# Patient Record
Sex: Female | Born: 1955
Health system: Southern US, Community
[De-identification: ages and names within clinical notes are randomized; demographics above are authoritative.]

## PROBLEM LIST (undated history)

## (undated) DIAGNOSIS — F329 Major depressive disorder, single episode, unspecified: Secondary | ICD-10-CM

## (undated) DIAGNOSIS — L821 Other seborrheic keratosis: Secondary | ICD-10-CM

## (undated) DIAGNOSIS — E119 Type 2 diabetes mellitus without complications: Secondary | ICD-10-CM

## (undated) DIAGNOSIS — J45909 Unspecified asthma, uncomplicated: Secondary | ICD-10-CM

## (undated) DIAGNOSIS — L820 Inflamed seborrheic keratosis: Secondary | ICD-10-CM

## (undated) DIAGNOSIS — R072 Precordial pain: Secondary | ICD-10-CM

## (undated) DIAGNOSIS — K759 Inflammatory liver disease, unspecified: Secondary | ICD-10-CM

## (undated) DIAGNOSIS — R0600 Dyspnea, unspecified: Secondary | ICD-10-CM

## (undated) DIAGNOSIS — G4730 Sleep apnea, unspecified: Secondary | ICD-10-CM

## (undated) DIAGNOSIS — I509 Heart failure, unspecified: Secondary | ICD-10-CM

## (undated) DIAGNOSIS — G4733 Obstructive sleep apnea (adult) (pediatric): Secondary | ICD-10-CM

## (undated) DIAGNOSIS — Z8719 Personal history of other diseases of the digestive system: Secondary | ICD-10-CM

## (undated) DIAGNOSIS — Z419 Encounter for procedure for purposes other than remedying health state, unspecified: Secondary | ICD-10-CM

## (undated) DIAGNOSIS — I428 Other cardiomyopathies: Secondary | ICD-10-CM

## (undated) DIAGNOSIS — C50219 Malignant neoplasm of upper-inner quadrant of unspecified female breast: Secondary | ICD-10-CM

## (undated) DIAGNOSIS — F32A Depression, unspecified: Secondary | ICD-10-CM

## (undated) DIAGNOSIS — F411 Generalized anxiety disorder: Secondary | ICD-10-CM

## (undated) HISTORY — DX: Generalized anxiety disorder: F41.1

## (undated) HISTORY — DX: Precordial pain: R07.2

## (undated) HISTORY — DX: Other cardiomyopathies: I42.8

## (undated) HISTORY — PX: COSMETIC SURGERY: SHX468

## (undated) HISTORY — DX: Encounter for procedure for purposes other than remedying health state, unspecified: Z41.9

## (undated) HISTORY — PX: TONSILLECTOMY: SUR1361

## (undated) HISTORY — DX: Obstructive sleep apnea (adult) (pediatric): G47.33

## (undated) HISTORY — DX: Malignant neoplasm of upper-inner quadrant of unspecified female breast: C50.219

## (undated) HISTORY — DX: Inflamed seborrheic keratosis: L82.0

## (undated) HISTORY — DX: Other seborrheic keratosis: L82.1

---
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
## 2001-03-12 DIAGNOSIS — C50219 Malignant neoplasm of upper-inner quadrant of unspecified female breast: Secondary | ICD-10-CM

## 2001-03-12 HISTORY — DX: Malignant neoplasm of upper-inner quadrant of unspecified female breast: C50.219

## 2001-03-12 HISTORY — PX: BREAST LUMPECTOMY: SHX2

## 2005-03-01 DIAGNOSIS — Z853 Personal history of malignant neoplasm of breast: Secondary | ICD-10-CM

## 2005-03-01 DIAGNOSIS — G2581 Restless legs syndrome: Secondary | ICD-10-CM

## 2005-03-01 DIAGNOSIS — F411 Generalized anxiety disorder: Secondary | ICD-10-CM | POA: Insufficient documentation

## 2005-03-01 DIAGNOSIS — Z800 Family history of malignant neoplasm of digestive organs: Secondary | ICD-10-CM

## 2005-03-01 DIAGNOSIS — K580 Irritable bowel syndrome with diarrhea: Secondary | ICD-10-CM | POA: Insufficient documentation

## 2005-03-01 DIAGNOSIS — A6000 Herpesviral infection of urogenital system, unspecified: Secondary | ICD-10-CM

## 2005-03-01 DIAGNOSIS — Z8249 Family history of ischemic heart disease and other diseases of the circulatory system: Secondary | ICD-10-CM

## 2005-03-01 DIAGNOSIS — J4520 Mild intermittent asthma, uncomplicated: Secondary | ICD-10-CM

## 2005-03-01 DIAGNOSIS — K589 Irritable bowel syndrome without diarrhea: Secondary | ICD-10-CM

## 2005-03-01 DIAGNOSIS — L719 Rosacea, unspecified: Secondary | ICD-10-CM | POA: Insufficient documentation

## 2005-03-01 HISTORY — DX: Personal history of malignant neoplasm of breast: Z85.3

## 2005-03-01 HISTORY — DX: Family history of malignant neoplasm of digestive organs: Z80.0

## 2005-03-01 HISTORY — DX: Mild intermittent asthma, uncomplicated: J45.20

## 2005-03-01 HISTORY — DX: Family history of ischemic heart disease and other diseases of the circulatory system: Z82.49

## 2005-03-01 HISTORY — DX: Herpesviral infection of urogenital system, unspecified: A60.00

## 2005-03-01 HISTORY — DX: Irritable bowel syndrome, unspecified: K58.9

## 2005-03-01 HISTORY — DX: Restless legs syndrome: G25.81

## 2005-03-01 HISTORY — DX: Generalized anxiety disorder: F41.1

## 2005-03-01 HISTORY — DX: Rosacea, unspecified: L71.9

## 2010-09-27 DIAGNOSIS — Z419 Encounter for procedure for purposes other than remedying health state, unspecified: Secondary | ICD-10-CM

## 2010-09-27 DIAGNOSIS — Z0000 Encounter for general adult medical examination without abnormal findings: Secondary | ICD-10-CM | POA: Insufficient documentation

## 2010-09-27 HISTORY — DX: Encounter for procedure for purposes other than remedying health state, unspecified: Z41.9

## 2012-08-08 DIAGNOSIS — L820 Inflamed seborrheic keratosis: Secondary | ICD-10-CM

## 2012-08-08 DIAGNOSIS — L570 Actinic keratosis: Secondary | ICD-10-CM | POA: Insufficient documentation

## 2012-08-08 HISTORY — DX: Inflamed seborrheic keratosis: L82.0

## 2012-09-22 DIAGNOSIS — Z0000 Encounter for general adult medical examination without abnormal findings: Secondary | ICD-10-CM | POA: Insufficient documentation

## 2012-09-22 HISTORY — DX: Encounter for general adult medical examination without abnormal findings: Z00.00

## 2012-11-19 DIAGNOSIS — F419 Anxiety disorder, unspecified: Secondary | ICD-10-CM | POA: Insufficient documentation

## 2012-11-19 DIAGNOSIS — F411 Generalized anxiety disorder: Secondary | ICD-10-CM

## 2012-11-19 HISTORY — DX: Generalized anxiety disorder: F41.1

## 2013-05-15 DIAGNOSIS — F988 Other specified behavioral and emotional disorders with onset usually occurring in childhood and adolescence: Secondary | ICD-10-CM | POA: Insufficient documentation

## 2013-05-15 DIAGNOSIS — F419 Anxiety disorder, unspecified: Secondary | ICD-10-CM | POA: Insufficient documentation

## 2013-05-15 HISTORY — DX: Other specified behavioral and emotional disorders with onset usually occurring in childhood and adolescence: F98.8

## 2015-01-27 DIAGNOSIS — N959 Unspecified menopausal and perimenopausal disorder: Secondary | ICD-10-CM

## 2015-01-27 DIAGNOSIS — Z780 Asymptomatic menopausal state: Secondary | ICD-10-CM | POA: Insufficient documentation

## 2015-01-27 DIAGNOSIS — M8580 Other specified disorders of bone density and structure, unspecified site: Secondary | ICD-10-CM | POA: Insufficient documentation

## 2015-01-27 DIAGNOSIS — G8929 Other chronic pain: Secondary | ICD-10-CM

## 2015-01-27 DIAGNOSIS — M545 Low back pain, unspecified: Secondary | ICD-10-CM

## 2015-01-27 DIAGNOSIS — N898 Other specified noninflammatory disorders of vagina: Secondary | ICD-10-CM

## 2015-01-27 DIAGNOSIS — E785 Hyperlipidemia, unspecified: Secondary | ICD-10-CM | POA: Diagnosis present

## 2015-01-27 HISTORY — DX: Other specified disorders of bone density and structure, unspecified site: M85.80

## 2015-01-27 HISTORY — DX: Other specified noninflammatory disorders of vagina: N89.8

## 2015-01-27 HISTORY — DX: Unspecified menopausal and perimenopausal disorder: N95.9

## 2015-01-27 HISTORY — DX: Other chronic pain: G89.29

## 2015-01-27 HISTORY — DX: Low back pain, unspecified: M54.50

## 2015-04-25 DIAGNOSIS — L4050 Arthropathic psoriasis, unspecified: Secondary | ICD-10-CM

## 2015-04-25 HISTORY — DX: Arthropathic psoriasis, unspecified: L40.50

## 2015-05-24 DIAGNOSIS — I358 Other nonrheumatic aortic valve disorders: Secondary | ICD-10-CM | POA: Insufficient documentation

## 2015-05-24 HISTORY — DX: Other nonrheumatic aortic valve disorders: I35.8

## 2015-11-01 DIAGNOSIS — I428 Other cardiomyopathies: Secondary | ICD-10-CM | POA: Insufficient documentation

## 2015-11-01 DIAGNOSIS — R079 Chest pain, unspecified: Secondary | ICD-10-CM | POA: Insufficient documentation

## 2015-11-01 DIAGNOSIS — R072 Precordial pain: Secondary | ICD-10-CM

## 2015-11-01 HISTORY — DX: Precordial pain: R07.2

## 2015-11-01 HISTORY — DX: Other cardiomyopathies: I42.8

## 2016-03-12 DIAGNOSIS — J189 Pneumonia, unspecified organism: Secondary | ICD-10-CM

## 2016-03-12 HISTORY — DX: Pneumonia, unspecified organism: J18.9

## 2016-07-13 DIAGNOSIS — I10 Essential (primary) hypertension: Secondary | ICD-10-CM | POA: Insufficient documentation

## 2016-07-13 DIAGNOSIS — I350 Nonrheumatic aortic (valve) stenosis: Secondary | ICD-10-CM

## 2016-07-13 DIAGNOSIS — E1159 Type 2 diabetes mellitus with other circulatory complications: Secondary | ICD-10-CM | POA: Insufficient documentation

## 2016-07-13 HISTORY — DX: Essential (primary) hypertension: I10

## 2016-07-13 HISTORY — DX: Nonrheumatic aortic (valve) stenosis: I35.0

## 2016-09-10 DIAGNOSIS — L821 Other seborrheic keratosis: Secondary | ICD-10-CM

## 2016-09-10 HISTORY — DX: Other seborrheic keratosis: L82.1

## 2017-11-01 ENCOUNTER — Ambulatory Visit (INDEPENDENT_AMBULATORY_CARE_PROVIDER_SITE_OTHER): Payer: BLUE CROSS/BLUE SHIELD | Admitting: Cardiology

## 2017-11-01 ENCOUNTER — Encounter: Payer: Self-pay | Admitting: Cardiology

## 2017-11-01 VITALS — BP 122/68 | HR 72 | Ht 62.0 in | Wt 139.4 lb

## 2017-11-01 DIAGNOSIS — I428 Other cardiomyopathies: Secondary | ICD-10-CM | POA: Diagnosis not present

## 2017-11-01 DIAGNOSIS — R0609 Other forms of dyspnea: Secondary | ICD-10-CM

## 2017-11-01 DIAGNOSIS — R0600 Dyspnea, unspecified: Secondary | ICD-10-CM

## 2017-11-01 DIAGNOSIS — C50219 Malignant neoplasm of upper-inner quadrant of unspecified female breast: Secondary | ICD-10-CM | POA: Insufficient documentation

## 2017-11-01 DIAGNOSIS — F411 Generalized anxiety disorder: Secondary | ICD-10-CM

## 2017-11-01 HISTORY — DX: Dyspnea, unspecified: R06.00

## 2017-11-01 HISTORY — DX: Other forms of dyspnea: R06.09

## 2017-11-01 HISTORY — DX: Malignant neoplasm of upper-inner quadrant of unspecified female breast: C50.219

## 2017-11-01 NOTE — Progress Notes (Signed)
Cardiology Consultation:    Date:  11/01/2017   ID:  Lindsay Guerrero, DOB [birth year], MRN [id number]  PCP:  Patient, No Pcp Per  Cardiologist:  Jenne Campus, MD   Referring MD: No ref. provider found   No chief complaint on file. I would like to be established as a patient  History of Present Illness:    Lindsay Guerrero is a 62 y.o. female who is being seen today for the evaluation of history of cardiomyopathy at the request of No ref. provider found.  In 2017 she started experiencing dyspnea on exertion.  She eventually ended up going to the hospital.  She was find to have significant cardiomyopathy with according to her ejection fraction close to 30%.  After that she had a cardiac catheterization cardiac catheterization showed normal coronaries with ejection fraction 40%.  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  And she told me that her ejection fraction the time was 47%.  She seems to be doing well denies have any chest pain tightness squeezing pressure burning chest.  Still gets some exertional fatigue and tiredness.  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  She works there.  Past Medical History:  Diagnosis Date  . Anxiety state 11/19/2012   ICD10  . Elective surgery for purposes other than treating health conditions 09/27/2010   ICD10  . Inflamed seborrheic keratosis 08/08/2012  . Malignant neoplasm of upper-inner quadrant of female breast ([place]) 11/01/2017   Primary  diagnosis  . NICM (nonischemic cardiomyopathy) ([place]) 11/01/2015  . Precordial pain 11/01/2015  . Seborrheic keratoses 09/10/2016    Past Surgical History:  Procedure Laterality Date  . BREAST LUMPECTOMY    . CESAREAN SECTION    . COSMETIC  SURGERY    . TONSILLECTOMY      Current Medications: Current Meds  Medication Sig  . amphetamine-dextroamphetamine (ADDERALL) 10 MG tablet Take 10 mg by mouth daily with breakfast.  . buPROPion (WELLBUTRIN XL) 300 MG 24 hr tablet Take 1 tablet by mouth daily.  . carvedilol (COREG) 12.5 MG tablet Take 1 tablet by mouth 2 (two) times daily.  . furosemide (LASIX) 20 MG tablet Take 1 tablet by mouth daily.  [name] losartan (COZAAR) 25 MG tablet Take 1 tablet by mouth 2 (two) times daily.  [name] OTEZLA 30 MG TABS Take 1 tablet by mouth 2 (two) times daily.  . potassium chloride SA (K-DUR,KLOR-CON) 20 MEQ tablet Take 1 tablet by mouth daily.  . pramipexole (MIRAPEX) 0.25 MG tablet Take 1 tablet by mouth daily.  . VENTOLIN HFA 108 (90 Base) MCG/ACT inhaler Inhale 2 puffs into the lungs daily.     Allergies:   Lisinopril; Iodine; and Metoprolol   Social History   Socioeconomic History  . Marital status: Married    Spouse name: Not on file  . Number of children: Not on file  . Years of education: Not on file  . Highest education level: Not on file  Occupational History  . Not on file  Social Needs  . Financial resource strain: Not on file  . Food insecurity:    Worry: Not on file    Inability: Not on file  . Transportation needs:  Medical: Not on file    Non-medical: Not on file  Tobacco Use  . Smoking status: Never Smoker  . Smokeless tobacco: Never Used  Substance and Sexual Activity  . Alcohol use: Not on file  . Drug use: Not on file  . Sexual activity: Not on file  Lifestyle  . Physical activity:    Days per week: Not on file    Minutes per session: Not on file  . Stress: Not on file  Relationships  . Social connections:    Talks on phone: Not on file    Gets together: Not on file    Attends religious service: Not on file    Active member of club or organization: Not on file    Attends meetings of clubs or organizations: Not on file    Relationship status: Not on file    Other Topics Concern  . Not on file  Social History Narrative  . Not on file     Family History: The patient's family history includes Heart Problems in her father and mother; Heart attack in her sister; Hypertension in her mother. ROS:   Please see the history of present illness.    All 14 point review of systems negative except as described per history of present illness.  EKGs/Labs/Other Studies Reviewed:    The following studies were reviewed today: Cardiac catheterization from 2017 to review her coronary arteries were normal  EKG:  EKG is  ordered today.  The ekg ordered today demonstrates normal sinus rhythm normal.  Interval Q waves in V1 with small R waves in V2.  Nonspecific ST segment changes  Recent Labs: No results found for requested labs within last 8760 hours.  Recent Lipid Panel No results found for: CHOL, TRIG, HDL, CHOLHDL, VLDL, LDLCALC, LDLDIRECT  Physical Exam:    VS:  BP 122/68 (BP Location: Right Arm, Patient Position: Sitting, Cuff Size: Normal)   Pulse 72   Ht 5\' 2"  (1.575 m)   Wt 139 lb 6.4 oz (63.2 kg)   SpO2 98%   BMI 25.50 kg/m     Wt Readings from Last 3 Encounters:  11/01/17 139 lb 6.4 oz (63.2 kg)     GEN:  Well nourished, well developed in no acute distress HEENT: Normal NECK: No JVD; No carotid bruits LYMPHATICS: No lymphadenopathy CARDIAC: RRR, no murmurs, no rubs, no gallops RESPIRATORY:  Clear to auscultation without rales, wheezing or rhonchi  ABDOMEN: Soft, non-tender, non-distended MUSCULOSKELETAL:  No edema; No deformity  SKIN: Warm and dry NEUROLOGIC:  Alert and oriented x 3 PSYCHIATRIC:  Normal affect   ASSESSMENT:    1. NICM (nonischemic cardiomyopathy) ([place])   2. Anxiety state   3. Dyspnea on exertion    PLAN:    In order of problems listed above:  1. History of what appears to be nonischemic cardiomyopathy as proven by cardiac catheterization done in 2017.  She does have multiple family members having coronary  artery disease however again her cardiac catheterization was normal her ejection fraction was reported to be 30% however latest echocardiogram showed improvement left ventricular ejection fraction to 47%.  She still on beta-blocker as well as ARB which I will continue for now I will ask her to have an echocardiogram done.  We will do also routine blood test.  She said that for 1-1/2-year she did not have any blood test done.  We will do batteries of test to make sure everything is fine. 2. Anxiety: That appears to  be under control. 3. Dyspnea on exertion will do echocardiogram.   Medication Adjustments/Labs and Tests Ordered: Current medicines are reviewed at length with the patient today.  Concerns regarding medicines are outlined above.  No orders of the defined types were placed in this encounter.  No orders of the defined types were placed in this encounter.   Signed, Park Liter, MD, [facility]. 11/01/2017 10:19 AM    [place]

## 2017-11-01 NOTE — Patient Instructions (Signed)
Medication Instructions:  Your physician recommends that you continue on your current medications as directed. Please refer to the Current Medication list given to you today.   Labwork: Your physician recommends that you return for lab work in: CMP, CBC, TSH and Lipids  Testing/Procedures: Your physician has requested that you have an echocardiogram. Echocardiography is a painless test that uses sound waves to create images of your heart. It provides your doctor with information about the size and shape of your heart and how well your heart's chambers and valves are working. This procedure takes approximately one hour. There are no restrictions for this procedure.    Follow-Up: Your physician recommends that you schedule a follow-up appointment in: 6 weeks   Any Other Special Instructions Will Be Listed Below (If Applicable).     If you need a refill on your cardiac medications before your next appointment, please call your pharmacy.

## 2017-11-02 LAB — LIPID PANEL
CHOLESTEROL TOTAL: 242 mg/dL — AB (ref 100–199)
Chol/HDL Ratio: 5.1 ratio — ABNORMAL HIGH (ref 0.0–4.4)
HDL: 47 mg/dL (ref >39)
LDL CALC: 160 mg/dL — AB (ref 0–99)
TRIGLYCERIDES: 175 mg/dL — AB (ref 0–149)
VLDL Cholesterol Cal: 35 mg/dL (ref 5–40)

## 2017-11-02 LAB — CBC
Hematocrit: 38 % (ref 34.0–46.6)
Hemoglobin: 12.2 g/dL (ref 11.1–15.9)
MCH: 27.5 pg (ref 26.6–33.0)
MCHC: 32.1 g/dL (ref 31.5–35.7)
MCV: 86 fL (ref 79–97)
PLATELETS: 376 10*3/uL (ref 150–450)
RBC: 4.43 x10E6/uL (ref 3.77–5.28)
RDW: 15 % (ref 12.3–15.4)
WBC: 4.6 10*3/uL (ref 3.4–10.8)

## 2017-11-02 LAB — COMPREHENSIVE METABOLIC PANEL
A/G RATIO: 1.4 (ref 1.2–2.2)
ALK PHOS: 109 IU/L (ref 39–117)
ALT: 16 IU/L (ref 0–32)
AST: 15 IU/L (ref 0–40)
Albumin: 4.6 g/dL (ref 3.6–4.8)
BUN/Creatinine Ratio: 23 (ref 12–28)
BUN: 20 mg/dL (ref 8–27)
Bilirubin Total: <0.2 mg/dL (ref 0.0–1.2)
CALCIUM: 9.9 mg/dL (ref 8.7–10.3)
CHLORIDE: 94 mmol/L — AB (ref 96–106)
CO2: 26 mmol/L (ref 20–29)
Creatinine, Ser: 0.86 mg/dL (ref 0.57–1.00)
GFR calc Af Amer: 83 mL/min/{1.73_m2} (ref >59)
GFR, EST NON AFRICAN AMERICAN: 72 mL/min/{1.73_m2} (ref >59)
Globulin, Total: 3.2 g/dL (ref 1.5–4.5)
Glucose: 126 mg/dL — ABNORMAL HIGH (ref 65–99)
Potassium: 4.8 mmol/L (ref 3.5–5.2)
SODIUM: 137 mmol/L (ref 134–144)
Total Protein: 7.8 g/dL (ref 6.0–8.5)

## 2017-11-02 LAB — TSH: TSH: 1.47 u[IU]/mL (ref 0.450–4.500)

## 2017-11-04 ENCOUNTER — Encounter (INDEPENDENT_AMBULATORY_CARE_PROVIDER_SITE_OTHER): Payer: Self-pay

## 2017-11-04 ENCOUNTER — Other Ambulatory Visit: Payer: Self-pay

## 2017-11-04 ENCOUNTER — Ambulatory Visit (INDEPENDENT_AMBULATORY_CARE_PROVIDER_SITE_OTHER): Payer: BLUE CROSS/BLUE SHIELD

## 2017-11-04 DIAGNOSIS — R0609 Other forms of dyspnea: Secondary | ICD-10-CM | POA: Diagnosis not present

## 2017-11-04 DIAGNOSIS — E7800 Pure hypercholesterolemia, unspecified: Secondary | ICD-10-CM

## 2017-11-04 DIAGNOSIS — I428 Other cardiomyopathies: Secondary | ICD-10-CM

## 2017-11-04 DIAGNOSIS — E782 Mixed hyperlipidemia: Secondary | ICD-10-CM

## 2017-11-04 NOTE — Progress Notes (Signed)
Complete echocardiogram has been performed.  Jimmy Joyelle Siedlecki RDCS 

## 2017-12-13 ENCOUNTER — Encounter: Payer: Self-pay | Admitting: Cardiology

## 2017-12-13 ENCOUNTER — Ambulatory Visit (INDEPENDENT_AMBULATORY_CARE_PROVIDER_SITE_OTHER): Payer: BLUE CROSS/BLUE SHIELD | Admitting: Cardiology

## 2017-12-13 ENCOUNTER — Ambulatory Visit: Payer: BLUE CROSS/BLUE SHIELD | Admitting: Cardiology

## 2017-12-13 ENCOUNTER — Encounter (INDEPENDENT_AMBULATORY_CARE_PROVIDER_SITE_OTHER): Payer: Self-pay

## 2017-12-13 VITALS — BP 118/68 | HR 80 | Ht 62.0 in | Wt 140.8 lb

## 2017-12-13 DIAGNOSIS — I350 Nonrheumatic aortic (valve) stenosis: Secondary | ICD-10-CM | POA: Insufficient documentation

## 2017-12-13 DIAGNOSIS — I428 Other cardiomyopathies: Secondary | ICD-10-CM

## 2017-12-13 DIAGNOSIS — I340 Nonrheumatic mitral (valve) insufficiency: Secondary | ICD-10-CM | POA: Insufficient documentation

## 2017-12-13 HISTORY — DX: Nonrheumatic mitral (valve) insufficiency: I34.0

## 2017-12-13 HISTORY — DX: Nonrheumatic aortic (valve) stenosis: I35.0

## 2017-12-13 NOTE — Patient Instructions (Signed)
Medication Instructions:  Your physician recommends that you continue on your current medications as directed. Please refer to the Current Medication list given to you today.  If you need a refill on your cardiac medications before your next appointment, please call your pharmacy.   Lab work: None.  If you have labs (blood work) drawn today and your tests are completely normal, you will receive your results only by: . MyChart Message (if you have MyChart) OR . A paper copy in the mail If you have any lab test that is abnormal or we need to change your treatment, we will call you to review the results.  Testing/Procedures: None.   Follow-Up: At [facility], you and your health needs are our priority.  As part of our continuing mission to provide you with exceptional heart care, we have created designated Provider Care Teams.  These Care Teams include your primary Cardiologist (physician) and Advanced Practice Providers (APPs -  Physician Assistants and Nurse Practitioners) who all work together to provide you with the care you need, when you need it. You will need a follow up appointment in 5 months.  Please call our office 2 months in advance to schedule this appointment.  You may see No primary care provider on file. or another member of our [facility] Provider Team in [place]: Brian Munley, MD . Rajan Revankar, MD  Any Other Special Instructions Will Be Listed Below (If Applicable).    

## 2017-12-13 NOTE — Progress Notes (Signed)
Cardiology Office Note:    Date:  12/13/2017   ID:  Lindsay Guerrero, DOB 1955/09/01, MRN [id number]  PCP:  Patient, No Pcp Per  Cardiologist:  Jenne Campus, MD    Referring MD: No ref. provider found   Chief Complaint  Patient presents with  . Follow up on Echo  Doing well  History of Present Illness:    Lindsay Guerrero is a 62 y.o. female with history of cardiomyopathy diminished ejection fraction of 40% diagnosed in 2017 at that time she had cardiac catheterization which apparently showed no coronary artery disease.  She comes today to office to discuss results of her test.  She is doing well denies have any chest pain tightness squeezing pressure burning chest.  Past Medical History:  Diagnosis Date  . Anxiety state 11/19/2012   ICD10  . Elective surgery for purposes other than treating health conditions 09/27/2010   ICD10  . Inflamed seborrheic keratosis 08/08/2012  . Malignant neoplasm of upper-inner quadrant of female breast ([place]) 11/01/2017   Primary  diagnosis  . NICM (nonischemic cardiomyopathy) ([place]) 11/01/2015  . Precordial pain 11/01/2015  . Seborrheic keratoses 09/10/2016    Past Surgical History:  Procedure Laterality Date  . BREAST LUMPECTOMY    . CESAREAN SECTION    . COSMETIC SURGERY    . TONSILLECTOMY      Current Medications: Current Meds  Medication Sig  . amphetamine-dextroamphetamine (ADDERALL) 10 MG tablet Take 10 mg by mouth daily with breakfast.  . buPROPion (WELLBUTRIN XL) 300 MG 24 hr tablet Take 1 tablet by mouth daily.  . carvedilol (COREG) 12.5 MG tablet Take 1 tablet by mouth 2 (two) times daily.  . furosemide (LASIX) 20 MG tablet Take 1 tablet by mouth daily.  [name] losartan (COZAAR) 25 MG tablet Take 1 tablet by mouth 2 (two) times daily.  . ondansetron (ZOFRAN-ODT) 8 MG disintegrating tablet Take 1 tablet by mouth as needed.  [name] OTEZLA 30 MG TABS Take 1 tablet by mouth 2 (two) times daily.  . potassium chloride SA (K-DUR,KLOR-CON) 20 MEQ tablet  Take 1 tablet by mouth daily.  . pramipexole (MIRAPEX) 0.25 MG tablet Take 1 tablet by mouth daily.  . VENTOLIN HFA 108 (90 Base) MCG/ACT inhaler Inhale 2 puffs into the lungs daily.     Allergies:   Lisinopril; Iodine; and Metoprolol   Social History   Socioeconomic History  . Marital status: Married    Spouse name: Not on file  . Number of children: Not on file  . Years of education: Not on file  . Highest education level: Not on file  Occupational History  . Not on file  Social Needs  . Financial resource strain: Not on file  . Food insecurity:    Worry: Not on file    Inability: Not on file  . Transportation needs:    Medical: Not on file    Non-medical: Not on file  Tobacco Use  . Smoking status: Never Smoker  . Smokeless tobacco: Never Used  Substance and Sexual Activity  . Alcohol use: Not on file  . Drug use: Not on file  . Sexual activity: Not on file  Lifestyle  . Physical activity:    Days per week: Not on file    Minutes per session: Not on file  . Stress: Not on file  Relationships  . Social connections:    Talks on phone: Not on file    Gets together: Not on file    Attends religious  service: Not on file    Active member of club or organization: Not on file    Attends meetings of clubs or organizations: Not on file    Relationship status: Not on file  Other Topics Concern  . Not on file  Social History Narrative  . Not on file     Family History: The patient's family history includes Heart Problems in her father and mother; Heart attack in her sister; Hypertension in her mother. ROS:   Please see the history of present illness.    All 14 point review of systems negative except as described per history of present illness  EKGs/Labs/Other Studies Reviewed:   Echocardiogram from 11/04/2017 - Left ventricle: The cavity size was normal. Wall thickness was   normal. Systolic function was normal. The estimated ejection   fraction was in the range of  55% to 60%. Wall motion was normal;   there were no regional wall motion abnormalities. Doppler   parameters are consistent with abnormal left ventricular   relaxation (grade 1 diastolic dysfunction). - Aortic valve: Trileaflet; mildly thickened, mildly calcified   leaflets. Cusp separation was mildly reduced. Valve mobility was   restricted. There was moderate stenosis. There was mild   regurgitation. Valve area (VTI): 0.85 cm^2. Valve area (Vmax):   0.88 cm^2. Valve area (Vmean): 0.8 cm^2. - Mitral valve: There was mild to moderate regurgitation. - Left atrium: The atrium was mildly dilated.  Recent Labs: 11/01/2017: ALT 16; BUN 20; Creatinine, Ser 0.86; Hemoglobin 12.2; Platelets 376; Potassium 4.8; Sodium 137; TSH 1.470  Recent Lipid Panel    Component Value Date/Time   CHOL 242 (H) 11/01/2017 1030   TRIG 175 (H) 11/01/2017 1030   HDL 47 11/01/2017 1030   CHOLHDL 5.1 (H) 11/01/2017 1030   LDLCALC 160 (H) 11/01/2017 1030    Physical Exam:    VS:  BP 118/68   Pulse 80   Ht 5\' 2"  (1.575 m)   Wt 140 lb 12.8 oz (63.9 kg)   SpO2 98%   BMI 25.75 kg/m     Wt Readings from Last 3 Encounters:  12/13/17 140 lb 12.8 oz (63.9 kg)  11/01/17 139 lb 6.4 oz (63.2 kg)     GEN:  Well nourished, well developed in no acute distress HEENT: Normal NECK: No JVD; No carotid bruits LYMPHATICS: No lymphadenopathy CARDIAC: RRR, systolic ejection murmur grade 2/6 best heard at the right upper portion of the sternum., no rubs, no gallops RESPIRATORY:  Clear to auscultation without rales, wheezing or rhonchi  ABDOMEN: Soft, non-tender, non-distended MUSCULOSKELETAL:  No edema; No deformity  SKIN: Warm and dry LOWER EXTREMITIES: no swelling NEUROLOGIC:  Alert and oriented x 3 PSYCHIATRIC:  Normal affect   ASSESSMENT:    1. NICM (nonischemic cardiomyopathy) ([place])   2. Nonrheumatic aortic valve stenosis   3. Nonrheumatic mitral valve regurgitation    PLAN:    In order of problems listed  above:  1. Nonischemic cardiomyopathy ejection fraction normal by echocardiogram done recently.  We will continue present management. 2. Nonrheumatic aortic valve stenosis which appears to be moderate.  Asymptomatic we discussed this issue I told her we do not do anything about the valve until became critical I warned her about signs and symptoms of critical aortic stenosis.  She will require yearly echocardiogram. 3. Mitral regurgitation.  Mild to moderate.  We will continue monitoring. 4. Dyslipidemia her last LDL was 160 I told him hypertension to be taken statin however she refused she  does not want to take any statin, she does not want to take any medication for cholesterol.   Medication Adjustments/Labs and Tests Ordered: Current medicines are reviewed at length with the patient today.  Concerns regarding medicines are outlined above.  No orders of the defined types were placed in this encounter.  Medication changes: No orders of the defined types were placed in this encounter.   Signed, Park Liter, MD, [facility] 12/13/2017 12:20 PM    [facility]

## 2018-06-28 ENCOUNTER — Observation Stay (HOSPITAL_COMMUNITY)
Admission: AD | Admit: 2018-06-28 | Discharge: 2018-06-30 | Disposition: A | Discharge status: Home / Self Care | Payer: Commercial Managed Care - PPO | Source: Other Acute Inpatient Hospital | Attending: Internal Medicine | Admitting: Internal Medicine

## 2018-06-28 DIAGNOSIS — Z7901 Long term (current) use of anticoagulants: Secondary | ICD-10-CM | POA: Insufficient documentation

## 2018-06-28 DIAGNOSIS — I350 Nonrheumatic aortic (valve) stenosis: Secondary | ICD-10-CM | POA: Diagnosis not present

## 2018-06-28 DIAGNOSIS — Z923 Personal history of irradiation: Secondary | ICD-10-CM | POA: Insufficient documentation

## 2018-06-28 DIAGNOSIS — Z888 Allergy status to other drugs, medicaments and biological substances status: Secondary | ICD-10-CM | POA: Diagnosis not present

## 2018-06-28 DIAGNOSIS — E119 Type 2 diabetes mellitus without complications: Secondary | ICD-10-CM | POA: Insufficient documentation

## 2018-06-28 DIAGNOSIS — Z7984 Long term (current) use of oral hypoglycemic drugs: Secondary | ICD-10-CM | POA: Diagnosis not present

## 2018-06-28 DIAGNOSIS — I428 Other cardiomyopathies: Secondary | ICD-10-CM | POA: Insufficient documentation

## 2018-06-28 DIAGNOSIS — I4891 Unspecified atrial fibrillation: Secondary | ICD-10-CM | POA: Diagnosis present

## 2018-06-28 DIAGNOSIS — Z8249 Family history of ischemic heart disease and other diseases of the circulatory system: Secondary | ICD-10-CM | POA: Insufficient documentation

## 2018-06-28 DIAGNOSIS — F988 Other specified behavioral and emotional disorders with onset usually occurring in childhood and adolescence: Secondary | ICD-10-CM | POA: Diagnosis not present

## 2018-06-28 DIAGNOSIS — I10 Essential (primary) hypertension: Secondary | ICD-10-CM | POA: Diagnosis present

## 2018-06-28 DIAGNOSIS — R0602 Shortness of breath: Secondary | ICD-10-CM | POA: Diagnosis not present

## 2018-06-28 DIAGNOSIS — R509 Fever, unspecified: Secondary | ICD-10-CM | POA: Diagnosis not present

## 2018-06-28 DIAGNOSIS — I480 Paroxysmal atrial fibrillation: Secondary | ICD-10-CM | POA: Diagnosis not present

## 2018-06-28 DIAGNOSIS — Z79899 Other long term (current) drug therapy: Secondary | ICD-10-CM | POA: Insufficient documentation

## 2018-06-28 DIAGNOSIS — E785 Hyperlipidemia, unspecified: Secondary | ICD-10-CM | POA: Diagnosis not present

## 2018-06-28 DIAGNOSIS — R002 Palpitations: Secondary | ICD-10-CM

## 2018-06-28 DIAGNOSIS — R05 Cough: Secondary | ICD-10-CM | POA: Diagnosis not present

## 2018-06-28 DIAGNOSIS — Z853 Personal history of malignant neoplasm of breast: Secondary | ICD-10-CM | POA: Diagnosis not present

## 2018-06-28 DIAGNOSIS — E876 Hypokalemia: Secondary | ICD-10-CM | POA: Diagnosis present

## 2018-06-28 DIAGNOSIS — Z20828 Contact with and (suspected) exposure to other viral communicable diseases: Secondary | ICD-10-CM | POA: Diagnosis not present

## 2018-06-28 DIAGNOSIS — Z9221 Personal history of antineoplastic chemotherapy: Secondary | ICD-10-CM | POA: Insufficient documentation

## 2018-06-28 DIAGNOSIS — E1159 Type 2 diabetes mellitus with other circulatory complications: Secondary | ICD-10-CM | POA: Diagnosis present

## 2018-06-28 HISTORY — DX: Unspecified atrial fibrillation: I48.91

## 2018-06-28 MED ORDER — CARVEDILOL 12.5 MG PO TABS: 12.5000 mg | ORAL_TABLET | Freq: Two times a day (BID) | ORAL | Status: DC

## 2018-06-28 MED ORDER — CARVEDILOL 25 MG PO TABS
25.0000 mg | ORAL_TABLET | Freq: Two times a day (BID) | ORAL | Status: DC
  Administered 2018-06-28 – 2018-06-30 (×4): 25 mg via ORAL
  Filled 2018-06-28 (×4): qty 1

## 2018-06-28 MED ORDER — BENZONATATE 100 MG PO CAPS
100.0000 mg | ORAL_CAPSULE | Freq: Three times a day (TID) | ORAL | Status: DC | PRN
  Administered 2018-06-28 – 2018-06-30 (×3): 100 mg via ORAL
  Filled 2018-06-28 (×3): qty 1

## 2018-06-28 MED ORDER — ENOXAPARIN SODIUM 60 MG/0.6ML ~~LOC~~ SOLN
1.0000 mg/kg | Freq: Two times a day (BID) | SUBCUTANEOUS | Status: DC
  Administered 2018-06-28: 60 mg via SUBCUTANEOUS
  Filled 2018-06-28: qty 0.6

## 2018-06-28 MED ORDER — SODIUM CHLORIDE 0.9% FLUSH
3.0000 mL | Freq: Two times a day (BID) | INTRAVENOUS | Status: DC
  Administered 2018-06-28 – 2018-06-30 (×3): 3 mL via INTRAVENOUS

## 2018-06-28 NOTE — H&P (Addendum)
History and Physical   Dakari Stabler KGM:[id number] DOB: 08-16-1955 DOA: 06/28/2018  PCP: Patient, No Pcp Per  History obtained from patient, review of outside records, and patient provided history  Chief Complaint: Fast heart rate  HPI: This is a 63 year old woman with medical problems including prior early stage left-sided breast cancer status post left anthracycline chemotherapy and radiation, non-insulin dependent DM, nonischemic cardiomyopathy with historically EF of 40% that has since recovered, moderate aortic stenosis, HTN, mild to moderate mitral regurgitation, and psychostimulant for ADD who presented to her local [facility]) with a complaint of palpitations and fast heart rate.  Roughly 6 days prior to admission she developed a cough as well as fever to 100.7 F.  She works as a Agricultural consultant, she was tested for COVID-19 was negative.  She was placed on corticosteroids as well as fluoroquinolone therapy.  She reports the evening prior to admission she developed palpitations and her heart rate monitoring device showed heart rate in the 160s.  Associated symptoms include right neck tightness, dyspnea.  She reports using Afrin nose spray constantly, no overt postnasal drip.  She reports nausea.  She denies overt chest pain, hematemesis, diarrhea, emesis, dysuria, syncope.  Echocardiogram and August 2018 remarkable for LVEF 55 to 64%, grade 1 diastolic dysfunction.  Moderate aortic stenosis.  Patient reports undergoing a left heart cath in 2017 which revealed normal coronaries, no significant coronary disease.  Course at outside medical center on 06/28/2018: Vital signs reviewed.  Remarkable for at 10:44 AM -heart rate 135, SBP 105, DBP 53.  Vitals at 1503-heart rate 86, BP 92/55. Lab work remarkable for CBC with platelet of 401,000, otherwise normal.  INR 1.  D-dimer within normal limits. NT-BNP 1550 (nl 0-900 pg/mL).  Hemoglobin A1c 6.9%.  Procalcitonin  undetectable.  LDH within normal limits.  CRP within normal limits.  Urinalysis bland.  Troponin within normal limits.  Chest x-ray did not reveal acute findings. Occasions given included: 10 mg IV diltiazem at 10:58 AM. 25 mg of IV diltiazem at 11 AM. -Started on a diltiazem drip. -Lorazepam 1 mg Patient was was evaluated by the medicine team and due to her prior respiratory symptoms deemed to be a person under investigation and warranted transfer to [facility] for further evaluation and management.   Review of Systems: A complete ROS was obtained; pertinent positives negatives are denoted in the HPI. Otherwise, all systems are negative.   Past Medical History:  Diagnosis Date  . Anxiety state 11/19/2012   ICD10  . Elective surgery for purposes other than treating health conditions 09/27/2010   ICD10  . Inflamed seborrheic keratosis 08/08/2012  . Malignant neoplasm of upper-inner quadrant of female breast ([place]) 11/01/2017   Primary  diagnosis  . NICM (nonischemic cardiomyopathy) ([place]) 11/01/2015  . Precordial pain 11/01/2015  . Seborrheic keratoses 09/10/2016   Social History   Socioeconomic History  . Marital status: Married    Spouse name: Not on file  . Number of children: Not on file  . Years of education: Not on file  . Highest education level: Not on file  Occupational History  . Not on file  Social Needs  . Financial resource strain: Not on file  . Food insecurity:    Worry: Not on file    Inability: Not on file  . Transportation needs:    Medical: Not on file    Non-medical: Not on file  Tobacco Use  . Smoking status: Never Smoker  . Smokeless  tobacco: Never Used  Substance and Sexual Activity  . Alcohol use: Not on file  . Drug use: Not on file  . Sexual activity: Not on file  Lifestyle  . Physical activity:    Days per week: Not on file    Minutes per session: Not on file  . Stress: Not on file  Relationships  . Social connections:    Talks on phone: Not on  file    Gets together: Not on file    Attends religious service: Not on file    Active member of club or organization: Not on file    Attends meetings of clubs or organizations: Not on file    Relationship status: Not on file  . Intimate partner violence:    Fear of current or ex partner: Not on file    Emotionally abused: Not on file    Physically abused: Not on file    Forced sexual activity: Not on file  Other Topics Concern  . Not on file  Social History Narrative  . Not on file   She lives with her husband, husband travels frequently internationally.  Works as a Agricultural consultant.  Never smoker.  Denies regular alcohol intake.  Family History  Problem Relation Age of Onset  . Heart Problems Mother   . Hypertension Mother   . Heart Problems Father   . Heart attack Sister    Father died of a myocardial infarction in his 75s.  Mother died in her 12s of coronary artery disease.  However, they were smokers.  Physical Exam: Vitals:   06/28/18 1900  Pulse: 88  Resp: 18  SpO2: 98%    General: Appears calm and comfortable, well-appearing white woman. ENT: Grossly normal hearing, MMM.  No epistaxis. Cardiovascular: S1-S2 unremarkable with a regular rate and rhythm.  Soft 2 out of 6 holosystolic murmur right upper sternal border.  Heart sounds distant.  No lower extremity edema. Respiratory: Breathing room air, not conversationally dyspneic, no overt crackles, faint scattered wheeze present Chest: Left breast prior lumpectomy scar present Abdomen: Soft, non-tender. Skin: No rash or induration seen on limited exam. Musculoskeletal: Grossly normal tone BUE/BLE. Appropriate ROM.  Sits up without assistance. Psychiatric: Grossly normal mood and affect.  Neurologic: Moves all extremities in coordinated fashion.  I have personally reviewed the following labs, culture data, and imaging studies.  Assessment/Plan:  #Atrial fibrillation with RVR, new onset (paroxysmal) Course: First time  event per patient report, onset of symptoms on 06/27/2018 evening (palpitations, HR 160-170s).  Has not taken psychostimulant (Adderall in > 1 week). Associated with right neck tightness. At outside hospital EKG with AF RVR rate of 171.  Given diltiazem 10 mg IV, followed by 25 mg IV, followed by diltiazem gtt; her SBP was 90-100. Confirmed that she had what sounds like generalized erythema with metoprolol. A/P: Currently appears to be in NSR and is off diltiazem gtt. Potential contributing causes may include valvular disease (aortic stenosis), mildly elevated free T3 is of uncertain significance (repeating), normal D-Dimer makes pulmonary embolus less likely.  Will monitor with telemetry, increase her carvedilol from 12.5 mg BID to 25 mg BID in hopes of prevention of AF RVR recurrence.  Will reserve re-initiation of diltiazem if rate is not controlled. Has CHA2DS2VASc score of at least 3 (HTN, female sex, DM).  Will initiate therapeutic AC with BID LMWH given cannot rule out need for cardioversion pending clinical course. Decisions regarding longer term anticoagulation can be made further during hospital stay.  Consider cardiology consultation pending results of diagnostics and clinical course. UDS to exclude additional causes of AF.  #Other problems: -History of non-ischemic cardiomyopathy: hold home furosemide (20 mg daily) given "euvolemic" at this time, continue BB as above -Non-insulin dependent DM: A1c of 6.9%, on SGLT2 inhibitor, hold for now, if hyperglycemia noted, institute rapid acting corrective factor insulin to optimize -ADD: hold psychostimulant (Adderrall) -Aortic stenosis: repeat TTE, does not report classic progressive symptoms (light headedness, CP, or DOE) -Cough and fever within the past week: no more fever (TMAX of 100.9F on ~06/23/2018), COVID-19 testing at that time negative; will obtain repeat COVID-19 testing as well as RVP to further evaluate, anti-tussive therapy for cough;  although faint scattered wheeze - not dyspneic and on RA, thus holding beta-agonists for now in event they can / or have contributed to AF RVR -Dyslipidemia: noted in prior cardiology note, LDL of 160, refused statin at that time  DVT prophylaxis: Subq Lovenox - therapeutic Code Status:  Full code Disposition Plan: Anticipate D/C home when clinically stable Consults called:  None Admission status: Admit to hospital medicine service, telemetry level of care   Cheri Rous, MD [facility] DQQI:[phone]  If 7PM-7AM, please contact night-coverage [email or web address] Password [email or web address]  This document was created using the aid of voice recognition / dication software.[name]

## 2018-06-29 ENCOUNTER — Encounter (HOSPITAL_COMMUNITY): Payer: Self-pay

## 2018-06-29 ENCOUNTER — Observation Stay (HOSPITAL_BASED_OUTPATIENT_CLINIC_OR_DEPARTMENT_OTHER): Payer: Commercial Managed Care - PPO

## 2018-06-29 ENCOUNTER — Other Ambulatory Visit: Payer: Self-pay

## 2018-06-29 DIAGNOSIS — Z8679 Personal history of other diseases of the circulatory system: Secondary | ICD-10-CM | POA: Diagnosis not present

## 2018-06-29 DIAGNOSIS — I361 Nonrheumatic tricuspid (valve) insufficiency: Secondary | ICD-10-CM

## 2018-06-29 DIAGNOSIS — I350 Nonrheumatic aortic (valve) stenosis: Secondary | ICD-10-CM | POA: Diagnosis not present

## 2018-06-29 DIAGNOSIS — I480 Paroxysmal atrial fibrillation: Secondary | ICD-10-CM | POA: Diagnosis not present

## 2018-06-29 DIAGNOSIS — E876 Hypokalemia: Secondary | ICD-10-CM | POA: Diagnosis not present

## 2018-06-29 DIAGNOSIS — E785 Hyperlipidemia, unspecified: Secondary | ICD-10-CM | POA: Diagnosis not present

## 2018-06-29 DIAGNOSIS — I10 Essential (primary) hypertension: Secondary | ICD-10-CM | POA: Diagnosis not present

## 2018-06-29 DIAGNOSIS — I351 Nonrheumatic aortic (valve) insufficiency: Secondary | ICD-10-CM

## 2018-06-29 LAB — COMPREHENSIVE METABOLIC PANEL
ALT: 14 U/L (ref 0–44)
AST: 13 U/L — ABNORMAL LOW (ref 15–41)
Albumin: 3.3 g/dL — ABNORMAL LOW (ref 3.5–5.0)
Alkaline Phosphatase: 83 U/L (ref 38–126)
Anion gap: 10 (ref 5–15)
BUN: 22 mg/dL (ref 8–23)
CO2: 23 mmol/L (ref 22–32)
Calcium: 8.2 mg/dL — ABNORMAL LOW (ref 8.9–10.3)
Chloride: 103 mmol/L (ref 98–111)
Creatinine, Ser: 0.68 mg/dL (ref 0.44–1.00)
GFR calc Af Amer: >60 mL/min (ref >60)
GFR calc non Af Amer: >60 mL/min (ref >60)
Glucose, Bld: 149 mg/dL — ABNORMAL HIGH (ref 70–99)
Potassium: 3.2 mmol/L — ABNORMAL LOW (ref 3.5–5.1)
Sodium: 136 mmol/L (ref 135–145)
Total Bilirubin: 0.5 mg/dL (ref 0.3–1.2)
Total Protein: 6.5 g/dL (ref 6.5–8.1)

## 2018-06-29 LAB — GLUCOSE, CAPILLARY
Glucose-Capillary: 114 mg/dL — ABNORMAL HIGH (ref 70–99)
Glucose-Capillary: 129 mg/dL — ABNORMAL HIGH (ref 70–99)

## 2018-06-29 LAB — RESPIRATORY PANEL BY PCR

## 2018-06-29 LAB — CBC
HCT: 39.8 % (ref 36.0–46.0)
Hemoglobin: 12.7 g/dL (ref 12.0–15.0)
MCH: 27 pg (ref 26.0–34.0)
MCHC: 31.9 g/dL (ref 30.0–36.0)
MCV: 84.7 fL (ref 80.0–100.0)
Platelets: 374 10*3/uL (ref 150–400)
RBC: 4.7 MIL/uL (ref 3.87–5.11)
RDW: 15.3 % (ref 11.5–15.5)
WBC: 7.9 10*3/uL (ref 4.0–10.5)
nRBC: 0 % (ref 0.0–0.2)

## 2018-06-29 LAB — T4, FREE: Free T4: 1.02 ng/dL (ref 0.82–1.77)

## 2018-06-29 LAB — SARS CORONAVIRUS 2 BY RT PCR (HOSPITAL ORDER, PERFORMED IN ~~LOC~~ HOSPITAL LAB): SARS Coronavirus 2: NEGATIVE

## 2018-06-29 LAB — HIV ANTIBODY (ROUTINE TESTING W REFLEX): HIV Screen 4th Generation wRfx: NONREACTIVE

## 2018-06-29 LAB — TSH: TSH: 1.571 u[IU]/mL (ref 0.350–4.500)

## 2018-06-29 LAB — ABO/RH: ABO/RH(D): A POS

## 2018-06-29 MED ORDER — POTASSIUM CHLORIDE CRYS ER 20 MEQ PO TBCR
40.0000 meq | EXTENDED_RELEASE_TABLET | ORAL | Status: AC
  Administered 2018-06-29: 40 meq via ORAL
  Filled 2018-06-29: qty 2

## 2018-06-29 MED ORDER — INSULIN ASPART 100 UNIT/ML ~~LOC~~ SOLN: 0.0000 [IU] | Freq: Three times a day (TID) | SUBCUTANEOUS | Status: DC

## 2018-06-29 MED ORDER — APIXABAN 5 MG PO TABS
5.0000 mg | ORAL_TABLET | Freq: Two times a day (BID) | ORAL | Status: DC
  Administered 2018-06-29 – 2018-06-30 (×3): 5 mg via ORAL
  Filled 2018-06-29 (×3): qty 1

## 2018-06-29 MED ORDER — ALUM & MAG HYDROXIDE-SIMETH 200-200-20 MG/5ML PO SUSP
30.0000 mL | ORAL | Status: DC | PRN
  Administered 2018-06-29 – 2018-06-30 (×3): 30 mL via ORAL
  Filled 2018-06-29 (×3): qty 30

## 2018-06-29 NOTE — Care Management (Addendum)
11:30- Spoke w patient who states that she has Pharmacist, community, [employer] Instructed her to call back to hospital main line [phone] Monday to make sure her info is on file.  Notified MD that she has Pharmacist, community and she can have both the 30 day and copay reduction coupons for either DOAC chosen. These can be provided today. MD awaiting echo.  12:15 MD notified CM that patient will DC w Eliquis. Sent secure chat to [place] and Tomi Bamberger RN CM who will be following patient tomorrow.

## 2018-06-29 NOTE — Progress Notes (Signed)
Report called to  [place]. They are awaiting bed for that room, will transfer when ready. Pt informed of transfer and plan of care.   Loel Dubonnet, RN

## 2018-06-29 NOTE — Progress Notes (Signed)
ANTICOAGULATION CONSULT NOTE - Initial Consult  Pharmacy Consult for Apixaban Indication: atrial fibrillation  Allergies  Allergen Reactions  . Lisinopril Cough  . Iodine Rash  . Metoprolol Rash    Patient Measurements: Height: 5\' 2"  (157.5 cm) Weight: 129 lb (58.5 kg) IBW/kg (Calculated) : 50.1  Vital Signs: Temp: 98.9 F (37.2 C) (04/19 0400) Temp Source: Oral (04/19 0400) BP: 100/75 (04/19 1222) Pulse Rate: 84 (04/19 1222)  Labs: Recent Labs    06/29/18 0827  HGB 12.7  HCT 39.8  PLT 374  CREATININE 0.68    Estimated Creatinine Clearance: 56.2 mL/min (by C-G formula based on SCr of 0.68 mg/dL).   Medical History: Past Medical History:  Diagnosis Date  . Anxiety state 11/19/2012   ICD10  . Elective surgery for purposes other than treating health conditions 09/27/2010   ICD10  . Inflamed seborrheic keratosis 08/08/2012  . Malignant neoplasm of upper-inner quadrant of female breast ([place]) 11/01/2017   Primary  diagnosis  . NICM (nonischemic cardiomyopathy) ([place]) 11/01/2015  . Precordial pain 11/01/2015  . Seborrheic keratoses 09/10/2016   Assessment: 63 yo female admitted with new onset a.fib/  Had been started on Lovenox 60 q12hr. Pharmacy is being consulted to transition to apixaban.  Plan:  D/c Lovenox Start apixaban 5 mg po bid  Alanda Slim, PharmD, [facility] Clinical Pharmacist Please see AMION for all Pharmacists' Contact Phone Numbers 06/29/2018, 12:47 PM

## 2018-06-29 NOTE — Progress Notes (Signed)
COVID test negative. Paged Dr. Tamala Julian. Given verbal order to discontinue precautions. Will transfer patient if there is no plan to discharge, evaluate once seen by all teams.   Loel Dubonnet, RN

## 2018-06-29 NOTE — Progress Notes (Signed)
Progress Note  Patient Name: Lindsay Guerrero Date of Encounter: 06/29/2018  Primary Cardiologist: Dr. Jenne Campus  Subjective   No chest pain, shortness of breath, or palpitations this morning.  Intermittent cough.  No wheezing.  Inpatient Medications    Scheduled Meds: . carvedilol  25 mg Oral BID WC  . enoxaparin (LOVENOX) injection  1 mg/kg Subcutaneous Q12H  . sodium chloride flush  3 mL Intravenous Q12H    PRN Meds: benzonatate   Vital Signs    Vitals:   06/29/18 0500 06/29/18 0600 06/29/18 0700 06/29/18 0753  BP:    105/70  Pulse: 72 70 87 77  Resp: 16 15 (!) 21 18  Temp:      TempSrc:      SpO2: 97% 96% 98% 97%  Weight:      Height:        Intake/Output Summary (Last 24 hours) at 06/29/2018 0912 Last data filed at 06/28/2018 2100 Gross per 24 hour  Intake 240 ml  Output -  Net 240 ml   Filed Weights   06/28/18 2100  Weight: 58.5 kg    Telemetry    Normal sinus rhythm with occasional PACs.  Brief burst of SVT versus AF.  Personally reviewed.  ECG    Tracing from 06/29/2018 shows normal sinus rhythm with PAC, nonspecific T wave changes.  Personally reviewed.  Physical Exam   GEN: No acute distress.   Neck: No JVD. Cardiac: RRR, 2/6 systolic murmur,no gallop.  Respiratory: Nonlabored.  No wheezing or rhonchi. GI: Soft, nontender, bowel sounds present. MS: No edema; No deformity. Neuro:  Nonfocal. Psych: Alert and oriented x 3. Normal affect.  Labs    COVID-19 negative  Lab work from [facility] 06/28/2018: WBC 9.8, hemoglobin 14.6, platelets 401, INR 1.0, d-dimer 470, TSH 4.46, potassium 4.0, BUN 35, creatinine 0.9, troponin I 0.03, NT-proBNP 1550, AST 25, ALT 17, hemoglobin A1c 6.9%, pro calcitonin less than 0.05, LDH 452, CRP less than 5  Radiology    Chest x-ray from [facility] 06/28/2018: No active disease  Cardiac Studies   Echocardiogram 11/04/2017: Study Conclusions  - Left ventricle: The cavity size was normal.  Wall thickness was   normal. Systolic function was normal. The estimated ejection   fraction was in the range of 55% to 60%. Wall motion was normal;   there were no regional wall motion abnormalities. Doppler   parameters are consistent with abnormal left ventricular   relaxation (grade 1 diastolic dysfunction). - Aortic valve: Trileaflet; mildly thickened, mildly calcified   leaflets. Cusp separation was mildly reduced. Valve mobility was   restricted. There was moderate stenosis. There was mild   regurgitation. Valve area (VTI): 0.85 cm^2. Valve area (Vmax):   0.88 cm^2. Valve area (Vmean): 0.8 cm^2. - Mitral valve: There was mild to moderate regurgitation. - Left atrium: The atrium was mildly dilated.  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  She presents with recent URI symptoms with negative COVID-19 screen, found to be in newly documented rapid atrial fibrillation at [facility] and seen in consultation by Dr. Bettina Gavia (note in hard chart).  She was treated with intravenous diltiazem and spontaneously converted to sinus rhythm  Transfer was arranged for further evaluation and repeat COVID-19 assessment.  Assessment & Plan    1.  Newly documented rapid atrial fibrillation,  spontaneously converted to sinus rhythm with rate control on intravenous diltiazem (now discontinued).  She remains in sinus rhythm this morning by telemetry and follow-up ECG.  No active palpitations.  No clear evidence of ACS by recent cardiac enzymes.  TSH normal.  CHADSVASC score is 2 based on current information.  2.  History of nonischemic cardiomyopathy with normal coronary arteries as of 2017.  Echocardiogram from August 2019 reported LVEF 55 to 60%.  I am uncertain whether she has had a follow-up study in [place].  Cardiac regimen per assessment  by Dr. Agustin Cree in October 2019 included Coreg, Cozaar, Lasix, and potassium supplements.  3.  URI symptoms, follow-up COVID-19 screen is negative at this facility.  4.  Aortic stenosis, moderate range by echocardiogram in August 2019.  I reviewed the patient's records including recent consultation by Dr. Bettina Gavia and work-up at [facility] ER.  Patient states that she feels at baseline this morning.  She is afebrile and follow-up COVID-19 screen is negative.  She is maintaining sinus rhythm at this time.  Follow-up echocardiogram has been ordered, would proceed with this testing as it may help to clarify further medication adjustments.  Unless aortic stenosis has progressed substantially or there has been significant decline in LVEF to require further inpatient work-up (if this is the case would discuss with cardiology team on call today), she may be able to be considered for further outpatient work-up and early discharge. Anticoagulation would be recommended with CHADSVASC score of 2, consider Eliquis or Xarelto at discharge.  If she does go home would continue Coreg, may be able to use Lasix and potassium supplements as needed if her LVEF is in fact normal range at this time.  With low normal to mildly reduced systolic blood pressures would also hold Cozaar for the time being until she has outpatient follow-up.  Whether or not she will need to be considered for antiarrhythmic therapy for better management of paroxysmal atrial fibrillation will need to be considered as well.  She will need to have close follow-up with her primary cardiologist Dr. Agustin Cree.  Signed, Rozann Lesches, MD  06/29/2018, 9:12 AM

## 2018-06-29 NOTE — Plan of Care (Signed)
Pt stable upon admission, VSS, afebrile. Will continue to monitor.

## 2018-06-29 NOTE — Progress Notes (Signed)
  Echocardiogram 2D Echocardiogram has been performed.  Kayd Launer L Androw 06/29/2018, 11:18 AM

## 2018-06-29 NOTE — Progress Notes (Signed)
Progress Note    Lindsay Guerrero  TIW:[id number] DOB: 03/04/1956  DOA: 06/28/2018 PCP: Patient, No Pcp Per    Brief Narrative:   Chief complaint: Palpitations  Medical records reviewed and are as summarized below:  Lindsay Guerrero is an 63 y.o. female breast cancer status post chemotherapy and radiation, non-insulin-dependent DM, NICM, HTN, moderate MR, and ADD; who presents as a transfer from [facility] with complaints of palpitations.  Found to be in atrial fibrillation.  Assessment/Plan:   Paroxysmal atrial fibrillation: Patient converted to sinus rhythm after being given intravenous Cardizem.  Currently in sinus rhythm.  Denies any complaints of chest pain.  Thyroid studies within normal limits.  Home Coreg dose previously was 12.5 mg twice daily. CHA2DS2-VASc score = at least 3(DM, sex, HTN) -Continue increased dose of Coreg 25 mg twice daily -Correct electrolyte abnormalities -Start Eliquis -Will have medications obtained through transitions of care in a.m.  Hypokalemia: Acute.  Potassium noted to be 3.2. -Give 40 mEq of potassium chloride x1 dose now -Goal potassium 4  Essential hypertension: Blood pressures lower than normal at this time and 99/82-105/70 -Continue increased dose of Coreg -Hold losartan and furosemide  History of nonischemic cardiomyopathy, aortic stenosis: Echocardiogram notes EF to be 60 to 65% with moderate to severe aortic stenosis.  Currently followed Dr. Agustin Cree in the outpatient setting. -Recommend follow-up at discharge -Consider changing furosemide to as needed recommending regular monitoring weight  Fever and cough: Patient had been noted to have low-grade fever up to 100.7 F with cough.  COVID-19 previously reported to be negative on 4/13 was rechecked along with respiratory virus panel. -Follow-up respiratory virus panel  Non-insulin-dependent diabetes mellitus type 2: Last hemoglobin A1c 6.9.  Patient on SLGT -Hypoglycemic  protocols -CBGs q. before meals and at bedtime with sensitive SSI  Dyslipidemia: Patient with LDL 160.  Patient declined statin.  ADD: -Hold Adderall  History of breast cancer  Body mass index is 23.59 kg/m.   Family Communication/Anticipated D/C date and plan/Code Status   DVT prophylaxis: Eliquis Code Status: Full Code.  Family Communication: No family present at bedside Disposition Plan: Possible discharge home tomorrow   Medical Consultants:    Cardiology- Dr. Domenic Polite   Anti-Infectives:    None  Subjective:   Patient denies having any chest pain or discomfort.  She has had a intermittent cough.    Objective:   Telemetry shows occasional PACs, and brief episode of SVT versus atrial fibrillation personally reviewed.  Vitals:   06/29/18 0500 06/29/18 0600 06/29/18 0700 06/29/18 0753  BP:    105/70  Pulse: 72 70 87 77  Resp: 16 15 (!) 21 18  Temp:      TempSrc:      SpO2: 97% 96% 98% 97%  Weight:      Height:        Intake/Output Summary (Last 24 hours) at 06/29/2018 1204 Last data filed at 06/28/2018 2100 Gross per 24 hour  Intake 240 ml  Output -  Net 240 ml   Filed Weights   06/28/18 2100  Weight: 58.5 kg    Exam: Constitutional: NAD, calm, comfortable Eyes: PERRL, lids and conjunctivae normal ENMT: Mucous membranes are moist. Posterior pharynx clear of any exudate or lesions.   Neck: normal, supple, no masses, no thyromegaly Respiratory: clear to auscultation bilaterally, no wheezing, no crackles. Normal respiratory effort. No accessory muscle use.  Cardiovascular: Regular rate and rhythm, no murmurs / rubs / gallops. No extremity edema. 2+ pedal  pulses. No carotid bruits.  Abdomen: no tenderness, no masses palpated. No hepatosplenomegaly. Bowel sounds positive.  Musculoskeletal: no clubbing / cyanosis. No joint deformity upper and lower extremities. Good ROM, no contractures. Normal muscle tone.  Skin: no rashes, lesions, ulcers. No  induration Neurologic: CN 2-12 grossly intact. Sensation intact, DTR normal. Strength 5/5 in all 4.  Psychiatric: Normal judgment and insight. Alert and oriented x 3. Normal mood.    Data Reviewed:   I have personally reviewed following labs and imaging studies:  Labs: Labs show the following:   Basic Metabolic Panel: Recent Labs  Lab 06/29/18 0827  NA 136  K 3.2*  CL 103  CO2 23  GLUCOSE 149*  BUN 22  CREATININE 0.68  CALCIUM 8.2*   GFR Estimated Creatinine Clearance: 56.2 mL/min (by C-G formula based on SCr of 0.68 mg/dL). Liver Function Tests: Recent Labs  Lab 06/29/18 0827  AST 13*  ALT 14  ALKPHOS 83  BILITOT 0.5  PROT 6.5  ALBUMIN 3.3*   No results for input(s): LIPASE, AMYLASE in the last 168 hours. No results for input(s): AMMONIA in the last 168 hours. Coagulation profile No results for input(s): INR, PROTIME in the last 168 hours.  CBC: Recent Labs  Lab 06/29/18 0827  WBC 7.9  HGB 12.7  HCT 39.8  MCV 84.7  PLT 374   Cardiac Enzymes: No results for input(s): CKTOTAL, CKMB, CKMBINDEX, TROPONINI in the last 168 hours. BNP (last 3 results) No results for input(s): PROBNP in the last 8760 hours. CBG: No results for input(s): GLUCAP in the last 168 hours. D-Dimer: No results for input(s): DDIMER in the last 72 hours. Hgb A1c: No results for input(s): HGBA1C in the last 72 hours. Lipid Profile: No results for input(s): CHOL, HDL, LDLCALC, TRIG, CHOLHDL, LDLDIRECT in the last 72 hours. Thyroid function studies: Recent Labs    06/29/18 0828  TSH 1.571   Anemia work up: No results for input(s): VITAMINB12, FOLATE, FERRITIN, TIBC, IRON, RETICCTPCT in the last 72 hours. Sepsis Labs: Recent Labs  Lab 06/29/18 0827  WBC 7.9    Microbiology Recent Results (from the past 240 hour(s))  SARS Coronavirus 2 [facility] order, Performed in [facility] hospital lab)     Status: None   Collection Time: 06/29/18  1:03 AM  Result Value Ref Range  Status   SARS Coronavirus 2 NEGATIVE NEGATIVE Final    Comment: (NOTE) If result is NEGATIVE SARS-CoV-2 target nucleic acids are NOT DETECTED. The SARS-CoV-2 RNA is generally detectable in upper and lower  respiratory specimens during the acute phase of infection. The lowest  concentration of SARS-CoV-2 viral copies this assay can detect is 250  copies / mL. A negative result does not preclude SARS-CoV-2 infection  and should not be used as the sole basis for treatment or other  patient management decisions.  A negative result may occur with  improper specimen collection / handling, submission of specimen other  than nasopharyngeal swab, presence of viral mutation(s) within the  areas targeted by this assay, and inadequate number of viral copies  (<250 copies / mL). A negative result must be combined with clinical  observations, patient history, and epidemiological information. If result is POSITIVE SARS-CoV-2 target nucleic acids are DETECTED. The SARS-CoV-2 RNA is generally detectable in upper and lower  respiratory specimens dur ing the acute phase of infection.  Positive  results are indicative of active infection with SARS-CoV-2.  Clinical  correlation with patient history and other diagnostic information  is  necessary to determine patient infection status.  Positive results do  not rule out bacterial infection or co-infection with other viruses. If result is PRESUMPTIVE POSTIVE SARS-CoV-2 nucleic acids MAY BE PRESENT.   A presumptive positive result was obtained on the submitted specimen  and confirmed on repeat testing.  While 2019 novel coronavirus  (SARS-CoV-2) nucleic acids may be present in the submitted sample  additional confirmatory testing may be necessary for epidemiological  and / or clinical management purposes  to differentiate between  SARS-CoV-2 and other Sarbecovirus currently known to infect humans.  If clinically indicated additional testing with an alternate  test  methodology [id number]) is advised. The SARS-CoV-2 RNA is generally  detectable in upper and lower respiratory sp ecimens during the acute  phase of infection. The expected result is Negative. Fact Sheet for Patients:  StrictlyIdeas.no Fact Sheet for Healthcare Providers: BankingDealers.co.za This test is not yet approved or cleared by the [place] FDA and has been authorized for detection and/or diagnosis of SARS-CoV-2 by FDA under an Emergency Use Authorization (EUA).  This EUA will remain in effect (meaning this test can be used) for the duration of the COVID-19 declaration under Section 564(b)(1) of the Act, 21 U.S.C. section 360bbb-3(b)(1), unless the authorization is terminated or revoked sooner. Performed at [facility], [place] [address] [place], [place] [address]     Procedures and diagnostic studies:  No results found.  Medications:   . apixaban  5 mg Oral BID  . carvedilol  25 mg Oral BID WC  . [START ON 06/30/2018] insulin aspart  0-9 Units Subcutaneous TID WC  . potassium chloride  40 mEq Oral STAT  . sodium chloride flush  3 mL Intravenous Q12H   Continuous Infusions:   LOS: 1 day   Kevona Lupinacci A Leita Lindbloom  [facility]   *Please refer to [email or web address], password [email or web address] to get updated schedule on who will round on this patient, as hospitalists switch teams weekly. If 7PM-7AM, please contact night-coverage at [email or web address], password [email or web address] for any overnight needs.

## 2018-06-30 DIAGNOSIS — Z853 Personal history of malignant neoplasm of breast: Secondary | ICD-10-CM

## 2018-06-30 DIAGNOSIS — E785 Hyperlipidemia, unspecified: Secondary | ICD-10-CM

## 2018-06-30 DIAGNOSIS — E876 Hypokalemia: Secondary | ICD-10-CM

## 2018-06-30 DIAGNOSIS — I350 Nonrheumatic aortic (valve) stenosis: Secondary | ICD-10-CM

## 2018-06-30 DIAGNOSIS — I10 Essential (primary) hypertension: Secondary | ICD-10-CM

## 2018-06-30 DIAGNOSIS — I480 Paroxysmal atrial fibrillation: Secondary | ICD-10-CM | POA: Diagnosis not present

## 2018-06-30 HISTORY — DX: Personal history of malignant neoplasm of breast: Z85.3

## 2018-06-30 HISTORY — DX: Hyperlipidemia, unspecified: E78.5

## 2018-06-30 HISTORY — DX: Essential (primary) hypertension: I10

## 2018-06-30 HISTORY — DX: Hypokalemia: E87.6

## 2018-06-30 LAB — BASIC METABOLIC PANEL
Anion gap: 10 (ref 5–15)
BUN: 16 mg/dL (ref 8–23)
CO2: 28 mmol/L (ref 22–32)
Calcium: 8.7 mg/dL — ABNORMAL LOW (ref 8.9–10.3)
Chloride: 100 mmol/L (ref 98–111)
Creatinine, Ser: 0.75 mg/dL (ref 0.44–1.00)
GFR calc Af Amer: >60 mL/min (ref >60)
GFR calc non Af Amer: >60 mL/min (ref >60)
Glucose, Bld: 127 mg/dL — ABNORMAL HIGH (ref 70–99)
Potassium: 4.4 mmol/L (ref 3.5–5.1)
Sodium: 138 mmol/L (ref 135–145)

## 2018-06-30 LAB — GLUCOSE, CAPILLARY
Glucose-Capillary: 130 mg/dL — ABNORMAL HIGH (ref 70–99)
Glucose-Capillary: 121 mg/dL — ABNORMAL HIGH (ref 70–99)

## 2018-06-30 LAB — T3, FREE: T3, Free: 2.6 pg/mL (ref 2.0–4.4)

## 2018-06-30 LAB — MAGNESIUM: Magnesium: 2.4 mg/dL (ref 1.7–2.4)

## 2018-06-30 MED ORDER — FUROSEMIDE 20 MG PO TABS: 20.0000 mg | ORAL_TABLET | Freq: Every day | ORAL | Status: DC | PRN

## 2018-06-30 MED ORDER — ELIQUIS 5 MG VTE STARTER PACK: ORAL_TABLET | ORAL | 0 refills | Status: DC

## 2018-06-30 MED ORDER — CARVEDILOL 25 MG PO TABS: 25.0000 mg | ORAL_TABLET | Freq: Two times a day (BID) | ORAL | 0 refills | Status: DC

## 2018-06-30 MED FILL — ELIQUIS STARTER PACK 5 MG T: 5 | 30 days supply | Qty: 74 | Fill #0

## 2018-06-30 MED FILL — CARVEDILOL 25 MG TABLET: 25 | 30 days supply | Qty: 60 | Fill #0

## 2018-06-30 NOTE — TOC Benefit Eligibility Note (Signed)
Transition of Care [facility]) Benefit Eligibility Note    Patient Details  Name: Lindsay Guerrero MRN: [id number] Date of Birth: 1955-05-01   Medication/Dose: Arne Cleveland  5 MG BID(APIXABAN : NON-FORMULARY)  Covered?: Yes     Prescription Coverage Preferred Pharmacy: YES([facility]  / 90 DAY SUPPLY FOR M/O $ 70.00)  Spoke with Person/Company/Phone Number:: JOSH([facility] # [id number])  Co-Pay: $ 35.00  Prior Approval: No          Memory [employer] Phone Number: 06/30/2018, 10:54 AM

## 2018-06-30 NOTE — Discharge Instructions (Signed)
Heart Failure Education: 1. Weigh yourself EVERY morning after you go to the bathroom but before you eat or drink anything. Write this number down in a weight log/diary. If you gain 3 pounds overnight or 5 pounds in a week, you should take your as needed lasix 20mg  x1 dose and your potassium 20 mEq supplement x1 dose.  2. Take your medicines as prescribed. If you have concerns about your medications, please call us before you stop taking them.  3. Eat low salt foods--Limit salt (sodium) to 2000 mg per day. This will help prevent your body from holding onto fluid. Read food labels as many processed foods have a lot of sodium, especially canned goods and prepackaged meats. If you would like some assistance choosing low sodium foods, we would be happy to set you up with a nutritionist. 4. Stay as active as you can everyday. Staying active will give you more energy and make your muscles stronger. Start with 5 minutes at a time and work your way up to 30 minutes a day. Break up your activities--do some in the morning and some in the afternoon. Start with 3 days per week and work your way up to 5 days as you can.  If you have chest pain, feel short of breath, dizzy, or lightheaded, STOP. If you don't feel better after a short rest, call 911. If you do feel better, call the office to let us know you have symptoms with exercise. 5. Limit all fluids for the day to less than 2 liters. Fluid includes all drinks, coffee, juice, ice chips, soup, jello, and all other liquids.  __________________________________________________________________________________  YOUR CARDIOLOGY TEAM HAS ARRANGED FOR AN E-VISIT FOR YOUR APPOINTMENT - PLEASE REVIEW IMPORTANT INFORMATION BELOW SEVERAL DAYS PRIOR TO YOUR APPOINTMENT  Due to the recent COVID-19 pandemic, we are transitioning in-person office visits to tele-medicine visits in an effort to decrease unnecessary exposure to our patients, their families, and staff. These visits are  billed to your insurance just like a normal visit is. We also encourage you to sign up for MyChart if you have not already done so. You will need a smartphone if possible. For patients that do not have this, we can still complete the visit using a regular telephone but do prefer a smartphone to enable video when possible. You may have a family member that lives with you that can help. If possible, we also ask that you have a blood pressure cuff and scale at home to measure your blood pressure, heart rate and weight prior to your scheduled appointment. Patients with clinical needs that need an in-person evaluation and testing will still be able to come to the office if absolutely necessary. If you have any questions, feel free to call our office.     YOUR PROVIDER WILL BE USING ONE OF THE FOLLOWING PLATFORMS TO COMPLETE YOUR VISIT:    IF USING WEBEX - How to Download the WebEx App to Your SmartPhone  - If Apple device, go to [employer] and type in WebEx in the search bar. [place] [employer], the blue/green circle. If Android, go to [employer] and type in [employer] in the search bar. The app is free but as with any other app download, your phone may require you to verify saved payment information or Apple/Android password.  - You do NOT have to create an account. - On the day of the visit, our staff will walk you through joining the meeting with the  meeting number/password.   IF USING MYCHART - How to Download the MyChart App to Your SmartPhone   - If Apple, go to [employer] and type in MyChart in the search bar and download the app. If Android, ask patient to go to [employer] and type in [place] in the search bar and download the app. The app is free but as with any other app downloads, your phone may require you to verify saved payment information or Apple/Android password.  - You will need to then log into the app with your MyChart username and password, and select [place]  as your healthcare provider to link the account. When it is time for your visit, go to the MyChart app, find appointments, and click Begin Video Visit. Be sure to Select Allow for your device to access the Microphone and Camera for your visit. You will then be connected, and your provider will be with you shortly.  **If you have any issues connecting or need assistance, please contact MyChart service desk [phone])**  **If using a computer, in order to ensure the best quality for your visit, you will need to use either of the following Internet Browsers: [employer], or Google Chrome**   IF USING DOXIMITY or [email or web address].ME - The staff will give you instructions on receiving your link to join the meeting the day of your visit.      2-3 DAYS BEFORE YOUR APPOINTMENT  You will receive a telephone call from one of our [place] team members - your caller ID may say "Unknown caller." If this is a video visit, we will walk you through how to set up your device to be able to complete the visit. We will remind you check your blood pressure, heart rate and weight prior to your scheduled appointment. If you have an Apple Watch or Kardia, please upload any pertinent ECG strips the day before or morning of your appointment to [place]. Our staff will also make sure you have reviewed the consent and agree to move forward with your scheduled tele-health visit.     THE DAY OF YOUR APPOINTMENT  Approximately 15 minutes prior to your scheduled appointment, you will receive a telephone call from one of [place] team - your caller ID may say "Unknown caller."  Our staff will confirm medications, vital signs for the day and any symptoms you may be experiencing. Please have this information available prior to the time of visit start. It may also be helpful for you to have a pad of paper and pen handy for any instructions given during your visit. They will also walk you through joining the smartphone  meeting if this is a video visit.    CONSENT FOR TELE-HEALTH VISIT - PLEASE REVIEW  I hereby voluntarily request, consent and authorize [facility] and its employed or contracted physicians, physician assistants, nurse practitioners or other licensed health care professionals (the Practitioner), to provide me with telemedicine health care services (the Services") as deemed necessary by the treating Practitioner. I acknowledge and consent to receive the Services by the Practitioner via telemedicine. I understand that the telemedicine visit will involve communicating with the Practitioner through live audiovisual communication technology and the disclosure of certain medical information by electronic transmission. I acknowledge that I have been given the opportunity to request an in-person assessment or other available alternative prior to the telemedicine visit and am voluntarily participating in the telemedicine visit.  I understand that I have the right to withhold or  withdraw my consent to the use of telemedicine in the course of my care at any time, without affecting my right to future care or treatment, and that the Practitioner or I may terminate the telemedicine visit at any time. I understand that I have the right to inspect all information obtained and/or recorded in the course of the telemedicine visit and may receive copies of available information for a reasonable fee.  I understand that some of the potential risks of receiving the Services via telemedicine include:   Delay or interruption in medical evaluation due to technological equipment failure or disruption;  Information transmitted may not be sufficient (e.g. poor resolution of images) to allow for appropriate medical decision making by the Practitioner; and/or   In rare instances, security protocols could fail, causing a breach of personal health information.  Furthermore, I acknowledge that it is my responsibility to provide  information about my medical history, conditions and care that is complete and accurate to the best of my ability. I acknowledge that Practitioner's advice, recommendations, and/or decision may be based on factors not within their control, such as incomplete or inaccurate data provided by me or distortions of diagnostic images or specimens that may result from electronic transmissions. I understand that the practice of medicine is not an exact science and that Practitioner makes no warranties or guarantees regarding treatment outcomes. I acknowledge that I will receive a copy of this consent concurrently upon execution via email to the email address I last provided but may also request a printed copy by calling the office of [facility].    I understand that my insurance will be billed for this visit.   I have read or had this consent read to me.  I understand the contents of this consent, which adequately explains the benefits and risks of the Services being provided via telemedicine.   I have been provided ample opportunity to ask questions regarding this consent and the Services and have had my questions answered to my satisfaction.  I give my informed consent for the services to be provided through the use of telemedicine in my medical care  By participating in this telemedicine visit I agree to the above.  Information on my medicine - ELIQUIS (apixaban)  This medication education was reviewed with me or my healthcare representative as part of my discharge preparation.   Why was Eliquis prescribed for you? Eliquis was prescribed for you to reduce the risk of a blood clot forming that can cause a stroke if you have a medical condition called atrial fibrillation (a type of irregular heartbeat).  What do You need to know about Eliquis ? Take your Eliquis TWICE DAILY - one tablet in the morning and one tablet in the evening with or without food. If you have difficulty swallowing the  tablet whole please discuss with your pharmacist how to take the medication safely.  Take Eliquis exactly as prescribed by your doctor and DO NOT stop taking Eliquis without talking to the doctor who prescribed the medication.  Stopping may increase your risk of developing a stroke.  Refill your prescription before you run out.  After discharge, you should have regular check-up appointments with your healthcare provider that is prescribing your Eliquis.  In the future your dose may need to be changed if your kidney function or weight changes by a significant amount or as you get older.  What do you do if you miss a dose? If you miss a dose, take  it as soon as you remember on the same day and resume taking twice daily.  Do not take more than one dose of ELIQUIS at the same time to make up a missed dose.  Important Safety Information A possible side effect of Eliquis is bleeding. You should call your healthcare provider right away if you experience any of the following: ? Bleeding from an injury or your nose that does not stop. ? Unusual colored urine (red or dark brown) or unusual colored stools (red or black). ? Unusual bruising for unknown reasons. ? A serious fall or if you hit your head (even if there is no bleeding).  Some medicines may interact with Eliquis and might increase your risk of bleeding or clotting while on Eliquis. To help avoid this, consult your healthcare provider or pharmacist prior to using any new prescription or non-prescription medications, including herbals, vitamins, non-steroidal anti-inflammatory drugs (NSAIDs) and supplements.  This website has more information on Eliquis (apixaban): http://www.eliquis.com/eliquis/home

## 2018-06-30 NOTE — Progress Notes (Signed)
Progress Note  Patient Name: Lindsay Guerrero Date of Encounter: 06/30/2018  Primary Cardiologist: No primary care provider on file.   Subjective   Pt denies CP; mild dyspnea; cough  Inpatient Medications    Scheduled Meds: . apixaban  5 mg Oral BID  . carvedilol  25 mg Oral BID WC  . insulin aspart  0-9 Units Subcutaneous TID WC  . sodium chloride flush  3 mL Intravenous Q12H   Continuous Infusions:  PRN Meds: alum & mag hydroxide-simeth, benzonatate   Vital Signs    Vitals:   06/29/18 1920 06/30/18 0005 06/30/18 0433 06/30/18 0622  BP: 100/62 93/63 (!) 102/54   Pulse: 76 75 87   Resp: 20 20 (!) 24 19  Temp: 98.4 F (36.9 C) 97.8 F (36.6 C) 98 F (36.7 C)   TempSrc: Oral Oral Oral   SpO2: 98% 94% 96%   Weight:   59.7 kg   Height:        Intake/Output Summary (Last 24 hours) at 06/30/2018 0813 Last data filed at 06/30/2018 0534 Gross per 24 hour  Intake 600 ml  Output 300 ml  Net 300 ml   Filed Weights   06/28/18 2100 06/29/18 1542 06/30/18 0433  Weight: 58.5 kg 60 kg 59.7 kg    Telemetry    Sinus- Personally Reviewed  Physical Exam   GEN: No acute distress.   Neck: No JVD, no carotid bruits Cardiac: RRR, 2/6 systolic murmur  Respiratory: Clear to auscultation bilaterally, no wheezes/ rales/ rhonchi GI: NABS, Soft, nontender, non-distended  MS: No edema Neuro:  Nonfocal, moving all extremities spontaneously Psych: Normal affect   Labs    Chemistry Recent Labs  Lab 06/29/18 0827  NA 136  K 3.2*  CL 103  CO2 23  GLUCOSE 149*  BUN 22  CREATININE 0.68  CALCIUM 8.2*  PROT 6.5  ALBUMIN 3.3*  AST 13*  ALT 14  ALKPHOS 83  BILITOT 0.5  GFRNONAA >60  GFRAA >60  ANIONGAP 10     Hematology Recent Labs  Lab 06/29/18 0827  WBC 7.9  RBC 4.70  HGB 12.7  HCT 39.8  MCV 84.7  MCH 27.0  MCHC 31.9  RDW 15.3  PLT 374     Cardiac Studies   Echocardiogram 06/29/2018: IMPRESSIONS    1. The left ventricle has normal systolic  function with an ejection fraction of 60-65%. The cavity size was normal. Left ventricular diastolic parameters were normal.  2. The right ventricle has normal systolic function. The cavity was normal. There is no increase in right ventricular wall thickness.  3. The aortic valve is tricuspid. Moderate thickening of the aortic valve. Moderate calcification of the aortic valve. Aortic valve regurgitation is mild to moderate by color flow Doppler. Moderate stenosis of the aortic valve. AR PHT: 444 msec. AV Mean  Grad: 21.0 mmHg. AV Area (VTI): 0.96 cm. LVOT/AV VTI ratio: 0.28  4. Although the Mean AV gradient is in the mild range, the stroke volume index is < 35, AVA 0.96cm2 and the dimensionless index is low. Suspect that patient has significant low flow low gradient moderate to severe aortic stenosis.  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  She presents with recent URI symptoms with negative COVID-19 screen, found to be in newly documented rapid atrial fibrillation at [facility] and seen  in consultation by Dr. Bettina Gavia (note in hard chart).  She was treated with intravenous diltiazem and spontaneously converted to sinus rhythm  Transfer was arranged for further evaluation and repeat COVID-19 assessment  Assessment & Plan    1. New onset atrial fibrillation: Patient remains in sinus rhythm today.  Continue carvedilol for rate control of atrial fibrillation recurs.  Continue apixaban at present dose.  LV function normal.  TSH normal.    2. History of non-ischemic cardiomyopathy: history of EF 40% with normal coronaries on catheterization. Repeat echo in 2019 with recovered EF 55-60%. Echo this admission with EF 60-65%, normal LV diastolic function, and normal RV function.  - Continue carvedilol -ARB on hold as blood pressure is  borderline.  Can resume as an outpatient if blood pressure improves.  Will treat with Lasix as needed.  3. Aortic stenosis: Moderate to severe aortic stenosis.  No significant symptoms.  Will need follow-up in [place].  4. URI: patient presented with fever and cough. COVID-19 testing negative. RVP negative.  Management per primary care.  5. HLD: LDL 160 10/2017. Patient refusing statin - Continue to encourage heart healthy diet and exercise  [facility] will sign off.   Medication Recommendations:  Carvedilol 25mg  BID; eliquis 5mg  BID; prn lasix. Stop Aspirin to minimize bleeding risk Other recommendations (labs, testing, etc):  None Follow up as an outpatient:  Outpatient follow-up with Dr. Agustin Cree to be arranged. 2-4 weeks   For questions or updates, please contact [place] Please consult [email or web address] for contact info under Cardiology/STEMI.      Alex Gardener MD 06/30/2018, 8:13 AM   [phone]

## 2018-06-30 NOTE — Progress Notes (Signed)
Benefit check in progress for Eliquis; B Jordin Dambrosio RN,MHA,BSN [phone] 

## 2018-06-30 NOTE — Discharge Summary (Addendum)
Lindsay Guerrero, is a 63 y.o. female  DOB 23-Dec-1955  MRN [id number].  Admission date:  06/28/2018  Admitting Physician  Vilma Prader, MD  Discharge Date:  06/30/2018   Primary MD  Patient, No Pcp Per  Recommendations for primary care physician for things to follow:   Consider need of repeat chest imaging if cough persist    Discharge Diagnosis    Principal Problem:   Atrial fibrillation [facility]) Active Problems:   Aortic stenosis   History of breast cancer   Hypokalemia   Essential hypertension   Hyperlipidemia      Past Medical History:  Diagnosis Date   Anxiety state 11/19/2012   ICD10   Elective surgery for purposes other than treating health conditions 09/27/2010   ICD10   Inflamed seborrheic keratosis 08/08/2012   Malignant neoplasm of upper-inner quadrant of female breast ([place]) 11/01/2017   Primary  diagnosis   NICM (nonischemic cardiomyopathy) ([place]) 11/01/2015   Precordial pain 11/01/2015   Seborrheic keratoses 09/10/2016    Past Surgical History:  Procedure Laterality Date   BREAST LUMPECTOMY     CESAREAN SECTION     COSMETIC SURGERY     TONSILLECTOMY         HPI  from the history and physical done on the day of admission:    This is a 63 year old woman with medical problems including prior early stage left-sided breast cancer status post left anthracycline chemotherapy and radiation, non-insulin dependent DM, nonischemic cardiomyopathy with historically EF of 40% that has since recovered, moderate aortic stenosis, HTN, mild to moderate mitral regurgitation, and psychostimulant for ADD who presented to her local [facility]) with a complaint of palpitations and fast heart rate.  Roughly 6 days prior to admission she developed a cough as well as fever to 100.7 F.   She works as a Agricultural consultant, she was tested for COVID-19 was negative.  She was placed on corticosteroids as well as fluoroquinolone therapy.  She reports the evening prior to admission she developed palpitations and her heart rate monitoring device showed heart rate in the 160s.  Associated symptoms include right neck tightness, dyspnea.  She reports using Afrin nose spray constantly, no overt postnasal drip.  She reports nausea.  She denies overt chest pain, hematemesis, diarrhea, emesis, dysuria, syncope.  Echocardiogram and August 2018 remarkable for LVEF 55 to 27%, grade 1 diastolic dysfunction.  Moderate aortic stenosis.  Patient reports undergoing a left heart cath in 2017 which revealed normal coronaries, no significant coronary disease.  Course at outside medical center on 06/28/2018: Vital signs reviewed.  Remarkable for at 10:44 AM -heart rate 135, SBP 105, DBP 53.  Vitals at 1503-heart rate 86, BP 92/55. Lab work remarkable for CBC with platelet of 401,000, otherwise normal.  INR 1.  D-dimer within normal limits. NT-BNP 1550 (nl 0-900 pg/mL).  Hemoglobin A1c 6.9%.  Procalcitonin undetectable.  LDH within normal limits.  CRP within normal limits.  Urinalysis bland.  Troponin within normal limits.  Chest x-ray  did not reveal acute findings. Occasions given included: 10 mg IV diltiazem at 10:58 AM. 25 mg of IV diltiazem at 11 AM. -Started on a diltiazem drip. -Lorazepam 1 mg Patient was was evaluated by the medicine team and due to her prior respiratory symptoms deemed to be a person under investigation and warranted transfer to [facility] for further evaluation and management.   Hospital Course:  1.  Paroxysmal atrial fibrillation: Patient converted to sinus rhythm after being given intravenous Cardizem prior to transport. Currently in sinus rhythm.  Denies any complaints of chest pain.  Thyroid studies within normal limits.  Home Coreg dose was 12.5 mg twice daily. CHA2DS2-VASc score = at  least 3(DM, sex, HTN).  Patient's Coreg was increased 25 mg twice daily for better rate control.  Cardiology was consulted.  Patient had been on Lovenox shots, but was started on Eliquis.  Echocardiogram revealed EF of 6565% with moderate to severe aortic stenosis(impression below).  Cardiology agreed with anticoagulation to follow-up in outpatient with her cardiologist Dr. Agustin Cree.  Patient's medications for brought to bedside through [facility] care pharmacy.   2.  Hypokalemia: Replaced.  During hospitalization patient's potassium was noted to be as low as 3.2.  Given replacement and repeat potassium 4.4 prior to discharge.  3.  Essential hypertension: Blood pressures noted to be at the lower end of normal during her hospital stay ranging 93/63 -105/70.  Patient was continued on increased dose of Coreg.  Previous home medications of losartan were recommended to be held and furosemide was changed to as needed.  Also advised patient to follow-up with her cardiologist for which an appointment had already been set as seen below.  4.  History of nonischemic cardiomyopathy,  nonrheumatic aortic stenosis: Patient denies any symptoms of lightheadedness, chest pain, or dyspnea on exertion.  Echocardiogram notes EF to be 60 to 65% with moderate to severe aortic stenosis.  Currently followed Dr. Agustin Cree in the outpatient setting. Furosemide changed to as needed recommending regular monitoring weight.   5.  Fever and cough: Patient had been noted to have low-grade fever up to 100.7 F with cough.  COVID-19 previously reported to be negative on 4/13 was rechecked along with respiratory virus panel are both noted to be negative.  Patient reports already completing 5-day course of Levaquin which should be adequate as explained for treatment of pneumonia.  Recommend outpatient follow-up with primary to determine if any other further imaging studies need to be performed.  6.  Non-insulin-dependent diabetes  mellitus type 2: Last hemoglobin A1c 6.9.  Patient on SLGT of [place] that was not on medication list.  This was not continued during her hospital stay but she was advised to restart this at discharge.  7.  Hyperlipidemia: LDL elevated at 160.  Patient declined statin despite counseling.  8.  ADD: Adderall medication was held during hospitalization.   9.  History of breast cancer     Follow UP  Follow-up Information    Park Liter, MD Follow up on 07/08/2018.   Specialty:  Cardiology Why:  You have been scheduled for a virtual visit 07/08/2018 at 3:15pm. The office will contact you 2-3 days ahead of time to discuss details further. Additional information is listed in your discharge instructions for you to review.  Contact information: [address] [place] [address] [phone]            Consults obtained: Cardiology-Dr. Rozann Lesches and Dr. Kirk Ruths  Discharge Condition: Stable  Diet  and Activity recommendation: See Discharge Instructions below  Discharge Instructions    Discharge instructions   Complete by:  As directed    During your hospital stay you were evaluated by cardiology for new onset atrial fibrillation.  Carvedilol was increased to 25 mg twice daily for better control of heart rate.  Eliquis is a blood thinner that was started to decrease risks of clots blood.  Eliquis may need to be continued, but you will need to have talks with your cardiologist.  Blood pressures during your hospital stay have been soft.  Recommend holding losartan until able to follow-up with your cardiologist.  Also, furosemide was changed to as needed with you monitoring your weight daily.  Echocardiogram noted ejection fraction of 60 to 65% with moderate to severe aortic stenosis.  Please call and set up follow-up with Dr. Agustin Cree in 2 to 3 weeks regarding atrial fibrillation and aortic stenosis.  Lastly, okay to restart [place] although not listed on medication list.   Hemoglobin A1c obtained at [facility] prior to transfer was noted to be 6.9.  Get BMP and consider need of repeat chest x-ray medically warranted for persistence of cough-  checked  by Primary MD within 1-2 weeks ( we routinely change or add medications that can affect your baseline labs and fluid status, therefore we recommend that you get the mentioned basic workup next visit with your PCP, your PCP may decide not to get them or add new tests based on their clinical decision)  Activity: As tolerated  Disposition: Home  Diet: Heart Healthy/carbohydrate modified diet         For Heart failure patients - Check your Weight same time everyday, if you gain over 2 pounds, or you develop in leg swelling, experience more shortness of breath or chest pain, call your Primary doctor immediately. Follow Cardiac Low Salt Diet and 1.5 lit/day fluid restriction.  Special Instructions: If you have smoked or chewed Tobacco  in the last 2 yrs please stop smoking, stop any regular Alcohol  and or any Recreational drug use.  On your next visit with your primary care physician please Get Medicines reviewed and adjusted.  Please request your Patient, No Pcp Per to go over all Hospital Tests and Procedure/Radiological results at the follow up, please get all Hospital records sent to your Prim MD by signing hospital release before you go home.  If you experience worsening of your admission symptoms, develop shortness of breath, life threatening emergency, suicidal or homicidal thoughts you must seek medical attention immediately by calling 911 or calling your MD immediately  if symptoms less severe.  You Must read complete instructions/literature along with all the possible adverse reactions/side effects for all the Medicines you take and that have been prescribed to you. Take any new Medicines after you have completely understood and accpet all the possible adverse reactions/side effects.   Do not drive, operate  heavy machinery, perform activities at heights, swimming or participation in water activities or provide baby sitting services if your were admitted for syncope or siezures until you have seen by Primary MD or a Neurologist and advised to do so again.  Do not drive when taking Pain medications.  Do not take more than prescribed Pain, Sleep and Anxiety Medications  Wear Seat belts while driving.   Please note  You were cared for by a hospitalist during your hospital stay. If you have any questions about your discharge medications or the care you received while you were in  the hospital after you are discharged, you can call the unit and asked to speak with the hospitalist on call if the hospitalist that took care of you is not available. Once you are discharged, your primary care physician will handle any further medical issues. Please note that NO REFILLS for any discharge medications will be authorized once you are discharged, as it is imperative that you return to your primary care physician (or establish a relationship with a primary care physician if you do not have one) for your aftercare needs so that they can reassess your need for medications and monitor your lab values.   Increase activity slowly   Complete by:  As directed         Discharge Medications     Allergies as of 06/30/2018      Reactions   Lisinopril Cough   Iodine Rash   Metoprolol Rash      Medication List    STOP taking these medications   aspirin EC 81 MG tablet     TAKE these medications   acetaminophen 500 MG tablet Commonly known as:  TYLENOL Take 500-1,000 mg by mouth every 6 (six) hours as needed for headache (pain).   albuterol 108 (90 Base) MCG/ACT inhaler Commonly known as:  VENTOLIN HFA Inhale 2 puffs into the lungs every 6 (six) hours as needed for wheezing or shortness of breath.   amphetamine-dextroamphetamine 20 MG tablet Commonly known as:  ADDERALL Take 20 mg by mouth daily as needed  (ADD and/or hypersomnolence).   BLINK TEARS OP Place 1 drop into both eyes daily as needed (dry eyes).   buPROPion 300 MG 24 hr tablet Commonly known as:  WELLBUTRIN XL Take 300 mg by mouth daily as needed (anxiety/depression).   carvedilol 25 MG tablet Commonly known as:  COREG Take 1 tablet (25 mg total) by mouth 2 (two) times daily with a meal. What changed:    medication strength  how much to take  when to take this   Eliquis DVT/PE Starter Pack 5 MG Tabs Take as directed on package: start with two-5mg  tablets twice daily for 7 days. On day 8, switch to one-5mg  tablet twice daily.   furosemide 20 MG tablet Commonly known as:  LASIX Take 1 tablet (20 mg total) by mouth daily as needed for fluid. What changed:    when to take this  reasons to take this   NAC PO Take 2-3 tablets by mouth 2 (two) times a day.   ondansetron 8 MG tablet Commonly known as:  ZOFRAN Take 4-8 mg by mouth every 8 (eight) hours as needed (nausea while traveling).   oxymetazoline 0.05 % nasal spray Commonly known as:  AFRIN Place 1 spray into both nostrils daily.   potassium chloride SA 20 MEQ tablet Commonly known as:  K-DUR Take 20 mEq by mouth at bedtime.   pramipexole 1.5 MG tablet Commonly known as:  MIRAPEX Take 0.375-1.5 tablets by mouth at bedtime as needed (restless legs). 1/4-1 tablet   VITAMIN A PO Take 1 capsule by mouth 2 (two) times daily at 8 am and 10 pm.   VITAMIN C PO Take 1 tablet by mouth 3 (three) times daily.   Zicam Cold Remedy Tbdp Take 1 tablet by mouth 3 (three) times daily.       Major procedures and Radiology Reports - PLEASE review detailed and final reports for all details, in brief -   Echocardiogram 06/29/2018: Impression  1. The left ventricle has  normal systolic function with an ejection fraction of 60-65%. The cavity size was normal. Left ventricular diastolic parameters were normal.  2. The right ventricle has normal systolic function. The  cavity was normal. There is no increase in right ventricular wall thickness.  3. The aortic valve is tricuspid. Moderate thickening of the aortic valve. Moderate calcification of the aortic valve. Aortic valve regurgitation is mild to moderate by color flow Doppler. Moderate stenosis of the aortic valve. AR PHT: 444 msec. AV Mean  Grad: 21.0 mmHg. AV Area (VTI): 0.96 cm. LVOT/AV VTI ratio: 0.28  4. Although the Mean AV gradient is in the mild range, the stroke volume index is < 35, AVA 0.96cm2 and the dimensionless index is low. Suspect that patient has significant low flow low gradient moderate to severe aortic stenosis.   Micro Results     Recent Results (from the past 240 hour(s))  SARS Coronavirus 2 [facility] order, Performed in [place] hospital lab)     Status: None   Collection Time: 06/29/18  1:03 AM  Result Value Ref Range Status   SARS Coronavirus 2 NEGATIVE NEGATIVE Final    Comment: (NOTE) If result is NEGATIVE SARS-CoV-2 target nucleic acids are NOT DETECTED. The SARS-CoV-2 RNA is generally detectable in upper and lower  respiratory specimens during the acute phase of infection. The lowest  concentration of SARS-CoV-2 viral copies this assay can detect is 250  copies / mL. A negative result does not preclude SARS-CoV-2 infection  and should not be used as the sole basis for treatment or other  patient management decisions.  A negative result may occur with  improper specimen collection / handling, submission of specimen other  than nasopharyngeal swab, presence of viral mutation(s) within the  areas targeted by this assay, and inadequate number of viral copies  (<250 copies / mL). A negative result must be combined with clinical  observations, patient history, and epidemiological information. If result is POSITIVE SARS-CoV-2 target nucleic acids are DETECTED. The SARS-CoV-2 RNA is generally detectable in upper and lower  respiratory specimens dur ing the acute phase  of infection.  Positive  results are indicative of active infection with SARS-CoV-2.  Clinical  correlation with patient history and other diagnostic information is  necessary to determine patient infection status.  Positive results do  not rule out bacterial infection or co-infection with other viruses. If result is PRESUMPTIVE POSTIVE SARS-CoV-2 nucleic acids MAY BE PRESENT.   A presumptive positive result was obtained on the submitted specimen  and confirmed on repeat testing.  While 2019 novel coronavirus  (SARS-CoV-2) nucleic acids may be present in the submitted sample  additional confirmatory testing may be necessary for epidemiological  and / or clinical management purposes  to differentiate between  SARS-CoV-2 and other Sarbecovirus currently known to infect humans.  If clinically indicated additional testing with an alternate test  methodology [phone]) is advised. The SARS-CoV-2 RNA is generally  detectable in upper and lower respiratory sp ecimens during the acute  phase of infection. The expected result is Negative. Fact Sheet for Patients:  StrictlyIdeas.no Fact Sheet for Healthcare Providers: BankingDealers.co.za This test is not yet approved or cleared by the [place] FDA and has been authorized for detection and/or diagnosis of SARS-CoV-2 by FDA under an Emergency Use Authorization (EUA).  This EUA will remain in effect (meaning this test can be used) for the duration of the COVID-19 declaration under Section 564(b)(1) of the Act, 21 U.S.C. section 360bbb-3(b)(1), unless the authorization  is terminated or revoked sooner. Performed at [facility], [place] [address], [place], [place] [address]   Respiratory Panel by PCR     Status: None   Collection Time: 06/29/18  1:03 AM  Result Value Ref Range Status   Adenovirus NOT DETECTED NOT DETECTED Final   Coronavirus 229E NOT DETECTED NOT DETECTED Final    Comment:  (NOTE) The Coronavirus on the Respiratory Panel, DOES NOT test for the novel  Coronavirus (2019 nCoV)    Coronavirus HKU1 NOT DETECTED NOT DETECTED Final   Coronavirus NL63 NOT DETECTED NOT DETECTED Final   Coronavirus OC43 NOT DETECTED NOT DETECTED Final   Metapneumovirus NOT DETECTED NOT DETECTED Final   Rhinovirus / Enterovirus NOT DETECTED NOT DETECTED Final   Influenza A NOT DETECTED NOT DETECTED Final   Influenza B NOT DETECTED NOT DETECTED Final   Parainfluenza Virus 1 NOT DETECTED NOT DETECTED Final   Parainfluenza Virus 2 NOT DETECTED NOT DETECTED Final   Parainfluenza Virus 3 NOT DETECTED NOT DETECTED Final   Parainfluenza Virus 4 NOT DETECTED NOT DETECTED Final   Respiratory Syncytial Virus NOT DETECTED NOT DETECTED Final   Bordetella pertussis NOT DETECTED NOT DETECTED Final   Chlamydophila pneumoniae NOT DETECTED NOT DETECTED Final   Mycoplasma pneumoniae NOT DETECTED NOT DETECTED Final    Comment: Performed at [facility], [place]. [address] [place], [place] [address]       Today   Subjective    Icis Budreau notes that she felt like everybody forgot about her fever and cough.  She wonders if she should still be taking Levaquin as it helped get rid of her fever symptoms.  Furthermore, patient notes that she had not been taking losartan regularly at home as her blood pressures had been okay.  She is ready to be discharged home and is okay with receiving her medications through transitions of care pharmacy. Objective   Telemetry: No significant arrhythmias overnight.  Blood pressure 104/83, pulse 85, temperature 98 F (36.7 C), temperature source Oral, resp. rate 19, height 4\' 4"  (1.321 m), weight 59.7 kg, SpO2 96 %.   Intake/Output Summary (Last 24 hours) at 06/30/2018 1057 Last data filed at 06/30/2018 0852 Gross per 24 hour  Intake 720 ml  Output 600 ml  Net 120 ml    Exam  Constitutional:  NAD, calm, comfortable Eyes: PERRL, lids and conjunctivae  normal ENMT: Mucous membranes are moist. Posterior pharynx clear of any exudate or lesions.Normal dentition.  Neck: normal, supple, no masses, no thyromegaly Respiratory: clear to auscultation bilaterally, no wheezing, no crackles. Normal respiratory effort. No accessory muscle use.  Cardiovascular: Regular rate and rhythm.  Crescendo decrescendo murmur 2-3/6. Abdomen: no tenderness, no masses palpated. No hepatosplenomegaly. Bowel sounds positive.  Musculoskeletal: no clubbing / cyanosis. No joint deformity upper and lower extremities. Good ROM, no contractures. Normal muscle tone.  Skin: no rashes, lesions, ulcers. No induration Neurologic: CN 2-12 grossly intact. Sensation intact, DTR normal. Strength 5/5 in all 4.  Psychiatric: Normal judgment and insight. Alert and oriented x 3. Normal mood.    Data Review   CBC w Diff:  Lab Results  Component Value Date   WBC 7.9 06/29/2018   HGB 12.7 06/29/2018   HGB 12.2 11/01/2017   HCT 39.8 06/29/2018   HCT 38.0 11/01/2017   PLT 374 06/29/2018   PLT 376 11/01/2017    CMP:  Lab Results  Component Value Date   NA 138 06/30/2018   NA 137  11/01/2017   K 4.4 06/30/2018   CL 100 06/30/2018   CO2 28 06/30/2018   BUN 16 06/30/2018   BUN 20 11/01/2017   CREATININE 0.75 06/30/2018   PROT 6.5 06/29/2018   PROT 7.8 11/01/2017   ALBUMIN 3.3 (L) 06/29/2018   ALBUMIN 4.6 11/01/2017   BILITOT 0.5 06/29/2018   BILITOT <0.2 11/01/2017   ALKPHOS 83 06/29/2018   AST 13 (L) 06/29/2018   ALT 14 06/29/2018  .   Total Time in preparing paper work, data evaluation and todays exam - 35 minutes  Norval Morton M.D on 06/30/2018 at 10:57 AM  [facility]   Office  [phone]

## 2018-07-01 ENCOUNTER — Telehealth: Payer: Self-pay | Admitting: Cardiology

## 2018-07-01 NOTE — Telephone Encounter (Signed)
Virtual Visit Pre-Appointment Phone Call  Steps For Call:  1. Confirm consent - "In the setting of the current Covid19 crisis, you are scheduled for a (phone or video) visit with your provider on (date) at (time).  Just as we do with many in-office visits, in order for you to participate in this visit, we must obtain consent.  If you'd like, I can send this to your mychart (if signed up) or email for you to review.  Otherwise, I can obtain your verbal consent now.  All virtual visits are billed to your insurance company just like a normal visit would be.  By agreeing to a virtual visit, we'd like you to understand that the technology does not allow for your provider to perform an examination, and thus may limit your provider's ability to fully assess your condition. If your provider identifies any concerns that need to be evaluated in person, we will make arrangements to do so.  Finally, though the technology is pretty good, we cannot assure that it will always work on either your or our end, and in the setting of a video visit, we may have to convert it to a phone-only visit.  In either situation, we cannot ensure that we have a secure connection.  Are you willing to proceed?" STAFF: Did the patient verbally acknowledge consent to telehealth visit? Document YES/NO here: yes  2. Confirm the BEST phone number to call the day of the visit by including in appointment notes  3. Give patient instructions for MyChart download to smartphone OR Doximity/Doxy.me as below if video visit (depending on what platform provider is using)  4. Confirm that appointment type is correct in Epic appointment notes (VIDEO vs PHONE)  5. Advise patient to be prepared with their blood pressure, heart rate, weight, any heart rhythm information, their current medicines, and a piece of paper and pen handy for any instructions they may receive the day of their visit  6. Inform patient they will receive a phone call 15 minutes  prior to their appointment time (may be from unknown caller ID) so they should be prepared to answer    TELEPHONE CALL NOTE  Jacqulyn Barresi has been deemed a candidate for a follow-up tele-health visit to limit community exposure during the Covid-19 pandemic. I spoke with the patient via phone to ensure availability of phone/video source, confirm preferred email & phone number, and discuss instructions and expectations.  I reminded Lindsay Guerrero to be prepared with any vital sign and/or heart rhythm information that could potentially be obtained via home monitoring, at the time of her visit. I reminded Halina Asano to expect a phone call prior to her visit.  Calla Kicks 07/01/2018 10:45 AM   INSTRUCTIONS FOR DOWNLOADING THE MYCHART APP TO SMARTPHONE  - The patient must first make sure to have activated MyChart and know their login information - If Apple, go to [employer] and type in MyChart in the search bar and download the app. If Android, ask patient to go to [employer] and type in [place] in the search bar and download the app. The app is free but as with any other app downloads, their phone may require them to verify saved payment information or Apple/Android password.  - The patient will need to then log into the app with their MyChart username and password, and select [facility] as their healthcare provider to link the account. When it is time for your visit, go to the  MyChart app, find appointments, and click Begin Video Visit. Be sure to Select Allow for your device to access the Microphone and Camera for your visit. You will then be connected, and your provider will be with you shortly.  **If they have any issues connecting, or need assistance please contact MyChart service desk [phone])**  **If using a computer, in order to ensure the best quality for their visit they will need to use either of the following Internet Browsers: [employer], or Google  Chrome**  IF USING DOXIMITY or DOXY.ME - The patient will receive a link just prior to their visit by text.     FULL LENGTH CONSENT FOR TELE-HEALTH VISIT   I hereby voluntarily request, consent and authorize [facility] and its employed or contracted physicians, physician assistants, nurse practitioners or other licensed health care professionals (the Practitioner), to provide me with telemedicine health care services (the Services") as deemed necessary by the treating Practitioner. I acknowledge and consent to receive the Services by the Practitioner via telemedicine. I understand that the telemedicine visit will involve communicating with the Practitioner through live audiovisual communication technology and the disclosure of certain medical information by electronic transmission. I acknowledge that I have been given the opportunity to request an in-person assessment or other available alternative prior to the telemedicine visit and am voluntarily participating in the telemedicine visit.  I understand that I have the right to withhold or withdraw my consent to the use of telemedicine in the course of my care at any time, without affecting my right to future care or treatment, and that the Practitioner or I may terminate the telemedicine visit at any time. I understand that I have the right to inspect all information obtained and/or recorded in the course of the telemedicine visit and may receive copies of available information for a reasonable fee.  I understand that some of the potential risks of receiving the Services via telemedicine include:   Delay or interruption in medical evaluation due to technological equipment failure or disruption;  Information transmitted may not be sufficient (e.g. poor resolution of images) to allow for appropriate medical decision making by the Practitioner; and/or   In rare instances, security protocols could fail, causing a breach of personal health  information.  Furthermore, I acknowledge that it is my responsibility to provide information about my medical history, conditions and care that is complete and accurate to the best of my ability. I acknowledge that Practitioner's advice, recommendations, and/or decision may be based on factors not within their control, such as incomplete or inaccurate data provided by me or distortions of diagnostic images or specimens that may result from electronic transmissions. I understand that the practice of medicine is not an exact science and that Practitioner makes no warranties or guarantees regarding treatment outcomes. I acknowledge that I will receive a copy of this consent concurrently upon execution via email to the email address I last provided but may also request a printed copy by calling the office of [facility].    I understand that my insurance will be billed for this visit.   I have read or had this consent read to me.  I understand the contents of this consent, which adequately explains the benefits and risks of the Services being provided via telemedicine.   I have been provided ample opportunity to ask questions regarding this consent and the Services and have had my questions answered to my satisfaction.  I give my informed consent for the  services to be provided through the use of telemedicine in my medical care  By participating in this telemedicine visit I agree to the above.  Patient gives verbal consent for televisit/pp

## 2018-07-04 ENCOUNTER — Telehealth: Payer: Self-pay | Admitting: *Deleted

## 2018-07-04 NOTE — Telephone Encounter (Signed)
Work wants release from Dr. Agustin Cree for pt to return to work. Pt is due to return on Monday, 4/27. Was at [facility] and went into A-fib while in hospital.  Contact is Butch Penny in [place] at [facility]: fax [phone] and phone # [phone]

## 2018-07-07 NOTE — Telephone Encounter (Signed)
Patient informed that we will complete video visit tomorrow first and then determine about returning to work. Patient verbally understands.

## 2018-07-07 NOTE — Telephone Encounter (Signed)
Needs to do video visit with her before,

## 2018-07-08 ENCOUNTER — Telehealth (INDEPENDENT_AMBULATORY_CARE_PROVIDER_SITE_OTHER): Payer: Commercial Managed Care - PPO | Admitting: Cardiology

## 2018-07-08 ENCOUNTER — Encounter: Payer: Self-pay | Admitting: Cardiology

## 2018-07-08 ENCOUNTER — Other Ambulatory Visit: Payer: Self-pay

## 2018-07-08 ENCOUNTER — Encounter: Payer: Self-pay | Admitting: *Deleted

## 2018-07-08 VITALS — BP 111/56 | HR 69 | Wt 132.0 lb

## 2018-07-08 DIAGNOSIS — I10 Essential (primary) hypertension: Secondary | ICD-10-CM

## 2018-07-08 DIAGNOSIS — I350 Nonrheumatic aortic (valve) stenosis: Secondary | ICD-10-CM

## 2018-07-08 DIAGNOSIS — I340 Nonrheumatic mitral (valve) insufficiency: Secondary | ICD-10-CM

## 2018-07-08 DIAGNOSIS — I428 Other cardiomyopathies: Secondary | ICD-10-CM

## 2018-07-08 DIAGNOSIS — I480 Paroxysmal atrial fibrillation: Secondary | ICD-10-CM

## 2018-07-08 NOTE — Progress Notes (Signed)
Virtual Visit via Video Note   This visit type was conducted due to national recommendations for restrictions regarding the COVID-19 Pandemic (e.g. social distancing) in an effort to limit this patient's exposure and mitigate transmission in our community.  Due to her co-morbid illnesses, this patient is at least at moderate risk for complications without adequate follow up.  This format is felt to be most appropriate for this patient at this time.  All issues noted in this document were discussed and addressed.  A limited physical exam was performed with this format.  Please refer to the patient's chart for her consent to telehealth for [facility].  Evaluation Performed:  Follow-up visit  This visit type was conducted due to national recommendations for restrictions regarding the COVID-19 Pandemic (e.g. social distancing).  This format is felt to be most appropriate for this patient at this time.  All issues noted in this document were discussed and addressed.  No physical exam was performed (except for noted visual exam findings with Video Visits).  Please refer to the patient's chart (MyChart message for video visits and phone note for telephone visits) for the patient's consent to telehealth for [facility].  Date:  07/08/2018  ID: Lindsay Guerrero, DOB [birth year], MRN [id number]   Patient Location: [place] [address]   Provider location:   [place] Office  PCP:  Patient, No Pcp Per  Cardiologist:  Jenne Campus, MD     Chief Complaint:  Doing well  History of Present Illness:    Lindsay Guerrero is a 63 y.o. female  who presents via audio/video conferencing for a telehealth visit today.  With aortic stenosis, history of cardiomyopathy however latest echocardiogram showed improvement and normalization.  Recently she ended going to hospital.  She was sick she got fever she got cough she also check her heart rate which noticed to be 160s she went to  hospital was found to be in atrial fibrillation.  Anticoagulation has been initiated only AV blocking agent was used and she converted to sinus rhythm also as a part of her evaluation with find out that she does have significant aortic stenosis probably best described as moderate to severe.  She is rehabbing from her sickness.  She still complain of having weakness fatigue and did not push herself heart intensive doing exercises.  We did discuss history of aortic stenosis look like she will require aortic valve replacement.  I do not think we at that point yet.  We probably getting close to it.  I will repeat her echocardiogram in about 3 to 4 months I warned her about signs and symptoms of of critical aortic stenosis which is unexplained shortness of breath, chest tightness, dizziness and passing out.  She will let me know if she experience any of this.   The patient does not have symptoms concerning for COVID-19 infection (fever, chills, cough, or new SHORTNESS OF BREATH).    Prior CV studies:   The following studies were reviewed today:  Echocardiogram which showed moderate to severe aortic stenosis.  Calculated aortic valve area was 0.96 cm, dimensionless index was 0.28.  Mean gradient was 21 mmHg peak 32.7 but that is the same time stroke-volume was low.     Past Medical History:  Diagnosis Date  . Anxiety state 11/19/2012   ICD10  . Elective surgery for purposes other than treating health conditions 09/27/2010   ICD10  . Inflamed seborrheic keratosis 08/08/2012  . Malignant neoplasm  of upper-inner quadrant of female breast ([place]) 11/01/2017   Primary  diagnosis  . NICM (nonischemic cardiomyopathy) ([place]) 11/01/2015  . Precordial pain 11/01/2015  . Seborrheic keratoses 09/10/2016    Past Surgical History:  Procedure Laterality Date  . BREAST LUMPECTOMY    . CESAREAN SECTION    . COSMETIC SURGERY    . TONSILLECTOMY       Current Meds  Medication Sig  . acetaminophen (TYLENOL) 500 MG  tablet Take 500-1,000 mg by mouth every 6 (six) hours as needed for headache (pain).  . Acetylcysteine (NAC PO) Take 2-3 tablets by mouth 2 (two) times a day.  . albuterol (VENTOLIN HFA) 108 (90 Base) MCG/ACT inhaler Inhale 2 puffs into the lungs every 6 (six) hours as needed for wheezing or shortness of breath.  . amphetamine-dextroamphetamine (ADDERALL) 20 MG tablet Take 20 mg by mouth daily as needed (ADD and/or hypersomnolence).   . Ascorbic Acid (VITAMIN C PO) Take 1 tablet by mouth 3 (three) times daily.  . carvedilol (COREG) 25 MG tablet Take 1 tablet (25 mg total) by mouth 2 (two) times daily with a meal.  . Eliquis DVT/PE Starter Pack (ELIQUIS STARTER PACK) 5 MG TABS Take as directed on package: start with two-5mg  tablets twice daily for 7 days. On day 8, switch to one-5mg  tablet twice daily.  . furosemide (LASIX) 20 MG tablet Take 1 tablet (20 mg total) by mouth daily as needed for fluid.  [name] ondansetron (ZOFRAN) 8 MG tablet Take 4-8 mg by mouth every 8 (eight) hours as needed (nausea while traveling).  [name] oxymetazoline (AFRIN) 0.05 % nasal spray Place 1 spray into both nostrils daily.  . Polyethylene Glycol 400 (BLINK TEARS OP) Place 1 drop into both eyes daily as needed (dry eyes).  . potassium chloride SA (K-DUR,KLOR-CON) 20 MEQ tablet Take 20 mEq by mouth at bedtime.   . pramipexole (MIRAPEX) 1.5 MG tablet Take 0.375-1.5 tablets by mouth at bedtime as needed (restless legs). 1/4-1 tablet  . VITAMIN A PO Take 1 capsule by mouth daily.       Family History: The patient's family history includes Heart Problems in her father and mother; Heart attack in her sister; Hypertension in her mother.   ROS:   Please see the history of present illness.     All other systems reviewed and are negative.   Labs/Other Tests and Data Reviewed:     Recent Labs: 06/29/2018: ALT 14; Hemoglobin 12.7; Platelets 374; TSH 1.571 06/30/2018: BUN 16; Creatinine, Ser 0.75; Magnesium 2.4; Potassium 4.4;  Sodium 138  Recent Lipid Panel    Component Value Date/Time   CHOL 242 (H) 11/01/2017 1030   TRIG 175 (H) 11/01/2017 1030   HDL 47 11/01/2017 1030   CHOLHDL 5.1 (H) 11/01/2017 1030   LDLCALC 160 (H) 11/01/2017 1030      Exam:    Vital Signs:  BP (!) 111/56   Pulse 69   Wt 132 lb (59.9 kg)   BMI 34.32 kg/m     Wt Readings from Last 3 Encounters:  07/08/18 132 lb (59.9 kg)  06/30/18 131 lb 9.6 oz (59.7 kg)  12/13/17 140 lb 12.8 oz (63.9 kg)     Well nourished, well developed in no acute distress. Alert awake and x3 talking to me being outside of her house.  Not in distress overall getting better.  Diagnosis for this visit:   1. NICM (nonischemic cardiomyopathy) ([place])   2. Nonrheumatic aortic valve stenosis   3. Nonrheumatic  mitral valve regurgitation   4. Paroxysmal atrial fibrillation (HCC)   5. Essential hypertension      ASSESSMENT & PLAN:    1.  Nonischemic cardiomyopathy now with normalization of her left ventricular ejection fraction.  On appropriate medication which I will continue. 2.  Nonrheumatic aortic valve stenosis appears to be moderate to severe.  We initiated conversation about potentially fixing the valve but I do not agree at the point yet.  We will carefully follow-up with another echocardiogram in about 3 to 4 months. 3.  Paroxysmal atrial fibrillation so far one documented episode anticoagulated which I will continue.  Her chads 2 Vascor equals 3.  I asked her to check her heart rate and rhythm on the regular basis and let us know what the rhythm is.  If she will have frequent recurrences of atrial fibrillation we will need to consider antiarrhythmic therapy.  COVID-19 Education: The signs and symptoms of COVID-19 were discussed with the patient and how to seek care for testing (follow up with PCP or arrange E-visit).  The importance of social distancing was discussed today.  Patient Risk:   After full review of this patients clinical status, I  feel that they are at least moderate risk at this time.  Time:   Today, I have spent 19 minutes with the patient with telehealth technology discussing pt health issues.  I spent 5 minutes reviewing her chart before the visit.  Visit was finished at 2:51 PM.    Medication Adjustments/Labs and Tests Ordered: Current medicines are reviewed at length with the patient today.  Concerns regarding medicines are outlined above.  No orders of the defined types were placed in this encounter.  Medication changes: No orders of the defined types were placed in this encounter.    Disposition: Follow-up in 1 month  Signed, Park Liter, MD, [facility] 07/08/2018 2:52 PM    [place]

## 2018-07-08 NOTE — Patient Instructions (Signed)
Medication Instructions:  Your physician recommends that you continue on your current medications as directed. Please refer to the Current Medication list given to you today.  If you need a refill on your cardiac medications before your next appointment, please call your pharmacy.   Lab work: None If you have labs (blood work) drawn today and your tests are completely normal, you will receive your results only by: . MyChart Message (if you have MyChart) OR . A paper copy in the mail If you have any lab test that is abnormal or we need to change your treatment, we will call you to review the results.  Testing/Procedures: None  Follow-Up: At [facility], you and your health needs are our priority.  As part of our continuing mission to provide you with exceptional heart care, we have created designated Provider Care Teams.  These Care Teams include your primary Cardiologist (physician) and Advanced Practice Providers (APPs -  Physician Assistants and Nurse Practitioners) who all work together to provide you with the care you need, when you need it. You will need a follow up appointment in 1 months Any Other Special Instructions Will Be Listed Below (If Applicable).    

## 2018-07-09 ENCOUNTER — Telehealth: Payer: Self-pay | Admitting: Emergency Medicine

## 2018-07-09 NOTE — Telephone Encounter (Signed)
Called patient regarding short term disability paperwork that came in today. She needs this filled out, I informed her of the protocol and fee for this. She will bring payment Friday.

## 2018-07-28 DIAGNOSIS — I480 Paroxysmal atrial fibrillation: Secondary | ICD-10-CM

## 2018-07-28 DIAGNOSIS — I10 Essential (primary) hypertension: Secondary | ICD-10-CM

## 2018-07-29 MED ORDER — ELIQUIS 5 MG VTE STARTER PACK: ORAL_TABLET | ORAL | 0 refills | Status: DC

## 2018-07-29 MED ORDER — CARVEDILOL 25 MG PO TABS: 25.0000 mg | ORAL_TABLET | Freq: Two times a day (BID) | ORAL | 1 refills | Status: DC

## 2018-07-29 MED ORDER — LOSARTAN POTASSIUM 25 MG PO TABS: 25.0000 mg | ORAL_TABLET | Freq: Two times a day (BID) | ORAL | 1 refills | Status: DC

## 2018-07-29 NOTE — Addendum Note (Signed)
Addended by: Polly Cobia A on: 07/29/2018 05:12 PM   Modules accepted: Orders

## 2018-07-30 MED ORDER — CARVEDILOL 25 MG PO TABS: 25.0000 mg | ORAL_TABLET | Freq: Two times a day (BID) | ORAL | 1 refills | Status: DC

## 2018-07-30 NOTE — Telephone Encounter (Addendum)
Left voice message requesting return call to [phone]  for additional heart rate information.

## 2018-07-30 NOTE — Addendum Note (Signed)
Addended by: Polly Cobia A on: 07/30/2018 10:53 AM   Modules accepted: Orders

## 2018-07-30 NOTE — Telephone Encounter (Addendum)
Phoned patient to follow-up on VS this morning. States BP 140/70 and HR 67. She states that HR is normally in the 60's-70's and she has not been bradycardic since starting increased carvedilol dose of 25mg  twice daily. Informed patient that potassium level drawn at Dr. Tenna Delaine office 06/25/18 is 4.6 and remainder of labs are being sent to [place] by Dr. Tenna Delaine office. Informed that Dr. Agustin Cree wants to hold off on starting Losartan at this time.     She states she takes lasix as needed for edema and takes potassium as needed too. If she takes potassium every day she gets leg cramps and she think her system of taking both meds as needed is working well. 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         pls advise if you would like additional labs drawn in 1 week. Labs from 06/25/18 being scanned into chart now.

## 2018-08-01 NOTE — Telephone Encounter (Signed)
Informed patient Dr. Agustin Cree would like her to have labs drawn next week, she'd like to have them drawn at [facility] next to [facility].  Chem 7 order entered, no other questions or concerns

## 2018-08-01 NOTE — Addendum Note (Signed)
Addended by: Polly Cobia A on: 08/01/2018 04:05 PM   Modules accepted: Orders

## 2018-08-07 LAB — BASIC METABOLIC PANEL WITH GFR
BUN/Creatinine Ratio: 16 (ref 12–28)
BUN: 13 mg/dL (ref 8–27)
CO2: 22 mmol/L (ref 20–29)
Calcium: 9.2 mg/dL (ref 8.7–10.3)
Chloride: 100 mmol/L (ref 96–106)
Creatinine, Ser: 0.81 mg/dL (ref 0.57–1.00)
GFR calc Af Amer: 89 mL/min/1.73
GFR calc non Af Amer: 77 mL/min/1.73
Glucose: 110 mg/dL — ABNORMAL HIGH (ref 65–99)
Potassium: 4.5 mmol/L (ref 3.5–5.2)
Sodium: 140 mmol/L (ref 134–144)

## 2018-08-08 ENCOUNTER — Telehealth: Payer: Commercial Managed Care - PPO | Admitting: Cardiology

## 2018-08-18 ENCOUNTER — Ambulatory Visit: Payer: BLUE CROSS/BLUE SHIELD | Admitting: Cardiology

## 2018-09-04 ENCOUNTER — Other Ambulatory Visit: Payer: Self-pay

## 2018-09-04 MED ORDER — APIXABAN 5 MG PO TABS: 5.0000 mg | ORAL_TABLET | Freq: Two times a day (BID) | ORAL | 1 refills | Status: DC

## 2018-09-04 MED ORDER — CARVEDILOL 25 MG PO TABS: 25.0000 mg | ORAL_TABLET | Freq: Two times a day (BID) | ORAL | 1 refills | Status: DC

## 2018-09-05 DIAGNOSIS — I428 Other cardiomyopathies: Secondary | ICD-10-CM

## 2018-09-05 DIAGNOSIS — I480 Paroxysmal atrial fibrillation: Secondary | ICD-10-CM

## 2018-09-24 DIAGNOSIS — G2581 Restless legs syndrome: Secondary | ICD-10-CM

## 2018-09-24 DIAGNOSIS — I119 Hypertensive heart disease without heart failure: Secondary | ICD-10-CM

## 2018-09-24 DIAGNOSIS — I5022 Chronic systolic (congestive) heart failure: Secondary | ICD-10-CM

## 2018-09-24 DIAGNOSIS — R002 Palpitations: Secondary | ICD-10-CM

## 2018-09-24 DIAGNOSIS — Z7901 Long term (current) use of anticoagulants: Secondary | ICD-10-CM | POA: Diagnosis not present

## 2018-09-24 DIAGNOSIS — I480 Paroxysmal atrial fibrillation: Secondary | ICD-10-CM | POA: Diagnosis not present

## 2018-09-24 DIAGNOSIS — I350 Nonrheumatic aortic (valve) stenosis: Secondary | ICD-10-CM

## 2018-09-24 DIAGNOSIS — E119 Type 2 diabetes mellitus without complications: Secondary | ICD-10-CM

## 2018-09-25 DIAGNOSIS — Z7901 Long term (current) use of anticoagulants: Secondary | ICD-10-CM | POA: Diagnosis not present

## 2018-09-25 DIAGNOSIS — I350 Nonrheumatic aortic (valve) stenosis: Secondary | ICD-10-CM | POA: Diagnosis not present

## 2018-09-25 DIAGNOSIS — R002 Palpitations: Secondary | ICD-10-CM | POA: Diagnosis not present

## 2018-09-25 DIAGNOSIS — I5022 Chronic systolic (congestive) heart failure: Secondary | ICD-10-CM | POA: Diagnosis not present

## 2018-09-25 DIAGNOSIS — I480 Paroxysmal atrial fibrillation: Secondary | ICD-10-CM | POA: Diagnosis not present

## 2018-09-25 DIAGNOSIS — I119 Hypertensive heart disease without heart failure: Secondary | ICD-10-CM | POA: Diagnosis not present

## 2018-10-20 ENCOUNTER — Other Ambulatory Visit: Payer: Commercial Managed Care - PPO

## 2018-10-20 ENCOUNTER — Telehealth: Payer: Self-pay | Admitting: Cardiology

## 2018-10-20 NOTE — Telephone Encounter (Signed)
Patient is not rescheduling any appts or testing. She is very upset Dr. Raliegh Ip has not responded to any of her mychart and no one has called her to check on her progress after procedure. I tried to reschedule her appt along with her echo same day but she refused. She would like a call.

## 2018-10-21 NOTE — Telephone Encounter (Signed)
Left message for patient to return call.

## 2018-10-27 ENCOUNTER — Ambulatory Visit: Payer: Commercial Managed Care - PPO | Admitting: Cardiology

## 2018-10-27 ENCOUNTER — Other Ambulatory Visit: Payer: Commercial Managed Care - PPO

## 2018-10-28 NOTE — Telephone Encounter (Signed)
Patient returned call. She informed me she recently had a echo at [facility]. I will try to get report tomorrow and have Dr. Agustin Cree review and then we will follow up with patient with his recommendations. She verbally understands. No further questions.

## 2018-10-28 NOTE — Telephone Encounter (Signed)
Left message for patient to return call also responded to her mychart message asking her to call.

## 2018-10-29 NOTE — Telephone Encounter (Signed)
Called patient and informed her that Dr. Agustin Cree reviewed the echo from [place] and has advised that she have a appointment with him. I scheduled her to see him next week. She verbally understands. No further questions.

## 2018-11-05 ENCOUNTER — Ambulatory Visit (INDEPENDENT_AMBULATORY_CARE_PROVIDER_SITE_OTHER): Payer: Commercial Managed Care - PPO | Admitting: Cardiology

## 2018-11-05 ENCOUNTER — Ambulatory Visit (HOSPITAL_BASED_OUTPATIENT_CLINIC_OR_DEPARTMENT_OTHER)
Admission: RE | Admit: 2018-11-05 | Discharge: 2018-11-05 | Disposition: A | Discharge status: Home / Self Care | Payer: Commercial Managed Care - PPO | Source: Ambulatory Visit | Attending: Cardiology | Admitting: Cardiology

## 2018-11-05 ENCOUNTER — Encounter: Payer: Self-pay | Admitting: Cardiology

## 2018-11-05 ENCOUNTER — Other Ambulatory Visit: Payer: Self-pay

## 2018-11-05 VITALS — BP 122/72 | HR 63 | Ht 62.0 in | Wt 138.9 lb

## 2018-11-05 DIAGNOSIS — I428 Other cardiomyopathies: Secondary | ICD-10-CM

## 2018-11-05 DIAGNOSIS — I350 Nonrheumatic aortic (valve) stenosis: Secondary | ICD-10-CM | POA: Diagnosis not present

## 2018-11-05 DIAGNOSIS — I480 Paroxysmal atrial fibrillation: Secondary | ICD-10-CM | POA: Diagnosis not present

## 2018-11-05 LAB — ECHOCARDIOGRAM COMPLETE
Height: 62 in
Weight: 2222.4 oz

## 2018-11-05 NOTE — Patient Instructions (Signed)
Medication Instructions:  Your physician recommends that you continue on your current medications as directed. Please refer to the Current Medication list given to you today.  If you need a refill on your cardiac medications before your next appointment, please call your pharmacy.   Lab work: None.  If you have labs (blood work) drawn today and your tests are completely normal, you will receive your results only by: . MyChart Message (if you have MyChart) OR . A paper copy in the mail If you have any lab test that is abnormal or we need to change your treatment, we will call you to review the results.  Testing/Procedures: Your physician has requested that you have an echocardiogram. Echocardiography is a painless test that uses sound waves to create images of your heart. It provides your doctor with information about the size and shape of your heart and how well your heart's chambers and valves are working. This procedure takes approximately one hour. There are no restrictions for this procedure.    Follow-Up: At [facility], you and your health needs are our priority.  As part of our continuing mission to provide you with exceptional heart care, we have created designated Provider Care Teams.  These Care Teams include your primary Cardiologist (physician) and Advanced Practice Providers (APPs -  Physician Assistants and Nurse Practitioners) who all work together to provide you with the care you need, when you need it. You will need a follow up appointment in 1 months.  Please call our office 2 months in advance to schedule this appointment.  You may see No primary care provider on file. or another member of our [facility] Provider Team in [place]: Brian Munley, MD . Rajan Revankar, MD  Any Other Special Instructions Will Be Listed Below (If Applicable).   Echocardiogram An echocardiogram is a procedure that uses painless sound waves (ultrasound) to produce an image of the  heart. Images from an echocardiogram can provide important information about:  Signs of coronary artery disease (CAD).  Aneurysm detection. An aneurysm is a weak or damaged part of an artery wall that bulges out from the normal force of blood pumping through the body.  Heart size and shape. Changes in the size or shape of the heart can be associated with certain conditions, including heart failure, aneurysm, and CAD.  Heart muscle function.  Heart valve function.  Signs of a past heart attack.  Fluid buildup around the heart.  Thickening of the heart muscle.  A tumor or infectious growth around the heart valves. Tell a health care provider about:  Any allergies you have.  All medicines you are taking, including vitamins, herbs, eye drops, creams, and over-the-counter medicines.  Any blood disorders you have.  Any surgeries you have had.  Any medical conditions you have.  Whether you are pregnant or may be pregnant. What are the risks? Generally, this is a safe procedure. However, problems may occur, including:  Allergic reaction to dye (contrast) that may be used during the procedure. What happens before the procedure? No specific preparation is needed. You may eat and drink normally. What happens during the procedure?   An IV tube may be inserted into one of your veins.  You may receive contrast through this tube. A contrast is an injection that improves the quality of the pictures from your heart.  A gel will be applied to your chest.  A wand-like tool (transducer) will be moved over your chest. The gel will help   to transmit the sound waves from the transducer.  The sound waves will harmlessly bounce off of your heart to allow the heart images to be captured in real-time motion. The images will be recorded on a computer. The procedure may vary among health care providers and hospitals. What happens after the procedure?  You may return to your normal, everyday  life, including diet, activities, and medicines, unless your health care provider tells you not to do that. Summary  An echocardiogram is a procedure that uses painless sound waves (ultrasound) to produce an image of the heart.  Images from an echocardiogram can provide important information about the size and shape of your heart, heart muscle function, heart valve function, and fluid buildup around your heart.  You do not need to do anything to prepare before this procedure. You may eat and drink normally.  After the echocardiogram is completed, you may return to your normal, everyday life, unless your health care provider tells you not to do that. This information is not intended to replace advice given to you by your health care provider. Make sure you discuss any questions you have with your health care provider. Document Released: 02/24/2000 Document Revised: 06/19/2018 Document Reviewed: 03/31/2016 Elsevier Patient Education  2020 Elsevier Inc.  '  

## 2018-11-05 NOTE — Progress Notes (Signed)
  Echocardiogram 2D Echocardiogram has been performed.  Lindsay Guerrero 11/05/2018, 10:11 AM

## 2018-11-05 NOTE — Progress Notes (Signed)
Cardiology Office Note:    Date:  11/05/2018   ID:  Lindsay Guerrero, DOB 1955/09/25, MRN [id number]  PCP:  Patient, No Pcp Per  Cardiologist:  Jenne Campus, MD    Referring MD: No ref. provider found   Chief Complaint  Patient presents with  . Discuss Echo  I was in the hospital because of atrial fibrillation  History of Present Illness:    Lindsay Guerrero is a 63 y.o. female with history of aortic stenosis last assessment done in April showing moderate aortic stenosis however there was some discrepancy numbers.  Recently she ended up coming to [facility] because of palpitations.  She was found to be in atrial fibrillation.  She was put on Cardizem and eventually she converted spontaneously she was given amiodarone as well as anticoagulation.  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  Overall she seems to be doing well however she described the fact that she is more tired than she used to be.  Denies having any chest pain tightness squeezing pressure burning chest just fatigue shortness of breath is there.  In the meantime she did have another echocardiogram done by her primary care physician which look worse in terms of aortic stenosis again some discrepancy in the number.  We had a long discussion about what to do with the situation today and I recommend to repeat another echocardiogram to look at the valve again.  Luckily she denies having any palpitations.  She takes anticoagulation.  She told me that she preferred to have her valve fixed in some academic center like [facility].  Past Medical History:  Diagnosis Date  . Anxiety state 11/19/2012   ICD10  . Elective surgery for purposes other than treating health conditions 09/27/2010   ICD10  . Inflamed seborrheic keratosis 08/08/2012  . Malignant neoplasm of upper-inner quadrant of female breast ([place]) 11/01/2017   Primary  diagnosis  . NICM (nonischemic cardiomyopathy) ([place])  11/01/2015  . Precordial pain 11/01/2015  . Seborrheic keratoses 09/10/2016    Past Surgical History:  Procedure Laterality Date  . BREAST LUMPECTOMY    . CESAREAN SECTION    . COSMETIC SURGERY    . TONSILLECTOMY      Current Medications: Current Meds  Medication Sig  . acetaminophen (TYLENOL) 500 MG tablet Take 500-1,000 mg by mouth every 6 (six) hours as needed for headache (pain).  . Acetylcysteine (NAC PO) Take 2-3 tablets by mouth 2 (two) times a day.  . albuterol (VENTOLIN HFA) 108 (90 Base) MCG/ACT inhaler Inhale 2 puffs into the lungs every 6 (six) hours as needed for wheezing or shortness of breath.  . amphetamine-dextroamphetamine (ADDERALL) 20 MG tablet Take 20 mg by mouth daily as needed (ADD and/or hypersomnolence).   [name] apixaban (ELIQUIS) 5 MG TABS tablet Take 1 tablet (5 mg total) by mouth 2 (two) times daily.  . Ascorbic Acid (VITAMIN C PO) Take 1 tablet by mouth 3 (three) times daily.  . carvedilol (COREG) 25 MG tablet Take 1 tablet (25 mg total) by mouth 2 (two) times daily with a meal.  . furosemide (LASIX) 20 MG tablet Take 1 tablet (20 mg total) by mouth daily as needed for fluid.  [name] ondansetron (ZOFRAN) 8 MG tablet Take 4-8 mg by mouth every 8 (eight) hours as needed (nausea while traveling).  [name] oxymetazoline (AFRIN) 0.05 % nasal spray Place 1 spray into both nostrils daily.  . Polyethylene Glycol 400 (BLINK TEARS  OP) Place 1 drop into both eyes daily as needed (dry eyes).  . potassium chloride SA (K-DUR,KLOR-CON) 20 MEQ tablet Take 20 mEq by mouth at bedtime.   . pramipexole (MIRAPEX) 1.5 MG tablet Take 0.375-1.5 tablets by mouth at bedtime as needed (restless legs). 1/4-1 tablet  . VITAMIN A PO Take 1 capsule by mouth daily.      Allergies:   Lisinopril, Iodine, and Metoprolol   Social History   Socioeconomic History  . Marital status: Married    Spouse name: Not on file  . Number of children: Not on file  . Years of education: Not on file  . Highest  education level: Not on file  Occupational History  . Not on file  Social Needs  . Financial resource strain: Not hard at all  . Food insecurity    Worry: Not on file    Inability: Not on file  . Transportation needs    Medical: Not on file    Non-medical: Not on file  Tobacco Use  . Smoking status: Never Smoker  . Smokeless tobacco: Never Used  Substance and Sexual Activity  . Alcohol use: Never    Frequency: Never  . Drug use: Never  . Sexual activity: Yes    Partners: Male    Birth control/protection: None  Lifestyle  . Physical activity    Days per week: Not on file    Minutes per session: Not on file  . Stress: Not on file  Relationships  . Social Herbalist on phone: Not on file    Gets together: Not on file    Attends religious service: Not on file    Active member of club or organization: Not on file    Attends meetings of clubs or organizations: Not on file    Relationship status: Not on file  Other Topics Concern  . Not on file  Social History Narrative  . Not on file     Family History: The patient's family history includes Heart Problems in her father and mother; Heart attack in her sister; Hypertension in her mother. ROS:   Please see the history of present illness.    All 14 point review of systems negative except as described per history of present illness  EKGs/Labs/Other Studies Reviewed:      Recent Labs: 06/29/2018: ALT 14; Hemoglobin 12.7; Platelets 374; TSH 1.571 06/30/2018: Magnesium 2.4 08/06/2018: BUN 13; Creatinine, Ser 0.81; Potassium 4.5; Sodium 140  Recent Lipid Panel    Component Value Date/Time   CHOL 242 (H) 11/01/2017 1030   TRIG 175 (H) 11/01/2017 1030   HDL 47 11/01/2017 1030   CHOLHDL 5.1 (H) 11/01/2017 1030   LDLCALC 160 (H) 11/01/2017 1030    Physical Exam:    VS:  BP 122/72   Pulse 63   Ht 5\' 2"  (1.575 m)   Wt 138 lb 14.4 oz (63 kg)   SpO2 97%   BMI 25.41 kg/m     Wt Readings from Last 3 Encounters:   11/05/18 138 lb 14.4 oz (63 kg)  07/08/18 132 lb (59.9 kg)  06/30/18 131 lb 9.6 oz (59.7 kg)     GEN:  Well nourished, well developed in no acute distress HEENT: Normal NECK: No JVD; No carotid bruits LYMPHATICS: No lymphadenopathy CARDIAC: RRR, systolic murmur grade 2/6 to 3/6 best heard at right upper portion of the sternum, no rubs, no gallops RESPIRATORY:  Clear to auscultation without rales, wheezing or rhonchi  ABDOMEN: Soft, non-tender, non-distended MUSCULOSKELETAL:  No edema; No deformity  SKIN: Warm and dry LOWER EXTREMITIES: no swelling NEUROLOGIC:  Alert and oriented x 3 PSYCHIATRIC:  Normal affect   ASSESSMENT:    1. NICM (nonischemic cardiomyopathy) (HCC)   2. Paroxysmal atrial fibrillation ([place])   3. Nonrheumatic aortic valve stenosis    PLAN:    In order of problems listed above:  1. Nonischemic cardiomyopathy last echocardiogram showed normalization of left ventricular ejection fraction. 2. Paroxysmal atrial fibrillation.  Maintaining sinus rhythm.  Anticoagulated which I will continue.  Took herself off amiodarone scared of side effect.  Overall seems to be doing well from that point of view interestingly 2 episode of atrial fibrillation had was triggered by cold drinks. 3. Nonrheumatic aortic valve stenosis and worrying about getting critical.  On the physical examination I can still hear S2 however I suspect bicuspid valve is etiology of her aortic stenosis, heart murmur is late peaking.  I will schedule her to have another look at the valve to determine degree of stenosis.   Medication Adjustments/Labs and Tests Ordered: Current medicines are reviewed at length with the patient today.  Concerns regarding medicines are outlined above.  No orders of the defined types were placed in this encounter.  Medication changes: No orders of the defined types were placed in this encounter.   Signed, Park Liter, MD, [facility] 11/05/2018 8:48 AM    [place]

## 2018-11-27 ENCOUNTER — Encounter: Payer: Self-pay | Admitting: Cardiology

## 2018-11-27 ENCOUNTER — Other Ambulatory Visit: Payer: Self-pay

## 2018-11-27 ENCOUNTER — Ambulatory Visit (INDEPENDENT_AMBULATORY_CARE_PROVIDER_SITE_OTHER): Payer: Commercial Managed Care - PPO | Admitting: Cardiology

## 2018-11-27 VITALS — BP 130/70 | HR 68 | Ht 62.0 in | Wt 140.0 lb

## 2018-11-27 DIAGNOSIS — I340 Nonrheumatic mitral (valve) insufficiency: Secondary | ICD-10-CM

## 2018-11-27 DIAGNOSIS — R0609 Other forms of dyspnea: Secondary | ICD-10-CM | POA: Diagnosis not present

## 2018-11-27 DIAGNOSIS — E785 Hyperlipidemia, unspecified: Secondary | ICD-10-CM

## 2018-11-27 DIAGNOSIS — I480 Paroxysmal atrial fibrillation: Secondary | ICD-10-CM

## 2018-11-27 DIAGNOSIS — I350 Nonrheumatic aortic (valve) stenosis: Secondary | ICD-10-CM | POA: Diagnosis not present

## 2018-11-27 NOTE — Patient Instructions (Signed)
Medication Instructions:  Your physician recommends that you continue on your current medications as directed. Please refer to the Current Medication list given to you today.  If you need a refill on your cardiac medications before your next appointment, please call your pharmacy.   Lab work: None.  If you have labs (blood work) drawn today and your tests are completely normal, you will receive your results only by: [name] MyChart Message (if you have MyChart) OR . A paper copy in the mail If you have any lab test that is abnormal or we need to change your treatment, we will call you to review the results.  Testing/Procedures: Your physician has requested that you have a carotid duplex. This test is an ultrasound of the carotid arteries in your neck. It looks at blood flow through these arteries that supply the brain with blood. Allow one hour for this exam. There are no restrictions or special instructions.    Follow-Up: At [facility], you and your health needs are our priority.  As part of our continuing mission to provide you with exceptional heart care, we have created designated Provider Care Teams.  These Care Teams include your primary Cardiologist (physician) and Advanced Practice Providers (APPs -  Physician Assistants and Nurse Practitioners) who all work together to provide you with the care you need, when you need it. You will need a follow up appointment in 3 months.  Please call our office 2 months in advance to schedule this appointment.  You may see No primary care provider on file. or another member of our [employer] Provider Team in [place]: Shirlee More, MD . Jyl Heinz, MD  Any Other Special Instructions Will Be Listed Below (If Applicable).

## 2018-11-27 NOTE — Progress Notes (Signed)
Cardiology Office Note:    Date:  11/27/2018   ID:  Lindsay Guerrero, DOB 22-Nov-1955, MRN [id number]  PCP:  Lindsay Nasuti, MD  Cardiologist:  Lindsay Campus, MD    Referring MD: Lindsay Nasuti, MD   Chief Complaint  Patient presents with  . 1 month follow up  Doing same  History of Present Illness:    Lindsay Guerrero is a 63 y.o. female with aortic stenosis with being evaluated in pulmonary E.  She recently had echocardiogram and it looks like it she does have moderate aortic stenosis mean gradient was 25-dimensional index 0.27 calculated aortic valve area however is 0.77.  Appropriately today to talk about that.  Overall she does not have any shortness of breath she does not have any dizziness does not have any chest pain.  Described to have some numbness on the left side of her body that she developed 1 day.  I will schedule her to have carotid ultrasounds make sure she does not have any significant cardiac arterial disease.  We discussed again the issue of her aortic stenosis I told her if she develops shortness of breath chest pain dizziness she needs to let me know.  The only time she was dizzy was when she got numbness in the left side of her body.  She is already anticoagulated.  Bleeding complaint that she has right now is chronic back pain.  Past Medical History:  Diagnosis Date  . Anxiety state 11/19/2012   ICD10  . Elective surgery for purposes other than treating health conditions 09/27/2010   ICD10  . Inflamed seborrheic keratosis 08/08/2012  . Malignant neoplasm of upper-inner quadrant of female breast ([place]) 11/01/2017   Primary  diagnosis  . NICM (nonischemic cardiomyopathy) ([place]) 11/01/2015  . Precordial pain 11/01/2015  . Seborrheic keratoses 09/10/2016    Past Surgical History:  Procedure Laterality Date  . BREAST LUMPECTOMY    . CESAREAN SECTION    . COSMETIC SURGERY    . TONSILLECTOMY      Current Medications: Current Meds  Medication Sig  . acetaminophen  (TYLENOL) 500 MG tablet Take 500-1,000 mg by mouth every 6 (six) hours as needed for headache (pain).  . Acetylcysteine (NAC PO) Take 2-3 tablets by mouth 2 (two) times a day.  . albuterol (VENTOLIN HFA) 108 (90 Base) MCG/ACT inhaler Inhale 2 puffs into the lungs every 6 (six) hours as needed for wheezing or shortness of breath.  . amphetamine-dextroamphetamine (ADDERALL) 20 MG tablet Take 20 mg by mouth daily as needed (ADD and/or hypersomnolence).   [name] apixaban (ELIQUIS) 5 MG TABS tablet Take 1 tablet (5 mg total) by mouth 2 (two) times daily.  . Ascorbic Acid (VITAMIN C PO) Take 1 tablet by mouth 3 (three) times daily.  . carvedilol (COREG) 25 MG tablet Take 1 tablet (25 mg total) by mouth 2 (two) times daily with a meal.  . furosemide (LASIX) 20 MG tablet Take 1 tablet (20 mg total) by mouth daily as needed for fluid.  [name] ondansetron (ZOFRAN) 8 MG tablet Take 4-8 mg by mouth every 8 (eight) hours as needed (nausea while traveling).  [name] oxymetazoline (AFRIN) 0.05 % nasal spray Place 1 spray into both nostrils daily.  . Polyethylene Glycol 400 (BLINK TEARS OP) Place 1 drop into both eyes daily as needed (dry eyes).  . potassium chloride SA (K-DUR,KLOR-CON) 20 MEQ tablet Take 20 mEq by mouth at bedtime.   . pramipexole (MIRAPEX) 1.5 MG tablet Take 0.375-1.5 tablets by  mouth at bedtime as needed (restless legs). 1/4-1 tablet  . VITAMIN A PO Take 1 capsule by mouth daily.      Allergies:   Lisinopril, Iodine, and Metoprolol   Social History   Socioeconomic History  . Marital status: Married    Spouse name: Not on file  . Number of children: Not on file  . Years of education: Not on file  . Highest education level: Not on file  Occupational History  . Not on file  Social Needs  . Financial resource strain: Not hard at all  . Food insecurity    Worry: Not on file    Inability: Not on file  . Transportation needs    Medical: Not on file    Non-medical: Not on file  Tobacco Use  .  Smoking status: Never Smoker  . Smokeless tobacco: Never Used  Substance and Sexual Activity  . Alcohol use: Never    Frequency: Never  . Drug use: Never  . Sexual activity: Yes    Partners: Male    Birth control/protection: None  Lifestyle  . Physical activity    Days per week: Not on file    Minutes per session: Not on file  . Stress: Not on file  Relationships  . Social Herbalist on phone: Not on file    Gets together: Not on file    Attends religious service: Not on file    Active member of club or organization: Not on file    Attends meetings of clubs or organizations: Not on file    Relationship status: Not on file  Other Topics Concern  . Not on file  Social History Narrative  . Not on file     Family History: The patient's family history includes Heart Problems in her father and mother; Heart attack in her sister; Hypertension in her mother. ROS:   Please see the history of present illness.    All 14 point review of systems negative except as described per history of present illness  EKGs/Labs/Other Studies Reviewed:      Recent Labs: 06/29/2018: ALT 14; Hemoglobin 12.7; Platelets 374; TSH 1.571 06/30/2018: Magnesium 2.4 08/06/2018: BUN 13; Creatinine, Ser 0.81; Potassium 4.5; Sodium 140  Recent Lipid Panel    Component Value Date/Time   CHOL 242 (H) 11/01/2017 1030   TRIG 175 (H) 11/01/2017 1030   HDL 47 11/01/2017 1030   CHOLHDL 5.1 (H) 11/01/2017 1030   LDLCALC 160 (H) 11/01/2017 1030    Physical Exam:    VS:  BP 130/70   Pulse 68   Ht 5\' 2"  (1.575 m)   Wt 140 lb (63.5 kg)   SpO2 98%   BMI 25.61 kg/m     Wt Readings from Last 3 Encounters:  11/27/18 140 lb (63.5 kg)  11/05/18 138 lb 14.4 oz (63 kg)  07/08/18 132 lb (59.9 kg)     GEN:  Well nourished, well developed in no acute distress HEENT: Normal NECK: No JVD; soft carotid bruits bilaterally LYMPHATICS: No lymphadenopathy CARDIAC: RRR, systolic ejection murmur grade 3/6  best heard at the right upper portion of the sternum, no rubs, no gallops RESPIRATORY:  Clear to auscultation without rales, wheezing or rhonchi  ABDOMEN: Soft, non-tender, non-distended MUSCULOSKELETAL:  No edema; No deformity  SKIN: Warm and dry LOWER EXTREMITIES: no swelling NEUROLOGIC:  Alert and oriented x 3 PSYCHIATRIC:  Normal affect   ASSESSMENT:    1. Nonrheumatic aortic valve stenosis  2. Paroxysmal atrial fibrillation ([place])   3. Nonrheumatic mitral valve regurgitation   4. Dyspnea on exertion   5. Hyperlipidemia, unspecified hyperlipidemia type    PLAN:    In order of problems listed above:  1. Nonrheumatic aortic valve stenosis.  Appears to be moderate but not critical yet.  I see her back in about 3 months to reevaluate this issue.  I told her to let me know if she develops shortness of breath chest pain or dizziness. 2. Paroxysmal atrial fibrillation denies having any palpitations.  Anticoagulated which I will continue 3. Nonrheumatic mitral valve regurgitation not critical 4. Dyspnea on exertion unchanged 5. Dyslipidemia I asked her to have fasting lipid profile repeated 6. Carotic bruit with some numbness on the left side of her body.  Will do carotic ultrasound   Medication Adjustments/Labs and Tests Ordered: Current medicines are reviewed at length with the patient today.  Concerns regarding medicines are outlined above.  No orders of the defined types were placed in this encounter.  Medication changes: No orders of the defined types were placed in this encounter.   Signed, Park Liter, MD, [facility] 11/27/2018 10:35 AM    [place]

## 2018-11-28 ENCOUNTER — Ambulatory Visit: Payer: Commercial Managed Care - PPO | Admitting: Cardiology

## 2018-12-05 ENCOUNTER — Other Ambulatory Visit: Payer: Self-pay

## 2018-12-05 ENCOUNTER — Ambulatory Visit (HOSPITAL_BASED_OUTPATIENT_CLINIC_OR_DEPARTMENT_OTHER)
Admission: RE | Admit: 2018-12-05 | Discharge: 2018-12-05 | Disposition: A | Discharge status: Home / Self Care | Payer: Commercial Managed Care - PPO | Source: Ambulatory Visit | Attending: Cardiology | Admitting: Cardiology

## 2018-12-05 DIAGNOSIS — E785 Hyperlipidemia, unspecified: Secondary | ICD-10-CM | POA: Insufficient documentation

## 2018-12-05 NOTE — Progress Notes (Signed)
Bilateral carotid doppler performed   12/05/18 Cardell Peach RDCS, RVT

## 2018-12-08 ENCOUNTER — Telehealth: Payer: Self-pay | Admitting: Emergency Medicine

## 2018-12-08 NOTE — Telephone Encounter (Signed)
Left message for patient to return call for test results.

## 2018-12-26 ENCOUNTER — Ambulatory Visit: Payer: Commercial Managed Care - PPO | Admitting: Cardiology

## 2019-01-01 ENCOUNTER — Other Ambulatory Visit: Payer: Self-pay | Admitting: Cardiology

## 2019-02-25 ENCOUNTER — Encounter: Payer: Self-pay | Admitting: Cardiology

## 2019-02-25 ENCOUNTER — Other Ambulatory Visit: Payer: Self-pay

## 2019-02-25 ENCOUNTER — Ambulatory Visit (INDEPENDENT_AMBULATORY_CARE_PROVIDER_SITE_OTHER): Payer: Commercial Managed Care - PPO | Admitting: Cardiology

## 2019-02-25 VITALS — BP 122/70 | HR 65 | Ht 62.0 in | Wt 140.2 lb

## 2019-02-25 DIAGNOSIS — I10 Essential (primary) hypertension: Secondary | ICD-10-CM

## 2019-02-25 DIAGNOSIS — I350 Nonrheumatic aortic (valve) stenosis: Secondary | ICD-10-CM | POA: Diagnosis not present

## 2019-02-25 DIAGNOSIS — I340 Nonrheumatic mitral (valve) insufficiency: Secondary | ICD-10-CM | POA: Diagnosis not present

## 2019-02-25 DIAGNOSIS — I480 Paroxysmal atrial fibrillation: Secondary | ICD-10-CM | POA: Diagnosis not present

## 2019-02-25 NOTE — Addendum Note (Signed)
Addended by: Ashok Norris on: 02/25/2019 10:03 AM   Modules accepted: Orders

## 2019-02-25 NOTE — Progress Notes (Signed)
Cardiology Office Note:    Date:  02/25/2019   ID:  Lindsay Guerrero, DOB 11-25-1955, MRN [id number]  PCP:  Bonnita Nasuti, MD  Cardiologist:  Jenne Campus, MD    Referring MD: Bonnita Nasuti, MD   Chief Complaint  Patient presents with  . Follow-up  Doing well  History of Present Illness:    Lindsay Guerrero is a 63 y.o. female with aortic stenosis which is moderate.  Last estimation of August of this year.  She comes today for regular follow-up.  She is doing the same.  She does have some exertional shortness of breath but nothing unusual.  Denies having any chest pain no tightness pressure squeezing burning in the chest, no palpitations no dizziness no passing out.  We talked in length about her problem and she needs another echocardiogram which I will schedule her to have we probably will be able to do it in January.  I want her bed again about potential symptoms of significant aortic stenosis pending more shortness of breath passing out or chest pain.  She will let me know if she develops any of those.  Past Medical History:  Diagnosis Date  . Anxiety state 11/19/2012   ICD10  . Elective surgery for purposes other than treating health conditions 09/27/2010   ICD10  . Inflamed seborrheic keratosis 08/08/2012  . Malignant neoplasm of upper-inner quadrant of female breast ([facility]) 11/01/2017   Primary  diagnosis  . NICM (nonischemic cardiomyopathy) ([facility]) 11/01/2015  . Precordial pain 11/01/2015  . Seborrheic keratoses 09/10/2016    Past Surgical History:  Procedure Laterality Date  . BREAST LUMPECTOMY    . CESAREAN SECTION    . COSMETIC SURGERY    . TONSILLECTOMY      Current Medications: Current Meds  Medication Sig  . acetaminophen (TYLENOL) 500 MG tablet Take 500-1,000 mg by mouth every 6 (six) hours as needed for headache (pain).  . Acetylcysteine (NAC PO) Take 2-3 tablets by mouth 2 (two) times a day.  . albuterol (VENTOLIN HFA) 108 (90 Base) MCG/ACT inhaler Inhale 2 puffs  into the lungs every 6 (six) hours as needed for wheezing or shortness of breath.  . amphetamine-dextroamphetamine (ADDERALL) 20 MG tablet Take 20 mg by mouth daily as needed (ADD and/or hypersomnolence).   . Ascorbic Acid (VITAMIN C PO) Take 1 tablet by mouth 3 (three) times daily.  . carvedilol (COREG) 25 MG tablet TAKE 1 TABLET BY MOUTH  TWICE DAILY WITH A MEAL  . ELIQUIS 5 MG TABS tablet TAKE 1 TABLET BY MOUTH  TWICE DAILY  . furosemide (LASIX) 20 MG tablet Take 1 tablet (20 mg total) by mouth daily as needed for fluid.  [name] ondansetron (ZOFRAN) 8 MG tablet Take 4-8 mg by mouth every 8 (eight) hours as needed (nausea while traveling).  [name] oxymetazoline (AFRIN) 0.05 % nasal spray Place 1 spray into both nostrils daily.  . Polyethylene Glycol 400 (BLINK TEARS OP) Place 1 drop into both eyes daily as needed (dry eyes).  . potassium chloride SA (K-DUR,KLOR-CON) 20 MEQ tablet Take 20 mEq by mouth at bedtime.   . pramipexole (MIRAPEX) 1.5 MG tablet Take 0.375-1.5 tablets by mouth at bedtime as needed (restless legs). 1/4-1 tablet  . VITAMIN A PO Take 1 capsule by mouth daily.      Allergies:   Lisinopril, Iodine, and Metoprolol   Social History   Socioeconomic History  . Marital status: Married    Spouse name: Not on file  . Number  of children: Not on file  . Years of education: Not on file  . Highest education level: Not on file  Occupational History  . Not on file  Tobacco Use  . Smoking status: Never Smoker  . Smokeless tobacco: Never Used  Substance and Sexual Activity  . Alcohol use: Never  . Drug use: Never  . Sexual activity: Yes    Partners: Male    Birth control/protection: None  Other Topics Concern  . Not on file  Social History Narrative  . Not on file   Social Determinants of Health   Financial Resource Strain: Low Risk   . Difficulty of Paying Living Expenses: Not hard at all  Food Insecurity:   . Worried About Charity fundraiser in the Last Year: Not on file    . Ran Out of Food in the Last Year: Not on file  Transportation Needs:   . Lack of Transportation (Medical): Not on file  . Lack of Transportation (Non-Medical): Not on file  Physical Activity:   . Days of Exercise per Week: Not on file  . Minutes of Exercise per Session: Not on file  Stress:   . Feeling of Stress : Not on file  Social Connections:   . Frequency of Communication with Friends and Family: Not on file  . Frequency of Social Gatherings with Friends and Family: Not on file  . Attends Religious Services: Not on file  . Active Member of Clubs or Organizations: Not on file  . Attends Archivist Meetings: Not on file  . Marital Status: Not on file     Family History: The patient's family history includes Heart Problems in her father and mother; Heart attack in her sister; Hypertension in her mother. ROS:   Please see the history of present illness.    All 14 point review of systems negative except as described per history of present illness  EKGs/Labs/Other Studies Reviewed:      Recent Labs: 06/29/2018: ALT 14; Hemoglobin 12.7; Platelets 374; TSH 1.571 06/30/2018: Magnesium 2.4 08/06/2018: BUN 13; Creatinine, Ser 0.81; Potassium 4.5; Sodium 140  Recent Lipid Panel    Component Value Date/Time   CHOL 242 (H) 11/01/2017 1030   TRIG 175 (H) 11/01/2017 1030   HDL 47 11/01/2017 1030   CHOLHDL 5.1 (H) 11/01/2017 1030   LDLCALC 160 (H) 11/01/2017 1030    Physical Exam:    VS:  BP 122/70   Pulse 65   Ht 5\' 2"  (1.575 m)   Wt 140 lb 3.2 oz (63.6 kg)   SpO2 97%   BMI 25.64 kg/m     Wt Readings from Last 3 Encounters:  02/25/19 140 lb 3.2 oz (63.6 kg)  11/27/18 140 lb (63.5 kg)  11/05/18 138 lb 14.4 oz (63 kg)     GEN:  Well nourished, well developed in no acute distress HEENT: Normal NECK: No JVD; No carotid bruits LYMPHATICS: No lymphadenopathy CARDIAC: RRR, systolic ejection murmur grade 2/6 best heard right upper portion of the sternum, no rubs,  no gallops RESPIRATORY:  Clear to auscultation without rales, wheezing or rhonchi  ABDOMEN: Soft, non-tender, non-distended MUSCULOSKELETAL:  No edema; No deformity  SKIN: Warm and dry LOWER EXTREMITIES: no swelling NEUROLOGIC:  Alert and oriented x 3 PSYCHIATRIC:  Normal affect   ASSESSMENT:    1. Nonrheumatic aortic valve stenosis   2. Essential hypertension   3. Paroxysmal atrial fibrillation (HCC)   4. Nonrheumatic mitral valve regurgitation    PLAN:  In order of problems listed above:  1. Nonrheumatic aortic valve stenosis we will do another echocardiogram. 2. Essential hypertension blood pressure appears to be controlled continue present management. 3. Paroxysmal atrial fibrillation she had 2 episode of atrial fibrillation since I seen her last time is very interesting because her atrial fibrillation is always triggered by taking some cold drinks or ice cream.  Of course I told her not to drink cold fluids and hopefully by doing this will be able to prevent episode of atrial fibrillation. 4. Nonrheumatic mitral valve regurgitation.  Not critical.  We will repeat echocardiogram to reassess the lesion   Medication Adjustments/Labs and Tests Ordered: Current medicines are reviewed at length with the patient today.  Concerns regarding medicines are outlined above.  No orders of the defined types were placed in this encounter.  Medication changes: No orders of the defined types were placed in this encounter.   Signed, Park Liter, MD, [facility] 02/25/2019 9:57 AM    [place]

## 2019-02-25 NOTE — Patient Instructions (Signed)
Medication Instructions:  Your physician recommends that you continue on your current medications as directed. Please refer to the Current Medication list given to you today.  *If you need a refill on your cardiac medications before your next appointment, please call your pharmacy*  Lab Work: None.  If you have labs (blood work) drawn today and your tests are completely normal, you will receive your results only by: . MyChart Message (if you have MyChart) OR . A paper copy in the mail If you have any lab test that is abnormal or we need to change your treatment, we will call you to review the results.  Testing/Procedures: Your physician has requested that you have an echocardiogram. Echocardiography is a painless test that uses sound waves to create images of your heart. It provides your doctor with information about the size and shape of your heart and how well your heart's chambers and valves are working. This procedure takes approximately one hour. There are no restrictions for this procedure.    Follow-Up: At [facility], you and your health needs are our priority.  As part of our continuing mission to provide you with exceptional heart care, we have created designated Provider Care Teams.  These Care Teams include your primary Cardiologist (physician) and Advanced Practice Providers (APPs -  Physician Assistants and Nurse Practitioners) who all work together to provide you with the care you need, when you need it.  Your next appointment:   4 month(s)  The format for your next appointment:   In Person  Provider:   Robert Krasowski, MD  Other Instructions   Echocardiogram An echocardiogram is a procedure that uses painless sound waves (ultrasound) to produce an image of the heart. Images from an echocardiogram can provide important information about:  Signs of coronary artery disease (CAD).  Aneurysm detection. An aneurysm is a weak or damaged part of an artery wall that  bulges out from the normal force of blood pumping through the body.  Heart size and shape. Changes in the size or shape of the heart can be associated with certain conditions, including heart failure, aneurysm, and CAD.  Heart muscle function.  Heart valve function.  Signs of a past heart attack.  Fluid buildup around the heart.  Thickening of the heart muscle.  A tumor or infectious growth around the heart valves. Tell a health care provider about:  Any allergies you have.  All medicines you are taking, including vitamins, herbs, eye drops, creams, and over-the-counter medicines.  Any blood disorders you have.  Any surgeries you have had.  Any medical conditions you have.  Whether you are pregnant or may be pregnant. What are the risks? Generally, this is a safe procedure. However, problems may occur, including:  Allergic reaction to dye (contrast) that may be used during the procedure. What happens before the procedure? No specific preparation is needed. You may eat and drink normally. What happens during the procedure?   An IV tube may be inserted into one of your veins.  You may receive contrast through this tube. A contrast is an injection that improves the quality of the pictures from your heart.  A gel will be applied to your chest.  A wand-like tool (transducer) will be moved over your chest. The gel will help to transmit the sound waves from the transducer.  The sound waves will harmlessly bounce off of your heart to allow the heart images to be captured in real-time motion. The images will be recorded   on a computer. The procedure may vary among health care providers and hospitals. What happens after the procedure?  You may return to your normal, everyday life, including diet, activities, and medicines, unless your health care provider tells you not to do that. Summary  An echocardiogram is a procedure that uses painless sound waves (ultrasound) to produce  an image of the heart.  Images from an echocardiogram can provide important information about the size and shape of your heart, heart muscle function, heart valve function, and fluid buildup around your heart.  You do not need to do anything to prepare before this procedure. You may eat and drink normally.  After the echocardiogram is completed, you may return to your normal, everyday life, unless your health care provider tells you not to do that. This information is not intended to replace advice given to you by your health care provider. Make sure you discuss any questions you have with your health care provider. Document Released: 02/24/2000 Document Revised: 06/19/2018 Document Reviewed: 03/31/2016 Elsevier Patient Education  2020 Elsevier Inc.   

## 2019-04-23 ENCOUNTER — Other Ambulatory Visit: Payer: Self-pay

## 2019-04-23 ENCOUNTER — Ambulatory Visit (INDEPENDENT_AMBULATORY_CARE_PROVIDER_SITE_OTHER): Payer: Commercial Managed Care - PPO

## 2019-04-23 DIAGNOSIS — I350 Nonrheumatic aortic (valve) stenosis: Secondary | ICD-10-CM | POA: Diagnosis not present

## 2019-04-23 NOTE — Progress Notes (Signed)
Complete echocardiogram has been performed.  Jimmy Zaden Sako RDCS, RVT 

## 2019-05-08 ENCOUNTER — Telehealth (INDEPENDENT_AMBULATORY_CARE_PROVIDER_SITE_OTHER): Payer: Commercial Managed Care - PPO | Admitting: Cardiology

## 2019-05-08 ENCOUNTER — Other Ambulatory Visit: Payer: Self-pay

## 2019-05-08 ENCOUNTER — Encounter: Payer: Self-pay | Admitting: Cardiology

## 2019-05-08 VITALS — BP 131/76 | HR 73 | Wt 138.0 lb

## 2019-05-08 DIAGNOSIS — I350 Nonrheumatic aortic (valve) stenosis: Secondary | ICD-10-CM | POA: Diagnosis not present

## 2019-05-08 DIAGNOSIS — Z7901 Long term (current) use of anticoagulants: Secondary | ICD-10-CM

## 2019-05-08 DIAGNOSIS — Z7189 Other specified counseling: Secondary | ICD-10-CM

## 2019-05-08 DIAGNOSIS — I340 Nonrheumatic mitral (valve) insufficiency: Secondary | ICD-10-CM

## 2019-05-08 DIAGNOSIS — I10 Essential (primary) hypertension: Secondary | ICD-10-CM | POA: Diagnosis not present

## 2019-05-08 DIAGNOSIS — R0600 Dyspnea, unspecified: Secondary | ICD-10-CM

## 2019-05-08 DIAGNOSIS — R0609 Other forms of dyspnea: Secondary | ICD-10-CM

## 2019-05-08 DIAGNOSIS — E785 Hyperlipidemia, unspecified: Secondary | ICD-10-CM

## 2019-05-08 DIAGNOSIS — I480 Paroxysmal atrial fibrillation: Secondary | ICD-10-CM | POA: Diagnosis not present

## 2019-05-08 MED ORDER — DIPHENHYDRAMINE HCL 50 MG PO TABS: ORAL_TABLET | ORAL | 0 refills | Status: DC

## 2019-05-08 MED ORDER — PREDNISONE 50 MG PO TABS: ORAL_TABLET | ORAL | 0 refills | Status: DC

## 2019-05-08 NOTE — Patient Instructions (Addendum)
Medication Instructions:  Your physician recommends that you continue on your current medications as directed. Please refer to the Current Medication list given to you today.  *If you need a refill on your cardiac medications before your next appointment, please call your pharmacy*   Lab Work: Your physician recommends that you return for lab work: cmp, bmp   If you have labs (blood work) drawn today and your tests are completely normal, you will receive your results only by: [name] MyChart Message (if you have MyChart) OR . A paper copy in the mail If you have any lab test that is abnormal or we need to change your treatment, we will call you to review the results.   Testing/Procedures: A chest x-ray takes a picture of the organs and structures inside the chest, including the heart, lungs, and blood vessels. This test can show several things, including, whether the heart is enlarges; whether fluid is building up in the lungs; and whether pacemaker / defibrillator leads are still in place.     [place] [facility], [place] [address] Dept: [phone] Loc: [phone]  Thurley Gaitor  05/08/2019  You are scheduled for a Cardiac Catheterization on Wednesday, March 10 with Dr. [name].  1. Please arrive at the [facility] (Main Entrance A) at [facility]: [address] [place], [place] [address] at 6:30 AM (This time is two hours before your procedure to ensure your preparation). Free valet parking service is available.   Special note: Every effort is made to have your procedure done on time. Please understand that emergencies sometimes delay scheduled procedures.  2. Diet: Do not eat solid foods after midnight.  The patient may have clear liquids until 5am upon the day of the procedure.  3. Labs: You will need to have blood drawn  4. Medication instructions in  preparation for your procedure:   Contrast Allergy: Yes, Please take Prednisone 50mg  by mouth at: Thirteen hours prior to cath 7:00pm on Tuesday (7:30pm) Seven hours prior to cath 1:00am on Wednesday (1:30am) And prior to leaving home please take last dose of Prednisone 50mg  and Benadryl 50mg  by mouth.   Hold lasix the day of the procedure  Hold eliquis 2 days before procedure    5. Plan for one night stay--bring personal belongings. 6. Bring a current list of your medications and current insurance cards. 7. You MUST have a responsible person to drive you home. 8. Someone MUST be with you the first 24 hours after you arrive home or your discharge will be delayed. 9. Please wear clothes that are easy to get on and off and wear slip-on shoes.  Thank you for allowing [place] to care for you!   -- [place] [facility]    Follow-Up: At [facility], you and your health needs are our priority.  As part of our continuing mission to provide you with exceptional heart care, we have created designated Provider Care Teams.  These Care Teams include your primary Cardiologist (physician) and Advanced Practice Providers (APPs -  Physician Assistants and Nurse Practitioners) who all work together to provide you with the care you need, when you need it.  We recommend signing up for the patient portal called "MyChart".  Sign up information is provided on this After Visit Summary.  MyChart is used to connect with patients for Virtual Visits (Telemedicine).  Patients are able to view  lab/test results, encounter notes, upcoming appointments, etc.  Non-urgent messages can be sent to your provider as well.   To learn more about what you can do with MyChart, go to [email or web address].    Your next appointment:   1 month(s)  The format for your next appointment:   In Person  Provider:   Jenne Campus, MD   Other Instructions    Coronary Angiogram With Stent Coronary  angiogram with stent placement is a procedure to widen or open a narrow blood vessel of the heart (coronary artery). Arteries may become blocked by cholesterol buildup (plaques) in the lining of the artery wall. When a coronary artery becomes partially blocked, blood flow to that area decreases. This may lead to chest pain or a heart attack (myocardial infarction). A stent is a small piece of metal that looks like mesh or spring. Stent placement may be done as treatment after a heart attack, or to prevent a heart attack if a blocked artery is found by a coronary angiogram. Let your health care provider know about:  Any allergies you have, including allergies to medicines or contrast dye.  All medicines you are taking, including vitamins, herbs, eye drops, creams, and over-the-counter medicines.  Any problems you or family members have had with anesthetic medicines.  Any blood disorders you have.  Any surgeries you have had.  Any medical conditions you have, including kidney problems or kidney failure.  Whether you are pregnant or may be pregnant.  Whether you are breastfeeding. What are the risks? Generally, this is a safe procedure. However, serious problems may occur, including:  Damage to nearby structures or organs, such as the heart, blood vessels, or kidneys.  A return of blockage.  Bleeding, infection, or bruising at the insertion site.  A collection of blood under the skin (hematoma) at the insertion site.  A blood clot in another part of the body.  Allergic reaction to medicines or dyes.  Bleeding into the abdomen (retroperitoneal bleeding).  Stroke (rare).  Heart attack (rare). What happens before the procedure? Staying hydrated Follow instructions from your health care provider about hydration, which may include:  Up to 2 hours before the procedure - you may continue to drink clear liquids, such as water, clear fruit juice, black coffee, and plain tea.  Eating  and drinking restrictions Follow instructions from your health care provider about eating and drinking, which may include:  8 hours before the procedure - stop eating heavy meals or foods, such as meat, fried foods, or fatty foods.  6 hours before the procedure - stop eating light meals or foods, such as toast or cereal.  2 hours before the procedure - stop drinking clear liquids. Medicines Ask your health care provider about:  Changing or stopping your regular medicines. This is especially important if you are taking diabetes medicines or blood thinners.  Taking medicines such as aspirin and ibuprofen. These medicines can thin your blood. Do not take these medicines unless your health care provider tells you to take them. ? Generally, aspirin is recommended before a thin tube, called a catheter, is passed through a blood vessel and inserted into the heart (cardiac catheterization).  Taking over-the-counter medicines, vitamins, herbs, and supplements. General instructions  Do not use any products that contain nicotine or tobacco for at least 4 weeks before the procedure. These products include cigarettes, e-cigarettes, and chewing tobacco. If you need help quitting, ask your health care provider.  Plan to have someone take you home  from the hospital or clinic.  If you will be going home right after the procedure, plan to have someone with you for 24 hours.  You may have tests and imaging procedures.  Ask your health care provider: ? How your insertion site will be marked. Ask which artery will be used for the procedure. ? What steps will be taken to help prevent infection. These may include:  Removing hair at the insertion site.  Washing skin with a germ-killing soap.  Taking antibiotic medicine. What happens during the procedure?   An IV will be inserted into one of your veins.  Electrodes may be placed on your chest to monitor your heart rate during the procedure.  You  will be given one or more of the following: ? A medicine to help you relax (sedative). ? A medicine to numb the area (local anesthetic) for catheter insertion.  A small incision will be made for catheter insertion.  The catheter will be inserted into an artery using a guide wire. The location may be in your groin, your wrist, or the fold of your arm (near your elbow).  An X-ray procedure (fluoroscopy) will be used to help guide the catheter to the opening of the heart arteries.  A dye will be injected into the catheter. X-rays will be taken. The dye helps to show where any narrowing or blockages are located in the arteries.  Tell your health care provider if you have chest pain or trouble breathing.  A tiny wire will be guided to the blocked spot, and a balloon will be inflated to make the artery wider.  The stent will be expanded to crush the plaques into the wall of the vessel. The stent will hold the area open and improve the blood flow. Most stents have a drug coating to reduce the risk of the stent narrowing over time.  The artery may be made wider using a drill, laser, or other tools that remove plaques.  The catheter will be removed when the blood flow improves. The stent will stay where it was placed, and the lining of the artery will grow over it.  A bandage (dressing) will be placed on the insertion site. Pressure will be applied to stop bleeding.  The IV will be removed. This procedure may vary among health care providers and hospitals. What happens after the procedure?  Your blood pressure, heart rate, breathing rate, and blood oxygen level will be monitored until you leave the hospital or clinic.  If the procedure is done through the leg, you will lie flat in bed for a few hours or for as long as told by your health care provider. You will be instructed not to bend or cross your legs.  The insertion site and the pulse in your foot or wrist will be checked often.  You  may have more blood tests, X-rays, and a test that records the electrical activity of your heart (electrocardiogram, or ECG).  Do not drive for 24 hours if you were given a sedative during your procedure. Summary  Coronary angiogram with stent placement is a procedure to widen or open a narrowed coronary artery. This is done to treat heart problems.  Before the procedure, let your health care provider know about all the medical conditions and surgeries you have or have had.  This is a safe procedure. However, some problems may occur, including damage to nearby structures or organs, bleeding, blood clots, or allergies.  Follow your health care provider's  instructions about eating, drinking, medicines, and other lifestyle changes, such as quitting tobacco use before the procedure. This information is not intended to replace advice given to you by your health care provider. Make sure you discuss any questions you have with your health care provider. Document Revised: 09/17/2018 Document Reviewed: 09/17/2018 Elsevier Patient Education  [employer].

## 2019-05-08 NOTE — Progress Notes (Signed)
Virtual Visit via Telephone Note   This visit type was conducted due to national recommendations for restrictions regarding the COVID-19 Pandemic (e.g. social distancing) in an effort to limit this patient's exposure and mitigate transmission in our community.  Due to her co-morbid illnesses, this patient is at least at moderate risk for complications without adequate follow up.  This format is felt to be most appropriate for this patient at this time.  The patient did not have access to video technology/had technical difficulties with video requiring transitioning to audio format only (telephone).  All issues noted in this document were discussed and addressed.  No physical exam could be performed with this format.  Please refer to the patient's chart for her  consent to telehealth for [facility].  Evaluation Performed:  Follow-up visit  This visit type was conducted due to national recommendations for restrictions regarding the COVID-19 Pandemic (e.g. social distancing).  This format is felt to be most appropriate for this patient at this time.  All issues noted in this document were discussed and addressed.  No physical exam was performed (except for noted visual exam findings with Video Visits).  Please refer to the patient's chart (MyChart message for video visits and phone note for telephone visits) for the patient's consent to telehealth for [facility].  Date:  05/08/2019  ID: Lindsay Guerrero, DOB Mar 12, 1956, MRN [id number]   Patient Location: [place] [address]   Provider location:   [place] Office  PCP:  Bonnita Nasuti, MD  Cardiologist:  Jenne Campus, MD     Chief Complaint: I am more short of breath  History of Present Illness:    Lindsay Guerrero is a 64 y.o. female  who presents via audio/video conferencing for a telehealth visit today.  Medical history significant for aortic stenosis that had been following for years, recent  echocardiogram showed progression of the problem to now level of severe aortic stenosis, also paroxysmal atrial fibrillation, anticoagulated, essential hypertension, history of cardiomyopathy, however latest left ventricle ejection fraction showed normal ejection fraction.  She does have a televisit with me today to talk about her valve.  She reports to have worsening of her symptoms she said now she got more shortness of breath she is getting tired more easily also she got some episode of dizziness that are not related to positional changes.  She mentioned that she does have those kind of symptoms dizziness for long time already.  Again echocardiogram has been done showed worsening of the gradient.  Her peak and mean gradient right now is 60/36 mmHg calculated aortic valve area 0.73-dimensional index 0.23.   The patient does not have symptoms concerning for COVID-19 infection (fever, chills, cough, or new SHORTNESS OF BREATH).    Prior CV studies:   The following studies were reviewed today:  Echocardiogram from 23 April 2019 showed:   1. Left ventricular ejection fraction, by estimation, is 65 to 70%. The  left ventricle has normal function. The left ventrical has no regional  wall motion abnormalities. Left ventricular diastolic parameters are  consistent with Grade II diastolic  dysfunction (pseudonormalization).  2. Right ventricular systolic function is normal. The right ventricular  size is normal. There is normal pulmonary artery systolic pressure.  3. Left atrial size was mildly dilated. No left atrial/left atrial  appendage thrombus was detected.  4. The mitral valve is normal in structure and function. trivial mitral  valve regurgitation. No evidence of mitral  stenosis.  5. Progression of AS noted. August 2020: peak/mean gradient39/24 mmHg,  AVA 0.66, DI 0.27. Now Peak/mean gradient - 60/36 mmHg, AVA 0.73, DI -  0.23. The aortic valve is grossly normal. Aortic valve  regurgitation is  mild. Moderate to severe aortic valve  stenosis.  6. The inferior vena cava is normal in size with greater than 50%  respiratory variability, suggesting right atrial pressure of 3 mmHg.      Past Medical History:  Diagnosis Date  . Anxiety state 11/19/2012   ICD10  . Elective surgery for purposes other than treating health conditions 09/27/2010   ICD10  . Inflamed seborrheic keratosis 08/08/2012  . Malignant neoplasm of upper-inner quadrant of female breast ([place]) 11/01/2017   Primary  diagnosis  . NICM (nonischemic cardiomyopathy) ([place]) 11/01/2015  . Precordial pain 11/01/2015  . Seborrheic keratoses 09/10/2016    Past Surgical History:  Procedure Laterality Date  . BREAST LUMPECTOMY    . CESAREAN SECTION    . COSMETIC SURGERY    . TONSILLECTOMY       Current Meds  Medication Sig  . acetaminophen (TYLENOL) 500 MG tablet Take 500-1,000 mg by mouth every 6 (six) hours as needed for headache (pain).  . Acetylcysteine (NAC PO) Take 2-3 tablets by mouth 2 (two) times a day.  . albuterol (VENTOLIN HFA) 108 (90 Base) MCG/ACT inhaler Inhale 2 puffs into the lungs every 6 (six) hours as needed for wheezing or shortness of breath.  . amphetamine-dextroamphetamine (ADDERALL) 20 MG tablet Take 20 mg by mouth daily as needed (ADD and/or hypersomnolence).   . Ascorbic Acid (VITAMIN C PO) Take 1 tablet by mouth daily as needed (when she remembers).   . carvedilol (COREG) 25 MG tablet TAKE 1 TABLET BY MOUTH  TWICE DAILY WITH A MEAL  . ELIQUIS 5 MG TABS tablet TAKE 1 TABLET BY MOUTH  TWICE DAILY  . furosemide (LASIX) 20 MG tablet Take 1 tablet (20 mg total) by mouth daily as needed for fluid.  [name] ondansetron (ZOFRAN) 8 MG tablet Take 4-8 mg by mouth every 8 (eight) hours as needed (nausea while traveling).  [name] oxymetazoline (AFRIN) 0.05 % nasal spray Place 1 spray into both nostrils daily.  . Polyethylene Glycol 400 (BLINK TEARS OP) Place 1 drop into both eyes daily as needed (dry  eyes).  . potassium chloride SA (K-DUR,KLOR-CON) 20 MEQ tablet Take 20 mEq by mouth daily as needed (when takes lasix).   . pramipexole (MIRAPEX) 1.5 MG tablet Take 0.375-1.5 tablets by mouth at bedtime as needed (restless legs). 1/4-1 tablet  . VITAMIN A PO Take 1 capsule by mouth daily.       Family History: The patient's family history includes Heart Problems in her father and mother; Heart attack in her sister; Hypertension in her mother.   ROS:   Please see the history of present illness.     All other systems reviewed and are negative.   Labs/Other Tests and Data Reviewed:     Recent Labs: 06/29/2018: ALT 14; Hemoglobin 12.7; Platelets 374; TSH 1.571 06/30/2018: Magnesium 2.4 08/06/2018: BUN 13; Creatinine, Ser 0.81; Potassium 4.5; Sodium 140  Recent Lipid Panel    Component Value Date/Time   CHOL 242 (H) 11/01/2017 1030   TRIG 175 (H) 11/01/2017 1030   HDL 47 11/01/2017 1030   CHOLHDL 5.1 (H) 11/01/2017 1030   LDLCALC 160 (H) 11/01/2017 1030      Exam:    Vital Signs:  BP 131/76   Pulse  73   Wt 138 lb (62.6 kg)   BMI 25.24 kg/m     Wt Readings from Last 3 Encounters:  05/08/19 138 lb (62.6 kg)  02/25/19 140 lb 3.2 oz (63.6 kg)  11/27/18 140 lb (63.5 kg)     Well nourished, well developed in no acute distress. Alert awake and x3 talking to me over the phone.  Unable to establish video link.  She is not in any distress denies have any chest pain shortness of breath tightness pressure burning in the chest while talking to me.  Diagnosis for this visit:   1. Nonrheumatic aortic valve stenosis   2. Nonrheumatic mitral valve regurgitation   3. Paroxysmal atrial fibrillation (HCC)   4. Essential hypertension   5. Dyspnea on exertion      ASSESSMENT & PLAN:    1.  Nonrheumatic aortic valve stenosis which appears to be significant/severe now.  She also have worsening of her symptoms.  We had a long discussion today about options for this situation and I  told her that we are reaching the point that aortic valve replacement need to be considered.  We discussed open heart surgery versus TAVR.  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  We did talk about pros and cons, briefly with talk about mechanical biological prosthesis, we also talk about processes that need to happen before decision is made which way to proceed.  We talked about cardiac catheterization.  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  She agreed to proceed with cardiac catheterization.  We will do left on the right side cardiac catheterization, I will also refer her to Dr. Burt Knack to continue conversation about the best way to approach her problem. She does have history of coronary artery disease already, in 2017 she was found to have ejection fraction of 40%, at that time she had cardiac catheterization done which showed nonobstructive disease. 2.  Nonrheumatic mitral valve regurgitation only trivial on last echocardiogram. 3.  Paroxysmal atrial fibrillation maintaining sinus rhythm, her chads 2 Vascor is 3, she is anticoagulated which I will continue. 4.  Essential hypertension controlled. 5.  Dyspnea on exertion most likely related to aortic stenosis. 6.  Dyslipidemia: We will schedule her to have fasting lipid profile.  However, previously she repeatedly refused any statin or treatment for dyslipidemia.   COVID-19 Education: The signs and symptoms of COVID-19 were discussed with the patient and how to seek care for testing (follow up with PCP or arrange E-visit).  The importance of social distancing was discussed today.  Patient Risk:   After full review of this patients clinical status, I feel that they are at least moderate risk at this time.  Time:   Today, I have spent 15 5 minutes with the patient with telehealth technology discussing pt health issues.  I spent 25 minutes reviewing her chart  before the visit.  Visit was finished at 8:30 AM.    Medication Adjustments/Labs and Tests Ordered: Current medicines are reviewed at length with the patient today.  Concerns regarding medicines are outlined above.  No orders of the defined types were placed in this encounter.  Medication changes: No orders of the defined types were placed in this encounter.    Disposition: Follow-up in 3 months  Signed, Park Liter, MD, [facility] 05/08/2019 8:31 AM    [place]

## 2019-05-14 ENCOUNTER — Encounter: Payer: Self-pay | Admitting: Cardiology

## 2019-05-14 ENCOUNTER — Other Ambulatory Visit: Payer: Self-pay

## 2019-05-14 ENCOUNTER — Ambulatory Visit (INDEPENDENT_AMBULATORY_CARE_PROVIDER_SITE_OTHER): Payer: Commercial Managed Care - PPO | Admitting: Cardiology

## 2019-05-14 VITALS — BP 116/72 | HR 66 | Temp 97.5°F | Ht 62.0 in | Wt 139.2 lb

## 2019-05-14 DIAGNOSIS — I10 Essential (primary) hypertension: Secondary | ICD-10-CM

## 2019-05-14 DIAGNOSIS — I480 Paroxysmal atrial fibrillation: Secondary | ICD-10-CM

## 2019-05-14 DIAGNOSIS — Z01810 Encounter for preprocedural cardiovascular examination: Secondary | ICD-10-CM

## 2019-05-14 DIAGNOSIS — I350 Nonrheumatic aortic (valve) stenosis: Secondary | ICD-10-CM

## 2019-05-14 DIAGNOSIS — E785 Hyperlipidemia, unspecified: Secondary | ICD-10-CM

## 2019-05-14 NOTE — Progress Notes (Signed)
Cardiology Office Note:    Date:  05/14/2019   ID:  Lindsay Guerrero, DOB 1955-06-21, MRN [id number]  PCP:  Bonnita Nasuti, MD  Cardiologist:  Jenne Campus, MD    Referring MD: Bonnita Nasuti, MD   No chief complaint on file. Follow-up aortic stenosis  History of Present Illness:    Lindsay Guerrero is a 64 y.o. female with past medical history significant for aortic stenosis and to the point that there is significant.  Latest data showing peak to mean gradient of 60/36 mmHg, calculated aortic valve area 1.73 cm and dimensional index being 0.23.  She also complained of having some symptoms of shortness of breath and fatigue.  This is gradually progressing.  Additional past medical history significant for paroxysmal atrial fibrillation she is anticoagulated, essential hypertension, also history of cardiomyopathy with latest normalization of left ventricle ejection fraction.  She is scheduled to have a cardiac catheterization next week which will be left in the right to assess her coronary arteries as well as check the pressures and verify severity of aortic stenosis.  Past Medical History:  Diagnosis Date  . Anxiety state 11/19/2012   ICD10  . Aortic stenosis 12/13/2017   Moderate by echocardiogram from 2019 mean gradient 25  . Atrial fibrillation ([place]) 06/28/2018  . Dyspnea on exertion 11/01/2017  . Elective surgery for purposes other than treating health conditions 09/27/2010   ICD10  . Essential hypertension 06/30/2018  . History of breast cancer 06/30/2018  . Hyperlipidemia 06/30/2018  . Hypokalemia 06/30/2018  . Inflamed seborrheic keratosis 08/08/2012  . Malignant neoplasm of upper-inner quadrant of female breast ([place]) 11/01/2017   Primary  diagnosis  . Mitral regurgitation 12/13/2017   Mild to moderate based on echo from 2019  . NICM (nonischemic cardiomyopathy) ([place]) 11/01/2015  . Precordial pain 11/01/2015  . Seborrheic keratoses 09/10/2016    Past Surgical History:  Procedure  Laterality Date  . BREAST LUMPECTOMY    . CESAREAN SECTION    . COSMETIC SURGERY    . TONSILLECTOMY      Current Medications: Current Meds  Medication Sig  . acetaminophen (TYLENOL) 500 MG tablet Take 500-1,000 mg by mouth every 6 (six) hours as needed for headache (pain).  . Acetylcysteine (NAC PO) Take 2-3 tablets by mouth 2 (two) times a day.  . albuterol (VENTOLIN HFA) 108 (90 Base) MCG/ACT inhaler Inhale 2 puffs into the lungs every 6 (six) hours as needed for wheezing or shortness of breath.  . amphetamine-dextroamphetamine (ADDERALL) 20 MG tablet Take 20 mg by mouth daily as needed (ADD and/or hypersomnolence).   . carvedilol (COREG) 25 MG tablet TAKE 1 TABLET BY MOUTH  TWICE DAILY WITH A MEAL  . diphenhydrAMINE (BENADRYL) 50 MG tablet Take one tablet before leaving home the morning of the cath.  [name] ELIQUIS 5 MG TABS tablet TAKE 1 TABLET BY MOUTH  TWICE DAILY  . furosemide (LASIX) 20 MG tablet Take 1 tablet (20 mg total) by mouth daily as needed for fluid.  [name] ondansetron (ZOFRAN) 8 MG tablet Take 4-8 mg by mouth every 8 (eight) hours as needed (nausea while traveling).  [name] oxymetazoline (AFRIN) 0.05 % nasal spray Place 1 spray into both nostrils daily.  . potassium chloride SA (K-DUR,KLOR-CON) 20 MEQ tablet Take 20 mEq by mouth daily as needed (when takes lasix).   . pramipexole (MIRAPEX) 1.5 MG tablet Take 0.375-1.5 tablets by mouth at bedtime as needed (restless legs). 1/4-1 tablet  . predniSONE (DELTASONE) 50 MG  tablet Take one tablet 13 hours before cath, take one tablet 7 hours before cath, take one tablet the morning of cath prior to leaving home.  [name] VITAMIN A PO Take 1 capsule by mouth daily.      Allergies:   Lisinopril, Iodine, and Metoprolol   Social History   Socioeconomic History  . Marital status: Married    Spouse name: Not on file  . Number of children: Not on file  . Years of education: Not on file  . Highest education level: Not on file  Occupational History   . Not on file  Tobacco Use  . Smoking status: Never Smoker  . Smokeless tobacco: Never Used  Substance and Sexual Activity  . Alcohol use: Yes    Comment: occasional social drinking (once per month)   . Drug use: Never  . Sexual activity: Yes    Partners: Male    Birth control/protection: None  Other Topics Concern  . Not on file  Social History Narrative  . Not on file   Social Determinants of Health   Financial Resource Strain: Low Risk   . Difficulty of Paying Living Expenses: Not hard at all  Food Insecurity:   . Worried About Charity fundraiser in the Last Year: Not on file  . Ran Out of Food in the Last Year: Not on file  Transportation Needs:   . Lack of Transportation (Medical): Not on file  . Lack of Transportation (Non-Medical): Not on file  Physical Activity:   . Days of Exercise per Week: Not on file  . Minutes of Exercise per Session: Not on file  Stress:   . Feeling of Stress : Not on file  Social Connections:   . Frequency of Communication with Friends and Family: Not on file  . Frequency of Social Gatherings with Friends and Family: Not on file  . Attends Religious Services: Not on file  . Active Member of Clubs or Organizations: Not on file  . Attends Archivist Meetings: Not on file  . Marital Status: Not on file     Family History: The patient's family history includes Heart Problems in her father and mother; Heart attack in her sister; Hypertension in her mother. ROS:   Please see the history of present illness.    All 14 point review of systems negative except as described per history of present illness  EKGs/Labs/Other Studies Reviewed:      Recent Labs: 06/29/2018: ALT 14; Hemoglobin 12.7; Platelets 374; TSH 1.571 06/30/2018: Magnesium 2.4 08/06/2018: BUN 13; Creatinine, Ser 0.81; Potassium 4.5; Sodium 140  Recent Lipid Panel    Component Value Date/Time   CHOL 242 (H) 11/01/2017 1030   TRIG 175 (H) 11/01/2017 1030   HDL 47  11/01/2017 1030   CHOLHDL 5.1 (H) 11/01/2017 1030   LDLCALC 160 (H) 11/01/2017 1030    Physical Exam:    VS:  BP 116/72   Pulse 66   Temp (!) 97.5 F (36.4 C)   Ht 5\' 2"  (1.575 m)   Wt 139 lb 3.2 oz (63.1 kg)   LMP  (LMP Unknown)   SpO2 96%   BMI 25.46 kg/m     Wt Readings from Last 3 Encounters:  05/14/19 139 lb 3.2 oz (63.1 kg)  05/08/19 138 lb (62.6 kg)  02/25/19 140 lb 3.2 oz (63.6 kg)     GEN:  Well nourished, well developed in no acute distress HEENT: Normal NECK: No JVD; No carotid bruits  LYMPHATICS: No lymphadenopathy CARDIAC: RRR, systolic murmur grade 3/6 best heard at the right upper portion of the sternum., no rubs, no gallops RESPIRATORY:  Clear to auscultation without rales, wheezing or rhonchi  ABDOMEN: Soft, non-tender, non-distended MUSCULOSKELETAL:  No edema; No deformity  SKIN: Warm and dry LOWER EXTREMITIES: no swelling NEUROLOGIC:  Alert and oriented x 3 PSYCHIATRIC:  Normal affect   ASSESSMENT:    1. Paroxysmal atrial fibrillation (HCC)   2. Pre-procedural cardiovascular examination   3. Nonrheumatic aortic valve stenosis   4. Essential hypertension   5. Hyperlipidemia, unspecified hyperlipidemia type    PLAN:    In order of problems listed above:  1. Paroxysmal atrial fibrillation.  Maintained sinus rhythm we will continue present management which include anticoagulation. 2. Normal cortical stenosis.  She is scheduled to have a cardiac catheterization next week we discussed this again today explained procedure including all risk benefits as well as alternatives.  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 3. Essential hypertension blood pressure well controlled continue present management. 4. Dyslipidemia is very reluctant on taking any medication for it.  Hopefully she will change her mind after cardiac catheterization.   Medication  Adjustments/Labs and Tests Ordered: Current medicines are reviewed at length with the patient today.  Concerns regarding medicines are outlined above.  Orders Placed This Encounter  Procedures  . EKG 12-Lead   Medication changes: No orders of the defined types were placed in this encounter.   Signed, Park Liter, MD, [facility] 05/14/2019 11:35 AM    [place]

## 2019-05-14 NOTE — Patient Instructions (Signed)

## 2019-05-14 NOTE — H&P (View-Only) (Signed)
Cardiology Office Note:    Date:  05/14/2019   ID:  Lindsay Guerrero, DOB Jul 27, 1955, MRN [id number]  PCP:  Bonnita Nasuti, MD  Cardiologist:  Jenne Campus, MD    Referring MD: Bonnita Nasuti, MD   No chief complaint on file. Follow-up aortic stenosis  History of Present Illness:    Lindsay Guerrero is a 64 y.o. female with past medical history significant for aortic stenosis and to the point that there is significant.  Latest data showing peak to mean gradient of 60/36 mmHg, calculated aortic valve area 1.73 cm and dimensional index being 0.23.  She also complained of having some symptoms of shortness of breath and fatigue.  This is gradually progressing.  Additional past medical history significant for paroxysmal atrial fibrillation she is anticoagulated, essential hypertension, also history of cardiomyopathy with latest normalization of left ventricle ejection fraction.  She is scheduled to have a cardiac catheterization next week which will be left in the right to assess her coronary arteries as well as check the pressures and verify severity of aortic stenosis.  Past Medical History:  Diagnosis Date  . Anxiety state 11/19/2012   ICD10  . Aortic stenosis 12/13/2017   Moderate by echocardiogram from 2019 mean gradient 25  . Atrial fibrillation ([place]) 06/28/2018  . Dyspnea on exertion 11/01/2017  . Elective surgery for purposes other than treating health conditions 09/27/2010   ICD10  . Essential hypertension 06/30/2018  . History of breast cancer 06/30/2018  . Hyperlipidemia 06/30/2018  . Hypokalemia 06/30/2018  . Inflamed seborrheic keratosis 08/08/2012  . Malignant neoplasm of upper-inner quadrant of female breast ([place]) 11/01/2017   Primary  diagnosis  . Mitral regurgitation 12/13/2017   Mild to moderate based on echo from 2019  . NICM (nonischemic cardiomyopathy) ([place]) 11/01/2015  . Precordial pain 11/01/2015  . Seborrheic keratoses 09/10/2016    Past Surgical History:  Procedure  Laterality Date  . BREAST LUMPECTOMY    . CESAREAN SECTION    . COSMETIC SURGERY    . TONSILLECTOMY      Current Medications: Current Meds  Medication Sig  . acetaminophen (TYLENOL) 500 MG tablet Take 500-1,000 mg by mouth every 6 (six) hours as needed for headache (pain).  . Acetylcysteine (NAC PO) Take 2-3 tablets by mouth 2 (two) times a day.  . albuterol (VENTOLIN HFA) 108 (90 Base) MCG/ACT inhaler Inhale 2 puffs into the lungs every 6 (six) hours as needed for wheezing or shortness of breath.  . amphetamine-dextroamphetamine (ADDERALL) 20 MG tablet Take 20 mg by mouth daily as needed (ADD and/or hypersomnolence).   . carvedilol (COREG) 25 MG tablet TAKE 1 TABLET BY MOUTH  TWICE DAILY WITH A MEAL  . diphenhydrAMINE (BENADRYL) 50 MG tablet Take one tablet before leaving home the morning of the cath.  [name] ELIQUIS 5 MG TABS tablet TAKE 1 TABLET BY MOUTH  TWICE DAILY  . furosemide (LASIX) 20 MG tablet Take 1 tablet (20 mg total) by mouth daily as needed for fluid.  [name] ondansetron (ZOFRAN) 8 MG tablet Take 4-8 mg by mouth every 8 (eight) hours as needed (nausea while traveling).  [name] oxymetazoline (AFRIN) 0.05 % nasal spray Place 1 spray into both nostrils daily.  . potassium chloride SA (K-DUR,KLOR-CON) 20 MEQ tablet Take 20 mEq by mouth daily as needed (when takes lasix).   . pramipexole (MIRAPEX) 1.5 MG tablet Take 0.375-1.5 tablets by mouth at bedtime as needed (restless legs). 1/4-1 tablet  . predniSONE (DELTASONE) 50 MG  tablet Take one tablet 13 hours before cath, take one tablet 7 hours before cath, take one tablet the morning of cath prior to leaving home.  [name] VITAMIN A PO Take 1 capsule by mouth daily.      Allergies:   Lisinopril, Iodine, and Metoprolol   Social History   Socioeconomic History  . Marital status: Married    Spouse name: Not on file  . Number of children: Not on file  . Years of education: Not on file  . Highest education level: Not on file  Occupational History    . Not on file  Tobacco Use  . Smoking status: Never Smoker  . Smokeless tobacco: Never Used  Substance and Sexual Activity  . Alcohol use: Yes    Comment: occasional social drinking (once per month)   . Drug use: Never  . Sexual activity: Yes    Partners: Male    Birth control/protection: None  Other Topics Concern  . Not on file  Social History Narrative  . Not on file   Social Determinants of Health   Financial Resource Strain: Low Risk   . Difficulty of Paying Living Expenses: Not hard at all  Food Insecurity:   . Worried About Charity fundraiser in the Last Year: Not on file  . Ran Out of Food in the Last Year: Not on file  Transportation Needs:   . Lack of Transportation (Medical): Not on file  . Lack of Transportation (Non-Medical): Not on file  Physical Activity:   . Days of Exercise per Week: Not on file  . Minutes of Exercise per Session: Not on file  Stress:   . Feeling of Stress : Not on file  Social Connections:   . Frequency of Communication with Friends and Family: Not on file  . Frequency of Social Gatherings with Friends and Family: Not on file  . Attends Religious Services: Not on file  . Active Member of Clubs or Organizations: Not on file  . Attends Archivist Meetings: Not on file  . Marital Status: Not on file     Family History: The patient's family history includes Heart Problems in her father and mother; Heart attack in her sister; Hypertension in her mother. ROS:   Please see the history of present illness.    All 14 point review of systems negative except as described per history of present illness  EKGs/Labs/Other Studies Reviewed:      Recent Labs: 06/29/2018: ALT 14; Hemoglobin 12.7; Platelets 374; TSH 1.571 06/30/2018: Magnesium 2.4 08/06/2018: BUN 13; Creatinine, Ser 0.81; Potassium 4.5; Sodium 140  Recent Lipid Panel    Component Value Date/Time   CHOL 242 (H) 11/01/2017 1030   TRIG 175 (H) 11/01/2017 1030   HDL 47  11/01/2017 1030   CHOLHDL 5.1 (H) 11/01/2017 1030   LDLCALC 160 (H) 11/01/2017 1030    Physical Exam:    VS:  BP 116/72   Pulse 66   Temp (!) 97.5 F (36.4 C)   Ht 5\' 2"  (1.575 m)   Wt 139 lb 3.2 oz (63.1 kg)   LMP  (LMP Unknown)   SpO2 96%   BMI 25.46 kg/m     Wt Readings from Last 3 Encounters:  05/14/19 139 lb 3.2 oz (63.1 kg)  05/08/19 138 lb (62.6 kg)  02/25/19 140 lb 3.2 oz (63.6 kg)     GEN:  Well nourished, well developed in no acute distress HEENT: Normal NECK: No JVD; No carotid  bruits LYMPHATICS: No lymphadenopathy CARDIAC: RRR, systolic murmur grade 3/6 best heard at the right upper portion of the sternum., no rubs, no gallops RESPIRATORY:  Clear to auscultation without rales, wheezing or rhonchi  ABDOMEN: Soft, non-tender, non-distended MUSCULOSKELETAL:  No edema; No deformity  SKIN: Warm and dry LOWER EXTREMITIES: no swelling NEUROLOGIC:  Alert and oriented x 3 PSYCHIATRIC:  Normal affect   ASSESSMENT:    1. Paroxysmal atrial fibrillation (HCC)   2. Pre-procedural cardiovascular examination   3. Nonrheumatic aortic valve stenosis   4. Essential hypertension   5. Hyperlipidemia, unspecified hyperlipidemia type    PLAN:    In order of problems listed above:  1. Paroxysmal atrial fibrillation.  Maintained sinus rhythm we will continue present management which include anticoagulation. 2. Normal cortical stenosis.  She is scheduled to have a cardiac catheterization next week we discussed this again today explained procedure including all risk benefits as well as alternatives.  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 3. Essential hypertension blood pressure well controlled continue present management. 4. Dyslipidemia is very reluctant on taking any medication for it.  Hopefully she will change her mind after cardiac catheterization.   Medication  Adjustments/Labs and Tests Ordered: Current medicines are reviewed at length with the patient today.  Concerns regarding medicines are outlined above.  Orders Placed This Encounter  Procedures  . EKG 12-Lead   Medication changes: No orders of the defined types were placed in this encounter.   Signed, Park Liter, MD, [facility] 05/14/2019 11:35 AM    [place]

## 2019-05-15 LAB — BASIC METABOLIC PANEL
BUN/Creatinine Ratio: 23 (ref 12–28)
BUN: 17 mg/dL (ref 8–27)
CO2: 27 mmol/L (ref 20–29)
Calcium: 9.8 mg/dL (ref 8.7–10.3)
Chloride: 98 mmol/L (ref 96–106)
Creatinine, Ser: 0.73 mg/dL (ref 0.57–1.00)
GFR calc Af Amer: 100 mL/min/{1.73_m2} (ref >59)
GFR calc non Af Amer: 87 mL/min/{1.73_m2} (ref >59)
Glucose: 155 mg/dL — ABNORMAL HIGH (ref 65–99)
Potassium: 4.6 mmol/L (ref 3.5–5.2)
Sodium: 140 mmol/L (ref 134–144)

## 2019-05-15 LAB — CBC
Hematocrit: 36.9 % (ref 34.0–46.6)
Hemoglobin: 12.2 g/dL (ref 11.1–15.9)
MCH: 28.4 pg (ref 26.6–33.0)
MCHC: 33.1 g/dL (ref 31.5–35.7)
MCV: 86 fL (ref 79–97)
Platelets: 338 10*3/uL (ref 150–450)
RBC: 4.3 x10E6/uL (ref 3.77–5.28)
RDW: 13.2 % (ref 11.7–15.4)
WBC: 4.7 10*3/uL (ref 3.4–10.8)

## 2019-05-16 ENCOUNTER — Other Ambulatory Visit (HOSPITAL_COMMUNITY)
Admission: RE | Admit: 2019-05-16 | Discharge: 2019-05-16 | Disposition: A | Discharge status: Home / Self Care | Payer: Commercial Managed Care - PPO | Source: Ambulatory Visit | Attending: Cardiovascular Disease | Admitting: Cardiovascular Disease

## 2019-05-16 DIAGNOSIS — Z01812 Encounter for preprocedural laboratory examination: Secondary | ICD-10-CM | POA: Insufficient documentation

## 2019-05-16 DIAGNOSIS — Z20822 Contact with and (suspected) exposure to covid-19: Secondary | ICD-10-CM | POA: Diagnosis not present

## 2019-05-16 LAB — SARS CORONAVIRUS 2 (TAT 6-24 HRS): SARS Coronavirus 2: NEGATIVE

## 2019-05-19 ENCOUNTER — Encounter: Payer: Self-pay | Admitting: *Deleted

## 2019-05-19 ENCOUNTER — Telehealth: Payer: Self-pay | Admitting: *Deleted

## 2019-05-19 NOTE — Telephone Encounter (Signed)
Pt is aware that her support person can bring their support dog, with the appropriate documentation/paperwork,  to the hospital.

## 2019-05-19 NOTE — Telephone Encounter (Signed)
Pt contacted pre-catheterization scheduled at [facility] for: Wednesday May 20, 2019 8:30 AM Verified arrival time and place: [facility]) at: 6:30 AM   No solid food after midnight prior to cath, clear liquids until 5 AM day of procedure. Contrast allergy: yes-13 hour Prednisone and Benadryl Prep reviewed with patient. Prednisone 50 mg 05/19/19 7:30 PM Prednisone 50 mg 05/20/19 1:30 AM Prednisone 50 mg and Benadryl 50 mg just prior to leaving for hospital AM of procedure. Pt advised not to drive to the hospital.  Hold: Eliquis-none 05/18/19 until post procedure. Lasix/KCl -AM of procedure  Except hold medications AM meds can be  taken pre-cath with sip of water including: ASA 81 mg   Confirmed patient has responsible adult to drive home post procedure and observe 24 hours after arriving home: yes  Currently, due to Covid-19 pandemic, only one person will be allowed with patient. Must be the same person for patient's entire stay and will be required to wear a mask. They will be asked to wait in the waiting room for the duration of the patient's stay.  Patients are required to wear a mask when they enter the hospital.      COVID-19 Pre-Screening Questions:  . In the past 7 to 10 days have you had a cough,  shortness of breath, headache, congestion, fever (100 or greater) body aches, chills, sore throat, or sudden loss of taste or sense of smell? no . Have you been around anyone with known Covid 19 in the past 7-10 days? no . Have you been around anyone who is awaiting Covid 19 test results in the past 7 to 10 days?  no . Have you been around anyone who has been exposed to Covid 19, or has mentioned symptoms of Covid 19 within the past 7 to 10 days? no  I reviewed procedure/mask/visitor instructions, COVID-19 screening questions with patient, she verbalized understanding, thanked me for call.

## 2019-05-20 ENCOUNTER — Ambulatory Visit (HOSPITAL_COMMUNITY)
Admission: RE | Admit: 2019-05-20 | Discharge: 2019-05-20 | Disposition: A | Discharge status: Home / Self Care | Payer: Commercial Managed Care - PPO | Attending: Cardiovascular Disease | Admitting: Cardiovascular Disease

## 2019-05-20 ENCOUNTER — Other Ambulatory Visit: Payer: Self-pay

## 2019-05-20 ENCOUNTER — Ambulatory Visit (HOSPITAL_COMMUNITY)
Admission: RE | Disposition: A | Discharge status: Home / Self Care | Payer: Commercial Managed Care - PPO | Source: Home / Self Care | Attending: Cardiovascular Disease

## 2019-05-20 DIAGNOSIS — I350 Nonrheumatic aortic (valve) stenosis: Secondary | ICD-10-CM

## 2019-05-20 DIAGNOSIS — I2510 Atherosclerotic heart disease of native coronary artery without angina pectoris: Secondary | ICD-10-CM | POA: Diagnosis not present

## 2019-05-20 DIAGNOSIS — I480 Paroxysmal atrial fibrillation: Secondary | ICD-10-CM | POA: Diagnosis not present

## 2019-05-20 DIAGNOSIS — Z888 Allergy status to other drugs, medicaments and biological substances status: Secondary | ICD-10-CM | POA: Diagnosis not present

## 2019-05-20 DIAGNOSIS — I10 Essential (primary) hypertension: Secondary | ICD-10-CM | POA: Diagnosis not present

## 2019-05-20 DIAGNOSIS — I428 Other cardiomyopathies: Secondary | ICD-10-CM | POA: Diagnosis not present

## 2019-05-20 DIAGNOSIS — E785 Hyperlipidemia, unspecified: Secondary | ICD-10-CM | POA: Insufficient documentation

## 2019-05-20 DIAGNOSIS — Z8249 Family history of ischemic heart disease and other diseases of the circulatory system: Secondary | ICD-10-CM | POA: Insufficient documentation

## 2019-05-20 DIAGNOSIS — Z79899 Other long term (current) drug therapy: Secondary | ICD-10-CM | POA: Diagnosis not present

## 2019-05-20 DIAGNOSIS — Z7901 Long term (current) use of anticoagulants: Secondary | ICD-10-CM | POA: Diagnosis not present

## 2019-05-20 HISTORY — PX: RIGHT/LEFT HEART CATH AND CORONARY ANGIOGRAPHY: CATH118266

## 2019-05-20 LAB — POCT I-STAT 7, (LYTES, BLD GAS, ICA,H+H)
Acid-base deficit: 1 mmol/L (ref 0.0–2.0)
Bicarbonate: 24.6 mmol/L (ref 20.0–28.0)
Calcium, Ion: 1.12 mmol/L — ABNORMAL LOW (ref 1.15–1.40)
HCT: 36 % (ref 36.0–46.0)
Hemoglobin: 12.2 g/dL (ref 12.0–15.0)
O2 Saturation: 98 %
Potassium: 3.5 mmol/L (ref 3.5–5.1)
Sodium: 141 mmol/L (ref 135–145)
TCO2: 26 mmol/L (ref 22–32)
pCO2 arterial: 41.5 mmHg (ref 32.0–48.0)
pH, Arterial: 7.381 (ref 7.350–7.450)
pO2, Arterial: 107 mmHg (ref 83.0–108.0)

## 2019-05-20 LAB — POCT I-STAT EG7
Acid-Base Excess: 1 mmol/L (ref 0.0–2.0)
Bicarbonate: 26.4 mmol/L (ref 20.0–28.0)
Calcium, Ion: 1.17 mmol/L (ref 1.15–1.40)
HCT: 37 % (ref 36.0–46.0)
Hemoglobin: 12.6 g/dL (ref 12.0–15.0)
O2 Saturation: 78 %
Potassium: 3.8 mmol/L (ref 3.5–5.1)
Sodium: 140 mmol/L (ref 135–145)
TCO2: 28 mmol/L (ref 22–32)
pCO2, Ven: 45.9 mmHg (ref 44.0–60.0)
pH, Ven: 7.368 (ref 7.250–7.430)
pO2, Ven: 44 mmHg (ref 32.0–45.0)

## 2019-05-20 SURGERY — RIGHT/LEFT HEART CATH AND CORONARY ANGIOGRAPHY
Anesthesia: LOCAL

## 2019-05-20 MED ORDER — SODIUM CHLORIDE 0.9 % WEIGHT BASED INFUSION: 3.0000 mL/kg/h | INTRAVENOUS | Status: AC

## 2019-05-20 MED ORDER — LIDOCAINE HCL (PF) 1 % IJ SOLN
INTRAMUSCULAR | Status: DC | PRN
  Administered 2019-05-20 (×2): 2 mL
  Administered 2019-05-20: 15 mL

## 2019-05-20 MED ORDER — IOHEXOL 350 MG/ML SOLN
INTRAVENOUS | Status: DC | PRN
  Administered 2019-05-20: 10:00:00 20 mL

## 2019-05-20 MED ORDER — ASPIRIN 81 MG PO CHEW: 81.0000 mg | CHEWABLE_TABLET | ORAL | Status: AC

## 2019-05-20 MED ORDER — ONDANSETRON HCL 4 MG/2ML IJ SOLN: 4.0000 mg | Freq: Four times a day (QID) | INTRAMUSCULAR | Status: DC | PRN

## 2019-05-20 MED ORDER — HEPARIN (PORCINE) IN NACL 1000-0.9 UT/500ML-% IV SOLN
INTRAVENOUS | Status: DC | PRN
  Administered 2019-05-20 (×2): 500 mL

## 2019-05-20 MED ORDER — VERAPAMIL HCL 2.5 MG/ML IV SOLN
INTRAVENOUS | Status: DC | PRN
  Administered 2019-05-20: 10 mL via INTRA_ARTERIAL

## 2019-05-20 MED ORDER — MIDAZOLAM HCL 2 MG/2ML IJ SOLN
INTRAMUSCULAR | Status: AC
  Filled 2019-05-20: qty 2

## 2019-05-20 MED ORDER — SODIUM CHLORIDE 0.9% FLUSH: 3.0000 mL | INTRAVENOUS | Status: DC | PRN

## 2019-05-20 MED ORDER — LIDOCAINE HCL (PF) 1 % IJ SOLN
INTRAMUSCULAR | Status: AC
  Filled 2019-05-20: qty 30

## 2019-05-20 MED ORDER — SODIUM CHLORIDE 0.9% FLUSH: 3.0000 mL | Freq: Two times a day (BID) | INTRAVENOUS | Status: DC

## 2019-05-20 MED ORDER — HEPARIN SODIUM (PORCINE) 1000 UNIT/ML IJ SOLN
INTRAMUSCULAR | Status: AC
  Filled 2019-05-20: qty 1

## 2019-05-20 MED ORDER — ASPIRIN 81 MG PO CHEW
CHEWABLE_TABLET | ORAL | Status: AC
  Administered 2019-05-20: 81 mg via ORAL
  Filled 2019-05-20: qty 1

## 2019-05-20 MED ORDER — HEPARIN (PORCINE) IN NACL 1000-0.9 UT/500ML-% IV SOLN
INTRAVENOUS | Status: AC
  Filled 2019-05-20: qty 1000

## 2019-05-20 MED ORDER — MIDAZOLAM HCL 2 MG/2ML IJ SOLN
INTRAMUSCULAR | Status: DC | PRN
  Administered 2019-05-20: 1 mg via INTRAVENOUS
  Administered 2019-05-20: 2 mg via INTRAVENOUS

## 2019-05-20 MED ORDER — SODIUM CHLORIDE 0.9 % WEIGHT BASED INFUSION: 1.0000 mL/kg/h | INTRAVENOUS | Status: DC

## 2019-05-20 MED ORDER — SODIUM CHLORIDE 0.9 % IV SOLN: 250.0000 mL | INTRAVENOUS | Status: DC | PRN

## 2019-05-20 MED ORDER — ACETAMINOPHEN 325 MG PO TABS: 650.0000 mg | ORAL_TABLET | ORAL | Status: DC | PRN

## 2019-05-20 MED ORDER — HYDRALAZINE HCL 20 MG/ML IJ SOLN: 10.0000 mg | INTRAMUSCULAR | Status: DC | PRN

## 2019-05-20 MED ORDER — FENTANYL CITRATE (PF) 100 MCG/2ML IJ SOLN
INTRAMUSCULAR | Status: DC | PRN
  Administered 2019-05-20 (×2): 25 ug via INTRAVENOUS

## 2019-05-20 MED ORDER — VERAPAMIL HCL 2.5 MG/ML IV SOLN
INTRAVENOUS | Status: AC
  Filled 2019-05-20: qty 2

## 2019-05-20 MED ORDER — FENTANYL CITRATE (PF) 100 MCG/2ML IJ SOLN
INTRAMUSCULAR | Status: AC
  Filled 2019-05-20: qty 2

## 2019-05-20 SURGICAL SUPPLY — 17 items
CATH 5FR JL3.5 JR4 ANG PIG MP (CATHETERS) ×1 IMPLANT
CATH BALLN WEDGE 5F 110CM (CATHETERS) ×1 IMPLANT
DEVICE RAD COMP TR BAND LRG (VASCULAR PRODUCTS) ×1 IMPLANT
GLIDESHEATH SLEND SS 6F .021 (SHEATH) ×1 IMPLANT
GUIDEWIRE ANGLED .035X150CM (WIRE) ×1 IMPLANT
GUIDEWIRE INQWIRE 1.5J.035X260 (WIRE) IMPLANT
INQWIRE 1.5J .035X260CM (WIRE) ×2
KIT HEART LEFT (KITS) ×2 IMPLANT
PACK CARDIAC CATHETERIZATION (CUSTOM PROCEDURE TRAY) ×2 IMPLANT
SHEATH GLIDE SLENDER 4/5FR (SHEATH) ×1 IMPLANT
SHEATH PINNACLE 5F 10CM (SHEATH) ×1 IMPLANT
SHEATH PROBE COVER 6X72 (BAG) ×1 IMPLANT
SYR MEDRAD MARK 7 150ML (SYRINGE) ×2 IMPLANT
TRANSDUCER W/STOPCOCK (MISCELLANEOUS) ×2 IMPLANT
TUBING CIL FLEX 10 FLL-RA (TUBING) ×2 IMPLANT
WIRE EMERALD 3MM-J .035X150CM (WIRE) ×1 IMPLANT
WIRE HI TORQ VERSACORE-J 145CM (WIRE) ×1 IMPLANT

## 2019-05-20 NOTE — Discharge Instructions (Signed)
Radial Site Care ° °This sheet gives you information about how to care for yourself after your procedure. Your health care provider may also give you more specific instructions. If you have problems or questions, contact your health care provider. °What can I expect after the procedure? °After the procedure, it is common to have: °· Bruising and tenderness at the catheter insertion area. °Follow these instructions at home: °Medicines °· Take over-the-counter and prescription medicines only as told by your health care provider. °Insertion site care °· Follow instructions from your health care provider about how to take care of your insertion site. Make sure you: °? Wash your hands with soap and water before you change your bandage (dressing). If soap and water are not available, use hand sanitizer. °? Change your dressing as told by your health care provider. °? Leave stitches (sutures), skin glue, or adhesive strips in place. These skin closures may need to stay in place for 2 weeks or longer. If adhesive strip edges start to loosen and curl up, you may trim the loose edges. Do not remove adhesive strips completely unless your health care provider tells you to do that. °· Check your insertion site every day for signs of infection. Check for: °? Redness, swelling, or pain. °? Fluid or blood. °? Pus or a bad smell. °? Warmth. °· Do not take baths, swim, or use a hot tub until your health care provider approves. °· You may shower 24-48 hours after the procedure, or as directed by your health care provider. °? Remove the dressing and gently wash the site with plain soap and water. °? Pat the area dry with a clean towel. °? Do not rub the site. That could cause bleeding. °· Do not apply powder or lotion to the site. °Activity ° °· For 24 hours after the procedure, or as directed by your health care provider: °? Do not flex or bend the affected arm. °? Do not push or pull heavy objects with the affected arm. °? Do not  drive yourself home from the hospital or clinic. You may drive 24 hours after the procedure unless your health care provider tells you not to. °? Do not operate machinery or power tools. °· Do not lift anything that is heavier than 10 lb (4.5 kg), or the limit that you are told, until your health care provider says that it is safe. °· Ask your health care provider when it is okay to: °? Return to work or school. °? Resume usual physical activities or sports. °? Resume sexual activity. °General instructions °· If the catheter site starts to bleed, raise your arm and put firm pressure on the site. If the bleeding does not stop, get help right away. This is a medical emergency. °· If you went home on the same day as your procedure, a responsible adult should be with you for the first 24 hours after you arrive home. °· Keep all follow-up visits as told by your health care provider. This is important. °Contact a health care provider if: °· You have a fever. °· You have redness, swelling, or yellow drainage around your insertion site. °Get help right away if: °· You have unusual pain at the radial site. °· The catheter insertion area swells very fast. °· The insertion area is bleeding, and the bleeding does not stop when you hold steady pressure on the area. °· Your arm or hand becomes pale, cool, tingly, or numb. °These symptoms may represent a serious problem   that is an emergency. Do not wait to see if the symptoms will go away. Get medical help right away. Call your local emergency services (911 in the [place] Do not drive yourself to the hospital. Summary  After the procedure, it is common to have bruising and tenderness at the site.  Follow instructions from your health care provider about how to take care of your radial site wound. Check the wound every day for signs of infection.  Do not lift anything that is heavier than 10 lb (4.5 kg), or the limit that you are told, until your health care provider says  that it is safe. This information is not intended to replace advice given to you by your health care provider. Make sure you discuss any questions you have with your health care provider. Document Revised: 04/03/2017 Document Reviewed: 04/03/2017 Elsevier Patient Education  2020 [place] After This sheet gives you information about how to care for yourself after your procedure. Your doctor may also give you more specific instructions. If you have problems or questions, contact your doctor. Follow these instructions at home: Insertion site care  Follow instructions from your doctor about how to take care of your long, thin tube (catheter) insertion area. Make sure you: ? Wash your hands with soap and water before you change your bandage (dressing). If you cannot use soap and water, use hand sanitizer. ? Change your bandage as told by your doctor. ? Leave stitches (sutures), skin glue, or skin tape (adhesive) strips in place. They may need to stay in place for 2 weeks or longer. If tape strips get loose and curl up, you may trim the loose edges. Do not remove tape strips completely unless your doctor says it is okay.  Do not take baths, swim, or use a hot tub until your doctor says it is okay.  You may shower 24-48 hours after the procedure or as told by your doctor. ? Gently wash the area with plain soap and water. ? Pat the area dry with a clean towel. ? Do not rub the area. This may cause bleeding.  Do not apply powder or lotion to the area. Keep the area clean and dry.  Check your insertion area every day for signs of infection. Check for: ? More redness, swelling, or pain. ? Fluid or blood. ? Warmth. ? Pus or a bad smell. Activity  Rest as told by your doctor, usually for 1-2 days.  Do not lift anything that is heavier than 10 lbs. (4.5 kg) or as told by your doctor.  Do not drive for 24 hours if you were given a medicine to help you relax  (sedative).  Do not drive or use heavy machinery while taking prescription pain medicine. General instructions   Go back to your normal activities as told by your doctor, usually in about a week. Ask your doctor what activities are safe for you.  If the insertion area starts to bleed, lie flat and put pressure on the area. If the bleeding does not stop, get help right away. This is an emergency.  Drink enough fluid to keep your pee (urine) clear or pale yellow.  Take over-the-counter and prescription medicines only as told by your doctor.  Keep all follow-up visits as told by your doctor. This is important. Contact a doctor if:  You have a fever.  You have chills.  You have more redness, swelling, or pain around your insertion area.  You  have fluid or blood coming from your insertion area.  The insertion area feels warm to the touch.  You have pus or a bad smell coming from your insertion area.  You have more bruising around the insertion area.  Blood collects in the tissue around the insertion area (hematoma) that may be painful to the touch. Get help right away if:  You have a lot of pain in the insertion area.  The insertion area swells very fast.  The insertion area is bleeding, and the bleeding does not stop after holding steady pressure on the area.  The area near or just beyond the insertion area becomes pale, cool, tingly, or numb. These symptoms may be an emergency. Do not wait to see if the symptoms will go away. Get medical help right away. Call your local emergency services (911 in the [place] Do not drive yourself to the hospital. Summary  After the procedure, it is common to have bruising and tenderness at the long, thin tube insertion area.  After the procedure, it is important to rest and drink plenty of fluids.  Do not take baths, swim, or use a hot tub until your doctor says it is okay to do so. You may shower 24-48 hours after the procedure or as told  by your doctor.  If the insertion area starts to bleed, lie flat and put pressure on the area. If the bleeding does not stop, get help right away. This is an emergency. This information is not intended to replace advice given to you by your health care provider. Make sure you discuss any questions you have with your health care provider. Document Revised: 02/08/2017 Document Reviewed: 02/21/2016 Elsevier Patient Education  2020 [employer].

## 2019-05-20 NOTE — Interval H&P Note (Signed)
History and Physical Interval Note:  05/20/2019 8:34 AM  Lindsay Guerrero  has presented today for surgery, with the diagnosis of Aortic stenosis.  The various methods of treatment have been discussed with the patient and family. After consideration of risks, benefits and other options for treatment, the patient has consented to  Procedure(s): RIGHT/LEFT HEART CATH AND CORONARY ANGIOGRAPHY (N/A) as a surgical intervention.  The patient's history has been reviewed, patient examined, no change in status, stable for surgery.  I have reviewed the patient's chart and labs.  Questions were answered to the patient's satisfaction.     [name]

## 2019-05-21 MED FILL — Heparin Sodium (Porcine) Inj 1000 Unit/ML: INTRAMUSCULAR | Qty: 10 | Status: AC

## 2019-05-22 ENCOUNTER — Other Ambulatory Visit: Payer: Self-pay

## 2019-05-22 MED ORDER — PREDNISONE 50 MG PO TABS: ORAL_TABLET | ORAL | 0 refills | Status: DC

## 2019-05-25 ENCOUNTER — Other Ambulatory Visit: Payer: Self-pay

## 2019-05-25 ENCOUNTER — Encounter (HOSPITAL_COMMUNITY): Payer: Self-pay

## 2019-05-25 ENCOUNTER — Ambulatory Visit (HOSPITAL_COMMUNITY)
Admission: RE | Admit: 2019-05-25 | Discharge: 2019-05-25 | Disposition: A | Discharge status: Home / Self Care | Payer: Commercial Managed Care - PPO | Source: Ambulatory Visit | Attending: Cardiovascular Disease | Admitting: Cardiovascular Disease

## 2019-05-25 DIAGNOSIS — I350 Nonrheumatic aortic (valve) stenosis: Secondary | ICD-10-CM | POA: Diagnosis not present

## 2019-05-25 MED ORDER — IOHEXOL 350 MG/ML SOLN
95.0000 mL | Freq: Once | INTRAVENOUS | Status: AC | PRN
  Administered 2019-05-25: 95 mL via INTRAVENOUS

## 2019-05-27 ENCOUNTER — Institutional Professional Consult (permissible substitution) (INDEPENDENT_AMBULATORY_CARE_PROVIDER_SITE_OTHER): Payer: Commercial Managed Care - PPO | Admitting: Surgery

## 2019-05-27 ENCOUNTER — Other Ambulatory Visit: Payer: Self-pay

## 2019-05-27 ENCOUNTER — Encounter: Payer: Self-pay | Admitting: Surgery

## 2019-05-27 VITALS — BP 126/81 | HR 68 | Temp 97.7°F | Resp 20 | Ht 61.5 in | Wt 142.0 lb

## 2019-05-27 DIAGNOSIS — I350 Nonrheumatic aortic (valve) stenosis: Secondary | ICD-10-CM | POA: Diagnosis not present

## 2019-05-27 NOTE — Progress Notes (Signed)
Patient ID: Lindsay Guerrero, female   DOB: [birth year], 64 y.o.   MRN: [id number]  [facility] [facility]ERY CONSULTATION REPORT  Referring Provider is Park Liter, MD Primary Cardiologist is No primary care provider on file. 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  Mean gradient at that time was 25 mmHg with a peak gradient of 40 mmHg.  Aortic valve area by VTI was 0.77 cm.  She reports a couple year history of slowly progressive exertional fatigue and shortness of breath which is gradually decreased her activity level.  She said that it is worsened over the past several months and she is exhausted at the end of the day.  She has had some intermittent episodes of mild dizziness.  She denies any chest pain or pressure.  She has had no peripheral edema.  She saw Dr. Agustin Cree and had a repeat echocardiogram on 04/23/2019.  This showed progression of her aortic stenosis with a mean gradient of 36 mmHg and peak gradient of 60 mmHg.  Aortic valve area was 0.73 cm with a dimensionless index of 0.23.  The aortic valve was moderately thickened and calcified with restricted leaflet mobility.  There is mild aortic insufficiency.  Left ventricular systolic function is normal with grade 2 diastolic dysfunction.  There is asymmetric septal hypertrophy.  She underwent cardiac catheterization on 05/20/2019 which showed widely patent coronary arteries with minimal nonobstructive coronary disease.  The mean gradient was measured at 26 mmHg with a valve area of 1.11 cm.  Right heart pressures were normal.  The patient is here today with her  daughter.  She is married and lives with her husband who is currently overseas working.  Past Medical History:  Diagnosis Date   Anxiety state 11/19/2012   ICD10   Aortic stenosis 12/13/2017   Moderate by echocardiogram from 2019 mean gradient 25   Atrial fibrillation ([place]) 06/28/2018   Dyspnea on exertion 11/01/2017   Elective surgery for purposes other than treating health conditions 09/27/2010   ICD10   Essential hypertension 06/30/2018   History of breast cancer 06/30/2018   Hyperlipidemia 06/30/2018   Hypokalemia 06/30/2018   Inflamed seborrheic keratosis 08/08/2012   Malignant neoplasm of upper-inner quadrant of female breast ([place]) 11/01/2017   Primary  diagnosis   Mitral regurgitation 12/13/2017   Mild to moderate based on echo from 2019   NICM (nonischemic cardiomyopathy) ([place]) 11/01/2015   Precordial pain 11/01/2015   Seborrheic keratoses 09/10/2016    Past Surgical History:  Procedure Laterality Date   BREAST LUMPECTOMY     CESAREAN SECTION     COSMETIC SURGERY     RIGHT/LEFT HEART CATH AND CORONARY ANGIOGRAPHY N/A 05/20/2019   Procedure: RIGHT/LEFT HEART CATH AND CORONARY ANGIOGRAPHY;  Surgeon: [name], MD;  Location: [facility];  Service: Cardiovascular;  Laterality: N/A;   TONSILLECTOMY      Family History  Problem Relation Age of Onset   Heart Problems Mother    Hypertension Mother    Heart Problems Father    Heart attack Sister     Social History   Socioeconomic History   Marital status: Married  Spouse name: Not on file   Number of children: Not on file   Years of education: Not on file   Highest education level: Not on file  Occupational History   Not on file  Tobacco Use   Smoking status: Never Smoker   Smokeless tobacco: Never Used  Substance and Sexual Activity   Alcohol use: Yes    Comment: occasional social drinking (once per month)    Drug use: Never   Sexual activity: Yes    Partners: Male     Birth control/protection: None  Other Topics Concern   Not on file  Social History Narrative   Not on file   Social Determinants of Health   Financial Resource Strain: Low Risk    Difficulty of Paying Living Expenses: Not hard at all  Food Insecurity:    Worried About Charity fundraiser in the Last Year:    Arboriculturist in the Last Year:   Transportation Needs:    Film/video editor (Medical):    Lack of Transportation (Non-Medical):   Physical Activity:    Days of Exercise per Week:    Minutes of Exercise per Session:   Stress:    Feeling of Stress :   Social Connections:    Frequency of Communication with Friends and Family:    Frequency of Social Gatherings with Friends and Family:    Attends Religious Services:    Active Member of Clubs or Organizations:    Attends Music therapist:    Marital Status:   Intimate Partner Violence:    Fear of Current or Ex-Partner:    Emotionally Abused:    Physically Abused:    Sexually Abused:     Current Outpatient Medications  Medication Sig Dispense Refill   acetaminophen (TYLENOL) 500 MG tablet Take 500-1,000 mg by mouth every 6 (six) hours as needed for headache (pain).     Acetylcysteine (NAC PO) Take 1 tablet by mouth daily.      albuterol (VENTOLIN HFA) 108 (90 Base) MCG/ACT inhaler Inhale 2 puffs into the lungs every 6 (six) hours as needed for wheezing or shortness of breath.     amphetamine-dextroamphetamine (ADDERALL) 20 MG tablet Take 10-20 mg by mouth daily as needed (ADD and/or hypersomnolence).      carvedilol (COREG) 25 MG tablet TAKE 1 TABLET BY MOUTH  TWICE DAILY WITH A MEAL (Patient taking differently: Take 25 mg by mouth in the morning and at bedtime. ) 180 tablet 1   diltiazem (CARDIZEM) 30 MG tablet Take 30 mg by mouth every 8 (eight) hours as needed for heart rate control.     ELIQUIS 5 MG TABS tablet TAKE 1 TABLET BY MOUTH  TWICE DAILY 180 tablet 1   furosemide  (LASIX) 20 MG tablet Take 1 tablet (20 mg total) by mouth daily as needed for fluid.     losartan (COZAAR) 25 MG tablet Take 25 mg by mouth in the morning and at bedtime.     ondansetron (ZOFRAN) 8 MG tablet Take 4-8 mg by mouth every 8 (eight) hours as needed (nausea while traveling).     oxymetazoline (AFRIN) 0.05 % nasal spray Place 1 spray into both nostrils daily as needed (allergies.).      potassium chloride SA (K-DUR,KLOR-CON) 20 MEQ tablet Take 20 mEq by mouth daily as needed (when takes lasix).   3   pramipexole (MIRAPEX) 1.5 MG tablet Take 0.375-1.5 tablets by mouth at bedtime as needed (restless legs).  1/4-1 tablet  0   VITAMIN A PO Take 1 capsule by mouth 3 (three) times a week.      Vitamin D, Ergocalciferol, (DRISDOL) 1.25 MG (50000 UNIT) CAPS capsule Take 50,000 Units by mouth every Sunday.     No current facility-administered medications for this visit.    Allergies  Allergen Reactions   Lisinopril Cough   Iodine Rash    05/21/19 The pt states that she had a reaction years ago when eating shellfish and she has also developed a rash on skin with iodine use. Theodosia Quay RN,BSN 05/21/19 12:36 PM        Metoprolol Rash      Review of Systems:   General:  normal appetite, + decreased energy, no weight gain, no weight loss, no fever  Cardiac:  no chest pain with exertion, no chest pain at rest, +SOB with exertion, no resting SOB, no PND, no orthopnea, + palpitations, + arrhythmia, + atrial fibrillation, no LE edema, + mild intermittent dizzy spells, no syncope  Respiratory:  + exertional shortness of breath, no home oxygen, no productive cough, no dry cough, no bronchitis, no wheezing, no hemoptysis, no asthma, no pain with inspiration or cough, no sleep apnea, no CPAP at night  GI:   no difficulty swallowing, no reflux, no frequent heartburn, no hiatal hernia, no abdominal pain, no constipation, no diarrhea, no hematochezia, no hematemesis, no melena  GU:   no  dysuria,  no frequency, no urinary tract infection, no hematuria,   no kidney stones, no kidney disease  Vascular:  no pain suggestive of claudication, no pain in feet, no leg cramps, no varicose veins, no DVT, no non-healing foot ulcer  Neuro:   no stroke, no TIA's, no seizures, no headaches, no temporary blindness one eye,  no slurred speech, no peripheral neuropathy, no chronic pain, no instability of gait, no memory/cognitive dysfunction  Musculoskeletal: no arthritis, no joint swelling, no myalgias, no difficulty walking, normal mobility   Skin:   no rash, no itching, no skin infections, no pressure sores or ulcerations  Psych:   no anxiety, no depression, no nervousness, no unusual recent stress  Eyes:   no blurry vision, no floaters, no recent vision changes, + wears glasses or contacts  ENT:   no hearing loss, no loose or painful teeth, no dentures, last saw dentist past year  Hematologic:  no easy bruising, no abnormal bleeding, no clotting disorder, no frequent epistaxis  Endocrine:  no diabetes, does not check CBG's at hom      Physical Exam:   BP 126/81 (BP Location: Right Arm, Patient Position: Sitting, Cuff Size: Normal)    Pulse 68    Temp 97.7 F (36.5 C) (Temporal)    Resp 20    Ht 5' 1.5" (1.562 m)    Wt 142 lb (64.4 kg)    LMP  (LMP Unknown)    SpO2 98% Comment: RA   BMI 26.40 kg/m   General:             well-appearing  HEENT:  Unremarkable, NCAT, PERLA, EOMI  Neck:   no JVD, no bruits, no adenopathy   Chest:   clear to auscultation, symmetrical breath sounds, no wheezes, no rhonchi   CV:   RRR, grade lll/VI crescendo/decrescendo murmur heard best at RSB,  no diastolic murmur  Abdomen:  soft, non-tender, no masses   Extremities:  warm, well-perfused, pulses palpable at ankle, no LE edema  Rectal/GU  Deferred  Neuro:  Grossly non-focal and symmetrical throughout  Skin:   Clean and dry, no rashes, no breakdown   Diagnostic Tests:  ECHOCARDIOGRAM REPORT        Patient Name:  Lindsay Guerrero Date of Exam: 04/23/2019  Medical Rec #: [id number]   Height:    62.0 in  Accession #:  [id number]  Weight:    140.2 lb  Date of Birth: 10-01-1955   BSA:     1.64 m  Patient Age:  36 years   BP:      122/70 mmHg  Patient Gender: F       HR:      85 bpm.  Exam Location: [place]   Procedure: 2D Echo   Indications:  Aortic Valve Disorder 424.1 / I35.9    History:    Patient has no prior history of Echocardiogram  examinations and         Patient has prior history of Echocardiogram examinations,  most         recent 11/05/2018. Cardiomyopathy, Mitral regurgitation,         Arrythmias:Atrial Fibrillation; Risk Factors:Hypertension.    Sonographer:  Luane School  Referring Phys: [place]    1. Left ventricular ejection fraction, by estimation, is 65 to 70%. The  left ventricle has normal function. The left ventrical has no regional  wall motion abnormalities. Left ventricular diastolic parameters are  consistent with Grade II diastolic  dysfunction (pseudonormalization).  2. Right ventricular systolic function is normal. The right ventricular  size is normal. There is normal pulmonary artery systolic pressure.  3. Left atrial size was mildly dilated. No left atrial/left atrial  appendage thrombus was detected.  4. The mitral valve is normal in structure and function. trivial mitral  valve regurgitation. No evidence of mitral stenosis.  5. Progression of AS noted. August 2020: peak/mean gradient39/24 mmHg,  AVA 0.66, DI 0.27. Now Peak/mean gradient - 60/36 mmHg, AVA 0.73, DI -  0.23. The aortic valve is grossly normal. Aortic valve regurgitation is  mild. Moderate to severe aortic valve  stenosis.  6. The inferior vena cava is normal in size with greater than 50%  respiratory variability, suggesting right atrial pressure of 3 mmHg.    FINDINGS  Left Ventricle: Left ventricular ejection fraction, by estimation, is 65  to 70%. The left ventricle has normal function. The left ventricle has no  regional wall motion abnormalities. The left ventricular internal cavity  size was normal in size. There is  borderline left ventricular hypertrophy. Left ventricular diastolic  parameters are consistent with Grade II diastolic dysfunction  (pseudonormalization).   Right Ventricle: The right ventricular size is normal. No increase in  right ventricular wall thickness. Right ventricular systolic function is  normal. There is normal pulmonary artery systolic pressure. The tricuspid  regurgitant velocity is 2.52 m/s, and  with an assumed right atrial pressure of 3 mmHg, the estimated right  ventricular systolic pressure is [id number] mmHg.   Left Atrium: Left atrial size was mildly dilated.   Right Atrium: Right atrial size was normal in size.   Pericardium: There is no evidence of pericardial effusion.   Mitral Valve: The mitral valve is normal in structure and function. Normal  mobility of the mitral valve leaflets. Trivial mitral valve regurgitation.  No evidence of mitral valve stenosis.   Tricuspid Valve: The tricuspid valve is normal in structure. Tricuspid  valve regurgitation is not demonstrated. No evidence of tricuspid  stenosis.   Aortic  Valve: Progression of AS noted. August 2020: peak/mean  gradient39/24 mmHg, AVA 0.66, DI 0.27. Now Peak/mean gradient - 60/36  mmHg, AVA 0.73, DI - 0.23. The aortic valve is grossly normal.. There is  moderate thickening and moderate calcification of  the aortic valve. Aortic valve regurgitation is mild. Aortic  regurgitation PHT measures 451 msec. Moderate to severe aortic stenosis is  present. There is moderate thickening of the aortic valve. There is  moderate calcification of the aortic valve.  Aortic valve mean gradient measures 35.5 mmHg. Aortic valve peak gradient   measures 60.1 mmHg. Aortic valve area, by VTI measures 0.73 cm.   Pulmonic Valve: The pulmonic valve was normal in structure. Pulmonic valve  regurgitation is not visualized. No evidence of pulmonic stenosis.   Aorta: The aortic root is normal in size and structure.   Venous: The inferior vena cava is normal in size with greater than 50%  respiratory variability, suggesting right atrial pressure of 3 mmHg. The  inferior vena cava and the hepatic vein show a normal flow pattern.   IAS/Shunts: No atrial level shunt detected by color flow Doppler.     LEFT VENTRICLE  PLAX 2D  LVIDd:     4.30 cm Diastology  LVIDs:     3.00 cm LV e' lateral:  6.42 cm/s  LV PW:     1.20 cm LV E/e' lateral: 16.8  LV IVS:    1.10 cm LV e' medial:  6.85 cm/s  LVOT diam:   2.00 cm LV E/e' medial: 15.8  LV SV:     69.43 ml  LV SV Index:  28.58  LVOT Area:   3.14 cm     RIGHT VENTRICLE       IVC  RV S prime:   10.60 cm/s IVC diam: 1.50 cm  TAPSE (M-mode): 2.5 cm               PULMONARY VEINS               Systolic Velocity: 123XX123 cm/s   LEFT ATRIUM      Index    RIGHT ATRIUM      Index  LA diam:   4.00 cm 2.43 cm/m RA Area:   17.90 cm  LA Vol (A4C): 67.3 ml 40.94 ml/m RA Volume:  54.00 ml 32.85 ml/m  AORTIC VALVE  AV Area (Vmax):  0.66 cm  AV Area (Vmean):  0.65 cm  AV Area (VTI):   0.73 cm  AV Vmax:      387.50 cm/s  AV Vmean:     284.500 cm/s  AV VTI:      0.947 m  AV Peak Grad:   60.1 mmHg  AV Mean Grad:   35.5 mmHg  LVOT Vmax:     82.00 cm/s  LVOT Vmean:    58.500 cm/s  LVOT VTI:     0.221 m  LVOT/AV VTI ratio: 0.23  AI PHT:      451 msec    AORTA  Ao Root diam: 2.80 cm  Ao Asc diam: 3.00 cm   MITRAL VALVE             TRICUSPID VALVE  MV Area (PHT): 4.89 cm       TR Peak grad:  25.4 mmHg  MV Decel Time: 155 msec        TR Vmax:    252.00 cm/s  MV E velocity: 108.00 cm/s 103 cm/s  MV A velocity: 108.00 cm/s 70.3 cm/s  SHUNTS  MV E/A ratio: 1.00    1.5    Systemic VTI: 0.22 m                    Systemic Diam: 2.00 cm   Jenne Campus MD  Electronically signed by Jenne Campus MD  Signature Date/Time: 04/23/2019/10:32:55 AM     Physicians Panel Physicians Referring Physician Case Authorizing Physician  [name], MD (Primary)       Procedures RIGHT/LEFT HEART CATH AND CORONARY ANGIOGRAPHY     Conclusion 1. Patent coronary arteries with minimal nonobstructive CAD, luminal irregularity (left dominant)  2. Moderate aortic stenosis with mean gradient 26 mmHg and AVA 1.11 square cm  3. Normal right heart pressures  Discussed findings with patient, reviewed echo and symptoms (both suggest symptomatic severe AS). Recommend continued evaluation with CTA studies and cardiac surgery referral. Consider TAVR versus conventional AVR in this low risk patient.            Indications Severe aortic stenosis [I35.0 (ICD-10-CM)]     Procedural Details Technical Details INDICATION: Severe, Stage D1 aortic stenosis  PROCEDURAL DETAILS: There was an indwelling IV in a right antecubital vein. Using normal sterile technique, the IV was changed out for a 5 Fr brachial sheath over a 0.018 inch wire. The right wrist was then prepped, draped, and anesthetized with 1% lidocaine. Using the modified Seldinger technique a 5/6 French Slender sheath was placed in the right radial artery. Intra-arterial verapamil was administered through the radial artery sheath. I was unable to pass wires through the radial sheath. Angiography is performed and a glide wire is used unsuccessfully. Attention is turned to the right groin. US-guided access is obtained after anesthetizing the right groin area. Images are captured and stored in the patient;'s chart. A 5 Fr sheath is inserted.  Standard catheters are used. A Swan-Ganz catheter was used for the right heart catheterization. Standard protocol was followed for recording of right heart pressures and sampling of oxygen saturations. Fick cardiac output was calculated. Standard Judkins catheters were used for selective coronary angiography. LV pressure is recorded and an aortic valve pullback is performed. There were no immediate procedural complications. A Mynx closure device is used but is unsuccessful. Manual pressure is then used for hemostasis of the right femoral artery. The patient was transferred to the post catheterization recovery area for further monitoring.    Estimated blood loss <50 mL.   During this procedure medications were administered to achieve and maintain moderate conscious sedation while the patient's heart rate, blood pressure, and oxygen saturation were continuously monitored and I was present face-to-face 100% of this time.     Medications (Filter: Administrations occurring from 05/20/19 0824 to 05/20/19 0949)  Continuous medications are totaled by the amount administered until 05/20/19 0949.  fentaNYL (SUBLIMAZE) injection (mcg) Total dose: 50 mcg  Date/Time   Rate/Dose/Volume Action  05/20/19 0842  25 mcg Given  0907  25 mcg Given  midazolam (VERSED) injection (mg) Total dose: 3 mg  Date/Time   Rate/Dose/Volume Action  05/20/19 0842  2 mg Given  0907  1 mg Given  lidocaine (PF) (XYLOCAINE) 1 % injection (mL) Total volume: 19 mL  Date/Time   Rate/Dose/Volume Action  05/20/19 0846  2 mL Given  0847  2 mL Given  0908  15 mL Given  Heparin (Porcine) in NaCl 1000-0.9 UT/500ML-% SOLN (mL) Total volume: 1,000 mL  Date/Time   Rate/Dose/Volume Action  05/20/19 0847  500 mL Given  [id number]  500 mL Given  Radial Cocktail/Verapamil only (mL) Total volume: 10 mL  Date/Time   Rate/Dose/Volume Action  05/20/19 0850  10 mL Given  iohexol (OMNIPAQUE) 350 MG/ML injection (mL) Total volume: 20 mL   Date/Time   Rate/Dose/Volume Action  05/20/19 0937  20 mL Given     Sedation Time Sedation Time Physician-1: 44 minutes 30 seconds        Contrast Medication Name Total Dose  iohexol (OMNIPAQUE) 350 MG/ML injection 20 mL     Radiation/Fluoro Fluoro time: 8.7 (min)  DAP: 4399 (mGycm2)  Cumulative Air Kerma: 70 (mGy)        Coronary Findings Diagnostic Dominance: Left  Left Anterior Descending  The vessel exhibits minimal luminal irregularities.   Left Circumflex  The vessel exhibits minimal luminal irregularities.   Right Coronary Artery  Vessel is angiographically normal.  Intervention No interventions have been documented.                         Left Heart Aortic Valve Mean gradient 26 mmHg, AVA 1.11 square cm     Coronary Diagrams Diagnostic Dominance: Left  &&&&&  Intervention      Implants  Vascular Products  Closure Mynx Control 60f [id number] - Not Implanted  Inventory item: CLOSURE [facility] CONTROL 36F Model/Cat number: [id number]  Manufacturer: [employer] Lot number: [id number]  Device identifier: [id number] Device identifier type: GS1  GUDID Information   Request status Successful     Brand name: MYNX CONTROL Version/Model: [id number]   Company name: [place] safety info as of 05/20/19: MR Safe   Contains dry or latex rubber: No     GMDN P.T. name: Wound hydrogel dressing, non-antimicrobial     As of 05/20/2019    Status: Received          Syngo Images Link to Procedure Log  Show images for CARDIAC CATHETERIZATION Procedure Log     Images on Long Term Storage   Show images for Janell, Wiedemann "Jan"         Hemo Data   Most Recent Value  Fick Cardiac Output 6.52 L/min  Fick Cardiac Output Index 4.01 (L/min)/BSA  Aortic Mean Gradient 25.83 mmHg  Aortic Peak Gradient 17 mmHg  Aortic Valve Area 1.11  Aortic Value Area Index 0.68 cm2/BSA  RA A Wave 3 mmHg  RA V Wave 1 mmHg  RA Mean 0 mmHg  RV  Systolic Pressure 29 mmHg  RV Diastolic Pressure 1 mmHg  RV EDP 5 mmHg  PA Systolic Pressure 21 mmHg  PA Diastolic Pressure 7 mmHg  PA Mean 14 mmHg  PW A Wave 12 mmHg  PW V Wave 11 mmHg  PW Mean 7 mmHg  AO Systolic Pressure 123456 mmHg  AO Diastolic Pressure 61 mmHg  AO Mean 84 mmHg  LV Systolic Pressure Q000111Q mmHg  LV Diastolic Pressure 4 mmHg  LV EDP 9 mmHg  AOp Systolic Pressure [id number] mmHg  AOp Diastolic Pressure 61 mmHg  AOp Mean Pressure 87 mmHg  LVp Systolic Pressure 123456 mmHg  LVp Diastolic Pressure 0 mmHg  LVp EDP Pressure 9 mmHg  QP/QS 1  TPVR Index 3.49 HRUI  TSVR Index 20.95 HRUI  TPVR/TSVR Ratio 0.17    ADDENDUM REPORT: 05/26/2019 10:18  CLINICAL DATA:  Severe Aortic Stenosis.  EXAM: Cardiac TAVR CT  TECHNIQUE: The patient was scanned on a [employer]. A 120 kV retrospective scan was triggered in  the descending thoracic aorta at 111 HU's. Gantry rotation speed was 250 msecs and collimation was .6 mm. No beta blockade or nitro were given. The 3D data set was reconstructed in 5% intervals of the R-R cycle. Due to motion artifact, systolic phases were examined using millisecond reconstruction at 10 msec intervals. Systolic and diastolic phases were analyzed on a dedicated work station using MPR, MIP and VRT modes. The patient received 80 cc of contrast.  FINDINGS: Image quality: Excellent.  Noise artifact is: Moderate motion artifact was present due to a likely PVC. Millisecond reconstruction was used at 200 ms.  Valve Morphology: The aortic valve is tricuspid and moderately calcified and severely thickened with restricted leaflet motion in systole.  Aortic Valve Calcium score: 488  Aortic annular dimension:  Phase assessed: 200 ms  Annular area: 367 mm2  Annular perimeter: 68.9 mm  Max diameter: 23.7 mm  Min diameter: 19.6 mm  Annular and subannular calcification: No significant annular/sub-annular  calcification.  Optimal coplanar projection: LAO 4 CAU 11  Coronary Artery Height above Annulus:  Left Main: 15.6 mm  Right Coronary: 13.4 mm (non-dominant)  Sinus of Valsalva Measurements:  Non-coronary: 27 mm  Right-coronary: 27 mm  Left-coronary: 27 mm  Sinus of Valsalva Height:  Non-coronary: 19 mm  Right-coronary: 18.5  Mm  Left-coronary: 19.4 mm  Sinotubular Junction: 23 mm.  Mild calcified plaque  Ascending Thoracic Aorta: 27 mm.  No significant calcifications.  Coronary Arteries: CAC score of 0. Normal coronary origin. Left dominance. The study was performed without use of NTG and is insufficient for plaque evaluation. Refer to recent cardiac cath for coronary evaluation.  Cardiac Morphology:  Right Atrium: Right atrial size is within normal limits.  Right Ventricle: The right ventricular cavity is within normal limits.  Left Atrium: Left atrial size is normal in size with no left atrial appendage filling defect.  Left Ventricle: The ventricular cavity size is within normal limits. There are no stigmata of prior infarction. There is no abnormal filling defect. There is severe asymmetric basal septal hypertrophy (17.5 mm).  Pulmonary arteries: Normal in size without proximal filling defect.  Pulmonary veins: Normal pulmonary venous drainage.  Pericardium: Normal thickness with no significant effusion or calcium present.  Mitral Valve: The mitral valve is normal structure without significant calcification.  Extra-cardiac findings: See attached radiology report for non-cardiac structures.  IMPRESSION: 1. Annular measurements appropriate for 23 mm Edwards Sapien 3. 200 ms reconstruction used due cardiac motion artifact in original dataset.  2. No significant annular or sub-annular calcifications.  3. Sufficient coronary to annulus distance.  4. Optimal Fluoroscopic Angle for Delivery: LAO 4 CAU 11  5. No  thrombus in the left atrial appendage.  6. Severe asymmetric basal septal hypertrophy (17.5 mm).  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  I have personally reviewed her 2D echocardiogram, cardiac catheterization, and CTA studies.  Her echocardiogram shows a trileaflet aortic valve with moderate calcification and thickening as well as restricted leaflet mobility.  The mean gradient is 36 mmHg with a valve area of 0.73 cm and a dimensionless index of 0.23 consistent with severe aortic stenosis.  Left  ventricular systolic function is normal with grade 2 diastolic dysfunction.  Her cardiac catheterization shows essentially normal coronary arteries.  I agree that aortic valve replacement is indicated in this patient for relief of her symptoms and to prevent progressive left ventricular deterioration.  I discussed the options of open surgical aortic valve replacement and transcatheter aortic valve replacement.  We discussed the pros and cons of both.  I told her that I thought open surgical aortic valve replacement was the best option at her age and would likely give her the best long-term result.  Her gated cardiac CTA shows anatomy suitable for transcatheter aortic valve replacement but I discussed the concerns about unknown long-term durability and risk of perivalvular leak in her age group.  I reviewed the cardiac catheterization and echocardiogram images with her and her daughter and answered all their questions.  She said that she would like to have the best long-term procedure performed.  I discussed the pros and cons of mechanical and  bioprosthetic valves.  At 64 years old I think a bioprosthetic valve would be the best option for her with very low risk of structural valve deterioration over the next 20 years.  She also has a history of paroxysmal atrial fibrillation over the past year or so but has had infrequent episodes with the last one being in October 2020.  She has been well controlled on Coreg and has prn Cardizem for rate control if needed.  She has tolerated Eliquis well.  I would plan to ligate her left atrial appendage with a clip but I do not think a biatrial maze procedure is indicated in this patient.  I discussed the operative procedure with the patient and her daughter including alternatives, benefits and risks; including but not limited to bleeding, blood transfusion, infection, stroke, myocardial infarction, graft failure, heart block requiring a permanent pacemaker, organ dysfunction, and death.  Lindsay Guerrero understands and agrees to proceed.    Plan:  She is going to discuss the timing of surgery with her husband and will call us back to schedule aortic valve replacement using a bioprosthetic valve and ligation of left atrial appendage.  I spent 60 minutes performing this consultation and > 50% of this time was spent face to face counseling and coordinating the care of this patient's severe symptomatic aortic stenosis.     Gaye Pollack, MD 05/27/2019 11:35 AM

## 2019-05-28 ENCOUNTER — Encounter: Payer: Self-pay | Admitting: *Deleted

## 2019-05-28 ENCOUNTER — Other Ambulatory Visit: Payer: Self-pay | Admitting: *Deleted

## 2019-05-28 DIAGNOSIS — I350 Nonrheumatic aortic (valve) stenosis: Secondary | ICD-10-CM

## 2019-06-02 ENCOUNTER — Ambulatory Visit: Payer: Commercial Managed Care - PPO | Admitting: Cardiology

## 2019-06-12 ENCOUNTER — Other Ambulatory Visit: Payer: Self-pay | Admitting: Cardiology

## 2019-06-16 NOTE — Telephone Encounter (Signed)
eliquis has been filled

## 2019-06-30 ENCOUNTER — Encounter (HOSPITAL_COMMUNITY)
Admission: RE | Admit: 2019-06-30 | Discharge: 2019-06-30 | Disposition: A | Discharge status: Home / Self Care | Payer: Commercial Managed Care - PPO | Source: Ambulatory Visit | Attending: Surgery | Admitting: Surgery

## 2019-06-30 ENCOUNTER — Ambulatory Visit (HOSPITAL_BASED_OUTPATIENT_CLINIC_OR_DEPARTMENT_OTHER)
Admission: RE | Admit: 2019-06-30 | Discharge: 2019-06-30 | Disposition: A | Discharge status: Home / Self Care | Payer: Commercial Managed Care - PPO | Source: Ambulatory Visit | Attending: Surgery | Admitting: Surgery

## 2019-06-30 ENCOUNTER — Encounter (HOSPITAL_COMMUNITY): Payer: Self-pay

## 2019-06-30 ENCOUNTER — Ambulatory Visit (HOSPITAL_COMMUNITY)
Admission: RE | Admit: 2019-06-30 | Discharge: 2019-06-30 | Disposition: A | Discharge status: Home / Self Care | Payer: Commercial Managed Care - PPO | Source: Ambulatory Visit | Attending: Surgery | Admitting: Surgery

## 2019-06-30 ENCOUNTER — Other Ambulatory Visit (HOSPITAL_COMMUNITY)
Admission: RE | Admit: 2019-06-30 | Discharge: 2019-06-30 | Disposition: A | Discharge status: Home / Self Care | Payer: Commercial Managed Care - PPO | Source: Ambulatory Visit | Attending: Surgery | Admitting: Surgery

## 2019-06-30 ENCOUNTER — Other Ambulatory Visit: Payer: Self-pay

## 2019-06-30 DIAGNOSIS — Z01818 Encounter for other preprocedural examination: Secondary | ICD-10-CM | POA: Insufficient documentation

## 2019-06-30 DIAGNOSIS — Z20822 Contact with and (suspected) exposure to covid-19: Secondary | ICD-10-CM | POA: Insufficient documentation

## 2019-06-30 DIAGNOSIS — I350 Nonrheumatic aortic (valve) stenosis: Secondary | ICD-10-CM

## 2019-06-30 HISTORY — DX: Inflammatory liver disease, unspecified: K75.9

## 2019-06-30 HISTORY — DX: Personal history of other diseases of the digestive system: Z87.19

## 2019-06-30 HISTORY — DX: Depression, unspecified: F32.A

## 2019-06-30 HISTORY — DX: Sleep apnea, unspecified: G47.30

## 2019-06-30 HISTORY — DX: Heart failure, unspecified: I50.9

## 2019-06-30 HISTORY — DX: Unspecified asthma, uncomplicated: J45.909

## 2019-06-30 HISTORY — DX: Dyspnea, unspecified: R06.00

## 2019-06-30 HISTORY — DX: Type 2 diabetes mellitus without complications: E11.9

## 2019-06-30 LAB — URINALYSIS, ROUTINE W REFLEX MICROSCOPIC
Bilirubin Urine: NEGATIVE
Glucose, UA: NEGATIVE mg/dL
Hgb urine dipstick: NEGATIVE
Ketones, ur: NEGATIVE mg/dL
Nitrite: NEGATIVE
Protein, ur: NEGATIVE mg/dL
Specific Gravity, Urine: 1.014 (ref 1.005–1.030)
pH: 5 (ref 5.0–8.0)

## 2019-06-30 LAB — HEMOGLOBIN A1C
Hgb A1c MFr Bld: 7.1 % — ABNORMAL HIGH (ref 4.8–5.6)
Mean Plasma Glucose: 157.07 mg/dL

## 2019-06-30 LAB — APTT: aPTT: 27 seconds (ref 24–36)

## 2019-06-30 LAB — COMPREHENSIVE METABOLIC PANEL
ALT: 19 U/L (ref 0–44)
AST: 15 U/L (ref 15–41)
Albumin: 4.1 g/dL (ref 3.5–5.0)
Alkaline Phosphatase: 88 U/L (ref 38–126)
Anion gap: 10 (ref 5–15)
BUN: 15 mg/dL (ref 8–23)
CO2: 29 mmol/L (ref 22–32)
Calcium: 9.6 mg/dL (ref 8.9–10.3)
Chloride: 97 mmol/L — ABNORMAL LOW (ref 98–111)
Creatinine, Ser: 0.8 mg/dL (ref 0.44–1.00)
GFR calc Af Amer: >60 mL/min (ref >60)
GFR calc non Af Amer: >60 mL/min (ref >60)
Glucose, Bld: 142 mg/dL — ABNORMAL HIGH (ref 70–99)
Potassium: 4.2 mmol/L (ref 3.5–5.1)
Sodium: 136 mmol/L (ref 135–145)
Total Bilirubin: 0.3 mg/dL (ref 0.3–1.2)
Total Protein: 7.8 g/dL (ref 6.5–8.1)

## 2019-06-30 LAB — SURGICAL PCR SCREEN
MRSA, PCR: NEGATIVE
Staphylococcus aureus: NEGATIVE

## 2019-06-30 LAB — CBC
HCT: 39.2 % (ref 36.0–46.0)
Hemoglobin: 12.2 g/dL (ref 12.0–15.0)
MCH: 27.9 pg (ref 26.0–34.0)
MCHC: 31.1 g/dL (ref 30.0–36.0)
MCV: 89.7 fL (ref 80.0–100.0)
Platelets: 376 10*3/uL (ref 150–400)
RBC: 4.37 MIL/uL (ref 3.87–5.11)
RDW: 14.4 % (ref 11.5–15.5)
WBC: 6 10*3/uL (ref 4.0–10.5)
nRBC: 0 % (ref 0.0–0.2)

## 2019-06-30 LAB — GLUCOSE, CAPILLARY: Glucose-Capillary: 139 mg/dL — ABNORMAL HIGH (ref 70–99)

## 2019-06-30 LAB — SARS CORONAVIRUS 2 (TAT 6-24 HRS): SARS Coronavirus 2: NEGATIVE

## 2019-06-30 LAB — PROTIME-INR
INR: 1 (ref 0.8–1.2)
Prothrombin Time: 13 seconds (ref 11.4–15.2)

## 2019-06-30 NOTE — Pre-Procedure Instructions (Addendum)
THEARY BETTERTON  06/30/2019      [place], [place] [address] Phone: [phone] Fax: [phone]  [place], [place] [facility] [address] [place] [address] [place] [address] Phone: [phone] Fax: [phone]    Your procedure is scheduled on April 22  Report to [facility] cone Entrance A at 5:30 A.M.  Call this number if you have problems the morning of surgery:  [phone]   Remember:  Do not eat or drink after midnight.      Take these medicines the morning of surgery with A SIP OF WATER :              Tylenol if needed               Albuterol inhaler if needed-bring to hospital               acetylcysteine               Bupropion (wellbutrin)               Carvedilol (coreg)               Diltiazem (cardezem) if needed               Lorazepam (ativan) if needed               Afrin nasal spray if needed       7 days prior to surgery STOP taking any Aspirin (unless otherwise instructed by your surgeon), Aleve, Naproxen, Ibuprofen, Motrin, Advil, Goody's, BC's, all herbal medications, fish oil, and all vitamins.                                        How to Manage Your Diabetes Before and After Surgery  Why is it important to control my blood sugar before and after surgery? . Improving blood sugar levels before and after surgery helps healing and can limit problems. . A way of improving blood sugar control is eating a healthy diet by: o  Eating less sugar and carbohydrates o  Increasing activity/exercise o  Talking with your doctor about reaching your blood sugar goals . High blood sugars (greater than 180 mg/dL) can raise your risk of infections and slow your recovery, so you will need to focus on controlling your diabetes during the weeks before surgery. . Make sure that the doctor who takes care of your diabetes knows about your planned surgery including the date and  location.  How do I manage my blood sugar before surgery? . Check your blood sugar at least 4 times a day, starting 2 days before surgery, to make sure that the level is not too high or low. o Check your blood sugar the morning of your surgery when you wake up and every 2 hours until you get to the Short Stay unit. . If your blood sugar is less than 70 mg/dL, you will need to treat for low blood sugar: o Do not take insulin. o Treat a low blood sugar (less than 70 mg/dL) with  cup of clear juice (cranberry or apple), 4 glucose tablets, OR glucose gel. Recheck blood sugar in 15 minutes after treatment (to make sure it is greater than 70 mg/dL). If your blood sugar is not  greater than 70 mg/dL on recheck, call [phone] o  for further instructions. . Report your blood sugar to the short stay nurse when you get to Short Stay.  . If you are admitted to the hospital after surgery: o Your blood sugar will be checked by the staff and you will probably be given insulin after surgery (instead of oral diabetes medicines) to make sure you have good blood sugar levels. o The goal for blood sugar control after surgery is 80-180 mg/dL.       Do not wear jewelry, make-up or nail polish.  Do not wear lotions, powders, or perfumes, or deodorant.  Do not shave 48 hours prior to surgery.  Men may shave face and neck.  Do not bring valuables to the hospital.  [facility] is not responsible for any belongings or valuables.  Contacts, dentures or bridgework may not be worn into surgery.  Leave your suitcase in the car.  After surgery it may be brought to your room.  For patients admitted to the hospital, discharge time will be determined by your treatment team.  Patients discharged the day of surgery will not be allowed to drive home.    Special instructions:   [place]- Preparing For Surgery  Before surgery, you can play an important role. Because skin is not sterile, your skin needs to be as  free of germs as possible. You can reduce the number of germs on your skin by washing with CHG (chlorahexidine gluconate) Soap before surgery.  CHG is an antiseptic cleaner which kills germs and bonds with the skin to continue killing germs even after washing.    Oral Hygiene is also important to reduce your risk of infection.  Remember - BRUSH YOUR TEETH THE MORNING OF SURGERY WITH YOUR REGULAR TOOTHPASTE  Please do not use if you have an allergy to CHG or antibacterial soaps. If your skin becomes reddened/irritated stop using the CHG.  Do not shave (including legs and underarms) for at least 48 hours prior to first CHG shower. It is OK to shave your face.  Please follow these instructions carefully.   1. Shower the NIGHT BEFORE SURGERY and the MORNING OF SURGERY with CHG.   2. If you chose to wash your hair, wash your hair first as usual with your normal shampoo.  3. After you shampoo, rinse your hair and body thoroughly to remove the shampoo.  4. Use CHG as you would any other liquid soap. You can apply CHG directly to the skin and wash gently with a scrungie or a clean washcloth.   5. Apply the CHG Soap to your body ONLY FROM THE NECK DOWN.  Do not use on open wounds or open sores. Avoid contact with your eyes, ears, mouth and genitals (private parts). Wash Face and genitals (private parts)  with your normal soap.  6. Wash thoroughly, paying special attention to the area where your surgery will be performed.  7. Thoroughly rinse your body with warm water from the neck down.  8. DO NOT shower/wash with your normal soap after using and rinsing off the CHG Soap.  9. Pat yourself dry with a CLEAN TOWEL.  10. Wear CLEAN PAJAMAS to bed the night before surgery, wear comfortable clothes the morning of surgery  11. Place CLEAN SHEETS on your bed the night of your first shower and DO NOT SLEEP WITH PETS.    Day of Surgery:  Do not apply any deodorants/lotions.  Please wear clean  clothes  to the hospital/surgery center.   Remember to brush your teeth WITH YOUR REGULAR TOOTHPASTE.    Please read over the following fact sheets that you were given. Coughing and Deep Breathing, MRSA Information and Surgical Site Infection Prevention

## 2019-06-30 NOTE — Progress Notes (Signed)
PCP - imran Hague @ [facility] in [place]    Chest x-ray - 06/30/19 EKG - 06/30/19 Stress Test - yrs.ago ECHO - 04/23/19 Cardiac Cath -  05/20/19 Sleep Study - borderline study, yrs. ago CPAP - no  Fasting Blood Sugar - 113 Checks Blood Sugar once a month  Blood Thinner Instructions: eliquis last dose 06/26/19 Aspirin Instructions:    COVID TEST- 06/30/19   Anesthesia review:   Patient denies shortness of breath, fever, cough and chest pain at PAT appointment   All instructions explained to the patient, with a verbal understanding of the material. Patient agrees to go over the instructions while at home for a better understanding. Patient also instructed to self quarantine after being tested for COVID-19. The opportunity to ask questions was provided.

## 2019-07-01 ENCOUNTER — Encounter (HOSPITAL_COMMUNITY): Payer: Self-pay | Admitting: Surgery

## 2019-07-01 MED ORDER — TRANEXAMIC ACID (OHS) BOLUS VIA INFUSION
15.0000 mg/kg | INTRAVENOUS | Status: AC
  Administered 2019-07-02: 957 mg via INTRAVENOUS
  Filled 2019-07-01: qty 957

## 2019-07-01 MED ORDER — DEXMEDETOMIDINE HCL IN NACL 400 MCG/100ML IV SOLN
0.1000 ug/kg/h | INTRAVENOUS | Status: AC
  Administered 2019-07-02: .7 ug/kg/h via INTRAVENOUS
  Filled 2019-07-01: qty 100

## 2019-07-01 MED ORDER — PHENYLEPHRINE HCL-NACL 20-0.9 MG/250ML-% IV SOLN
30.0000 ug/min | INTRAVENOUS | Status: AC
  Administered 2019-07-02: 25 ug/min via INTRAVENOUS
  Filled 2019-07-01: qty 250

## 2019-07-01 MED ORDER — SODIUM CHLORIDE 0.9 % IV SOLN
INTRAVENOUS | Status: DC
  Filled 2019-07-01: qty 30

## 2019-07-01 MED ORDER — NOREPINEPHRINE 4 MG/250ML-% IV SOLN
0.0000 ug/min | INTRAVENOUS | Status: DC
  Filled 2019-07-01: qty 250

## 2019-07-01 MED ORDER — MAGNESIUM SULFATE 50 % IJ SOLN
40.0000 meq | INTRAMUSCULAR | Status: DC
  Filled 2019-07-01: qty 9.85

## 2019-07-01 MED ORDER — VANCOMYCIN HCL 1250 MG/250ML IV SOLN
1250.0000 mg | INTRAVENOUS | Status: AC
  Administered 2019-07-02: 1250 mg via INTRAVENOUS
  Filled 2019-07-01: qty 250

## 2019-07-01 MED ORDER — PLASMA-LYTE 148 IV SOLN
INTRAVENOUS | Status: DC
  Filled 2019-07-01: qty 2.5

## 2019-07-01 MED ORDER — INSULIN REGULAR(HUMAN) IN NACL 100-0.9 UT/100ML-% IV SOLN
INTRAVENOUS | Status: AC
  Administered 2019-07-02: 1 [IU]/h via INTRAVENOUS
  Filled 2019-07-01: qty 100

## 2019-07-01 MED ORDER — SODIUM CHLORIDE 0.9 % IV SOLN
750.0000 mg | INTRAVENOUS | Status: AC
  Administered 2019-07-02: 750 mg via INTRAVENOUS
  Filled 2019-07-01: qty 750

## 2019-07-01 MED ORDER — TRANEXAMIC ACID 1000 MG/10ML IV SOLN
1.5000 mg/kg/h | INTRAVENOUS | Status: AC
  Administered 2019-07-02: 1.5 mg/kg/h via INTRAVENOUS
  Filled 2019-07-01: qty 25

## 2019-07-01 MED ORDER — SODIUM CHLORIDE 0.9 % IV SOLN
1.5000 g | INTRAVENOUS | Status: AC
  Administered 2019-07-02: 1.5 g via INTRAVENOUS
  Filled 2019-07-01: qty 1.5

## 2019-07-01 MED ORDER — EPINEPHRINE HCL 5 MG/250ML IV SOLN IN NS
0.0000 ug/min | INTRAVENOUS | Status: DC
  Filled 2019-07-01: qty 250

## 2019-07-01 MED ORDER — TRANEXAMIC ACID (OHS) PUMP PRIME SOLUTION
2.0000 mg/kg | INTRAVENOUS | Status: DC
  Filled 2019-07-01: qty 1.28

## 2019-07-01 MED ORDER — POTASSIUM CHLORIDE 2 MEQ/ML IV SOLN
80.0000 meq | INTRAVENOUS | Status: DC
  Filled 2019-07-01: qty 40

## 2019-07-01 MED ORDER — MILRINONE LACTATE IN DEXTROSE 20-5 MG/100ML-% IV SOLN
0.3000 ug/kg/min | INTRAVENOUS | Status: DC
  Filled 2019-07-01: qty 100

## 2019-07-01 MED ORDER — NITROGLYCERIN IN D5W 200-5 MCG/ML-% IV SOLN
2.0000 ug/min | INTRAVENOUS | Status: DC
  Filled 2019-07-01: qty 250

## 2019-07-01 NOTE — H&P (Signed)
[address]       [name] [address]             [phone]      Cardiothoracic Surgery Admission History and Physical   Referring Provider is Park Liter, MD  Primary Cardiologist is No primary care provider on file.  PCP is Hague, Lindsay Charters, MD      Chief Complaint  Patient presents with  . 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 Mean gradient at that time was 25 mmHg with a peak gradient of 40 mmHg. Aortic valve area by VTI was 0.77 cm. She reports a couple year history of slowly progressive exertional fatigue and shortness of breath which is gradually decreased her activity level. She said that it is worsened over the past several months and she is exhausted at the end of the day. She has had some intermittent episodes of mild dizziness. She denies any chest pain or pressure. She has had no peripheral edema. She saw Dr. Agustin Cree and had a repeat echocardiogram on 04/23/2019. This showed progression of her aortic stenosis with a mean gradient of 36 mmHg and peak gradient of 60 mmHg. Aortic valve area was 0.73 cm with a dimensionless index of 0.23. The aortic valve was moderately thickened and calcified with restricted leaflet mobility. There is mild aortic insufficiency. Left ventricular systolic function is normal with grade 2 diastolic dysfunction. There is asymmetric septal hypertrophy. She underwent cardiac catheterization on 05/20/2019 which showed widely patent coronary arteries with minimal nonobstructive coronary disease. The mean gradient was measured at 26 mmHg with a valve area of 1.11 cm. Right heart pressures were normal.     She is married and lives with her husband who is currently overseas working.      Past Medical History:    Diagnosis Date  . Anxiety state 11/19/2012   ICD10  . Aortic stenosis 12/13/2017   Moderate by echocardiogram from 2019 mean gradient 25  . Atrial fibrillation ([place]) 06/28/2018  . Dyspnea on exertion 11/01/2017  . Elective surgery for purposes other than treating health conditions 09/27/2010   ICD10  . Essential hypertension 06/30/2018  . History of breast cancer 06/30/2018  . Hyperlipidemia 06/30/2018  . Hypokalemia 06/30/2018  . Inflamed seborrheic keratosis 08/08/2012  . Malignant neoplasm of upper-inner quadrant of female breast ([place]) 11/01/2017   Primary diagnosis  . Mitral regurgitation 12/13/2017   Mild to moderate based on echo from 2019  . NICM (nonischemic cardiomyopathy) ([place]) 11/01/2015  . Precordial pain 11/01/2015  . Seborrheic keratoses 09/10/2016        Past Surgical History:  Procedure Laterality Date  . BREAST LUMPECTOMY    . CESAREAN SECTION    . COSMETIC SURGERY    . RIGHT/LEFT HEART CATH AND CORONARY ANGIOGRAPHY N/A 05/20/2019   Procedure: RIGHT/LEFT HEART CATH AND CORONARY ANGIOGRAPHY; Surgeon: [name], MD; Location: [facility]; Service: Cardiovascular; Laterality: N/A;  . TONSILLECTOMY          Family History  Problem Relation Age of Onset  . Heart Problems Mother   . Hypertension Mother   . Heart Problems Father   . Heart attack Sister    Social History        Socioeconomic History  . Marital status:  Married    Spouse name: Not on file  . Number of children: Not on file  . Years of education: Not on file  . Highest education level: Not on file  Occupational History  . Not on file  Tobacco Use  . Smoking status: Never Smoker  . Smokeless tobacco: Never Used  Substance and Sexual Activity  . Alcohol use: Yes    Comment: occasional social drinking (once per month)   . Drug use: Never  . Sexual activity: Yes    Partners: Male    Birth control/protection: None  Other Topics Concern  . Not on file  Social History Narrative  . Not on  file   Social Determinants of Health      Financial Resource Strain: Low Risk   . Difficulty of Paying Living Expenses: Not hard at all  Food Insecurity:   . Worried About Charity fundraiser in the Last Year:   . Arboriculturist in the Last Year:   Transportation Needs:   . Film/video editor (Medical):   [name] Lack of Transportation (Non-Medical):   Physical Activity:   . Days of Exercise per Week:   . Minutes of Exercise per Session:   Stress:   . Feeling of Stress :   Social Connections:   . Frequency of Communication with Friends and Family:   . Frequency of Social Gatherings with Friends and Family:   . Attends Religious Services:   . Active Member of Clubs or Organizations:   . Attends Archivist Meetings:   [name] Marital Status:   Intimate Partner Violence:   . Fear of Current or Ex-Partner:   . Emotionally Abused:   [name] Physically Abused:   . Sexually Abused:          Current Outpatient Medications  Medication Sig Dispense Refill  . acetaminophen (TYLENOL) 500 MG tablet Take 500-1,000 mg by mouth every 6 (six) hours as needed for headache (pain).    . Acetylcysteine (NAC PO) Take 1 tablet by mouth daily.     [name] albuterol (VENTOLIN HFA) 108 (90 Base) MCG/ACT inhaler Inhale 2 puffs into the lungs every 6 (six) hours as needed for wheezing or shortness of breath.    . amphetamine-dextroamphetamine (ADDERALL) 20 MG tablet Take 10-20 mg by mouth daily as needed (ADD and/or hypersomnolence).     . carvedilol (COREG) 25 MG tablet TAKE 1 TABLET BY MOUTH TWICE DAILY WITH A MEAL (Patient taking differently: Take 25 mg by mouth in the morning and at bedtime. ) 180 tablet 1  . diltiazem (CARDIZEM) 30 MG tablet Take 30 mg by mouth every 8 (eight) hours as needed for heart rate control.    [name] ELIQUIS 5 MG TABS tablet TAKE 1 TABLET BY MOUTH TWICE DAILY 180 tablet 1  . furosemide (LASIX) 20 MG tablet Take 1 tablet (20 mg total) by mouth daily as needed for fluid.    [name] losartan  (COZAAR) 25 MG tablet Take 25 mg by mouth in the morning and at bedtime.    . ondansetron (ZOFRAN) 8 MG tablet Take 4-8 mg by mouth every 8 (eight) hours as needed (nausea while traveling).    [name] oxymetazoline (AFRIN) 0.05 % nasal spray Place 1 spray into both nostrils daily as needed (allergies.).     [name] potassium chloride SA (K-DUR,KLOR-CON) 20 MEQ tablet Take 20 mEq by mouth daily as needed (when takes lasix).   3  . pramipexole (MIRAPEX) 1.5 MG tablet Take  0.375-1.5 tablets by mouth at bedtime as needed (restless legs). 1/4-1 tablet  0  . VITAMIN A PO Take 1 capsule by mouth 3 (three) times a week.     . Vitamin D, Ergocalciferol, (DRISDOL) 1.25 MG (50000 UNIT) CAPS capsule Take 50,000 Units by mouth every Sunday.     No current facility-administered medications for this visit.        Allergies  Allergen Reactions  . Lisinopril Cough  . Iodine Rash    05/21/19 The pt states that she had a reaction years ago when eating shellfish and she has also developed a rash on skin with iodine use.  Theodosia Quay RN,BSN  05/21/19  12:36 PM   . Metoprolol Rash   Review of Systems:   General: normal appetite, + decreased energy, no weight gain, no weight loss, no fever  Cardiac: no chest pain with exertion, no chest pain at rest, +SOB with exertion, no resting SOB, no PND, no orthopnea, + palpitations, + arrhythmia, + atrial fibrillation, no LE edema, + mild intermittent dizzy spells, no syncope  Respiratory: + exertional shortness of breath, no home oxygen, no productive cough, no dry cough, no bronchitis, no wheezing, no hemoptysis, no asthma, no pain with inspiration or cough, no sleep apnea, no CPAP at night  GI: no difficulty swallowing, no reflux, no frequent heartburn, no hiatal hernia, no abdominal pain, no constipation, no diarrhea, no hematochezia, no hematemesis, no melena  GU: no dysuria, no frequency, no urinary tract infection, no hematuria, no kidney stones, no kidney disease  Vascular:  no pain suggestive of claudication, no pain in feet, no leg cramps, no varicose veins, no DVT, no non-healing foot ulcer  Neuro: no stroke, no TIA's, no seizures, no headaches, no temporary blindness one eye, no slurred speech, no peripheral neuropathy, no chronic pain, no instability of gait, no memory/cognitive dysfunction  Musculoskeletal: no arthritis, no joint swelling, no myalgias, no difficulty walking, normal mobility  Skin: no rash, no itching, no skin infections, no pressure sores or ulcerations  Psych: no anxiety, no depression, no nervousness, no unusual recent stress  Eyes: no blurry vision, no floaters, no recent vision changes, + wears glasses or contacts  ENT: no hearing loss, no loose or painful teeth, no dentures, last saw dentist past year  Hematologic: no easy bruising, no abnormal bleeding, no clotting disorder, no frequent epistaxis  Endocrine: no diabetes, does not check CBG's at home   Physical Exam:   BP 126/81 (BP Location: Right Arm, Patient Position: Sitting, Cuff Size: Normal)  Pulse 68  Temp 97.7 F (36.5 C) (Temporal)  Resp 20  Ht 5' 1.5" (1.562 m)  Wt 142 lb (64.4 kg)  LMP (LMP Unknown)  SpO2 98% Comment: RA  BMI 26.40 kg/m  General: well-appearing  HEENT: Unremarkable, NCAT, PERLA, EOMI  Neck: no JVD, no bruits, no adenopathy  Chest: clear to auscultation, symmetrical breath sounds, no wheezes, no rhonchi  CV: RRR, grade lll/VI crescendo/decrescendo murmur heard best at RSB, no diastolic murmur  Abdomen: soft, non-tender, no masses  Extremities: warm, well-perfused, pulses palpable at ankle, no LE edema  Rectal/GU Deferred  Neuro: Grossly non-focal and symmetrical throughout  Skin: Clean and dry, no rashes, no breakdown    Diagnostic Tests:   ECHOCARDIOGRAM REPORT     Patient Name: MICHAL FIRPO Date of Exam: 04/23/2019  Medical Rec #: [id number] Height: 62.0 in  Accession #: [id number] Weight: 140.2 lb  Date of Birth: Mar 30, 1955 BSA: 1.64 m   Patient Age:  63 years BP: 122/70 mmHg  Patient Gender: F HR: 85 bpm.  Exam Location: [facility]   Procedure: 2D Echo   Indications: Aortic Valve Disorder 424.1 / I35.9   History: Patient has no prior history of Echocardiogram  examinations and  Patient has prior history of Echocardiogram examinations,  most  recent 11/05/2018. Cardiomyopathy, Mitral regurgitation,  Arrythmias:Atrial Fibrillation; Risk Factors:Hypertension.   Sonographer: Luane School  Referring Phys: [place]    1. Left ventricular ejection fraction, by estimation, is 65 to 70%. The  left ventricle has normal function. The left ventrical has no regional  wall motion abnormalities. Left ventricular diastolic parameters are  consistent with Grade II diastolic  dysfunction (pseudonormalization).  2. Right ventricular systolic function is normal. The right ventricular  size is normal. There is normal pulmonary artery systolic pressure.  3. Left atrial size was mildly dilated. No left atrial/left atrial  appendage thrombus was detected.  4. The mitral valve is normal in structure and function. trivial mitral  valve regurgitation. No evidence of mitral stenosis.  5. Progression of AS noted. August 2020: peak/mean gradient39/24 mmHg,  AVA 0.66, DI 0.27. Now Peak/mean gradient - 60/36 mmHg, AVA 0.73, DI -  0.23. The aortic valve is grossly normal. Aortic valve regurgitation is  mild. Moderate to severe aortic valve  stenosis.  6. The inferior vena cava is normal in size with greater than 50%  respiratory variability, suggesting right atrial pressure of 3 mmHg.   FINDINGS  Left Ventricle: Left ventricular ejection fraction, by estimation, is 65  to 70%. The left ventricle has normal function. The left ventricle has no  regional wall motion abnormalities. The left ventricular internal cavity  size was normal in size. There is  borderline left ventricular hypertrophy. Left ventricular  diastolic  parameters are consistent with Grade II diastolic dysfunction  (pseudonormalization).   Right Ventricle: The right ventricular size is normal. No increase in  right ventricular wall thickness. Right ventricular systolic function is  normal. There is normal pulmonary artery systolic pressure. The tricuspid  regurgitant velocity is 2.52 m/s, and  with an assumed right atrial pressure of 3 mmHg, the estimated right  ventricular systolic pressure is [id number] mmHg.   Left Atrium: Left atrial size was mildly dilated.   Right Atrium: Right atrial size was normal in size.   Pericardium: There is no evidence of pericardial effusion.   Mitral Valve: The mitral valve is normal in structure and function. Normal  mobility of the mitral valve leaflets. Trivial mitral valve regurgitation.  No evidence of mitral valve stenosis.   Tricuspid Valve: The tricuspid valve is normal in structure. Tricuspid  valve regurgitation is not demonstrated. No evidence of tricuspid  stenosis.   Aortic Valve: Progression of AS noted. August 2020: peak/mean  gradient39/24 mmHg, AVA 0.66, DI 0.27. Now Peak/mean gradient - 60/36  mmHg, AVA 0.73, DI - 0.23. The aortic valve is grossly normal.. There is  moderate thickening and moderate calcification of  the aortic valve. Aortic valve regurgitation is mild. Aortic  regurgitation PHT measures 451 msec. Moderate to severe aortic stenosis is  present. There is moderate thickening of the aortic valve. There is  moderate calcification of the aortic valve.  Aortic valve mean gradient measures 35.5 mmHg. Aortic valve peak gradient  measures 60.1 mmHg. Aortic valve area, by VTI measures 0.73 cm.   Pulmonic Valve: The pulmonic valve was normal in structure. Pulmonic valve  regurgitation is not visualized. No evidence of  pulmonic stenosis.   Aorta: The aortic root is normal in size and structure.   Venous: The inferior vena cava is normal in size with greater than  50%  respiratory variability, suggesting right atrial pressure of 3 mmHg. The  inferior vena cava and the hepatic vein show a normal flow pattern.   IAS/Shunts: No atrial level shunt detected by color flow Doppler.    LEFT VENTRICLE  PLAX 2D  LVIDd: 4.30 cm Diastology  LVIDs: 3.00 cm LV e' lateral: 6.42 cm/s  LV PW: 1.20 cm LV E/e' lateral: 16.8  LV IVS: 1.10 cm LV e' medial: 6.85 cm/s  LVOT diam: 2.00 cm LV E/e' medial: 15.8  LV SV: 69.43 ml  LV SV Index: 28.58  LVOT Area: 3.14 cm    RIGHT VENTRICLE IVC  RV S prime: 10.60 cm/s IVC diam: 1.50 cm  TAPSE (M-mode): 2.5 cm  PULMONARY VEINS  Systolic Velocity: 123XX123 cm/s   LEFT ATRIUM Index RIGHT ATRIUM Index  LA diam: 4.00 cm 2.43 cm/m RA Area: 17.90 cm  LA Vol (A4C): 67.3 ml 40.94 ml/m RA Volume: 54.00 ml 32.85 ml/m  AORTIC VALVE  AV Area (Vmax): 0.66 cm  AV Area (Vmean): 0.65 cm  AV Area (VTI): 0.73 cm  AV Vmax: 387.50 cm/s  AV Vmean: 284.500 cm/s  AV VTI: 0.947 m  AV Peak Grad: 60.1 mmHg  AV Mean Grad: 35.5 mmHg  LVOT Vmax: 82.00 cm/s  LVOT Vmean: 58.500 cm/s  LVOT VTI: 0.221 m  LVOT/AV VTI ratio: 0.23  AI PHT: 451 msec   AORTA  Ao Root diam: 2.80 cm  Ao Asc diam: 3.00 cm   MITRAL VALVE TRICUSPID VALVE  MV Area (PHT): 4.89 cm TR Peak grad: 25.4 mmHg  MV Decel Time: 155 msec TR Vmax: 252.00 cm/s  MV E velocity: 108.00 cm/s 103 cm/s  MV A velocity: 108.00 cm/s 70.3 cm/s SHUNTS  MV E/A ratio: 1.00 1.5 Systemic VTI: 0.22 m  Systemic Diam: 2.00 cm   Jenne Campus MD  Electronically signed by Jenne Campus MD  Signature Date/Time: 04/23/2019/10:32:55 AM     Panel Physicians Referring Physician Case Authorizing Physician  [name], MD (Primary)       Procedures  RIGHT/LEFT HEART CATH AND CORONARY ANGIOGRAPHY     Conclusion  1. Patent coronary arteries with minimal nonobstructive CAD, luminal irregularity (left dominant)  2. Moderate aortic stenosis with mean gradient 26 mmHg and AVA  1.11 square cm  3. Normal right heart pressures  Discussed findings with patient, reviewed echo and symptoms (both suggest symptomatic severe AS). Recommend continued evaluation with CTA studies and cardiac surgery referral. Consider TAVR versus conventional AVR in this low risk patient.            Indications  Severe aortic stenosis [I35.0 (ICD-10-CM)]     Procedural Details  Technical Details INDICATION: Severe, Stage D1 aortic stenosis  PROCEDURAL DETAILS: There was an indwelling IV in a right antecubital vein. Using normal sterile technique, the IV was changed out for a 5 Fr brachial sheath over a 0.018 inch wire. The right wrist was then prepped, draped, and anesthetized with 1% lidocaine. Using the modified Seldinger technique a 5/6 French Slender sheath was placed in the right radial artery. Intra-arterial verapamil was administered through the radial artery sheath. I was unable to pass wires through the radial sheath. Angiography is performed and a glide wire is used unsuccessfully. Attention is turned to the right groin. US-guided access is obtained after anesthetizing the right  groin area. Images are captured and stored in the patient;'s chart. A 5 Fr sheath is inserted. Standard catheters are used. A Swan-Ganz catheter was used for the right heart catheterization. Standard protocol was followed for recording of right heart pressures and sampling of oxygen saturations. Fick cardiac output was calculated. Standard Judkins catheters were used for selective coronary angiography. LV pressure is recorded and an aortic valve pullback is performed. There were no immediate procedural complications. A Mynx closure device is used but is unsuccessful. Manual pressure is then used for hemostasis of the right femoral artery. The patient was transferred to the post catheterization recovery area for further monitoring.    Estimated blood loss <50 mL.   During this procedure medications were  administered to achieve and maintain moderate conscious sedation while the patient's heart rate, blood pressure, and oxygen saturation were continuously monitored and I was present face-to-face 100% of this time.     Medications  (Filter: Administrations occurring from 05/20/19 0824 to 05/20/19 0949)  Continuous medications are totaled by the amount administered until 05/20/19 0949.  fentaNYL (SUBLIMAZE) injection (mcg)  Total dose: 50 mcg  Date/Time   Rate/Dose/Volume Action  05/20/19 0842  25 mcg Given  0907  25 mcg Given  midazolam (VERSED) injection (mg)  Total dose: 3 mg  Date/Time   Rate/Dose/Volume Action  05/20/19 0842  2 mg Given  0907  1 mg Given  lidocaine (PF) (XYLOCAINE) 1 % injection (mL)  Total volume: 19 mL  Date/Time   Rate/Dose/Volume Action  05/20/19 0846  2 mL Given  0847  2 mL Given  0908  15 mL Given  Heparin (Porcine) in NaCl 1000-0.9 UT/500ML-% SOLN (mL)  Total volume: 1,000 mL  Date/Time   Rate/Dose/Volume Action  05/20/19 0847  500 mL Given  0847  500 mL Given  Radial Cocktail/Verapamil only (mL)  Total volume: 10 mL  Date/Time   Rate/Dose/Volume Action  05/20/19 0850  10 mL Given  iohexol (OMNIPAQUE) 350 MG/ML injection (mL)  Total volume: 20 mL  Date/Time   Rate/Dose/Volume Action  05/20/19 0937  20 mL Given     Sedation Time  Sedation Time Physician-1: 44 minutes 30 seconds        Contrast  Medication Name Total Dose  iohexol (OMNIPAQUE) 350 MG/ML injection 20 mL     Radiation/Fluoro  Fluoro time: 8.7 (min)  DAP: 4399 (mGycm2)  Cumulative Air Kerma: 70 (mGy)        Coronary Findings  Diagnostic  Dominance: Left  Left Anterior Descending  The vessel exhibits minimal luminal irregularities.   Left Circumflex  The vessel exhibits minimal luminal irregularities.   Right Coronary Artery  Vessel is angiographically normal.  Intervention  No interventions have been documented.                         Left  Heart  Aortic Valve Mean gradient 26 mmHg, AVA 1.11 square cm     Coronary Diagrams  Diagnostic  Dominance: Left  &&&&&  Intervention       Implants                  Vascular Products  Closure Mynx Control 41f [id number] - Not Implanted  Inventory item: CLOSURE [facility] CONTROL 52F Model/Cat number: [id number]  Manufacturer: [employer] Lot number: [id number]  Device identifier: [id number] Device identifier type: GS1  GUDID Information   Request status Successful     Brand  name: MYNX CONTROL Version/Model: [id number]   Company name: [employer] safety info as of 05/20/19: MR Safe   Contains dry or latex rubber: No     GMDN P.T. name: Wound hydrogel dressing, non-antimicrobial     As of 05/20/2019    Status: Received                        Syngo Images Link to Procedure Log  Show images for CARDIAC CATHETERIZATION Procedure Log     Images on Long Term Storage   Show images for Retina, [place] "Jan"            Hemo Data    Most Recent Value  Fick Cardiac Output 6.52 L/min  Fick Cardiac Output Index 4.01 (L/min)/BSA  Aortic Mean Gradient 25.83 mmHg  Aortic Peak Gradient 17 mmHg  Aortic Valve Area 1.11  Aortic Value Area Index 0.68 cm2/BSA  RA A Wave 3 mmHg  RA V Wave 1 mmHg  RA Mean 0 mmHg  RV Systolic Pressure 29 mmHg  RV Diastolic Pressure 1 mmHg  RV EDP 5 mmHg  PA Systolic Pressure 21 mmHg  PA Diastolic Pressure 7 mmHg  PA Mean 14 mmHg  PW A Wave 12 mmHg  PW V Wave 11 mmHg  PW Mean 7 mmHg  AO Systolic Pressure 123456 mmHg  AO Diastolic Pressure 61 mmHg  AO Mean 84 mmHg  LV Systolic Pressure [id number] mmHg  LV Diastolic Pressure 4 mmHg  LV EDP 9 mmHg  AOp Systolic Pressure [id number] mmHg  AOp Diastolic Pressure 61 mmHg  AOp Mean Pressure 87 mmHg  LVp Systolic Pressure 123456 mmHg  LVp Diastolic Pressure 0 mmHg  LVp EDP Pressure 9 mmHg  QP/QS 1  TPVR Index 3.49 HRUI  TSVR Index 20.95 HRUI  TPVR/TSVR Ratio 0.17    ADDENDUM REPORT: 05/26/2019 10:18   CLINICAL DATA: Severe Aortic Stenosis.  EXAM:  Cardiac TAVR CT  TECHNIQUE:  The patient was scanned on a [employer]. A 120 kV  retrospective scan was triggered in the descending thoracic aorta at  111 HU's. Gantry rotation speed was 250 msecs and collimation was .6  mm. No beta blockade or nitro were given. The 3D data set was  reconstructed in 5% intervals of the R-R cycle. Due to motion  artifact, systolic phases were examined using millisecond  reconstruction at 10 msec intervals. Systolic and diastolic phases  were analyzed on a dedicated work station using MPR, MIP and VRT  modes. The patient received 80 cc of contrast.  FINDINGS:  Image quality: Excellent.  Noise artifact is: Moderate motion artifact was present due to a  likely PVC. Millisecond reconstruction was used at 200 ms.  Valve Morphology: The aortic valve is tricuspid and moderately  calcified and severely thickened with restricted leaflet motion in  systole.  Aortic Valve Calcium score: 488  Aortic annular dimension:  Phase assessed: 200 ms  Annular area: 367 mm2  Annular perimeter: 68.9 mm  Max diameter: 23.7 mm  Min diameter: 19.6 mm  Annular and subannular calcification: No significant  annular/sub-annular calcification.  Optimal coplanar projection: LAO 4 CAU 11  Coronary Artery Height above Annulus:  Left Main: 15.6 mm  Right Coronary: 13.4 mm (non-dominant)  Sinus of Valsalva Measurements:  Non-coronary: 27 mm  Right-coronary: 27 mm  Left-coronary: 27 mm  Sinus of Valsalva Height:  Non-coronary: 19 mm  Right-coronary: 18.5 Mm  Left-coronary: 19.4 mm  Sinotubular Junction: 23  mm. Mild calcified plaque  Ascending Thoracic Aorta: 27 mm. No significant calcifications.  Coronary Arteries: CAC score of 0. Normal coronary origin. Left  dominance. The study was performed without use of NTG and is  insufficient for plaque evaluation. Refer to recent cardiac cath for  coronary evaluation.    Cardiac Morphology:  Right Atrium: Right atrial size is within normal limits.  Right Ventricle: The right ventricular cavity is within normal  limits.  Left Atrium: Left atrial size is normal in size with no left atrial  appendage filling defect.  Left Ventricle: The ventricular cavity size is within normal limits.  There are no stigmata of prior infarction. There is no abnormal  filling defect. There is severe asymmetric basal septal hypertrophy  (17.5 mm).  Pulmonary arteries: Normal in size without proximal filling defect.  Pulmonary veins: Normal pulmonary venous drainage.  Pericardium: Normal thickness with no significant effusion or  calcium present.  Mitral Valve: The mitral valve is normal structure without  significant calcification.  Extra-cardiac findings: See attached radiology report for  non-cardiac structures.  IMPRESSION:  1. Annular measurements appropriate for 23 mm Edwards Sapien 3. 200  ms reconstruction used due cardiac motion artifact in original  dataset.  2. No significant annular or sub-annular calcifications.  3. Sufficient coronary to annulus distance.  4. Optimal Fluoroscopic Angle for Delivery: LAO 4 CAU 11  5. No thrombus in the left atrial appendage.  6. Severe asymmetric basal septal hypertrophy (17.5 mm).  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 I have personally reviewed her 2D echocardiogram, cardiac catheterization, and CTA studies. Her echocardiogram shows a trileaflet aortic valve with moderate calcification and thickening as well as restricted leaflet mobility. The mean gradient is 36  mmHg with a valve area of 0.73 cm and a dimensionless index of 0.23 consistent with severe aortic stenosis. Left ventricular systolic function is normal with grade 2 diastolic dysfunction. Her cardiac catheterization shows essentially normal coronary arteries. I agree that aortic valve replacement is indicated in this patient for relief of her symptoms and to prevent progressive left ventricular deterioration. I discussed the options of open surgical aortic valve replacement and transcatheter aortic valve replacement. We discussed the pros and cons of both. I told her that I thought open surgical aortic valve replacement was the best option at her age and would likely give her the best long-term result. Her gated cardiac CTA shows anatomy suitable for transcatheter aortic valve replacement but I discussed the concerns about unknown long-term durability and risk of perivalvular leak in her age group. I reviewed the cardiac catheterization and echocardiogram images with her and her daughter and answered all their questions. She said that she would like to have the best long-term procedure performed. I discussed the pros and cons of mechanical and bioprosthetic valves. At 64 years old I think a bioprosthetic valve would be the best option for her with very low risk of structural valve deterioration over the next 20 years. She also has a history of paroxysmal atrial fibrillation over the past year or so but has had infrequent episodes with the last one being in October 2020. She has been  well controlled on Coreg and has prn Cardizem for rate control if needed. She has tolerated Eliquis well. I would plan to ligate her left atrial appendage with a clip but I do not think a biatrial maze procedure is indicated in this patient.  I discussed the operative procedure with the patient and her daughter including alternatives, benefits and risks; including but not limited to bleeding, blood transfusion, infection, stroke,  myocardial infarction, graft failure, heart block requiring a permanent pacemaker, organ dysfunction, and death. [name] understands and agrees to proceed.   Plan:   Aortic valve replacement using a bioprosthetic valve and ligation of left atrial appendage.    Gaye Pollack, MD

## 2019-07-02 ENCOUNTER — Other Ambulatory Visit: Payer: Self-pay

## 2019-07-02 ENCOUNTER — Encounter (HOSPITAL_COMMUNITY)
Admission: RE | Disposition: A | Discharge status: Home / Self Care | Payer: Self-pay | Source: Home / Self Care | Attending: Surgery

## 2019-07-02 ENCOUNTER — Inpatient Hospital Stay (HOSPITAL_COMMUNITY): Payer: Commercial Managed Care - PPO

## 2019-07-02 ENCOUNTER — Encounter (HOSPITAL_COMMUNITY): Payer: Self-pay | Admitting: Surgery

## 2019-07-02 ENCOUNTER — Inpatient Hospital Stay (HOSPITAL_COMMUNITY): Payer: Commercial Managed Care - PPO | Admitting: Critical Care Medicine

## 2019-07-02 ENCOUNTER — Inpatient Hospital Stay (HOSPITAL_COMMUNITY): Payer: Commercial Managed Care - PPO | Admitting: Physician Assistant

## 2019-07-02 ENCOUNTER — Inpatient Hospital Stay (HOSPITAL_COMMUNITY)
Admission: RE | Admit: 2019-07-02 | Discharge: 2019-07-12 | DRG: 220 | Disposition: A | Discharge status: Home / Self Care | Payer: Commercial Managed Care - PPO | Attending: Surgery | Admitting: Surgery

## 2019-07-02 DIAGNOSIS — I2510 Atherosclerotic heart disease of native coronary artery without angina pectoris: Secondary | ICD-10-CM | POA: Diagnosis present

## 2019-07-02 DIAGNOSIS — I350 Nonrheumatic aortic (valve) stenosis: Secondary | ICD-10-CM

## 2019-07-02 DIAGNOSIS — Z853 Personal history of malignant neoplasm of breast: Secondary | ICD-10-CM

## 2019-07-02 DIAGNOSIS — Z7901 Long term (current) use of anticoagulants: Secondary | ICD-10-CM | POA: Diagnosis not present

## 2019-07-02 DIAGNOSIS — I352 Nonrheumatic aortic (valve) stenosis with insufficiency: Principal | ICD-10-CM | POA: Diagnosis present

## 2019-07-02 DIAGNOSIS — I428 Other cardiomyopathies: Secondary | ICD-10-CM | POA: Diagnosis present

## 2019-07-02 DIAGNOSIS — J90 Pleural effusion, not elsewhere classified: Secondary | ICD-10-CM

## 2019-07-02 DIAGNOSIS — J918 Pleural effusion in other conditions classified elsewhere: Secondary | ICD-10-CM | POA: Diagnosis not present

## 2019-07-02 DIAGNOSIS — I4819 Other persistent atrial fibrillation: Secondary | ICD-10-CM | POA: Diagnosis not present

## 2019-07-02 DIAGNOSIS — Z953 Presence of xenogenic heart valve: Secondary | ICD-10-CM

## 2019-07-02 DIAGNOSIS — E785 Hyperlipidemia, unspecified: Secondary | ICD-10-CM | POA: Diagnosis present

## 2019-07-02 DIAGNOSIS — Z79899 Other long term (current) drug therapy: Secondary | ICD-10-CM

## 2019-07-02 DIAGNOSIS — I5032 Chronic diastolic (congestive) heart failure: Secondary | ICD-10-CM | POA: Diagnosis present

## 2019-07-02 DIAGNOSIS — Z9889 Other specified postprocedural states: Secondary | ICD-10-CM

## 2019-07-02 DIAGNOSIS — I4891 Unspecified atrial fibrillation: Secondary | ICD-10-CM | POA: Diagnosis not present

## 2019-07-02 DIAGNOSIS — Z20822 Contact with and (suspected) exposure to covid-19: Secondary | ICD-10-CM | POA: Diagnosis present

## 2019-07-02 DIAGNOSIS — I480 Paroxysmal atrial fibrillation: Secondary | ICD-10-CM | POA: Diagnosis present

## 2019-07-02 DIAGNOSIS — E119 Type 2 diabetes mellitus without complications: Secondary | ICD-10-CM | POA: Diagnosis present

## 2019-07-02 DIAGNOSIS — Z888 Allergy status to other drugs, medicaments and biological substances status: Secondary | ICD-10-CM

## 2019-07-02 DIAGNOSIS — I110 Hypertensive heart disease with heart failure: Secondary | ICD-10-CM | POA: Diagnosis present

## 2019-07-02 DIAGNOSIS — Z952 Presence of prosthetic heart valve: Secondary | ICD-10-CM

## 2019-07-02 DIAGNOSIS — Z8249 Family history of ischemic heart disease and other diseases of the circulatory system: Secondary | ICD-10-CM | POA: Diagnosis not present

## 2019-07-02 DIAGNOSIS — D62 Acute posthemorrhagic anemia: Secondary | ICD-10-CM | POA: Diagnosis not present

## 2019-07-02 DIAGNOSIS — I471 Supraventricular tachycardia: Secondary | ICD-10-CM | POA: Diagnosis not present

## 2019-07-02 HISTORY — PX: TEE WITHOUT CARDIOVERSION: SHX5443

## 2019-07-02 HISTORY — DX: Nonrheumatic aortic (valve) stenosis: I35.0

## 2019-07-02 HISTORY — PX: AORTIC VALVE REPLACEMENT: SHX41

## 2019-07-02 HISTORY — PX: CLIPPING OF ATRIAL APPENDAGE: SHX5773

## 2019-07-02 HISTORY — DX: Presence of xenogenic heart valve: Z95.3

## 2019-07-02 LAB — POCT I-STAT 7, (LYTES, BLD GAS, ICA,H+H)
Acid-Base Excess: 0 mmol/L (ref 0.0–2.0)
Acid-Base Excess: 1 mmol/L (ref 0.0–2.0)
Acid-Base Excess: 2 mmol/L (ref 0.0–2.0)
Acid-Base Excess: 0 mmol/L (ref 0.0–2.0)
Acid-base deficit: 4 mmol/L — ABNORMAL HIGH (ref 0.0–2.0)
Acid-base deficit: 1 mmol/L (ref 0.0–2.0)
Acid-base deficit: 2 mmol/L (ref 0.0–2.0)
Bicarbonate: 23.3 mmol/L (ref 20.0–28.0)
Bicarbonate: 26.6 mmol/L (ref 20.0–28.0)
Bicarbonate: 24.6 mmol/L (ref 20.0–28.0)
Bicarbonate: 26.7 mmol/L (ref 20.0–28.0)
Bicarbonate: 25.8 mmol/L (ref 20.0–28.0)
Bicarbonate: 28.3 mmol/L — ABNORMAL HIGH (ref 20.0–28.0)
Bicarbonate: 26.4 mmol/L (ref 20.0–28.0)
Calcium, Ion: 1.04 mmol/L — ABNORMAL LOW (ref 1.15–1.40)
Calcium, Ion: 1.09 mmol/L — ABNORMAL LOW (ref 1.15–1.40)
Calcium, Ion: 1.11 mmol/L — ABNORMAL LOW (ref 1.15–1.40)
Calcium, Ion: 1.1 mmol/L — ABNORMAL LOW (ref 1.15–1.40)
Calcium, Ion: 1.08 mmol/L — ABNORMAL LOW (ref 1.15–1.40)
Calcium, Ion: 1.09 mmol/L — ABNORMAL LOW (ref 1.15–1.40)
Calcium, Ion: 1.16 mmol/L (ref 1.15–1.40)
HCT: 28 % — ABNORMAL LOW (ref 36.0–46.0)
HCT: 30 % — ABNORMAL LOW (ref 36.0–46.0)
HCT: 27 % — ABNORMAL LOW (ref 36.0–46.0)
HCT: 28 % — ABNORMAL LOW (ref 36.0–46.0)
HCT: 29 % — ABNORMAL LOW (ref 36.0–46.0)
HCT: 29 % — ABNORMAL LOW (ref 36.0–46.0)
HCT: 29 % — ABNORMAL LOW (ref 36.0–46.0)
Hemoglobin: 9.5 g/dL — ABNORMAL LOW (ref 12.0–15.0)
Hemoglobin: 10.2 g/dL — ABNORMAL LOW (ref 12.0–15.0)
Hemoglobin: 9.2 g/dL — ABNORMAL LOW (ref 12.0–15.0)
Hemoglobin: 9.5 g/dL — ABNORMAL LOW (ref 12.0–15.0)
Hemoglobin: 9.9 g/dL — ABNORMAL LOW (ref 12.0–15.0)
Hemoglobin: 9.9 g/dL — ABNORMAL LOW (ref 12.0–15.0)
Hemoglobin: 9.9 g/dL — ABNORMAL LOW (ref 12.0–15.0)
O2 Saturation: 99 %
O2 Saturation: 99 %
O2 Saturation: 99 %
O2 Saturation: 100 %
O2 Saturation: 99 %
O2 Saturation: 99 %
O2 Saturation: 99 %
Patient temperature: 35.7
Patient temperature: 37.8
Patient temperature: 37.1
Patient temperature: 37.1
Patient temperature: 36.9
Patient temperature: 37
Patient temperature: 36.9
Potassium: 3.4 mmol/L — ABNORMAL LOW (ref 3.5–5.1)
Potassium: 4.9 mmol/L (ref 3.5–5.1)
Potassium: 4.4 mmol/L (ref 3.5–5.1)
Potassium: 4.5 mmol/L (ref 3.5–5.1)
Potassium: 4.4 mmol/L (ref 3.5–5.1)
Potassium: 4.4 mmol/L (ref 3.5–5.1)
Potassium: 4.2 mmol/L (ref 3.5–5.1)
Sodium: 141 mmol/L (ref 135–145)
Sodium: 139 mmol/L (ref 135–145)
Sodium: 140 mmol/L (ref 135–145)
Sodium: 141 mmol/L (ref 135–145)
Sodium: 141 mmol/L (ref 135–145)
Sodium: 141 mmol/L (ref 135–145)
Sodium: 139 mmol/L (ref 135–145)
TCO2: 24 mmol/L (ref 22–32)
TCO2: 28 mmol/L (ref 22–32)
TCO2: 26 mmol/L (ref 22–32)
TCO2: 28 mmol/L (ref 22–32)
TCO2: 28 mmol/L (ref 22–32)
TCO2: 30 mmol/L (ref 22–32)
TCO2: 28 mmol/L (ref 22–32)
pCO2 arterial: 31.5 mmHg — ABNORMAL LOW (ref 32.0–48.0)
pCO2 arterial: 46.6 mmHg (ref 32.0–48.0)
pCO2 arterial: 63.1 mmHg — ABNORMAL HIGH (ref 32.0–48.0)
pCO2 arterial: 59.9 mmHg — ABNORMAL HIGH (ref 32.0–48.0)
pCO2 arterial: 57.1 mmHg — ABNORMAL HIGH (ref 32.0–48.0)
pCO2 arterial: 54.5 mmHg — ABNORMAL HIGH (ref 32.0–48.0)
pCO2 arterial: 50.3 mmHg — ABNORMAL HIGH (ref 32.0–48.0)
pH, Arterial: 7.471 — ABNORMAL HIGH (ref 7.350–7.450)
pH, Arterial: 7.367 (ref 7.350–7.450)
pH, Arterial: 7.199 — CL (ref 7.350–7.450)
pH, Arterial: 7.258 — ABNORMAL LOW (ref 7.350–7.450)
pH, Arterial: 7.263 — ABNORMAL LOW (ref 7.350–7.450)
pH, Arterial: 7.323 — ABNORMAL LOW (ref 7.350–7.450)
pH, Arterial: 7.326 — ABNORMAL LOW (ref 7.350–7.450)
pO2, Arterial: 118 mmHg — ABNORMAL HIGH (ref 83.0–108.0)
pO2, Arterial: 124 mmHg — ABNORMAL HIGH (ref 83.0–108.0)
pO2, Arterial: 185 mmHg — ABNORMAL HIGH (ref 83.0–108.0)
pO2, Arterial: 201 mmHg — ABNORMAL HIGH (ref 83.0–108.0)
pO2, Arterial: 141 mmHg — ABNORMAL HIGH (ref 83.0–108.0)
pO2, Arterial: 127 mmHg — ABNORMAL HIGH (ref 83.0–108.0)
pO2, Arterial: 152 mmHg — ABNORMAL HIGH (ref 83.0–108.0)

## 2019-07-02 LAB — BLOOD GAS, ARTERIAL
Acid-Base Excess: 1.9 mmol/L (ref 0.0–2.0)
Bicarbonate: 27.8 mmol/L (ref 20.0–28.0)
FIO2: 32
O2 Saturation: 98.8 %
Patient temperature: 37
pCO2 arterial: 59.9 mmHg — ABNORMAL HIGH (ref 32.0–48.0)
pH, Arterial: 7.288 — ABNORMAL LOW (ref 7.350–7.450)
pO2, Arterial: 150 mmHg — ABNORMAL HIGH (ref 83.0–108.0)

## 2019-07-02 LAB — BASIC METABOLIC PANEL
Anion gap: 7 (ref 5–15)
BUN: 13 mg/dL (ref 8–23)
CO2: 25 mmol/L (ref 22–32)
Calcium: 7 mg/dL — ABNORMAL LOW (ref 8.9–10.3)
Chloride: 109 mmol/L (ref 98–111)
Creatinine, Ser: 0.63 mg/dL (ref 0.44–1.00)
GFR calc Af Amer: >60 mL/min (ref >60)
GFR calc non Af Amer: >60 mL/min (ref >60)
Glucose, Bld: 119 mg/dL — ABNORMAL HIGH (ref 70–99)
Potassium: 4.5 mmol/L (ref 3.5–5.1)
Sodium: 141 mmol/L (ref 135–145)

## 2019-07-02 LAB — CBC
HCT: 29.9 % — ABNORMAL LOW (ref 36.0–46.0)
HCT: 30.2 % — ABNORMAL LOW (ref 36.0–46.0)
Hemoglobin: 9.7 g/dL — ABNORMAL LOW (ref 12.0–15.0)
Hemoglobin: 9.8 g/dL — ABNORMAL LOW (ref 12.0–15.0)
MCH: 29 pg (ref 26.0–34.0)
MCH: 29.4 pg (ref 26.0–34.0)
MCHC: 32.4 g/dL (ref 30.0–36.0)
MCHC: 32.5 g/dL (ref 30.0–36.0)
MCV: 89.3 fL (ref 80.0–100.0)
MCV: 90.7 fL (ref 80.0–100.0)
Platelets: 162 10*3/uL (ref 150–400)
Platelets: 178 10*3/uL (ref 150–400)
RBC: 3.35 MIL/uL — ABNORMAL LOW (ref 3.87–5.11)
RBC: 3.33 MIL/uL — ABNORMAL LOW (ref 3.87–5.11)
RDW: 13.8 % (ref 11.5–15.5)
RDW: 14.1 % (ref 11.5–15.5)
WBC: 8.9 10*3/uL (ref 4.0–10.5)
WBC: 13.9 10*3/uL — ABNORMAL HIGH (ref 4.0–10.5)
nRBC: 0 % (ref 0.0–0.2)
nRBC: 0 % (ref 0.0–0.2)

## 2019-07-02 LAB — MAGNESIUM: Magnesium: 3 mg/dL — ABNORMAL HIGH (ref 1.7–2.4)

## 2019-07-02 LAB — GLUCOSE, CAPILLARY
Glucose-Capillary: 174 mg/dL — ABNORMAL HIGH (ref 70–99)
Glucose-Capillary: 113 mg/dL — ABNORMAL HIGH (ref 70–99)
Glucose-Capillary: 82 mg/dL (ref 70–99)
Glucose-Capillary: 113 mg/dL — ABNORMAL HIGH (ref 70–99)
Glucose-Capillary: 116 mg/dL — ABNORMAL HIGH (ref 70–99)
Glucose-Capillary: 115 mg/dL — ABNORMAL HIGH (ref 70–99)
Glucose-Capillary: 103 mg/dL — ABNORMAL HIGH (ref 70–99)
Glucose-Capillary: 170 mg/dL — ABNORMAL HIGH (ref 70–99)
Glucose-Capillary: 151 mg/dL — ABNORMAL HIGH (ref 70–99)
Glucose-Capillary: 123 mg/dL — ABNORMAL HIGH (ref 70–99)
Glucose-Capillary: 129 mg/dL — ABNORMAL HIGH (ref 70–99)
Glucose-Capillary: 125 mg/dL — ABNORMAL HIGH (ref 70–99)

## 2019-07-02 LAB — HEMOGLOBIN AND HEMATOCRIT, BLOOD
HCT: 20.8 % — ABNORMAL LOW (ref 36.0–46.0)
Hemoglobin: 6.5 g/dL — CL (ref 12.0–15.0)

## 2019-07-02 LAB — PLATELET COUNT: Platelets: 182 10*3/uL (ref 150–400)

## 2019-07-02 LAB — APTT: aPTT: 40 seconds — ABNORMAL HIGH (ref 24–36)

## 2019-07-02 LAB — PROTIME-INR
INR: 1.6 — ABNORMAL HIGH (ref 0.8–1.2)
Prothrombin Time: 18.6 seconds — ABNORMAL HIGH (ref 11.4–15.2)

## 2019-07-02 LAB — PREPARE RBC (CROSSMATCH)

## 2019-07-02 SURGERY — REPLACEMENT, AORTIC VALVE, OPEN
Anesthesia: General | Site: Chest

## 2019-07-02 MED ORDER — ROCURONIUM BROMIDE 10 MG/ML (PF) SYRINGE
PREFILLED_SYRINGE | INTRAVENOUS | Status: DC | PRN
  Administered 2019-07-02: 50 mg via INTRAVENOUS
  Administered 2019-07-02: 70 mg via INTRAVENOUS
  Administered 2019-07-02 (×2): 30 mg via INTRAVENOUS

## 2019-07-02 MED ORDER — CARVEDILOL 3.125 MG PO TABS: 3.1250 mg | ORAL_TABLET | Freq: Two times a day (BID) | ORAL | Status: DC

## 2019-07-02 MED ORDER — FAMOTIDINE IN NACL 20-0.9 MG/50ML-% IV SOLN
INTRAVENOUS | Status: AC
  Administered 2019-07-02: 20 mg via INTRAVENOUS
  Filled 2019-07-02: qty 50

## 2019-07-02 MED ORDER — SODIUM CHLORIDE 0.9 % IV SOLN: INTRAVENOUS | Status: DC

## 2019-07-02 MED ORDER — LACTATED RINGERS IV SOLN: INTRAVENOUS | Status: DC

## 2019-07-02 MED ORDER — LORAZEPAM 1 MG PO TABS
1.0000 mg | ORAL_TABLET | Freq: Every evening | ORAL | Status: DC | PRN
  Administered 2019-07-04: 1 mg via ORAL
  Filled 2019-07-02 (×2): qty 1

## 2019-07-02 MED ORDER — ALBUMIN HUMAN 5 % IV SOLN
250.0000 mL | INTRAVENOUS | Status: AC | PRN
  Administered 2019-07-02 (×4): 12.5 g via INTRAVENOUS
  Filled 2019-07-02 (×2): qty 250

## 2019-07-02 MED ORDER — FAMOTIDINE IN NACL 20-0.9 MG/50ML-% IV SOLN
20.0000 mg | Freq: Two times a day (BID) | INTRAVENOUS | Status: AC
  Administered 2019-07-02: 20 mg via INTRAVENOUS
  Filled 2019-07-02: qty 50

## 2019-07-02 MED ORDER — ACETAMINOPHEN 500 MG PO TABS
1000.0000 mg | ORAL_TABLET | Freq: Four times a day (QID) | ORAL | Status: DC
  Administered 2019-07-03 – 2019-07-04 (×5): 1000 mg via ORAL
  Filled 2019-07-02 (×5): qty 2

## 2019-07-02 MED ORDER — SODIUM CHLORIDE 0.45 % IV SOLN: INTRAVENOUS | Status: DC | PRN

## 2019-07-02 MED ORDER — FENTANYL CITRATE (PF) 250 MCG/5ML IJ SOLN
INTRAMUSCULAR | Status: DC | PRN
  Administered 2019-07-02: 50 ug via INTRAVENOUS
  Administered 2019-07-02: 100 ug via INTRAVENOUS
  Administered 2019-07-02: 50 ug via INTRAVENOUS
  Administered 2019-07-02: 250 ug via INTRAVENOUS
  Administered 2019-07-02: 100 ug via INTRAVENOUS
  Administered 2019-07-02: 250 ug via INTRAVENOUS
  Administered 2019-07-02: 25 ug via INTRAVENOUS
  Administered 2019-07-02: 50 ug via INTRAVENOUS
  Administered 2019-07-02 (×3): 100 ug via INTRAVENOUS
  Administered 2019-07-02: 25 ug via INTRAVENOUS
  Administered 2019-07-02: 250 ug via INTRAVENOUS
  Administered 2019-07-02: 50 ug via INTRAVENOUS

## 2019-07-02 MED ORDER — INSULIN REGULAR(HUMAN) IN NACL 100-0.9 UT/100ML-% IV SOLN: INTRAVENOUS | Status: DC

## 2019-07-02 MED ORDER — THROMBIN (RECOMBINANT) 20000 UNITS EX SOLR
CUTANEOUS | Status: AC
  Filled 2019-07-02: qty 20000

## 2019-07-02 MED ORDER — SODIUM CHLORIDE 0.9 % IV SOLN
1.5000 g | Freq: Two times a day (BID) | INTRAVENOUS | Status: AC
  Administered 2019-07-02 – 2019-07-04 (×4): 1.5 g via INTRAVENOUS
  Filled 2019-07-02 (×4): qty 1.5

## 2019-07-02 MED ORDER — THROMBIN 20000 UNITS EX SOLR
CUTANEOUS | Status: DC | PRN
  Administered 2019-07-02: 20000 [IU] via TOPICAL

## 2019-07-02 MED ORDER — HEMOSTATIC AGENTS (NO CHARGE) OPTIME
TOPICAL | Status: DC | PRN
  Administered 2019-07-02: 1 via TOPICAL

## 2019-07-02 MED ORDER — METOPROLOL TARTRATE 12.5 MG HALF TABLET: 12.5000 mg | ORAL_TABLET | Freq: Once | ORAL | Status: DC

## 2019-07-02 MED ORDER — NITROGLYCERIN IN D5W 200-5 MCG/ML-% IV SOLN: 0.0000 ug/min | INTRAVENOUS | Status: DC

## 2019-07-02 MED ORDER — PHENYLEPHRINE 40 MCG/ML (10ML) SYRINGE FOR IV PUSH (FOR BLOOD PRESSURE SUPPORT)
PREFILLED_SYRINGE | INTRAVENOUS | Status: DC | PRN
  Administered 2019-07-02 (×2): 40 ug via INTRAVENOUS

## 2019-07-02 MED ORDER — ARTIFICIAL TEARS OPHTHALMIC OINT
TOPICAL_OINTMENT | OPHTHALMIC | Status: DC | PRN
  Administered 2019-07-02: 1 via OPHTHALMIC

## 2019-07-02 MED ORDER — LACTATED RINGERS IV SOLN: INTRAVENOUS | Status: DC | PRN

## 2019-07-02 MED ORDER — LACTATED RINGERS IV SOLN
500.0000 mL | Freq: Once | INTRAVENOUS | Status: AC | PRN
  Administered 2019-07-02: 200 mL via INTRAVENOUS

## 2019-07-02 MED ORDER — GABAPENTIN 600 MG PO TABS
300.0000 mg | ORAL_TABLET | Freq: Every evening | ORAL | Status: DC | PRN
  Administered 2019-07-08 – 2019-07-09 (×2): 300 mg via ORAL
  Filled 2019-07-02 (×2): qty 1

## 2019-07-02 MED ORDER — LEVALBUTEROL HCL 0.63 MG/3ML IN NEBU: 0.6300 mg | INHALATION_SOLUTION | Freq: Three times a day (TID) | RESPIRATORY_TRACT | Status: DC | PRN

## 2019-07-02 MED ORDER — BISACODYL 5 MG PO TBEC
10.0000 mg | DELAYED_RELEASE_TABLET | Freq: Every day | ORAL | Status: DC
  Administered 2019-07-03 – 2019-07-04 (×2): 10 mg via ORAL
  Filled 2019-07-02 (×2): qty 2

## 2019-07-02 MED ORDER — ACETAMINOPHEN 650 MG RE SUPP
650.0000 mg | Freq: Once | RECTAL | Status: AC
  Administered 2019-07-02: 650 mg via RECTAL

## 2019-07-02 MED ORDER — BUPROPION HCL ER (XL) 300 MG PO TB24
300.0000 mg | ORAL_TABLET | Freq: Every morning | ORAL | Status: DC
  Administered 2019-07-03 – 2019-07-12 (×10): 300 mg via ORAL
  Filled 2019-07-02 (×10): qty 1

## 2019-07-02 MED ORDER — METOPROLOL TARTRATE 5 MG/5ML IV SOLN: 2.5000 mg | INTRAVENOUS | Status: DC | PRN

## 2019-07-02 MED ORDER — SODIUM CHLORIDE 0.9% FLUSH
3.0000 mL | Freq: Two times a day (BID) | INTRAVENOUS | Status: DC
  Administered 2019-07-03: 22:00:00 3 mL via INTRAVENOUS
  Administered 2019-07-03: 10 mL via INTRAVENOUS

## 2019-07-02 MED ORDER — ASPIRIN 81 MG PO CHEW: 324.0000 mg | CHEWABLE_TABLET | Freq: Every day | ORAL | Status: DC

## 2019-07-02 MED ORDER — CHLORHEXIDINE GLUCONATE 0.12 % MT SOLN
15.0000 mL | Freq: Once | OROMUCOSAL | Status: AC
  Administered 2019-07-02 – 2019-07-03 (×2): 15 mL via OROMUCOSAL
  Filled 2019-07-02: qty 15

## 2019-07-02 MED ORDER — ORAL CARE MOUTH RINSE
15.0000 mL | Freq: Two times a day (BID) | OROMUCOSAL | Status: DC
  Administered 2019-07-02 – 2019-07-12 (×14): 15 mL via OROMUCOSAL

## 2019-07-02 MED ORDER — FENTANYL CITRATE (PF) 250 MCG/5ML IJ SOLN
INTRAMUSCULAR | Status: AC
  Filled 2019-07-02: qty 25

## 2019-07-02 MED ORDER — DEXTROSE 50 % IV SOLN: 0.0000 mL | INTRAVENOUS | Status: DC | PRN

## 2019-07-02 MED ORDER — DOCUSATE SODIUM 100 MG PO CAPS
200.0000 mg | ORAL_CAPSULE | Freq: Every day | ORAL | Status: DC
  Administered 2019-07-03 – 2019-07-04 (×2): 200 mg via ORAL
  Filled 2019-07-02 (×2): qty 2

## 2019-07-02 MED ORDER — PROPOFOL 10 MG/ML IV BOLUS
INTRAVENOUS | Status: AC
  Filled 2019-07-02: qty 20

## 2019-07-02 MED ORDER — SODIUM CHLORIDE 0.9 % IV SOLN
250.0000 mL | INTRAVENOUS | Status: DC
  Administered 2019-07-03 (×2): 250 mL via INTRAVENOUS

## 2019-07-02 MED ORDER — CHLORHEXIDINE GLUCONATE 0.12% ORAL RINSE (MEDLINE KIT): 15.0000 mL | Freq: Two times a day (BID) | OROMUCOSAL | Status: DC

## 2019-07-02 MED ORDER — OXYCODONE HCL 5 MG PO TABS: 5.0000 mg | ORAL_TABLET | ORAL | Status: DC | PRN

## 2019-07-02 MED ORDER — MIDAZOLAM HCL (PF) 10 MG/2ML IJ SOLN
INTRAMUSCULAR | Status: AC
  Filled 2019-07-02: qty 2

## 2019-07-02 MED ORDER — CHLORHEXIDINE GLUCONATE 4 % EX LIQD: 30.0000 mL | CUTANEOUS | Status: DC

## 2019-07-02 MED ORDER — ACETAMINOPHEN 160 MG/5ML PO SOLN: 650.0000 mg | Freq: Once | ORAL | Status: AC

## 2019-07-02 MED ORDER — ONDANSETRON HCL 4 MG/2ML IJ SOLN
4.0000 mg | Freq: Four times a day (QID) | INTRAMUSCULAR | Status: DC | PRN
  Administered 2019-07-02 – 2019-07-03 (×4): 4 mg via INTRAVENOUS
  Filled 2019-07-02 (×4): qty 2

## 2019-07-02 MED ORDER — HEPARIN SODIUM (PORCINE) 1000 UNIT/ML IJ SOLN
INTRAMUSCULAR | Status: DC | PRN
  Administered 2019-07-02: 23000 [IU] via INTRAVENOUS

## 2019-07-02 MED ORDER — MIDAZOLAM HCL 5 MG/5ML IJ SOLN
INTRAMUSCULAR | Status: DC | PRN
  Administered 2019-07-02: 1 mg via INTRAVENOUS
  Administered 2019-07-02: .5 mg via INTRAVENOUS
  Administered 2019-07-02: 1 mg via INTRAVENOUS
  Administered 2019-07-02: 2.5 mg via INTRAVENOUS
  Administered 2019-07-02: 1 mg via INTRAVENOUS
  Administered 2019-07-02: 2 mg via INTRAVENOUS

## 2019-07-02 MED ORDER — SODIUM CHLORIDE 0.9 % IV SOLN: INTRAVENOUS | Status: DC | PRN

## 2019-07-02 MED ORDER — SODIUM BICARBONATE 8.4 % IV SOLN
50.0000 meq | Freq: Once | INTRAVENOUS | Status: AC
  Administered 2019-07-02: 50 meq via INTRAVENOUS

## 2019-07-02 MED ORDER — 0.9 % SODIUM CHLORIDE (POUR BTL) OPTIME
TOPICAL | Status: DC | PRN
  Administered 2019-07-02: 6000 mL

## 2019-07-02 MED ORDER — TRAMADOL HCL 50 MG PO TABS
50.0000 mg | ORAL_TABLET | ORAL | Status: DC | PRN
  Administered 2019-07-03 – 2019-07-04 (×4): 50 mg via ORAL
  Filled 2019-07-02 (×4): qty 1

## 2019-07-02 MED ORDER — ALBUMIN HUMAN 5 % IV SOLN: INTRAVENOUS | Status: DC | PRN

## 2019-07-02 MED ORDER — MAGNESIUM SULFATE 4 GM/100ML IV SOLN
4.0000 g | Freq: Once | INTRAVENOUS | Status: AC
  Administered 2019-07-02: 4 g via INTRAVENOUS
  Filled 2019-07-02: qty 100

## 2019-07-02 MED ORDER — FENTANYL CITRATE (PF) 250 MCG/5ML IJ SOLN
INTRAMUSCULAR | Status: AC
  Filled 2019-07-02: qty 5

## 2019-07-02 MED ORDER — CHLORHEXIDINE GLUCONATE 0.12 % MT SOLN
15.0000 mL | OROMUCOSAL | Status: AC
  Administered 2019-07-02: 15 mL via OROMUCOSAL

## 2019-07-02 MED ORDER — ACETYLCYSTEINE 500 MG PO CAPS: 3.0000 | ORAL_CAPSULE | Freq: Every day | ORAL | Status: DC

## 2019-07-02 MED ORDER — PROTAMINE SULFATE 10 MG/ML IV SOLN
INTRAVENOUS | Status: DC | PRN
  Administered 2019-07-02: 210 mg via INTRAVENOUS
  Administered 2019-07-02: 20 mg via INTRAVENOUS

## 2019-07-02 MED ORDER — PHENYLEPHRINE HCL-NACL 20-0.9 MG/250ML-% IV SOLN
0.0000 ug/min | INTRAVENOUS | Status: DC
  Administered 2019-07-02: 15 ug/min via INTRAVENOUS
  Filled 2019-07-02: qty 250

## 2019-07-02 MED ORDER — DEXMEDETOMIDINE HCL IN NACL 400 MCG/100ML IV SOLN: 0.0000 ug/kg/h | INTRAVENOUS | Status: DC

## 2019-07-02 MED ORDER — PANTOPRAZOLE SODIUM 40 MG PO TBEC
40.0000 mg | DELAYED_RELEASE_TABLET | Freq: Every day | ORAL | Status: DC
  Administered 2019-07-04: 09:00:00 40 mg via ORAL
  Filled 2019-07-02: qty 1

## 2019-07-02 MED ORDER — THROMBIN 20000 UNITS EX SOLR
OROMUCOSAL | Status: DC | PRN
  Administered 2019-07-02 (×3): 4 mL via TOPICAL

## 2019-07-02 MED ORDER — BISACODYL 10 MG RE SUPP: 10.0000 mg | Freq: Every day | RECTAL | Status: DC

## 2019-07-02 MED ORDER — MIDAZOLAM HCL 2 MG/2ML IJ SOLN: 2.0000 mg | INTRAMUSCULAR | Status: DC | PRN

## 2019-07-02 MED ORDER — MORPHINE SULFATE (PF) 2 MG/ML IV SOLN
1.0000 mg | INTRAVENOUS | Status: DC | PRN
  Administered 2019-07-03: 2 mg via INTRAVENOUS
  Filled 2019-07-02: qty 1

## 2019-07-02 MED ORDER — POTASSIUM CHLORIDE 10 MEQ/50ML IV SOLN
10.0000 meq | INTRAVENOUS | Status: AC
  Administered 2019-07-02 (×3): 10 meq via INTRAVENOUS

## 2019-07-02 MED ORDER — ASPIRIN EC 325 MG PO TBEC
325.0000 mg | DELAYED_RELEASE_TABLET | Freq: Every day | ORAL | Status: DC
  Administered 2019-07-03 – 2019-07-04 (×2): 325 mg via ORAL
  Filled 2019-07-02 (×2): qty 1

## 2019-07-02 MED ORDER — VANCOMYCIN HCL IN DEXTROSE 1-5 GM/200ML-% IV SOLN
1000.0000 mg | Freq: Once | INTRAVENOUS | Status: AC
  Administered 2019-07-02: 1000 mg via INTRAVENOUS

## 2019-07-02 MED ORDER — SODIUM CHLORIDE 0.9% FLUSH: 3.0000 mL | INTRAVENOUS | Status: DC | PRN

## 2019-07-02 MED ORDER — PROPOFOL 10 MG/ML IV BOLUS
INTRAVENOUS | Status: DC | PRN
  Administered 2019-07-02: 80 mg via INTRAVENOUS

## 2019-07-02 MED ORDER — ACETAMINOPHEN 160 MG/5ML PO SOLN: 1000.0000 mg | Freq: Four times a day (QID) | ORAL | Status: DC

## 2019-07-02 MED ORDER — CHLORHEXIDINE GLUCONATE CLOTH 2 % EX PADS
6.0000 | MEDICATED_PAD | Freq: Every day | CUTANEOUS | Status: DC
  Administered 2019-07-02 – 2019-07-03 (×2): 6 via TOPICAL

## 2019-07-02 MED ORDER — ORAL CARE MOUTH RINSE
15.0000 mL | OROMUCOSAL | Status: DC
  Administered 2019-07-02 (×2): 15 mL via OROMUCOSAL

## 2019-07-02 SURGICAL SUPPLY — 77 items
ADAPTER CARDIO PERF ANTE/RETRO (ADAPTER) ×4 IMPLANT
ARTICLIP LAA PROCLIP II 40 (Clip) ×3 IMPLANT
ARTICLIP LAA PROCLIP II 40MM (Clip) ×1 IMPLANT
BAG DECANTER FOR FLEXI CONT (MISCELLANEOUS) ×4 IMPLANT
BLADE CLIPPER SURG (BLADE) ×4 IMPLANT
BLADE STERNUM SYSTEM 6 (BLADE) ×4 IMPLANT
BLADE SURG 15 STRL LF DISP TIS (BLADE) ×2 IMPLANT
BLADE SURG 15 STRL SS (BLADE) ×2
CANISTER SUCT 3000ML PPV (MISCELLANEOUS) ×4 IMPLANT
CANNULA GUNDRY RCSP 15FR (MISCELLANEOUS) ×4 IMPLANT
CATH HEART VENT LEFT (CATHETERS) ×2 IMPLANT
CATH ROBINSON RED A/P 18FR (CATHETERS) ×12 IMPLANT
CATH THORACIC 36FR (CATHETERS) ×4 IMPLANT
CATH THORACIC 36FR RT ANG (CATHETERS) ×4 IMPLANT
CNTNR URN SCR LID CUP LEK RST (MISCELLANEOUS) ×4 IMPLANT
CONT SPEC 4OZ STRL OR WHT (MISCELLANEOUS) ×4
COVER SURGICAL LIGHT HANDLE (MISCELLANEOUS) ×4 IMPLANT
DEVICE ATRICLIP LAA PRCLPII 40 (Clip) ×2 IMPLANT
DRAPE CARDIOVASCULAR INCISE (DRAPES) ×2
DRAPE SLUSH/WARMER DISC (DRAPES) ×4 IMPLANT
DRAPE SRG 135X102X78XABS (DRAPES) ×2 IMPLANT
DRSG COVADERM 4X14 (GAUZE/BANDAGES/DRESSINGS) ×4 IMPLANT
ELECT CAUTERY BLADE 6.4 (BLADE) ×4 IMPLANT
ELECT REM PT RETURN 9FT ADLT (ELECTROSURGICAL) ×8
ELECTRODE REM PT RTRN 9FT ADLT (ELECTROSURGICAL) ×4 IMPLANT
FELT TEFLON 1X6 (MISCELLANEOUS) ×12 IMPLANT
GAUZE SPONGE 4X4 12PLY STRL (GAUZE/BANDAGES/DRESSINGS) ×4 IMPLANT
GLOVE BIO SURGEON STRL SZ 6 (GLOVE) IMPLANT
GLOVE BIO SURGEON STRL SZ 6.5 (GLOVE) ×12 IMPLANT
GLOVE BIO SURGEON STRL SZ7 (GLOVE) ×8 IMPLANT
GLOVE BIO SURGEON STRL SZ7.5 (GLOVE) IMPLANT
GLOVE BIO SURGEON STRL SZ8 (GLOVE) ×4 IMPLANT
GLOVE BIO SURGEONS STRL SZ 6.5 (GLOVE) ×4
GLOVE BIOGEL PI IND STRL 6 (GLOVE) ×4 IMPLANT
GLOVE BIOGEL PI IND STRL 8 (GLOVE) ×4 IMPLANT
GLOVE BIOGEL PI INDICATOR 6 (GLOVE) ×4
GLOVE BIOGEL PI INDICATOR 8 (GLOVE) ×4
GLOVE SS BIOGEL STRL SZ 7 (GLOVE) ×4 IMPLANT
GLOVE SUPERSENSE BIOGEL SZ 7 (GLOVE) ×4
GOWN STRL REUS W/ TWL LRG LVL3 (GOWN DISPOSABLE) ×16 IMPLANT
GOWN STRL REUS W/ TWL XL LVL3 (GOWN DISPOSABLE) ×2 IMPLANT
GOWN STRL REUS W/TWL LRG LVL3 (GOWN DISPOSABLE) ×16
GOWN STRL REUS W/TWL XL LVL3 (GOWN DISPOSABLE) ×2
HEMOSTAT POWDER SURGIFOAM 1G (HEMOSTASIS) ×12 IMPLANT
HEMOSTAT SURGICEL 2X14 (HEMOSTASIS) ×4 IMPLANT
KIT BASIN OR (CUSTOM PROCEDURE TRAY) ×4 IMPLANT
KIT CATH CPB BARTLE (MISCELLANEOUS) ×4 IMPLANT
KIT SUCTION CATH 14FR (SUCTIONS) ×4 IMPLANT
KIT TURNOVER KIT B (KITS) ×4 IMPLANT
LINE VENT (MISCELLANEOUS) ×4 IMPLANT
NS IRRIG 1000ML POUR BTL (IV SOLUTION) ×24 IMPLANT
PACK E OPEN HEART (SUTURE) ×4 IMPLANT
PACK OPEN HEART (CUSTOM PROCEDURE TRAY) ×4 IMPLANT
PAD ARMBOARD 7.5X6 YLW CONV (MISCELLANEOUS) ×8 IMPLANT
POSITIONER HEAD DONUT 9IN (MISCELLANEOUS) ×4 IMPLANT
SET CARDIOPLEGIA MPS 5001102 (MISCELLANEOUS) ×4 IMPLANT
SUT BONE WAX W31G (SUTURE) ×4 IMPLANT
SUT ETHIBON 2 0 V 52N 30 (SUTURE) ×16 IMPLANT
SUT ETHIBON EXCEL 2-0 V-5 (SUTURE) ×8 IMPLANT
SUT ETHIBOND V-5 VALVE (SUTURE) ×8 IMPLANT
SUT PROLENE 3 0 SH DA (SUTURE) ×8 IMPLANT
SUT PROLENE 3 0 SH1 36 (SUTURE) ×4 IMPLANT
SUT PROLENE 4 0 RB 1 (SUTURE) ×12
SUT PROLENE 4-0 RB1 .5 CRCL 36 (SUTURE) ×12 IMPLANT
SUT STEEL 6MS V (SUTURE) ×8 IMPLANT
SUT VIC AB 1 CTX 36 (SUTURE) ×6
SUT VIC AB 1 CTX36XBRD ANBCTR (SUTURE) ×6 IMPLANT
SYSTEM SAHARA CHEST DRAIN ATS (WOUND CARE) ×4 IMPLANT
TAPE CLOTH SURG 4X10 WHT LF (GAUZE/BANDAGES/DRESSINGS) ×4 IMPLANT
TAPE PAPER 2X10 WHT MICROPORE (GAUZE/BANDAGES/DRESSINGS) ×4 IMPLANT
TOWEL GREEN STERILE (TOWEL DISPOSABLE) ×4 IMPLANT
TOWEL GREEN STERILE FF (TOWEL DISPOSABLE) ×4 IMPLANT
TRAY FOLEY SLVR 16FR TEMP STAT (SET/KITS/TRAYS/PACK) ×4 IMPLANT
UNDERPAD 30X30 (UNDERPADS AND DIAPERS) ×4 IMPLANT
VALVE AORTIC SZ21 INSP/RESIL (Valve) ×4 IMPLANT
VENT LEFT HEART 12002 (CATHETERS) ×4
WATER STERILE IRR 1000ML POUR (IV SOLUTION) ×8 IMPLANT

## 2019-07-02 NOTE — Progress Notes (Signed)
EVENING ROUNDS NOTE :     [address]       [place] [address]             [phone]                 Day of Surgery Procedure(s) (LRB): AORTIC VALVE REPLACEMENT (AVR) USING INSPIRIS VALVE SIZE 21MM (N/A) CLIPPING OF ATRIAL APPENDAGE USING PRO2 CLIP SIZE 40MM (N/A) TRANSESOPHAGEAL ECHOCARDIOGRAM (TEE) (N/A)   Total Length of Stay:  LOS: 0 days  Events:  Extubated Minimal CT output High CO2 on gas, acidotic Receiving amp of bicarb Will recheck after dose    BP (!) 87/43   Pulse 88   Temp 98.8 F (37.1 C)   Resp 13   Ht 5\' 2"  (1.575 m)   Wt 63.8 kg   LMP  (LMP Unknown)   SpO2 99%   BMI 25.73 kg/m   PAP: (15-26)/(6-20) 19/14 CO:  [3.1 L/min-3.7 L/min] 3.1 L/min CI:  [1.9 L/min/m2-2.2 L/min/m2] 1.9 L/min/m2  Vent Mode: PSV;CPAP FiO2 (%):  [40 %-50 %] 40 % Set Rate:  [4 bmp-12 bmp] 4 bmp Vt Set:  [500 mL] 500 mL PEEP:  [5 cmH20] 5 cmH20 Pressure Support:  [10 cmH20] 10 cmH20  . sodium chloride Stopped (07/02/19 1547)  . [START ON 07/03/2019] sodium chloride    . sodium chloride 10 mL/hr at 07/02/19 1200  . cefUROXime (ZINACEF)  IV 200 mL/hr at 07/02/19 1600  . dexmedetomidine (PRECEDEX) IV infusion 0.7 mcg/kg/hr (07/02/19 1203)  . famotidine (PEPCID) IV Stopped (07/02/19 1238)  . insulin 1.3 mL/hr at 07/02/19 1600  . lactated ringers    . lactated ringers    . lactated ringers 20 mL/hr at 07/02/19 1600  . magnesium sulfate 20 mL/hr at 07/02/19 1600  . nitroGLYCERIN Stopped (07/02/19 1520)  . phenylephrine (NEO-SYNEPHRINE) Adult infusion 10 mcg/min (07/02/19 1600)  . vancomycin      No intake/output data recorded.   CBC Latest Ref Rng & Units 07/02/2019 07/02/2019 07/02/2019  WBC 4.0 - 10.5 K/uL - 8.9 -  Hemoglobin 12.0 - 15.0 g/dL 9.5(L) 9.7(L) 6.5(LL)  Hematocrit 36.0 - 46.0 % 28.0(L) 29.9(L) 20.8(L)  Platelets 150 - 400 K/uL - 162 182    BMP Latest Ref Rng & Units 07/02/2019 06/30/2019 05/20/2019  Glucose 70 - 99 mg/dL - 142(H) -    BUN 8 - 23 mg/dL - 15 -  Creatinine 0.44 - 1.00 mg/dL - 0.80 -  BUN/Creat Ratio 12 - 28 - - -  Sodium 135 - 145 mmol/L 141 136 140  Potassium 3.5 - 5.1 mmol/L 3.4(L) 4.2 3.8  Chloride 98 - 111 mmol/L - 97(L) -  CO2 22 - 32 mmol/L - 29 -  Calcium 8.9 - 10.3 mg/dL - 9.6 -    ABG    Component Value Date/Time   PHART 7.471 (H) 07/02/2019 1204   PCO2ART 31.5 (L) 07/02/2019 1204   PO2ART 118 (H) 07/02/2019 1204   HCO3 23.3 07/02/2019 1204   TCO2 24 07/02/2019 1204   ACIDBASEDEF 1.0 05/20/2019 0859   O2SAT 99.0 07/02/2019 1204       Lindsay Bouillon, MD 07/02/2019 5:08 PM

## 2019-07-02 NOTE — Procedures (Signed)
Extubation Procedure Note  Patient Details:   Name: Lindsay Guerrero DOB: 1955/09/25 MRN: [id number]   Airway Documentation:    Vent end date: 07/02/19 Vent end time: 1540   Evaluation  O2 sats: stable throughout Complications: No apparent complications Patient did tolerate procedure well. Bilateral Breath Sounds: Clear   Yes   Patient extubated per protocol to 4L [place] with no apparent complications. Positive cuff leak was noted prior to extubation. Patient achieved NIF of -24 and VC of 1.3L with great patient effort. Patient is alert and oriented and is able to speak. Vitals are stable. RT will continue to monitor.   Anshu Wehner Clyda Greener 07/02/2019, 3:49 PM

## 2019-07-02 NOTE — Discharge Summary (Signed)
Physician Discharge Summary       [address]       [place] [address]             [phone]    Patient ID: Lindsay Guerrero MRN: [id number] DOB/AGE: 09/05/1955 64 y.o.  Admit date: 07/02/2019 Discharge date: 07/12/2019  Admission Diagnoses: 1. Severe aortic stenosis 2. Atrial fibrillation ([place])  Discharge Diagnoses:  1. S/p AVR and LA clip 2. Expected post op blood loss anemia 3. History of essential hypertension 4. History of hyperlipidemia 5. History of mild to moderate MR 6. History of seborrheic keratoses 7. History of breast cancer 8. History of anxiety state 46'  Consults: cardiology  Procedure (s):  1. Median Sternotomy 2. Extracorporeal circulation 3.   Aortic valve replacement using a 21 mm Edwards INSPIRIS RESILIA pericardial valve. 4.   Clipping of left atrial appendage by Dr. Cyndia Bent on 07/02/2019.  DCCV 07/10/2019.  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 Mean gradient at that time was 25 mmHg with a peak gradient of 40 mmHg. Aortic valve area by VTI was 0.77 cm. She reports a couple year history of slowly progressive exertional fatigue and shortness of breath which is gradually decreased her activity level. She said that it is worsened over the past several months and she is exhausted at the end of the day. She has had some intermittent episodes of mild dizziness. She denies any chest pain or pressure. She has had no peripheral edema. She saw Dr. Agustin Cree and had a repeat echocardiogram on 04/23/2019. This showed progression of her aortic stenosis with a mean gradient of 36 mmHg and peak gradient of 60 mmHg. Aortic valve area was 0.73 cm with a dimensionless index of 0.23. The aortic valve was moderately thickened and calcified with  restricted leaflet mobility. There is mild aortic insufficiency. Left ventricular systolic function is normal with grade 2 diastolic dysfunction. There is asymmetric septal hypertrophy. She underwent cardiac catheterization on 05/20/2019 which showed widely patent coronary arteries with minimal nonobstructive coronary disease. The mean gradient was measured at 26 mmHg with a valve area of 1.11 cm. Right heart pressures were normal.  She is married and lives with her husband who is currently overseas working.   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 Cyndia Bent personally reviewed her 2D echocardiogram, cardiac catheterization, and CTA studies. Her echocardiogram shows a trileaflet aortic valve with moderate calcification and thickening as well as restricted leaflet mobility. The mean gradient is 36 mmHg with a valve area of 0.73 cm and a dimensionless index of 0.23 consistent with severe aortic stenosis. Left ventricular systolic function is normal with grade 2 diastolic dysfunction. Her cardiac catheterization shows essentially normal coronary arteries. Dr. Cyndia Bent agreed that aortic valve replacement is indicated in this patient for relief of her symptoms and to prevent progressive left ventricular deterioration. Dr. Cyndia Bent discussed the options of open surgical aortic valve replacement and transcatheter aortic valve replacement. Dr. Cyndia Bent discussed the pros and cons of both. Dr. Cyndia Bent told her that he thought open surgical aortic valve replacement was the best option at her age  and would likely give her the best long-term result. Her gated cardiac CTA shows anatomy suitable for transcatheter aortic valve replacement but Dr. Cyndia Bent discussed the  concerns about unknown long-term durability and risk of perivalvular leak in her age group.  Dr. Cyndia Bent reviewed the cardiac catheterization and echocardiogram images with her and her daughter and answered all their questions. She said that she would like to have the best long-term procedure performed. Dr. Cyndia Bent discussed the pros and cons of mechanical and bioprosthetic valves. At 64 years old,  Dr. Cyndia Bent thought a bioprosthetic valve would be the best option for her with very low risk of structural valve deterioration over the next 20 years. She also has a history of paroxysmal atrial fibrillation over the past year or so but has had infrequent episodes with the last one being in October 2020. She has been well controlled on Coreg and has prn Cardizem for rate control if needed. She has tolerated Eliquis well. Dr. Cyndia Bent would plan to ligate her left atrial appendage with a clip but I do not think a biatrial maze procedure is indicated in this patient. Pre operative carotid duplex US showed no significant internal carotid artery stenosis bilaterally. Patient agreed to proceed with surgery and underwent a  Aortic valve replacement and clipping of LA appendage on 07/02/2019.   Brief Hospital Course:   The patient was extubated the evening of surgery without difficulty. He remained afebrile and hemodynamically stable. Gordy Councilman, a line, chest tubes, and foley were removed early in the post operative course. Coreg was started once hypotension resolved. She was volume over loaded and diuresed. She had expected ABL anemia. She did not require a post op transfusion. Last H and H was 8.7 and 27.7. She was weaned off the insulin drip.  The patient's glucose remained well controlled.The patient's HGA1C pre op was 7.1. She is likely a new diabetic and will need follow up with her medical doctor after discharge. The patient was felt surgically stable for transfer from the ICU to PCTU for further convalescence on 04/24.  She continues to progress with cardiac rehab. She was ambulating on a few liters of oxygen but was later weaned to room air. She has been tolerating a diet and has had a bowel movement. She was more tachycardic and BP was improved so she was started on Coreg on 04/23. Epicardial pacing wires were removed on 04/25. She went into a fib with RVR on 04/28. She was given an Amiodarone bolus and put on a drip. Because of labile BP, she was not able to be started on Cardizem etc. Ultimately, she was given several boluses of IV Amiodarone, Digoxin, and one dose of IV Lopressor (with Benadryl because it causes a rash) but she still remained with uncontrolled rate. Cardiology was consulted on 04/29. DCCV was arranged and done on 07/10/2019. Chest tube sutures will be removed in the office after discharge. The patient is felt surgically stable for discharge today.   Latest Vital Signs: Blood pressure 105/64, pulse 83, temperature 98.1 F (36.7 C), temperature source Oral, resp. rate (!) 24, height 5\' 2"  (1.575 m), weight 62.6 kg, SpO2 100 %.  Physical Exam:  General appearance: alert, cooperative and no distress Heart: regular rate and rhythm, S1, S2 normal, no murmur, click, rub or gallop Lungs: clear to auscultation bilaterally Abdomen: soft, non-tender; bowel sounds normal; no masses,  no organomegaly Extremities: extremities normal, atraumatic, no cyanosis or edema Wound: clean and dry  Discharge Condition:Stable and discharged  to home.  Recent laboratory studies:  Lab Results  Component Value Date   WBC 8.5 07/06/2019   HGB 8.7 (L) 07/06/2019   HCT 27.7 (L) 07/06/2019   MCV 92.0 07/06/2019   PLT 227 07/06/2019   Lab Results  Component Value Date   NA 134 (L) 07/11/2019   K 3.8 07/11/2019   CL 97 (L) 07/11/2019   CO2 27 07/11/2019   CREATININE 0.95 07/11/2019   GLUCOSE 135 (H) 07/11/2019      Diagnostic Studies: DG Chest 1 View  Result Date: 07/07/2019 CLINICAL DATA:  Post left  thoracentesis. EXAM: CHEST  1 VIEW COMPARISON:  07/06/2019 FINDINGS: Lungs are hypoinflated and demonstrate interval improvement in left pleural effusion post thoracentesis with suggestion of small amount of residual left pleural fluid/basilar atelectasis. No evidence of left-sided pneumothorax. Interval worsening of moderate size right pleural effusion with associated atelectasis. Linear atelectasis over the right perihilar region. Mild stable cardiomegaly. Remainder of the exam is unchanged. IMPRESSION: 1. Improvement in left pleural effusion post thoracentesis with small residual left effusion/basilar atelectasis. No pneumothorax. 2. Worsening moderate size right pleural effusion likely with associated basilar atelectasis. Linear atelectasis right perihilar region. Stable cardiomegaly. Electronically Signed   By: Marin Olp M.D.   On: 07/07/2019 13:39   DG Chest 2 View  Result Date: 07/06/2019 CLINICAL DATA:  Pleural effusion EXAM: CHEST - 2 VIEW COMPARISON:  July 04, 2019 FINDINGS: Bilateral pleural effusions, fairly small, are stable. There is bibasilar atelectatic change. A degree of airspace consolidation superimposed in the bases is suspected. Heart is mildly enlarged with pulmonary vascularity normal. Patient is status post aortic valve replacement. There is a left atrial appendage clamp. No adenopathy. No bone lesions. IMPRESSION: Stable pleural effusions bilaterally with bibasilar atelectasis and patchy airspace consolidation. Stable cardiac prominence. Postoperative changes noted. Electronically Signed   By: [name] III M.D.   On: 07/06/2019 08:24   DG Chest 2 View  Result Date: 06/30/2019 CLINICAL DATA:  Preadmit AVR EXAM: CHEST - 2 VIEW COMPARISON:  05/14/2019 FINDINGS: The heart size and mediastinal contours are within normal limits. Both lungs are clear. The visualized skeletal structures are unremarkable. IMPRESSION: No active cardiopulmonary disease. Electronically Signed    By: Donavan Foil M.D.   On: 06/30/2019 20:18   DG Chest Port 1 View  Result Date: 07/04/2019 CLINICAL DATA:  Status post aortic valve replacement EXAM: PORTABLE CHEST 1 VIEW COMPARISON:  July 03, 2019 FINDINGS: The PA catheter is been removed. Right IJ remains. No pneumothorax. Small effusions with underlying opacities, likely atelectasis identified. Stable cardiomegaly. The hila and mediastinum are unchanged. No other acute interval changes. IMPRESSION: 1. Support apparatus as above. 2. Small effusions with underlying atelectasis. Electronically Signed   By: Dorise Bullion III M.D   On: 07/04/2019 12:02   DG Chest Port 1 View  Result Date: 07/03/2019 CLINICAL DATA:  64 year old female status post aortic valve replacement, clipping of left atrial appendage. Postoperative day 1. EXAM: PORTABLE CHEST 1 VIEW COMPARISON:  Portable chest 07/02/2019. FINDINGS: Portable AP upright view at 0618 hours. Extubated. Enteric tube removed. Otherwise stable lines and tubes. Stable Swan-Ganz catheter tip at the main pulmonary outflow. Mildly lower lung volumes. Stable cardiac size and mediastinal contours. Postoperative changes to the heart. No pneumothorax or pulmonary edema. Mild bibasilar opacity most resembles atelectasis. Negative visible bowel gas pattern. IMPRESSION: 1. Extubated and enteric tube removed. Otherwise stable lines and tubes. 2. Lower lung volumes with mild basilar atelectasis. Electronically Signed  By: Genevie Ann M.D.   On: 07/03/2019 08:50   DG Chest Port 1 View  Result Date: 07/02/2019 CLINICAL DATA:  S/P AVR (aortic valve replacement) EXAM: PORTABLE CHEST 1 VIEW COMPARISON:  06/30/2019 FINDINGS: Since the previous exam, cardiac surgery has been performed. Standard lines and tubes are in place, including: An endotracheal tube with its tip projecting 2.5 cm above the Carina, a nasal/orogastric tube passing below the diaphragm well into the stomach, a right internal jugular Swan-Ganz catheter, tip  projecting in the main pulmonary artery, a mediastinal tube and a left inferior me thorax chest tube. The cardiac silhouette is normal in size. No mediastinal widening. No mediastinal or hilar masses. Lungs demonstrate prominent interstitial markings, without convincing pulmonary edema. No visualized pleural effusion and no pneumothorax. IMPRESSION: 1. Status post cardiac surgery with aortic valve replacement. 2. No acute findings or evidence of an operative complication. 3. Support apparatus is well positioned. Electronically Signed   By: Lajean Manes M.D.   On: 07/02/2019 12:35   ECHO INTRAOPERATIVE TEE  Result Date: 07/07/2019  *INTRAOPERATIVE TRANSESOPHAGEAL REPORT *  Patient Name:   Lindsay Guerrero Date of Exam: 07/02/2019 Medical Rec #:  [id number]       Height:       62.0 in Accession #:    [id number]      Weight:       140.7 lb Date of Birth:  10-03-1955       BSA:          1.65 m Patient Age:    62 years        BP:           121/60 mmHg Patient Gender: F               HR:           69 bpm. Exam Location:  Inpatient Transesophogeal exam was perform intraoperatively during surgical procedure. Patient was closely monitored under general anesthesia during the entirety of examination. Indications:     Aortic valve stenosis Sonographer:     Vikki Ports Turrentine Performing Phys: Marcus Hook Diagnosing Phys: Suella Broad MD Complications: No known complications during this procedure. POST-OP IMPRESSIONS - Left Ventricle: The left ventricle is unchanged from pre-bypass. - Aorta: The aorta appears unchanged from pre-bypass. - Left Atrial Appendage: A Pro 2 Clip was placed. - Aortic Valve: No stenosis present. A bioprosthetic valve was placed, leaflets are not freely mobile Manufactured by; Resilia Size; 50mm. There is no regurgitation. The gradient recorded across the prosthetic valve is within the expected range. No perivalvular leak noted. - Mitral Valve: The mitral valve appears unchanged from pre-bypass.  - Tricuspid Valve: The tricuspid valve appears unchanged from pre-bypass. - Pericardium: The pericardium appears unchanged from pre-bypass. PRE-OP FINDINGS  Left Ventricle: The left ventricle has normal systolic function, with an ejection fraction of 60-65%. The cavity size was normal. There is no increase in left ventricular wall thickness. No evidence of left ventricular regional wall motion abnormalities. Right Ventricle: The right ventricle has normal systolic function. The cavity was normal. There is no increase in right ventricular wall thickness. Left Atrium: Left atrial size was normal in size. The left atrial appendage is well visualized and there is no evidence of thrombus present. Left atrial appendage velocity is normal at greater than 40 cm/s. Right Atrium: Right atrial size was normal in size. Interatrial Septum: No atrial level shunt detected by color flow Doppler. Pericardium: There is no evidence  of pericardial effusion. There is no evidence of cardiac tamponade. Mitral Valve: The mitral valve is normal in structure. No thickening of the mitral valve leaflet. No calcification of the mitral valve leaflet. Mitral valve regurgitation is trivial by color flow Doppler. The MR jet is centrally-directed. There is no evidence of mitral valve vegetation. Tricuspid Valve: The tricuspid valve was normal in structure. Tricuspid valve regurgitation is mild by color flow Doppler. The jet is directed centrally. The tricuspid valve is mildly thickened. There is no evidence of tricuspid valve vegetation. Aortic Valve: The aortic valve is tricuspid There is moderate thickening of the aortic valve and There is severe calcifcation of the aortic valve Aortic valve regurgitation is trivial by color flow Doppler. The jet is centrally-directed. There is moderate-severe stenosis of the aortic valve, with a calculated valve area of 0.98 cm. There is Moderate aortic annular calcification noted. There is no evidence of a  vegetation on the aortic valve. Pulmonic Valve: The pulmonic valve was normal in structure. Pulmonic valve regurgitation is not visualized by color flow Doppler. Aorta: The aortic root, ascending aorta and aortic arch are normal in size and structure. There is evidence of layered immobile plaque in the descending aorta; Grade I, measuring 1-60mm in size. Pulmonary Artery: The pulmonary artery is of normal size. +--------------+--------++ LEFT VENTRICLE         +--------------+--------++ PLAX 2D                +--------------+--------++ LVIDd:        4.60 cm  +--------------+--------++ LVIDs:        3.00 cm  +--------------+--------++ LVOT diam:    1.90 cm  +--------------+--------++ LV SV:        62 ml    +--------------+--------++ LV SV Index:  36.99    +--------------+--------++ LVOT Area:    2.84 cm +--------------+--------++                        +--------------+--------++ +------------------+------------++ AORTIC VALVE                   +------------------+------------++ AV Area (Vmax):   0.96 cm     +------------------+------------++ AV Area (Vmean):  0.84 cm     +------------------+------------++ AV Area (VTI):    0.98 cm     +------------------+------------++ AV Vmax:          237.50 cm/s  +------------------+------------++ AV Vmean:         176.900 cm/s +------------------+------------++ AV VTI:           0.556 m      +------------------+------------++ AV Peak Grad:     22.6 mmHg    +------------------+------------++ AV Mean Grad:     16.5 mmHg    +------------------+------------++ LVOT Vmax:        80.70 cm/s   +------------------+------------++ LVOT Vmean:       52.300 cm/s  +------------------+------------++ LVOT VTI:         0.193 m      +------------------+------------++ LVOT/AV VTI ratio:0.35         +------------------+------------++  +------------+-------++ AORTA               +------------+-------++ Ao  Asc diam:2.80 cm +------------+-------++  +--------------+-------+ SHUNTS                +--------------+-------+ Systemic VTI: 0.19 m  +--------------+-------+ Systemic Diam:1.90 cm +--------------+-------+  Suella Broad MD Electronically signed by Suella Broad MD Signature Date/Time: 07/07/2019/9:13:16 AM  Final    VAS US DOPPLER PRE CABG  Result Date: 06/30/2019 PREOPERATIVE VASCULAR EVALUATION  Indications:  Pre-CABG. Risk Factors: Hypertension, hyperlipidemia. Performing Technologist: June Leap Rdms, Rvt  Examination Guidelines: A complete evaluation includes B-mode imaging, spectral Doppler, color Doppler, and power Doppler as needed of all accessible portions of each vessel. Bilateral testing is considered an integral part of a complete examination. Limited examinations for reoccurring indications may be performed as noted.  Right Carotid Findings: +----------+--------+--------+--------+------------+--------+           PSV cm/sEDV cm/sStenosisDescribe    Comments +----------+--------+--------+--------+------------+--------+ CCA Prox  96      21                                   +----------+--------+--------+--------+------------+--------+ CCA Distal54      17                                   +----------+--------+--------+--------+------------+--------+ ICA Prox  65      25      1-39%   heterogenous         +----------+--------+--------+--------+------------+--------+ ICA Distal88      22                                   +----------+--------+--------+--------+------------+--------+ ECA       111     21                                   +----------+--------+--------+--------+------------+--------+ Portions of this table do not appear on this page. +----------+--------+-------+----------------+------------+           PSV cm/sEDV cmsDescribe        Arm Pressure +----------+--------+-------+----------------+------------+ Subclavian78              Multiphasic, [id number]          +----------+--------+-------+----------------+------------+ +---------+--------+--+--------+--+---------+ VertebralPSV cm/s57EDV cm/s19Antegrade +---------+--------+--+--------+--+---------+ Left Carotid Findings: +----------+--------+--------+--------+------------+--------+           PSV cm/sEDV cm/sStenosisDescribe    Comments +----------+--------+--------+--------+------------+--------+ CCA Prox  109     28                                   +----------+--------+--------+--------+------------+--------+ CCA Distal93      24                                   +----------+--------+--------+--------+------------+--------+ ICA Prox  66      24      1-39%   heterogenous         +----------+--------+--------+--------+------------+--------+ ICA Distal114     36                                   +----------+--------+--------+--------+------------+--------+ ECA       90      12                                   +----------+--------+--------+--------+------------+--------+ +----------+--------+--------+----------------+------------+  SubclavianPSV cm/sEDV cm/sDescribe        Arm Pressure +----------+--------+--------+----------------+------------+           125             Multiphasic, [id number]          +----------+--------+--------+----------------+------------+ +---------+--------+--+--------+--+---------+ VertebralPSV cm/s42EDV cm/s18Antegrade +---------+--------+--+--------+--+---------+  Right Doppler Findings: +--------+--------+-----+---------+--------+ Site    PressureIndexDoppler  Comments +--------+--------+-----+---------+--------+ [id number]          biphasic          +--------+--------+-----+---------+--------+ Radial               triphasic         +--------+--------+-----+---------+--------+ Ulnar                triphasic         +--------+--------+-----+---------+--------+  Left Doppler  Findings: +--------+--------+-----+---------+--------+ Site    PressureIndexDoppler  Comments +--------+--------+-----+---------+--------+ [id number]          biphasic          +--------+--------+-----+---------+--------+ Radial               triphasic         +--------+--------+-----+---------+--------+ Ulnar                biphasic          +--------+--------+-----+---------+--------+  Summary: Right Carotid: Velocities in the right ICA are consistent with a 1-39% stenosis. Left Carotid: Velocities in the left ICA are consistent with a 1-39% stenosis. Right Upper Extremity: Doppler waveforms decrease <50% with right radial compression. Doppler waveform obliterate with right ulnar compression. Left Upper Extremity: Doppler waveforms remain within normal limits with left radial compression. Doppler waveforms remain within normal limits with left ulnar compression.  Electronically signed by Harold Barban MD on 06/30/2019 at 5:01:37 PM.    Final    IR THORACENTESIS ASP PLEURAL SPACE W/IMG GUIDE  Result Date: 07/07/2019 INDICATION: Shortness of breath. Status post recent open heart surgery. Left-sided pleural effusion. Request for therapeutic left thoracentesis. EXAM: ULTRASOUND GUIDED LEFT THORACENTESIS MEDICATIONS: None. COMPLICATIONS: None immediate. PROCEDURE: An ultrasound guided thoracentesis was thoroughly discussed with the patient and questions answered. The benefits, risks, alternatives and complications were also discussed. The patient understands and wishes to proceed with the procedure. Written consent was obtained. Ultrasound was performed to localize and mark an adequate pocket of fluid in the left chest. The area was then prepped and draped in the normal sterile fashion. 1% Lidocaine was used for local anesthesia. Under ultrasound guidance a 6 Fr Safe-T-Centesis catheter was introduced. Thoracentesis was performed. The catheter was removed and a dressing applied. FINDINGS: A  total of approximately 250 mL of clear, serosanguineous fluid was removed. IMPRESSION: Successful ultrasound guided left thoracentesis yielding 250 mL of pleural fluid. Read by: Ascencion Dike PA-C Electronically Signed   By: Aletta Edouard M.D.   On: 07/07/2019 13:25   Discharge Instructions    Amb Referral to Cardiac Rehabilitation   Complete by: As directed    Referring to [place] CRP 2   Diagnosis: Valve Replacement   Valve: Aortic   After initial evaluation and assessments completed: Virtual Based Care may be provided alone or in conjunction with Phase 2 Cardiac Rehab based on patient barriers.: Yes      Discharge Medications: Allergies as of 07/12/2019      Reactions   Lisinopril Cough   Iodine Rash   05/21/19 The pt states that she had a reaction years ago when eating shellfish and she has also developed a  rash on skin with iodine use. Theodosia Quay RN,BSN 05/21/19 12:36 PM      Metoprolol Rash      Medication List    STOP taking these medications   diltiazem 30 MG tablet Commonly known as: CARDIZEM   furosemide 20 MG tablet Commonly known as: LASIX   losartan 25 MG tablet Commonly known as: COZAAR     TAKE these medications   acetaminophen 500 MG tablet Commonly known as: TYLENOL Take 500-1,000 mg by mouth every 6 (six) hours as needed for headache (pain).   albuterol 108 (90 Base) MCG/ACT inhaler Commonly known as: VENTOLIN HFA Inhale 2 puffs into the lungs every 6 (six) hours as needed for wheezing or shortness of breath.   amiodarone 200 MG tablet Commonly known as: PACERONE Continue 400mg  (2 tabs) twice a day for 2 days, then take 200mg  (1 tab) twice a day for one week, and then take 1 tab (200mg ) daily until we see you in the clinic.   amphetamine-dextroamphetamine 20 MG tablet Commonly known as: ADDERALL Take 10-20 mg by mouth daily as needed (ADD and/or hypersomnolence).   aspirin 325 MG EC tablet Take 1 tablet (325 mg total) by mouth daily.     buPROPion 300 MG 24 hr tablet Commonly known as: WELLBUTRIN XL Take 300 mg by mouth every morning.   carvedilol 3.125 MG tablet Commonly known as: COREG Take 1 tablet (3.125 mg total) by mouth 2 (two) times daily with a meal. What changed:   medication strength  See the new instructions.   Eliquis 5 MG Tabs tablet Generic drug: apixaban TAKE 1 TABLET BY MOUTH  TWICE DAILY What changed: how much to take   gabapentin 600 MG tablet Commonly known as: NEURONTIN Take 300 mg by mouth at bedtime as needed for pain.   LORazepam 1 MG tablet Commonly known as: ATIVAN Take 1 mg by mouth at bedtime as needed for anxiety or sleep.   multivitamin-prenatal 27-0.8 MG Tabs tablet Take 1 tablet by mouth daily at 12 noon.   NAC PO Take 3 tablets by mouth daily.   ondansetron 8 MG tablet Commonly known as: ZOFRAN Take 4-8 mg by mouth every 8 (eight) hours as needed (nausea while traveling).   oxyCODONE 5 MG immediate release tablet Commonly known as: Oxy IR/ROXICODONE Take 1 tablet (5 mg total) by mouth every 6 (six) hours as needed for severe pain.   oxymetazoline 0.05 % nasal spray Commonly known as: AFRIN Place 1 spray into both nostrils daily as needed (allergies.).   potassium chloride SA 20 MEQ tablet Commonly known as: KLOR-CON Take 20 mEq by mouth daily as needed (when takes lasix).   pramipexole 1.5 MG tablet Commonly known as: MIRAPEX Take 0.375-1.5 tablets by mouth at bedtime as needed (restless legs).   VITAMIN A PO Take 1 capsule by mouth 3 (three) times a week.   Vitamin D (Ergocalciferol) 1.25 MG (50000 UNIT) Caps capsule Commonly known as: DRISDOL Take 50,000 Units by mouth every 7 (seven) days.      The patient has been discharged on:   1.Beta Blocker:  Yes [ x  ]                              No   [   ]  If No, reason:  2.Ace Inhibitor/ARB: Yes [   ]                                     No  [ x  ]                                      If No, reason:Labile BP  3.Statin:   Yes [   ]                  No  [x   ]                  If No, reason:No CAD  4.Shela Commons:  Yes  [  x ]                  No   [   ]                  If No, reason:  Follow Up Appointments: Follow-up Information    Gaye Pollack, MD. Go on 08/12/2019.   Specialty: Cardiothoracic Surgery Why: PA/LAT CXR to be taken (at [facility] which is in the same building as Dr. Vivi Martens office) on 06/02 at 11:30 am;Appointment time is at 12:00 pm Contact information: [address] [place] [address] [phone]        [facility] [place]. Go on 07/17/2019.   Specialty: Cardiothoracic Surgery Why: Appointment is with nurse only for chest tube suture removal. Appointment time is at 10:30 am Contact information: [place], [place]       Park Liter, MD. Go on 07/14/2019.   Specialty: Cardiology Why: Appointment time is at 10:20 am  Contact information: [place] [address] [phone]        Bonnita Nasuti, MD. Call.   Specialty: Internal Medicine Why: for a follow up appointment regarding further surveillance of HGA1C 7.1 Contact information: [address] [place] [address] [phone]           Signed: Alvester Chou 07/12/2019, 8:58 AM

## 2019-07-02 NOTE — Interval H&P Note (Signed)
History and Physical Interval Note:  07/02/2019 7:39 AM  Lindsay Guerrero  has presented today for surgery, with the diagnosis of AS AFIB.  The various methods of treatment have been discussed with the patient and family. After consideration of risks, benefits and other options for treatment, the patient has consented to  Procedure(s): AORTIC VALVE REPLACEMENT (AVR) (N/A) CLIPPING OF ATRIAL APPENDAGE (N/A) TRANSESOPHAGEAL ECHOCARDIOGRAM (TEE) (N/A) as a surgical intervention.  The patient's history has been reviewed, patient examined, no change in status, stable for surgery.  I have reviewed the patient's chart and labs.  Questions were answered to the patient's satisfaction.     Gaye Pollack

## 2019-07-02 NOTE — Progress Notes (Signed)
RT note- Placed back to full support, too sleepy, precedex off, will re attempt in 1 hour.

## 2019-07-02 NOTE — Transfer of Care (Signed)
Immediate Anesthesia Transfer of Care Note  Patient: Lindsay Guerrero  Procedure(s) Performed: AORTIC VALVE REPLACEMENT (AVR) USING INSPIRIS VALVE SIZE 21MM (N/A Chest) CLIPPING OF ATRIAL APPENDAGE USING PRO2 CLIP SIZE 40MM (N/A Chest) TRANSESOPHAGEAL ECHOCARDIOGRAM (TEE) (N/A )  Patient Location: ICU  Anesthesia Type:General  Level of Consciousness: Patient remains intubated per anesthesia plan  Airway & Oxygen Therapy: Patient remains intubated per anesthesia plan and Patient placed on Ventilator (see vital sign flow sheet for setting)  Post-op Assessment: Report given to RN and Post -op Vital signs reviewed and stable  Post vital signs: Reviewed and stable  Last Vitals:  HR 85, bP 104/61, placed on vent - sats 100 Vitals Value Taken Time  BP    Temp    Pulse 93 07/02/19 1157  Resp 12 07/02/19 1157  SpO2 99 % 07/02/19 1157  Vitals shown include unvalidated device data.  Last Pain:  Vitals:   07/02/19 0619  PainSc: 0-No pain      Patients Stated Pain Goal: 4 ([id number])  Complications: No apparent anesthesia complications

## 2019-07-02 NOTE — Anesthesia Procedure Notes (Addendum)
Central Venous Catheter Insertion Performed by: Belinda Block, MD, anesthesiologist Start/End4/22/2021 6:40 AM, 07/02/2019 6:50 AM Patient location: Pre-op. Preanesthetic checklist: patient identified, IV checked, site marked, risks and benefits discussed, surgical consent, monitors and equipment checked, pre-op evaluation, timeout performed and anesthesia consent Lidocaine 1% used for infiltration and patient sedated Hand hygiene performed  and maximum sterile barriers used  Catheter size: 8.5 Fr PA cath was placed.Sheath introducer Swan type:thermodilution Procedure performed using ultrasound guided technique. Ultrasound Notes:anatomy identified, needle tip was noted to be adjacent to the nerve/plexus identified, no ultrasound evidence of intravascular and/or intraneural injection and image(s) printed for medical record Attempts: 1 Following insertion, line sutured and dressing applied. Post procedure assessment: blood return through all ports, free fluid flow and no air  Patient tolerated the procedure well with no immediate complications.

## 2019-07-02 NOTE — Progress Notes (Signed)
Pre-extubation ABG: Results for Lindsay Guerrero, Lindsay Guerrero" (MRN [id number])    Ref. Range 07/02/2019 15:32  Sample type Unknown ARTERIAL  pH, Arterial Latest Ref Range: 7.350 - 7.450  7.367  pCO2 arterial Latest Ref Range: 32.0 - 48.0 mmHg 46.6  pO2, Arterial Latest Ref Range: 83.0 - 108.0 mmHg 124 (H)  TCO2 Latest Ref Range: 22 - 32 mmol/L 28  Acid-Base Excess Latest Ref Range: 0.0 - 2.0 mmol/L 1.0  Bicarbonate Latest Ref Range: 20.0 - 28.0 mmol/L 26.6  O2 Saturation Latest Units: % 99.0  Patient temperature Unknown 37.8 C  Collection site Unknown Radial   1 hour post-extubation ABG Results for Lindsay Guerrero, Lindsay Guerrero (MRN [id number])    Ref. Range 07/02/2019 16:54  Sample type Unknown ARTERIAL  pH, Arterial Latest Ref Range: 7.350 - 7.450  7.199 (LL)  pCO2 arterial Latest Ref Range: 32.0 - 48.0 mmHg 63.1 (H)  pO2, Arterial Latest Ref Range: 83.0 - 108.0 mmHg 185 (H)  TCO2 Latest Ref Range: 22 - 32 mmol/L 26  Acid-base deficit Latest Ref Range: 0.0 - 2.0 mmol/L 4.0 (H)  Bicarbonate Latest Ref Range: 20.0 - 28.0 mmol/L 24.6  O2 Saturation Latest Units: % 99.0  Patient temperature Unknown 37.1 C   **MD aware** 1 amp HCO3 and repeat ABG ordered. Bipap if ABG not improved  2 hours post-extubation: Results for Lindsay Guerrero, Lindsay Guerrero" (MRN [id number])    Ref. Range 07/02/2019 18:05  pH, Arterial Latest Ref Range: 7.350 - 7.450  7.258 (L)  pCO2 arterial Latest Ref Range: 32.0 - 48.0 mmHg 59.9 (H)  pO2, Arterial Latest Ref Range: 83.0 - 108.0 mmHg 201 (H)  TCO2 Latest Ref Range: 22 - 32 mmol/L 28  Acid-base deficit Latest Ref Range: 0.0 - 2.0 mmol/L 1.0  Bicarbonate Latest Ref Range: 20.0 - 28.0 mmol/L 26.7  O2 Saturation Latest Units: % 100.0  Patient temperature Unknown 37.1 C   **Bipap applied.** RN will continue to monitor patient closely.

## 2019-07-02 NOTE — Op Note (Signed)
CARDIOVASCULAR SURGERY OPERATIVE NOTE  07/02/2019 Lindsay Guerrero [id number]  Surgeon:  Gaye Pollack, MD  First Assistant: Lars Pinks,  PA-C   Preoperative Diagnosis:  Severe aortic stenosis   Postoperative Diagnosis:  Same   Procedure:  1. Median Sternotomy 2. Extracorporeal circulation 3.   Aortic valve replacement using a 21 mm Edwards INSPIRIS RESILIA pericardial valve. 4.   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 I have personally reviewed her 2D echocardiogram, cardiac catheterization, and CTA studies. Her echocardiogram shows a trileaflet aortic valve with moderate calcification and thickening as well as restricted leaflet mobility. The mean gradient is 36 mmHg with a valve area of 0.73 cm and a dimensionless index of 0.23 consistent with severe aortic stenosis. Left ventricular systolic function is normal with grade 2 diastolic dysfunction. Her cardiac catheterization shows essentially normal coronary arteries. I agree that aortic valve replacement is indicated in this patient for relief of her symptoms and to prevent progressive left ventricular deterioration. I discussed the options of open surgical aortic valve replacement and transcatheter aortic valve replacement. We discussed the pros and cons of both. I told her that I thought open surgical aortic valve replacement was the best option at her age and would likely give her the best long-term result. Her gated cardiac CTA shows anatomy suitable for transcatheter aortic valve replacement but I discussed the concerns about unknown long-term durability  and risk of perivalvular leak in her age group. I reviewed the cardiac catheterization and echocardiogram images with her and her daughter and answered all their questions. She said that she would like to have the best long-term procedure performed. I discussed the pros and cons of mechanical and bioprosthetic valves. At 64 years old I think a bioprosthetic valve would be the best option for her with very low risk of structural valve deterioration over the next 20 years. She also has a history of paroxysmal atrial fibrillation over the past year or so but has had infrequent episodes with the last one being in October 2020. She has been well controlled on Coreg and has prn Cardizem for rate control if needed. She has tolerated Eliquis well. I would plan to ligate her left atrial appendage with a clip but I do not think a biatrial maze procedure is indicated in this patient.  I discussed the operative procedure with the patient and her daughter including alternatives, benefits and risks; including but not limited to bleeding, blood transfusion, infection, stroke, myocardial infarction, graft failure, heart block requiring a permanent pacemaker, organ dysfunction, and death. Lindsay Guerrero understands and agrees to proceed.   Preparation:  The patient was seen in the preoperative holding area and the correct patient, correct operation were confirmed with the patient after reviewing the medical record and catheterization. The consent was signed by me. Preoperative antibiotics were given. A pulmonary arterial line and radial arterial line were placed by the anesthesia team. The patient was taken back to the operating room and positioned supine on the operating room table. After being placed under general endotracheal anesthesia by the anesthesia team a foley catheter was placed. The neck, chest, abdomen, and both legs were prepped with betadine soap  and solution and draped in the usual sterile manner. A surgical  time-out was taken and the correct patient and operative procedure were confirmed with the nursing and anesthesia staff.   Pre-bypass TEE:   Complete TEE assessment was performed by Dr. Smith Guerrero. This showed severe aortic stenosis with mean gradient of 40 mm Hg. Normal LV systolic function. Trivial MR    Post-bypass TEE:   Normal functioning prosthetic aortic valve with no perivalvular leak or regurgitation through the valve. Mean gradient 4 mm Hg. Left ventricular function preserved. Unchanged trivial mitral regurgitation.    Cardiopulmonary Bypass:  A median sternotomy was performed. The pericardium was opened in the midline. Right ventricular function appeared normal. The ascending aorta was of normal size and had no palpable plaque. There were no contraindications to aortic cannulation or cross-clamping. The patient was fully systemically heparinized and the ACT was maintained > 400 sec. The proximal aortic arch was cannulated with a 20 F aortic cannula for arterial inflow. Venous cannulation was performed via the right atrial appendage using a two-staged venous cannula. An antegrade cardioplegia/vent cannula was inserted into the mid-ascending aorta. A left ventricular vent was placed via the right superior pulmonary vein. A retrograde cardioplegia cannnula was placed into the coronary sinus via the right atrium. Aortic occlusion was performed with a single cross-clamp. Systemic cooling to 32 degrees Centigrade and topical cooling of the heart with iced saline were used. Hyperkalemic antegrade cold blood cardioplegia was used to induce diastolic arrest and then cold blood retrograde cardioplegia was given at about 20 minute intervals throughout the period of arrest to maintain myocardial temperature at or below 10 degrees centigrade. A temperature probe was inserted into the interventricular septum and an insulating pad was placed in the pericardium. Carbon dioxide was insufflated into the  pericardium at 5L/min throughout the procedure to minimize intracardiac air.  Ligation of left atrial appendage:   The base of the appendage was measured and a 40 mm Atricure Pro 2 clip was chosen. This was placed across the base of the LAA without difficulty.     Aortic Valve Replacement:   A transverse aortotomy was performed 1 cm above the take-off of the right coronary artery. The native valve was trileaflet with calcified leaflets and mild annular calcification. The ostia of the coronary arteries were in normal position and were not obstructed. The native valve leaflets were excised and the annulus was decalcified with rongeurs. Care was taken to remove all particulate debris. The left ventricle was directly inspected for debris and then irrigated with ice saline solution. The annulus was sized and a size 21 mm Edwards INSPIRIS RESILIA pericardial valve was chosen. The model number was 11500A and the serial number was [id number]. BSA was 1.65 so this was felt to be an appropriate sized valve. While the valve was being prepared 2-0 Ethibond pledgeted horizontal mattress sutures were placed around the annulus with the pledgets in a sub-annular position. The sutures were placed through the sewing ring and the valve lowered into place. The sutures were tied sequentially. The valve seated nicely and the coronary ostia were not obstructed. The prosthetic valve leaflets moved normally and there was no sub-valvular obstruction. The aortotomy was closed using 4-0 Prolene suture in 2 layers with felt strips to reinforce the closure.  Completion:  The patient was rewarmed to 37 degrees Centigrade. De-airing maneuvers were performed and the head placed in trendelenburg position. The crossclamp was removed with a time of 74 minutes. There was spontaneous return  of ventricular fibrillation and the patient was defibrillated to sinus rhythm.  The aortotomy was checked for hemostasis. Two temporary epicardial pacing  wires were placed on the right atrium and two on the right ventricle. The left ventricular vent and retrograde cardioplegia cannulas were removed. The patient was weaned from CPB without difficulty on no inotropes. CPB time was 94 minutes. Cardiac output was 4 LPM. Heparin was fully reversed with protamine and the aortic and venous cannulas removed. Hemostasis was achieved. Mediastinal drainage tubes were placed. The sternum was closed with double #6 stainless steel wires. The fascia was closed with continuous # 1 vicryl suture. The subcutaneous tissue was closed with 2-0 vicryl continuous suture. The skin was closed with 3-0 vicryl subcuticular suture. All sponge, needle, and instrument counts were reported correct at the end of the case. Dry sterile dressings were placed over the incisions and around the chest tubes which were connected to pleurevac suction. The patient was then transported to the surgical intensive care unit in stable condition.

## 2019-07-02 NOTE — Brief Op Note (Signed)
07/02/2019  10:36 AM  PATIENT:  Lindsay Guerrero  64 y.o. female  PRE-OPERATIVE DIAGNOSIS:  1. Severe aortic stenosis 2. Atrial fibrillation  POST-OPERATIVE DIAGNOSIS:  1. Severe aortic stenosis 2. Atrial fibrillation  PROCEDURE:  TRANSESOPHAGEAL ECHOCARDIOGRAM (TEE), AORTIC VALVE REPLACEMENT (AVR) ( USING INSPIRIS VALVE Model 11500A, Serial # [id number]  SIZE 21MM ) and CLIPPING OF ATRIAL APPENDAGE USING PRO2 CLIP SIZE 40MM   SURGEON:  Surgeon(s) and Role:    Bartle, Fernande Boyden, MD - Primary  PHYSICIAN ASSISTANT: Lars Pinks PA-C  ASSISTANTS: Daylene Posey RNFA  ANESTHESIA:   general  EBL:  Per anesthesia and perfusion record  DRAINS: Chest tubes placed in the mediastinal and pleural spaces   SPECIMEN:  Source of Specimen:  Native aortic valve leaflets  DISPOSITION OF SPECIMEN:  PATHOLOGY  COUNTS CORRECT:  YES  DICTATION: .Dragon Dictation  PLAN OF CARE: Admit to inpatient   PATIENT DISPOSITION:  ICU - intubated and hemodynamically stable.   Delay start of Pharmacological VTE agent (>24hrs) due to surgical blood loss or risk of bleeding: yes  BASELINE WEIGHT: 63.8 kg

## 2019-07-02 NOTE — Progress Notes (Signed)
  Echocardiogram Echocardiogram Transesophageal has been performed.  Lindsay Guerrero A Lindsay Guerrero 07/02/2019, 9:44 AM

## 2019-07-02 NOTE — Anesthesia Procedure Notes (Signed)
Procedure Name: Intubation Date/Time: 07/02/2019 7:37 AM Performed by: Wilburn Cornelia, CRNA Pre-anesthesia Checklist: Patient identified, Emergency Drugs available, Suction available, Patient being monitored and Timeout performed Patient Re-evaluated:Patient Re-evaluated prior to induction Oxygen Delivery Method: Circle system utilized Preoxygenation: Pre-oxygenation with 100% oxygen Induction Type: IV induction Ventilation: Mask ventilation without difficulty Laryngoscope Size: Mac and 3 Grade View: Grade II Tube type: Oral Tube size: 8.0 mm Number of attempts: 1 Airway Equipment and Method: Stylet Placement Confirmation: ETT inserted through vocal cords under direct vision,  positive ETCO2,  CO2 detector and breath sounds checked- equal and bilateral Secured at: 21 cm Tube secured with: Tape Dental Injury: Teeth and Oropharynx as per pre-operative assessment

## 2019-07-02 NOTE — Anesthesia Preprocedure Evaluation (Addendum)
Anesthesia Evaluation  Patient identified by MRN, date of birth, ID band Patient awake    Reviewed: Allergy & Precautions, NPO status , Patient's Chart, lab work & pertinent test results  Airway Mallampati: I  TM Distance: >3 FB Neck ROM: Full    Dental  (+) Teeth Intact, Dental Advisory Given   Pulmonary asthma , sleep apnea ,    breath sounds clear to auscultation       Cardiovascular hypertension, +CHF  + dysrhythmias Atrial Fibrillation + Valvular Problems/Murmurs AS and MR  Rhythm:Regular Rate:Normal + Systolic murmurs    Neuro/Psych PSYCHIATRIC DISORDERS Anxiety Depression negative neurological ROS     GI/Hepatic hiatal hernia, (+) Hepatitis -  Endo/Other  diabetes  Renal/GU negative Renal ROS     Musculoskeletal negative musculoskeletal ROS (+)   Abdominal Normal abdominal exam  (+)   Peds  Hematology negative hematology ROS (+)   Anesthesia Other Findings   Reproductive/Obstetrics                           Echo:  1. Left ventricular ejection fraction, by estimation, is 65 to 70%. The  left ventricle has normal function. The left ventrical has no regional  wall motion abnormalities. Left ventricular diastolic parameters are  consistent with Grade II diastolic  dysfunction (pseudonormalization).  2. Right ventricular systolic function is normal. The right ventricular  size is normal. There is normal pulmonary artery systolic pressure.  3. Left atrial size was mildly dilated. No left atrial/left atrial  appendage thrombus was detected.  4. The mitral valve is normal in structure and function. trivial mitral  valve regurgitation. No evidence of mitral stenosis.  5. Progression of AS noted. August 2020: peak/mean gradient39/24 mmHg,  AVA 0.66, DI 0.27. Now Peak/mean gradient - 60/36 mmHg, AVA 0.73, DI -  0.23. The aortic valve is grossly normal. Aortic valve regurgitation is   mild. Moderate to severe aortic valve  stenosis.  6. The inferior vena cava is normal in size with greater than 50%  respiratory variability, suggesting right atrial pressure of 3 mmHg.   Anesthesia Physical Anesthesia Plan  ASA: IV  Anesthesia Plan: General   Post-op Pain Management:    Induction: Intravenous  PONV Risk Score and Plan: 2 and Ondansetron  Airway Management Planned: Oral ETT  Additional Equipment: Arterial line, CVP, PA Cath, TEE and Ultrasound Guidance Line Placement  Intra-op Plan:   Post-operative Plan: Post-operative intubation/ventilation  Informed Consent: I have reviewed the patients History and Physical, chart, labs and discussed the procedure including the risks, benefits and alternatives for the proposed anesthesia with the patient or authorized representative who has indicated his/her understanding and acceptance.       Plan Discussed with: CRNA  Anesthesia Plan Comments:        Anesthesia Quick Evaluation

## 2019-07-02 NOTE — Anesthesia Procedure Notes (Signed)
Arterial Line Insertion Start/End4/22/2021 6:45 AM, 07/02/2019 6:50 AM Performed by: Wilburn Cornelia, CRNA, CRNA  Preanesthetic checklist: patient identified, IV checked, site marked, risks and benefits discussed, surgical consent, monitors and equipment checked, pre-op evaluation, timeout performed and anesthesia consent Lidocaine 1% used for infiltration and patient sedated Left, radial was placed Catheter size: 20 G Hand hygiene performed  and maximum sterile barriers used  Allen's test indicative of satisfactory collateral circulation Attempts: 1 Procedure performed without using ultrasound guided technique. Following insertion, Biopatch and dressing applied. Post procedure assessment: normal  Patient tolerated the procedure well with no immediate complications.

## 2019-07-03 ENCOUNTER — Inpatient Hospital Stay (HOSPITAL_COMMUNITY): Payer: Commercial Managed Care - PPO

## 2019-07-03 LAB — CBC
HCT: 29.8 % — ABNORMAL LOW (ref 36.0–46.0)
HCT: 28.7 % — ABNORMAL LOW (ref 36.0–46.0)
Hemoglobin: 9.5 g/dL — ABNORMAL LOW (ref 12.0–15.0)
Hemoglobin: 9.1 g/dL — ABNORMAL LOW (ref 12.0–15.0)
MCH: 28.8 pg (ref 26.0–34.0)
MCH: 29.4 pg (ref 26.0–34.0)
MCHC: 31.9 g/dL (ref 30.0–36.0)
MCHC: 31.7 g/dL (ref 30.0–36.0)
MCV: 90.3 fL (ref 80.0–100.0)
MCV: 92.6 fL (ref 80.0–100.0)
Platelets: 157 10*3/uL (ref 150–400)
Platelets: 142 10*3/uL — ABNORMAL LOW (ref 150–400)
RBC: 3.3 MIL/uL — ABNORMAL LOW (ref 3.87–5.11)
RBC: 3.1 MIL/uL — ABNORMAL LOW (ref 3.87–5.11)
RDW: 14.5 % (ref 11.5–15.5)
RDW: 14.6 % (ref 11.5–15.5)
WBC: 10.3 10*3/uL (ref 4.0–10.5)
WBC: 11 10*3/uL — ABNORMAL HIGH (ref 4.0–10.5)
nRBC: 0 % (ref 0.0–0.2)
nRBC: 0 % (ref 0.0–0.2)

## 2019-07-03 LAB — POCT I-STAT, CHEM 8
BUN: 18 mg/dL (ref 8–23)
BUN: 15 mg/dL (ref 8–23)
BUN: 16 mg/dL (ref 8–23)
BUN: 17 mg/dL (ref 8–23)
BUN: 17 mg/dL (ref 8–23)
Calcium, Ion: 1.21 mmol/L (ref 1.15–1.40)
Calcium, Ion: 0.96 mmol/L — ABNORMAL LOW (ref 1.15–1.40)
Calcium, Ion: 1.16 mmol/L (ref 1.15–1.40)
Calcium, Ion: 1.01 mmol/L — ABNORMAL LOW (ref 1.15–1.40)
Calcium, Ion: 0.96 mmol/L — ABNORMAL LOW (ref 1.15–1.40)
Chloride: 99 mmol/L (ref 98–111)
Chloride: 97 mmol/L — ABNORMAL LOW (ref 98–111)
Chloride: 99 mmol/L (ref 98–111)
Chloride: 97 mmol/L — ABNORMAL LOW (ref 98–111)
Chloride: 100 mmol/L (ref 98–111)
Creatinine, Ser: 0.5 mg/dL (ref 0.44–1.00)
Creatinine, Ser: 0.4 mg/dL — ABNORMAL LOW (ref 0.44–1.00)
Creatinine, Ser: 0.5 mg/dL (ref 0.44–1.00)
Creatinine, Ser: 0.4 mg/dL — ABNORMAL LOW (ref 0.44–1.00)
Creatinine, Ser: 0.5 mg/dL (ref 0.44–1.00)
Glucose, Bld: 145 mg/dL — ABNORMAL HIGH (ref 70–99)
Glucose, Bld: 128 mg/dL — ABNORMAL HIGH (ref 70–99)
Glucose, Bld: 98 mg/dL (ref 70–99)
Glucose, Bld: 84 mg/dL (ref 70–99)
Glucose, Bld: 116 mg/dL — ABNORMAL HIGH (ref 70–99)
HCT: 32 % — ABNORMAL LOW (ref 36.0–46.0)
HCT: 26 % — ABNORMAL LOW (ref 36.0–46.0)
HCT: 28 % — ABNORMAL LOW (ref 36.0–46.0)
HCT: 23 % — ABNORMAL LOW (ref 36.0–46.0)
HCT: 26 % — ABNORMAL LOW (ref 36.0–46.0)
Hemoglobin: 10.9 g/dL — ABNORMAL LOW (ref 12.0–15.0)
Hemoglobin: 8.8 g/dL — ABNORMAL LOW (ref 12.0–15.0)
Hemoglobin: 9.5 g/dL — ABNORMAL LOW (ref 12.0–15.0)
Hemoglobin: 7.8 g/dL — ABNORMAL LOW (ref 12.0–15.0)
Hemoglobin: 8.8 g/dL — ABNORMAL LOW (ref 12.0–15.0)
Potassium: 3.6 mmol/L (ref 3.5–5.1)
Potassium: 3.4 mmol/L — ABNORMAL LOW (ref 3.5–5.1)
Potassium: 3.5 mmol/L (ref 3.5–5.1)
Potassium: 3.8 mmol/L (ref 3.5–5.1)
Potassium: 4 mmol/L (ref 3.5–5.1)
Sodium: 137 mmol/L (ref 135–145)
Sodium: 137 mmol/L (ref 135–145)
Sodium: 140 mmol/L (ref 135–145)
Sodium: 137 mmol/L (ref 135–145)
Sodium: 139 mmol/L (ref 135–145)
TCO2: 28 mmol/L (ref 22–32)
TCO2: 26 mmol/L (ref 22–32)
TCO2: 30 mmol/L (ref 22–32)
TCO2: 28 mmol/L (ref 22–32)
TCO2: 27 mmol/L (ref 22–32)

## 2019-07-03 LAB — GLUCOSE, CAPILLARY
Glucose-Capillary: 111 mg/dL — ABNORMAL HIGH (ref 70–99)
Glucose-Capillary: 114 mg/dL — ABNORMAL HIGH (ref 70–99)
Glucose-Capillary: 115 mg/dL — ABNORMAL HIGH (ref 70–99)
Glucose-Capillary: 119 mg/dL — ABNORMAL HIGH (ref 70–99)
Glucose-Capillary: 120 mg/dL — ABNORMAL HIGH (ref 70–99)
Glucose-Capillary: 126 mg/dL — ABNORMAL HIGH (ref 70–99)
Glucose-Capillary: 133 mg/dL — ABNORMAL HIGH (ref 70–99)
Glucose-Capillary: 141 mg/dL — ABNORMAL HIGH (ref 70–99)
Glucose-Capillary: 152 mg/dL — ABNORMAL HIGH (ref 70–99)

## 2019-07-03 LAB — BASIC METABOLIC PANEL
Anion gap: 8 (ref 5–15)
Anion gap: 6 (ref 5–15)
BUN: 12 mg/dL (ref 8–23)
BUN: 10 mg/dL (ref 8–23)
CO2: 23 mmol/L (ref 22–32)
CO2: 28 mmol/L (ref 22–32)
Calcium: 7.2 mg/dL — ABNORMAL LOW (ref 8.9–10.3)
Calcium: 7.2 mg/dL — ABNORMAL LOW (ref 8.9–10.3)
Chloride: 108 mmol/L (ref 98–111)
Chloride: 105 mmol/L (ref 98–111)
Creatinine, Ser: 0.61 mg/dL (ref 0.44–1.00)
Creatinine, Ser: 0.65 mg/dL (ref 0.44–1.00)
GFR calc Af Amer: >60 mL/min (ref >60)
GFR calc Af Amer: >60 mL/min (ref >60)
GFR calc non Af Amer: >60 mL/min (ref >60)
GFR calc non Af Amer: >60 mL/min (ref >60)
Glucose, Bld: 115 mg/dL — ABNORMAL HIGH (ref 70–99)
Glucose, Bld: 134 mg/dL — ABNORMAL HIGH (ref 70–99)
Potassium: 4.4 mmol/L (ref 3.5–5.1)
Potassium: 3.7 mmol/L (ref 3.5–5.1)
Sodium: 139 mmol/L (ref 135–145)
Sodium: 139 mmol/L (ref 135–145)

## 2019-07-03 LAB — POCT I-STAT 7, (LYTES, BLD GAS, ICA,H+H)
Acid-Base Excess: 0 mmol/L (ref 0.0–2.0)
Acid-Base Excess: 1 mmol/L (ref 0.0–2.0)
Acid-Base Excess: 1 mmol/L (ref 0.0–2.0)
Acid-Base Excess: 2 mmol/L (ref 0.0–2.0)
Acid-Base Excess: 2 mmol/L (ref 0.0–2.0)
Acid-Base Excess: 2 mmol/L (ref 0.0–2.0)
Acid-Base Excess: 6 mmol/L — ABNORMAL HIGH (ref 0.0–2.0)
Bicarbonate: 24.9 mmol/L (ref 20.0–28.0)
Bicarbonate: 26.5 mmol/L (ref 20.0–28.0)
Bicarbonate: 26.6 mmol/L (ref 20.0–28.0)
Bicarbonate: 27.1 mmol/L (ref 20.0–28.0)
Bicarbonate: 27.3 mmol/L (ref 20.0–28.0)
Bicarbonate: 28.3 mmol/L — ABNORMAL HIGH (ref 20.0–28.0)
Bicarbonate: 29.6 mmol/L — ABNORMAL HIGH (ref 20.0–28.0)
Calcium, Ion: 0.89 mmol/L — CL (ref 1.15–1.40)
Calcium, Ion: 1 mmol/L — ABNORMAL LOW (ref 1.15–1.40)
Calcium, Ion: 1.04 mmol/L — ABNORMAL LOW (ref 1.15–1.40)
Calcium, Ion: 1.07 mmol/L — ABNORMAL LOW (ref 1.15–1.40)
Calcium, Ion: 1.07 mmol/L — ABNORMAL LOW (ref 1.15–1.40)
Calcium, Ion: 1.08 mmol/L — ABNORMAL LOW (ref 1.15–1.40)
Calcium, Ion: 1.2 mmol/L (ref 1.15–1.40)
HCT: 20 % — ABNORMAL LOW (ref 36.0–46.0)
HCT: 23 % — ABNORMAL LOW (ref 36.0–46.0)
HCT: 23 % — ABNORMAL LOW (ref 36.0–46.0)
HCT: 28 % — ABNORMAL LOW (ref 36.0–46.0)
HCT: 28 % — ABNORMAL LOW (ref 36.0–46.0)
HCT: 29 % — ABNORMAL LOW (ref 36.0–46.0)
HCT: 30 % — ABNORMAL LOW (ref 36.0–46.0)
Hemoglobin: 10.2 g/dL — ABNORMAL LOW (ref 12.0–15.0)
Hemoglobin: 6.8 g/dL — CL (ref 12.0–15.0)
Hemoglobin: 7.8 g/dL — ABNORMAL LOW (ref 12.0–15.0)
Hemoglobin: 7.8 g/dL — ABNORMAL LOW (ref 12.0–15.0)
Hemoglobin: 9.5 g/dL — ABNORMAL LOW (ref 12.0–15.0)
Hemoglobin: 9.5 g/dL — ABNORMAL LOW (ref 12.0–15.0)
Hemoglobin: 9.9 g/dL — ABNORMAL LOW (ref 12.0–15.0)
O2 Saturation: 100 %
O2 Saturation: 100 %
O2 Saturation: 100 %
O2 Saturation: 100 %
O2 Saturation: 99 %
O2 Saturation: 99 %
O2 Saturation: 99 %
Patient temperature: 36.9
Patient temperature: 36.9
Patient temperature: 36.9
Potassium: 3.6 mmol/L (ref 3.5–5.1)
Potassium: 3.7 mmol/L (ref 3.5–5.1)
Potassium: 3.8 mmol/L (ref 3.5–5.1)
Potassium: 3.9 mmol/L (ref 3.5–5.1)
Potassium: 4.1 mmol/L (ref 3.5–5.1)
Potassium: 4.2 mmol/L (ref 3.5–5.1)
Potassium: 4.6 mmol/L (ref 3.5–5.1)
Sodium: 137 mmol/L (ref 135–145)
Sodium: 138 mmol/L (ref 135–145)
Sodium: 139 mmol/L (ref 135–145)
Sodium: 139 mmol/L (ref 135–145)
Sodium: 139 mmol/L (ref 135–145)
Sodium: 140 mmol/L (ref 135–145)
Sodium: 141 mmol/L (ref 135–145)
TCO2: 26 mmol/L (ref 22–32)
TCO2: 28 mmol/L (ref 22–32)
TCO2: 28 mmol/L (ref 22–32)
TCO2: 28 mmol/L (ref 22–32)
TCO2: 29 mmol/L (ref 22–32)
TCO2: 30 mmol/L (ref 22–32)
TCO2: 31 mmol/L (ref 22–32)
pCO2 arterial: 35.8 mmHg (ref 32.0–48.0)
pCO2 arterial: 40.1 mmHg (ref 32.0–48.0)
pCO2 arterial: 42.5 mmHg (ref 32.0–48.0)
pCO2 arterial: 45.3 mmHg (ref 32.0–48.0)
pCO2 arterial: 46.8 mmHg (ref 32.0–48.0)
pCO2 arterial: 46.9 mmHg (ref 32.0–48.0)
pCO2 arterial: 55.5 mmHg — ABNORMAL HIGH (ref 32.0–48.0)
pH, Arterial: 7.316 — ABNORMAL LOW (ref 7.350–7.450)
pH, Arterial: 7.362 (ref 7.350–7.450)
pH, Arterial: 7.374 (ref 7.350–7.450)
pH, Arterial: 7.376 (ref 7.350–7.450)
pH, Arterial: 7.385 (ref 7.350–7.450)
pH, Arterial: 7.428 (ref 7.350–7.450)
pH, Arterial: 7.525 — ABNORMAL HIGH (ref 7.350–7.450)
pO2, Arterial: 135 mmHg — ABNORMAL HIGH (ref 83.0–108.0)
pO2, Arterial: 139 mmHg — ABNORMAL HIGH (ref 83.0–108.0)
pO2, Arterial: 143 mmHg — ABNORMAL HIGH (ref 83.0–108.0)
pO2, Arterial: 314 mmHg — ABNORMAL HIGH (ref 83.0–108.0)
pO2, Arterial: 322 mmHg — ABNORMAL HIGH (ref 83.0–108.0)
pO2, Arterial: 406 mmHg — ABNORMAL HIGH (ref 83.0–108.0)
pO2, Arterial: 441 mmHg — ABNORMAL HIGH (ref 83.0–108.0)

## 2019-07-03 LAB — SURGICAL PATHOLOGY

## 2019-07-03 LAB — MAGNESIUM
Magnesium: 2.6 mg/dL — ABNORMAL HIGH (ref 1.7–2.4)
Magnesium: 2.4 mg/dL (ref 1.7–2.4)

## 2019-07-03 MED ORDER — INSULIN ASPART 100 UNIT/ML ~~LOC~~ SOLN
0.0000 [IU] | SUBCUTANEOUS | Status: DC
  Administered 2019-07-03 (×4): 2 [IU] via SUBCUTANEOUS

## 2019-07-03 MED ORDER — PRAMIPEXOLE DIHYDROCHLORIDE 0.25 MG PO TABS
0.5000 mg | ORAL_TABLET | Freq: Every evening | ORAL | Status: DC | PRN
  Filled 2019-07-03: qty 2

## 2019-07-03 MED ORDER — POTASSIUM CHLORIDE CRYS ER 20 MEQ PO TBCR
20.0000 meq | EXTENDED_RELEASE_TABLET | Freq: Two times a day (BID) | ORAL | Status: AC
  Administered 2019-07-03 (×2): 20 meq via ORAL
  Filled 2019-07-03 (×2): qty 1

## 2019-07-03 MED ORDER — INSULIN ASPART 100 UNIT/ML ~~LOC~~ SOLN: 0.0000 [IU] | SUBCUTANEOUS | Status: DC

## 2019-07-03 MED ORDER — FUROSEMIDE 10 MG/ML IJ SOLN
40.0000 mg | Freq: Once | INTRAMUSCULAR | Status: AC
  Administered 2019-07-03: 08:00:00 40 mg via INTRAVENOUS
  Filled 2019-07-03: qty 4

## 2019-07-03 MED ORDER — ENOXAPARIN SODIUM 40 MG/0.4ML ~~LOC~~ SOLN
40.0000 mg | Freq: Every day | SUBCUTANEOUS | Status: DC
  Administered 2019-07-03: 40 mg via SUBCUTANEOUS
  Filled 2019-07-03: qty 0.4

## 2019-07-03 MED FILL — Thrombin (Recombinant) For Soln 20000 Unit: CUTANEOUS | Qty: 1 | Status: AC

## 2019-07-03 NOTE — Progress Notes (Addendum)
1 Day Post-Op Procedure(s) (LRB): AORTIC VALVE REPLACEMENT (AVR) USING INSPIRIS VALVE SIZE 21MM (N/A) CLIPPING OF ATRIAL APPENDAGE USING PRO2 CLIP SIZE 40MM (N/A) TRANSESOPHAGEAL ECHOCARDIOGRAM (TEE) (N/A) Subjective: No complaints  Objective: Vital signs in last 24 hours: Temp:  [95.5 F (35.3 C)-100 F (37.8 C)] 97.5 F (36.4 C) (04/23 0700) Pulse Rate:  [81-106] 89 (04/23 0700) Cardiac Rhythm: Normal sinus rhythm (04/22 2000) Resp:  [8-44] 8 (04/23 0700) BP: (81-115)/(43-90) 84/57 (04/23 0645) SpO2:  [96 %-100 %] 98 % (04/23 0700) Arterial Line BP: (72-154)/(46-81) 102/52 (04/23 0700) FiO2 (%):  [40 %-50 %] 40 % (04/22 2350) Weight:  [68.7 kg] 68.7 kg (04/23 0400)  Hemodynamic parameters for last 24 hours: PAP: (15-33)/(9-28) 25/16 CO:  [3 L/min-4.2 L/min] 4.2 L/min CI:  [1.8 L/min/m2-2.6 L/min/m2] 2.6 L/min/m2  Intake/Output from previous day: 04/22 0701 - 04/23 0700 In: 4184.5 [I.V.:2293.2; Blood:350; IV Piggyback:1541.4] Out: 2055 [Urine:1705; Chest Tube:350] Intake/Output this shift: No intake/output data recorded.  General appearance: alert and cooperative Neurologic: intact Heart: regular rate and rhythm, S1, S2 normal, no murmur Lungs: clear to auscultation bilaterally Extremities: edema mild Wound: dressing dry  Lab Results: Recent Labs    07/02/19 1800 07/02/19 1805 07/03/19 0226 07/03/19 0226 07/03/19 0236 07/03/19 0433  WBC 13.9*  --  10.3  --   --   --   HGB 9.8*   < > 9.5*   < > 9.5* 9.5*  HCT 30.2*   < > 29.8*   < > 28.0* 28.0*  PLT 178  --  157  --   --   --    < > = values in this interval not displayed.   BMET:  Recent Labs    07/02/19 1800 07/02/19 1805 07/03/19 0226 07/03/19 0226 07/03/19 0236 07/03/19 0433  NA 141   < > 139   < > 139 140  K 4.5   < > 4.4   < > 4.2 4.1  CL 109  --  108  --   --   --   CO2 25  --  23  --   --   --   GLUCOSE 119*  --  115*  --   --   --   BUN 13  --  12  --   --   --   CREATININE 0.63  --   0.61  --   --   --   CALCIUM 7.0*  --  7.2*  --   --   --    < > = values in this interval not displayed.    PT/INR:  Recent Labs    07/02/19 1201  LABPROT 18.6*  INR 1.6*   ABG    Component Value Date/Time   PHART 7.362 07/03/2019 0433   HCO3 26.6 07/03/2019 0433   TCO2 28 07/03/2019 0433   ACIDBASEDEF 2.0 07/02/2019 1950   O2SAT 99.0 07/03/2019 0433   CBG (last 3)  Recent Labs    07/03/19 0234 07/03/19 0431 07/03/19 0614  GLUCAP 120* 111* 114*   CXR: left base atelectasis  ECG: sinus, no acute changes.   Assessment/Plan: S/P Procedure(s) (LRB): AORTIC VALVE REPLACEMENT (AVR) USING INSPIRIS VALVE SIZE 21MM (N/A) CLIPPING OF ATRIAL APPENDAGE USING PRO2 CLIP SIZE 40MM (N/A) TRANSESOPHAGEAL ECHOCARDIOGRAM (TEE) (N/A)  POD 1 Hemodynamically stable in sinus rhythm. Will hold beta blocker for now since BP low normal.   DC chest tubes, swan, arterial line.  IS, OOB, ambulate as tolerated.  Diurese gently.  DM: glucose under good control. DC insulin drip and start SSI. Preop Hgb A1c was 7.1 on no meds.    LOS: 1 day    Lindsay Guerrero 07/03/2019

## 2019-07-03 NOTE — Anesthesia Postprocedure Evaluation (Signed)
Anesthesia Post Note  Patient: Lindsay Guerrero  Procedure(s) Performed: AORTIC VALVE REPLACEMENT (AVR) USING INSPIRIS VALVE SIZE 21MM (N/A Chest) CLIPPING OF ATRIAL APPENDAGE USING PRO2 CLIP SIZE 40MM (N/A Chest) TRANSESOPHAGEAL ECHOCARDIOGRAM (TEE) (N/A )     Patient location during evaluation: SICU Anesthesia Type: General Level of consciousness: awake and alert Pain management: pain level controlled Vital Signs Assessment: post-procedure vital signs reviewed and stable Respiratory status: spontaneous breathing, nonlabored ventilation, respiratory function stable and patient connected to nasal cannula oxygen Cardiovascular status: blood pressure returned to baseline and stable Postop Assessment: no apparent nausea or vomiting Anesthetic complications: no    Last Vitals:  Vitals:   07/03/19 1700 07/03/19 1800  BP: 108/74 99/78  Pulse:    Resp: 17 (!) 22  Temp:    SpO2:      Last Pain:  Vitals:   07/03/19 1600  TempSrc: Oral  PainSc: 4                  Effie Berkshire

## 2019-07-03 NOTE — Progress Notes (Signed)
Co2 level within normal limits after BIPAP use.  Trial off. Placed on 3L [place].

## 2019-07-03 NOTE — Plan of Care (Signed)
  Problem: Education: Goal: Knowledge of General Education information will improve Description: Including pain rating scale, medication(s)/side effects and non-pharmacologic comfort measures Outcome: Progressing   Problem: Health Behavior/Discharge Planning: Goal: Ability to manage health-related needs will improve Outcome: Progressing   Problem: Clinical Measurements: Goal: Ability to maintain clinical measurements within normal limits will improve Outcome: Progressing Goal: Will remain free from infection Outcome: Adequate for Discharge Goal: Diagnostic test results will improve Outcome: Progressing Goal: Respiratory complications will improve Outcome: Progressing Goal: Cardiovascular complication will be avoided Outcome: Progressing   Problem: Activity: Goal: Risk for activity intolerance will decrease Outcome: Progressing   Problem: Nutrition: Goal: Adequate nutrition will be maintained Outcome: Not Progressing   Problem: Coping: Goal: Level of anxiety will decrease Outcome: Progressing   Problem: Elimination: Goal: Will not experience complications related to bowel motility Outcome: Not Progressing Goal: Will not experience complications related to urinary retention Outcome: Progressing   Problem: Pain Managment: Goal: General experience of comfort will improve Outcome: Progressing   Problem: Safety: Goal: Ability to remain free from injury will improve Outcome: Adequate for Discharge   Problem: Skin Integrity: Goal: Risk for impaired skin integrity will decrease Outcome: Adequate for Discharge   Problem: Education: Goal: Knowledge of disease or condition will improve Outcome: Progressing   Problem: Activity: Goal: Risk for activity intolerance will decrease Outcome: Progressing   Problem: Cardiac: Goal: Will achieve and/or maintain hemodynamic stability Outcome: Progressing   Problem: Clinical Measurements: Goal: Postoperative complications will  be avoided or minimized Outcome: Progressing   Problem: Respiratory: Goal: Respiratory status will improve Outcome: Progressing   Problem: Skin Integrity: Goal: Wound healing without signs and symptoms of infection Outcome: Progressing Goal: Risk for impaired skin integrity will decrease Outcome: Progressing   Problem: Urinary Elimination: Goal: Ability to achieve and maintain adequate renal perfusion and functioning will improve Outcome: Progressing

## 2019-07-03 NOTE — Progress Notes (Signed)
Patient ID: Lindsay Guerrero, female   DOB: October 19, 1955, 64 y.o.   MRN: [id number] TCTS Evening Rounds:  Hemodynamically stable.  Feels well Diuresing today.  BMET    Component Value Date/Time   NA 139 07/03/2019 1606   NA 140 05/14/2019 1145   K 3.7 07/03/2019 1606   CL 105 07/03/2019 1606   CO2 28 07/03/2019 1606   GLUCOSE 134 (H) 07/03/2019 1606   BUN 10 07/03/2019 1606   BUN 17 05/14/2019 1145   CREATININE 0.65 07/03/2019 1606   CALCIUM 7.2 (L) 07/03/2019 1606   GFRNONAA >60 07/03/2019 1606   GFRAA >60 07/03/2019 1606   CBC    Component Value Date/Time   WBC 11.0 (H) 07/03/2019 1606   RBC 3.10 (L) 07/03/2019 1606   HGB 9.1 (L) 07/03/2019 1606   HGB 12.2 05/14/2019 1145   HCT 28.7 (L) 07/03/2019 1606   HCT 36.9 05/14/2019 1145   PLT 142 (L) 07/03/2019 1606   PLT 338 05/14/2019 1145   MCV 92.6 07/03/2019 1606   MCV 86 05/14/2019 1145   MCH 29.4 07/03/2019 1606   MCHC 31.7 07/03/2019 1606   RDW 14.6 07/03/2019 1606   RDW 13.2 05/14/2019 1145

## 2019-07-04 ENCOUNTER — Inpatient Hospital Stay (HOSPITAL_COMMUNITY): Payer: Commercial Managed Care - PPO

## 2019-07-04 LAB — BASIC METABOLIC PANEL
Anion gap: 6 (ref 5–15)
BUN: 11 mg/dL (ref 8–23)
CO2: 27 mmol/L (ref 22–32)
Calcium: 7.5 mg/dL — ABNORMAL LOW (ref 8.9–10.3)
Chloride: 106 mmol/L (ref 98–111)
Creatinine, Ser: 0.68 mg/dL (ref 0.44–1.00)
GFR calc Af Amer: >60 mL/min (ref >60)
GFR calc non Af Amer: >60 mL/min (ref >60)
Glucose, Bld: 105 mg/dL — ABNORMAL HIGH (ref 70–99)
Potassium: 4 mmol/L (ref 3.5–5.1)
Sodium: 139 mmol/L (ref 135–145)

## 2019-07-04 LAB — GLUCOSE, CAPILLARY
Glucose-Capillary: 113 mg/dL — ABNORMAL HIGH (ref 70–99)
Glucose-Capillary: 129 mg/dL — ABNORMAL HIGH (ref 70–99)
Glucose-Capillary: 132 mg/dL — ABNORMAL HIGH (ref 70–99)
Glucose-Capillary: 141 mg/dL — ABNORMAL HIGH (ref 70–99)
Glucose-Capillary: 147 mg/dL — ABNORMAL HIGH (ref 70–99)
Glucose-Capillary: 144 mg/dL — ABNORMAL HIGH (ref 70–99)

## 2019-07-04 LAB — CBC
HCT: 29.3 % — ABNORMAL LOW (ref 36.0–46.0)
Hemoglobin: 9.3 g/dL — ABNORMAL LOW (ref 12.0–15.0)
MCH: 29.3 pg (ref 26.0–34.0)
MCHC: 31.7 g/dL (ref 30.0–36.0)
MCV: 92.4 fL (ref 80.0–100.0)
Platelets: 152 10*3/uL (ref 150–400)
RBC: 3.17 MIL/uL — ABNORMAL LOW (ref 3.87–5.11)
RDW: 14.7 % (ref 11.5–15.5)
WBC: 11.3 10*3/uL — ABNORMAL HIGH (ref 4.0–10.5)
nRBC: 0 % (ref 0.0–0.2)

## 2019-07-04 MED ORDER — ACETAMINOPHEN 325 MG PO TABS
650.0000 mg | ORAL_TABLET | Freq: Four times a day (QID) | ORAL | Status: DC | PRN
  Administered 2019-07-04 – 2019-07-08 (×3): 650 mg via ORAL
  Filled 2019-07-04 (×3): qty 2

## 2019-07-04 MED ORDER — ASPIRIN EC 325 MG PO TBEC
325.0000 mg | DELAYED_RELEASE_TABLET | Freq: Every day | ORAL | Status: DC
  Administered 2019-07-05 – 2019-07-09 (×5): 325 mg via ORAL
  Filled 2019-07-04 (×5): qty 1

## 2019-07-04 MED ORDER — SODIUM CHLORIDE 0.9% FLUSH: 3.0000 mL | INTRAVENOUS | Status: DC | PRN

## 2019-07-04 MED ORDER — PANTOPRAZOLE SODIUM 40 MG PO TBEC
40.0000 mg | DELAYED_RELEASE_TABLET | Freq: Every day | ORAL | Status: DC
  Administered 2019-07-05 – 2019-07-12 (×8): 40 mg via ORAL
  Filled 2019-07-04 (×8): qty 1

## 2019-07-04 MED ORDER — ONDANSETRON HCL 4 MG PO TABS: 4.0000 mg | ORAL_TABLET | Freq: Four times a day (QID) | ORAL | Status: DC | PRN

## 2019-07-04 MED ORDER — INSULIN ASPART 100 UNIT/ML ~~LOC~~ SOLN
0.0000 [IU] | Freq: Three times a day (TID) | SUBCUTANEOUS | Status: DC
  Administered 2019-07-04 – 2019-07-05 (×4): 2 [IU] via SUBCUTANEOUS
  Administered 2019-07-05: 22:00:00 4 [IU] via SUBCUTANEOUS
  Administered 2019-07-06: 2 [IU] via SUBCUTANEOUS
  Administered 2019-07-06 – 2019-07-08 (×3): 4 [IU] via SUBCUTANEOUS
  Administered 2019-07-08 – 2019-07-09 (×2): 2 [IU] via SUBCUTANEOUS
  Administered 2019-07-10: 4 [IU] via SUBCUTANEOUS
  Administered 2019-07-10 – 2019-07-12 (×5): 2 [IU] via SUBCUTANEOUS

## 2019-07-04 MED ORDER — ONDANSETRON HCL 4 MG/2ML IJ SOLN: 4.0000 mg | Freq: Four times a day (QID) | INTRAMUSCULAR | Status: DC | PRN

## 2019-07-04 MED ORDER — POTASSIUM CHLORIDE CRYS ER 20 MEQ PO TBCR
20.0000 meq | EXTENDED_RELEASE_TABLET | Freq: Two times a day (BID) | ORAL | Status: DC
  Administered 2019-07-04 – 2019-07-05 (×4): 20 meq via ORAL
  Filled 2019-07-04 (×4): qty 1

## 2019-07-04 MED ORDER — DOCUSATE SODIUM 100 MG PO CAPS
200.0000 mg | ORAL_CAPSULE | Freq: Every day | ORAL | Status: DC
  Administered 2019-07-05 – 2019-07-11 (×6): 200 mg via ORAL
  Filled 2019-07-04 (×8): qty 2

## 2019-07-04 MED ORDER — TRAMADOL HCL 50 MG PO TABS
50.0000 mg | ORAL_TABLET | Freq: Four times a day (QID) | ORAL | Status: DC | PRN
  Administered 2019-07-06 – 2019-07-08 (×2): 50 mg via ORAL
  Filled 2019-07-04 (×2): qty 1

## 2019-07-04 MED ORDER — OXYCODONE HCL 5 MG PO TABS
5.0000 mg | ORAL_TABLET | ORAL | Status: DC | PRN
  Administered 2019-07-04: 12:00:00 5 mg via ORAL
  Administered 2019-07-04: 22:00:00 10 mg via ORAL
  Administered 2019-07-05 – 2019-07-07 (×6): 5 mg via ORAL
  Administered 2019-07-07: 10 mg via ORAL
  Administered 2019-07-07: 5 mg via ORAL
  Administered 2019-07-08 (×2): 10 mg via ORAL
  Administered 2019-07-10 – 2019-07-11 (×5): 5 mg via ORAL
  Administered 2019-07-12: 10 mg via ORAL
  Filled 2019-07-04: qty 1
  Filled 2019-07-04: qty 2
  Filled 2019-07-04 (×2): qty 1
  Filled 2019-07-04: qty 2
  Filled 2019-07-04 (×3): qty 1
  Filled 2019-07-04 (×2): qty 2
  Filled 2019-07-04 (×6): qty 1
  Filled 2019-07-04: qty 2
  Filled 2019-07-04 (×2): qty 1

## 2019-07-04 MED ORDER — SODIUM CHLORIDE 0.9% FLUSH
3.0000 mL | Freq: Two times a day (BID) | INTRAVENOUS | Status: DC
  Administered 2019-07-04 – 2019-07-12 (×12): 3 mL via INTRAVENOUS

## 2019-07-04 MED ORDER — SODIUM CHLORIDE 0.9 % IV SOLN: 250.0000 mL | INTRAVENOUS | Status: DC | PRN

## 2019-07-04 MED ORDER — FUROSEMIDE 40 MG PO TABS
40.0000 mg | ORAL_TABLET | Freq: Every day | ORAL | Status: DC
  Administered 2019-07-04: 40 mg via ORAL
  Filled 2019-07-04 (×2): qty 1

## 2019-07-04 MED ORDER — ~~LOC~~ CARDIAC SURGERY, PATIENT & FAMILY EDUCATION: Freq: Once | Status: AC

## 2019-07-04 NOTE — Progress Notes (Signed)
2 Days Post-Op Procedure(s) (LRB): AORTIC VALVE REPLACEMENT (AVR) USING INSPIRIS VALVE SIZE 21MM (N/A) CLIPPING OF ATRIAL APPENDAGE USING PRO2 CLIP SIZE 40MM (N/A) TRANSESOPHAGEAL ECHOCARDIOGRAM (TEE) (N/A) Subjective: No complaints. Ambulating well.  Objective: Vital signs in last 24 hours: Temp:  [98.1 F (36.7 C)-98.7 F (37.1 C)] 98.3 F (36.8 C) (04/24 0300) Pulse Rate:  [78-91] 78 (04/24 0700) Cardiac Rhythm: Normal sinus rhythm (04/24 0400) Resp:  [11-30] 11 (04/24 0700) BP: (68-115)/(50-85) 93/59 (04/24 0700) SpO2:  [81 %-100 %] 96 % (04/24 0700) Weight:  [69.1 kg] 69.1 kg (04/24 0600)  Hemodynamic parameters for last 24 hours:    Intake/Output from previous day: 04/23 0701 - 04/24 0700 In: 1025.3 [P.O.:720; I.V.:305.3] Out: 1690 [Urine:1670; Chest Tube:20] Intake/Output this shift: No intake/output data recorded.  General appearance: alert and cooperative Neurologic: intact Heart: regular rate and rhythm, S1, S2 normal, no murmur Lungs: decreased in bases Extremities: edema mild Wound: dressing dry  Lab Results: Recent Labs    07/03/19 1606 07/04/19 0322  WBC 11.0* 11.3*  HGB 9.1* 9.3*  HCT 28.7* 29.3*  PLT 142* 152   BMET:  Recent Labs    07/03/19 1606 07/04/19 0322  NA 139 139  K 3.7 4.0  CL 105 106  CO2 28 27  GLUCOSE 134* 105*  BUN 10 11  CREATININE 0.65 0.68  CALCIUM 7.2* 7.5*    PT/INR:  Recent Labs    07/02/19 1201  LABPROT 18.6*  INR 1.6*   ABG    Component Value Date/Time   PHART 7.362 07/03/2019 0433   HCO3 26.6 07/03/2019 0433   TCO2 28 07/03/2019 0433   ACIDBASEDEF 2.0 07/02/2019 1950   O2SAT 99.0 07/03/2019 0433   CBG (last 3)  Recent Labs    07/03/19 2039 07/03/19 2302 07/04/19 0406  GLUCAP 152* 133* 113*   CXR: bibasilar atelectasis.  Assessment/Plan: S/P Procedure(s) (LRB): AORTIC VALVE REPLACEMENT (AVR) USING INSPIRIS VALVE SIZE 21MM (N/A) CLIPPING OF ATRIAL APPENDAGE USING PRO2 CLIP SIZE 40MM  (N/A) TRANSESOPHAGEAL ECHOCARDIOGRAM (TEE) (N/A)  POD 2  Hemodynamically stable in sinus rhythm. BP is low normal so will continue to hold Coreg and Cozaar.  Volume excess: wt is still about 12 lbs over preop if accurate. Continue gentle diuresis.  DC sleeve and foley.  Continue IS, ambulation  Glucose 152 last pm but 113 this am. Preop Hgb A1c was 7.1 with no hx of DM on no meds. She has had glucose of 140-150 preop. This will need to be watched closely and followed up by PCP.  Transfer to 4E.   LOS: 2 days    Gaye Pollack 07/04/2019

## 2019-07-04 NOTE — Progress Notes (Signed)
Pt arrived to rm 4E15 from [place]. Initiated tele. VSS. CHG done. Assessment performed. Bed alarm on. Call bell within reach.   Lavenia Atlas, RN

## 2019-07-04 NOTE — Progress Notes (Signed)
0800 Unable to cover AM blood sugar r/t pt eating breakfast at shift change, no preprandial CBG taken ok to remove introducer and foley; tx pt to 4E Poor appetite this morning Endorses pain control  0900 foley and introducer removed Pt instructed on 68min bedrest post sleeve removal Awaiting tx bed  1000 Pt up to bedside to complete oral care.  Reviewed sternal precautions; booklet provided. BP remains soft  1100 Pt dozing off, O2 sats as low as high 70s without O2. OSA in hx, pt in NAD. When asked about SOB, pt endorses difficulty distinguishing between her anxiety and any new SOB. Instructed to deep breathe and cough if spO2 alarm rings. I.S. [id number] encouraged.  1200 Poor appetite @ lunch. Ensure provided.  1300 Pt encouraged to void. >100cc voided (not all captured in hat)  1500 Report called to 4E  1600 Husband @ bedside Rip Harbour, RN [employer]) transported pt and all belongings to 4E on monitor.

## 2019-07-05 LAB — GLUCOSE, CAPILLARY
Glucose-Capillary: 115 mg/dL — ABNORMAL HIGH (ref 70–99)
Glucose-Capillary: 135 mg/dL — ABNORMAL HIGH (ref 70–99)
Glucose-Capillary: 114 mg/dL — ABNORMAL HIGH (ref 70–99)
Glucose-Capillary: 181 mg/dL — ABNORMAL HIGH (ref 70–99)

## 2019-07-05 MED ORDER — ENOXAPARIN SODIUM 40 MG/0.4ML ~~LOC~~ SOLN
40.0000 mg | SUBCUTANEOUS | Status: DC
  Administered 2019-07-05 – 2019-07-08 (×4): 40 mg via SUBCUTANEOUS
  Filled 2019-07-05 (×5): qty 0.4

## 2019-07-05 MED ORDER — LORAZEPAM 1 MG PO TABS
1.0000 mg | ORAL_TABLET | Freq: Two times a day (BID) | ORAL | Status: DC | PRN
  Administered 2019-07-05 – 2019-07-11 (×8): 1 mg via ORAL
  Filled 2019-07-05 (×8): qty 1

## 2019-07-05 MED ORDER — POLYETHYLENE GLYCOL 3350 17 G PO PACK
17.0000 g | PACK | Freq: Once | ORAL | Status: AC
  Administered 2019-07-05: 17 g via ORAL
  Filled 2019-07-05: qty 1

## 2019-07-05 NOTE — Progress Notes (Signed)
All Pacing wires removed per order. Pt tolerated well. Sites cleaned and dry. Wires ends intact. Vss. Pt on bed bed rest for one hour.   Lavenia Atlas, RN

## 2019-07-05 NOTE — Progress Notes (Addendum)
      [address]       [place] [address]             [phone]        3 Days Post-Op Procedure(s) (LRB): AORTIC VALVE REPLACEMENT (AVR) USING INSPIRIS VALVE SIZE 21MM (N/A) CLIPPING OF ATRIAL APPENDAGE USING PRO2 CLIP SIZE 40MM (N/A) TRANSESOPHAGEAL ECHOCARDIOGRAM (TEE) (N/A)  Subjective: Patient passing flatus but no bowel movement yet. She has no other specific complaints this am  Objective: Vital signs in last 24 hours: Temp:  [97.4 F (36.3 C)-99.1 F (37.3 C)] 99.1 F (37.3 C) (04/25 0516) Pulse Rate:  [81-97] 97 (04/25 0516) Cardiac Rhythm: Normal sinus rhythm (04/24 2344) Resp:  [10-21] 20 (04/25 0516) BP: (87-113)/(53-75) 105/70 (04/25 0516) SpO2:  [89 %-99 %] 96 % (04/25 0516) Weight:  [69.1 kg] 69.1 kg (04/25 0516)  Pre op weight 63.8 kg Current Weight  07/05/19 69.1 kg       Intake/Output from previous day: 04/24 0701 - 04/25 0700 In: 410 [P.O.:410] Out: 45 [Urine:45]   Physical Exam:  Cardiovascular: Slightly tachy this am Pulmonary: Slightly diminished bibasilar breath sounds Abdomen: Soft, non tender, bowel sounds present. Extremities: Mild bilateral lower extremity edema. Wounds: Sternal dressing removed and wound is clean and dry.  No erythema or signs of infection.  Lab Results: CBC: Recent Labs    07/03/19 1606 07/04/19 0322  WBC 11.0* 11.3*  HGB 9.1* 9.3*  HCT 28.7* 29.3*  PLT 142* 152   BMET:  Recent Labs    07/03/19 1606 07/04/19 0322  NA 139 139  K 3.7 4.0  CL 105 106  CO2 28 27  GLUCOSE 134* 105*  BUN 10 11  CREATININE 0.65 0.68  CALCIUM 7.2* 7.5*    PT/INR:  Lab Results  Component Value Date   INR 1.6 (H) 07/02/2019   INR 1.0 06/30/2019   ABG:  INR: Will add last result for INR, ABG once components are confirmed Will add last 4 CBG results once components are confirmed  Assessment/Plan:  1. CV - History of a fib;s/p LA clip. SR with HR in the low 100's this am and SBP 90's to low  100's. She has not been on BB secondary to labile BP but with HR increasing, may need to start one in the next day or so. 2.  Pulmonary - On room air. Will order CXR for am. 3. Volume Overload - On Lasix 40 mg daily 4.  Expected post op acute blood loss anemia - H and H yesterday stable at 9.3 and 29.3 5. Remove EPW 6. LOC if no bowel movement later today 7. CBGs 147/144/115. Pre op HGA1C 7.1. She is a new diabetic and will need close medical follow up after discharge. Her glucose has not been above 145 so will not start oral agent and will have patient follow up with medical doctor after discharge. 8. Likely home Tuesday or Wednesday  Sharalyn Ink [facility] 07/05/2019,7:23 AM   Chart reviewed, patient examined, agree with above. BP is low normal so have to go slow with diuresis.

## 2019-07-05 NOTE — Progress Notes (Signed)
Pt ambulated x 400 feet around unit will front wheel walker, pt tolerated well will continue to monitor

## 2019-07-06 ENCOUNTER — Inpatient Hospital Stay (HOSPITAL_COMMUNITY): Payer: Commercial Managed Care - PPO

## 2019-07-06 LAB — TYPE AND SCREEN
ABO/RH(D): A POS
Antibody Screen: NEGATIVE
Unit division: 0
Unit division: 0

## 2019-07-06 LAB — BPAM RBC
Blood Product Expiration Date: 202105082359
Blood Product Expiration Date: 202105092359
ISSUE DATE / TIME: 202104221026
ISSUE DATE / TIME: 202104221026
Unit Type and Rh: 6200
Unit Type and Rh: 6200

## 2019-07-06 LAB — CBC
HCT: 27.7 % — ABNORMAL LOW (ref 36.0–46.0)
Hemoglobin: 8.7 g/dL — ABNORMAL LOW (ref 12.0–15.0)
MCH: 28.9 pg (ref 26.0–34.0)
MCHC: 31.4 g/dL (ref 30.0–36.0)
MCV: 92 fL (ref 80.0–100.0)
Platelets: 227 10*3/uL (ref 150–400)
RBC: 3.01 MIL/uL — ABNORMAL LOW (ref 3.87–5.11)
RDW: 14.9 % (ref 11.5–15.5)
WBC: 8.5 10*3/uL (ref 4.0–10.5)
nRBC: 0 % (ref 0.0–0.2)

## 2019-07-06 LAB — GLUCOSE, CAPILLARY
Glucose-Capillary: 173 mg/dL — ABNORMAL HIGH (ref 70–99)
Glucose-Capillary: 115 mg/dL — ABNORMAL HIGH (ref 70–99)
Glucose-Capillary: 122 mg/dL — ABNORMAL HIGH (ref 70–99)

## 2019-07-06 MED ORDER — FUROSEMIDE 10 MG/ML IJ SOLN
40.0000 mg | Freq: Once | INTRAMUSCULAR | Status: AC
  Administered 2019-07-06: 40 mg via INTRAVENOUS
  Filled 2019-07-06: qty 4

## 2019-07-06 MED ORDER — ASPIRIN 325 MG PO TBEC: 325.0000 mg | DELAYED_RELEASE_TABLET | Freq: Every day | ORAL | 0 refills | Status: DC

## 2019-07-06 MED ORDER — FUROSEMIDE 40 MG PO TABS
40.0000 mg | ORAL_TABLET | Freq: Every day | ORAL | Status: DC
  Administered 2019-07-07 – 2019-07-09 (×3): 40 mg via ORAL
  Filled 2019-07-06 (×3): qty 1

## 2019-07-06 MED ORDER — METOLAZONE 5 MG PO TABS
2.5000 mg | ORAL_TABLET | Freq: Once | ORAL | Status: AC
  Administered 2019-07-06: 12:00:00 2.5 mg via ORAL
  Filled 2019-07-06: qty 1

## 2019-07-06 MED ORDER — POTASSIUM CHLORIDE CRYS ER 20 MEQ PO TBCR
20.0000 meq | EXTENDED_RELEASE_TABLET | Freq: Two times a day (BID) | ORAL | Status: DC
  Administered 2019-07-06 – 2019-07-12 (×12): 20 meq via ORAL
  Filled 2019-07-06 (×12): qty 1

## 2019-07-06 MED ORDER — POTASSIUM CHLORIDE CRYS ER 20 MEQ PO TBCR
20.0000 meq | EXTENDED_RELEASE_TABLET | Freq: Every day | ORAL | Status: DC
  Administered 2019-07-06: 20 meq via ORAL
  Filled 2019-07-06: qty 1

## 2019-07-06 NOTE — Progress Notes (Signed)
CARDIAC REHAB PHASE I   PRE:  Rate/Rhythm: 110 ST  BP:  Supine:   Sitting: 109/68  Standing:    SaO2: 93% 1L -1/2 L  MODE:  Ambulation: 470 ft   POST:  Rate/Rhythm: 116 ST  BP:  Supine:   Sitting: 102/63  Standing:    SaO2: 89-90%ra hall  91% room  0925-0944 Pt walked 470 ft on RA with asst x 1. Gait steady. Stopped once to rest. Checked sats and 89-90%. Encouraged pursed lip breathing. Pt has been up in room walking to bathroom also.  Encouraged more walks today. No dizziness. Pt wants to wait until day of d/c for education. Will refer to [place] CRP 2.  Graylon Good, RN BSN  07/06/2019 9:40 AM

## 2019-07-06 NOTE — Progress Notes (Addendum)
      [address]       [place] [address]             [phone]        4 Days Post-Op Procedure(s) (LRB): AORTIC VALVE REPLACEMENT (AVR) USING INSPIRIS VALVE SIZE 21MM (N/A) CLIPPING OF ATRIAL APPENDAGE USING PRO2 CLIP SIZE 40MM (N/A) TRANSESOPHAGEAL ECHOCARDIOGRAM (TEE) (N/A)  Subjective: Patient had a bowel movement yesterday. She has no specific complaint this am.  Objective: Vital signs in last 24 hours: Temp:  [98.2 F (36.8 C)-99.7 F (37.6 C)] 98.9 F (37.2 C) (04/26 0323) Pulse Rate:  [93-99] 99 (04/26 0323) Cardiac Rhythm: Normal sinus rhythm (04/26 0323) Resp:  [15-25] 25 (04/26 0323) BP: (95-118)/(61-84) 118/75 (04/26 0323) SpO2:  [90 %-97 %] 94 % (04/26 0323) Weight:  [69.7 kg] 69.7 kg (04/26 0534)  Pre op weight 63.8 kg Current Weight  07/06/19 69.7 kg       Intake/Output from previous day: 04/25 0701 - 04/26 0700 In: 320 [P.O.:320] Out: -    Physical Exam:  Cardiovascular: Tachy this am Pulmonary: Slightly diminished bibasilar breath sounds Abdomen: Soft, non tender, bowel sounds present. Extremities: SCDs in place Wounds: Sternal wound is clean and dry.  No erythema or signs of infection.  Lab Results: CBC: Recent Labs    07/04/19 0322 07/06/19 0206  WBC 11.3* 8.5  HGB 9.3* 8.7*  HCT 29.3* 27.7*  PLT 152 227   BMET:  Recent Labs    07/03/19 1606 07/04/19 0322  NA 139 139  K 3.7 4.0  CL 105 106  CO2 28 27  GLUCOSE 134* 105*  BUN 10 11  CREATININE 0.65 0.68  CALCIUM 7.2* 7.5*    PT/INR:  Lab Results  Component Value Date   INR 1.6 (H) 07/02/2019   INR 1.0 06/30/2019   ABG:  INR: Will add last result for INR, ABG once components are confirmed Will add last 4 CBG results once components are confirmed  Assessment/Plan:  1. CV - History of a fib;s/p LA clip. SR with HR in the low 90's this am.  She has not been on BB secondary to labile BP but with HR increasing, would like to start. Will discuss  with Dr. Cyndia Bent. 2.  Pulmonary - On room air. CXR this am appears to show bibasilar atelectasis and pleural effusions. Encourage incentive spirometer. 3. Volume Overload - On Lasix 40 mg orally daily but will give Lasix  IV this am 4.  Expected post op acute blood loss anemia - H and H this am stable at 8.7 and 27.7 5. CBGs 114/181/173. Pre op HGA1C 7.1. She is a new diabetic and will need close medical follow up after discharge. Her glucose has not been above 145 so will not start oral agent and will have patient follow up with medical doctor after discharge. 6. Possibly home in am  Sharalyn Ink [facility] 07/06/2019,7:06 AM     Chart reviewed, patient examined, agree with above. She is doing well overall. Continue mobilization today.  Wt is still 13 lbs over preop if accurate. Will continue lasix and give her a dose of metolazone today. Replete K+. Check BMET in am.

## 2019-07-06 NOTE — Progress Notes (Signed)
Patient ambulated independently in hallway.will monitor patient. Danaya Geddis, Bettina Gavia rN

## 2019-07-06 NOTE — Progress Notes (Signed)
Patient ambulated 470 feet in hallway independently. Will monitor . Gavrielle Streck, Bettina Gavia RN

## 2019-07-06 NOTE — Progress Notes (Addendum)
Patient oxygen saturation dipping into the mid to high 80s on room air. Patient placed on 1L of oxygen via Nasal Cannula and oxygen increased to 94-97%,  and incentive spirometer encouraged. Will monitor patient. Avrianna Smart, Bettina Gavia RN

## 2019-07-07 ENCOUNTER — Inpatient Hospital Stay (HOSPITAL_COMMUNITY): Payer: Commercial Managed Care - PPO

## 2019-07-07 HISTORY — PX: IR THORACENTESIS ASP PLEURAL SPACE W/IMG GUIDE: IMG5380

## 2019-07-07 LAB — BASIC METABOLIC PANEL
Anion gap: 9 (ref 5–15)
BUN: 12 mg/dL (ref 8–23)
CO2: 29 mmol/L (ref 22–32)
Calcium: 8.8 mg/dL — ABNORMAL LOW (ref 8.9–10.3)
Chloride: 99 mmol/L (ref 98–111)
Creatinine, Ser: 0.69 mg/dL (ref 0.44–1.00)
GFR calc Af Amer: >60 mL/min (ref >60)
GFR calc non Af Amer: >60 mL/min (ref >60)
Glucose, Bld: 126 mg/dL — ABNORMAL HIGH (ref 70–99)
Potassium: 3.8 mmol/L (ref 3.5–5.1)
Sodium: 137 mmol/L (ref 135–145)

## 2019-07-07 LAB — ECHO INTRAOPERATIVE TEE
Height: 62 in
Weight: 2250.46 oz

## 2019-07-07 LAB — GLUCOSE, CAPILLARY
Glucose-Capillary: 125 mg/dL — ABNORMAL HIGH (ref 70–99)
Glucose-Capillary: 133 mg/dL — ABNORMAL HIGH (ref 70–99)
Glucose-Capillary: 166 mg/dL — ABNORMAL HIGH (ref 70–99)
Glucose-Capillary: 110 mg/dL — ABNORMAL HIGH (ref 70–99)

## 2019-07-07 MED ORDER — LIDOCAINE HCL 1 % IJ SOLN
INTRAMUSCULAR | Status: AC
  Filled 2019-07-07: qty 20

## 2019-07-07 MED ORDER — FUROSEMIDE 40 MG PO TABS
40.0000 mg | ORAL_TABLET | Freq: Once | ORAL | Status: AC
  Administered 2019-07-07: 17:00:00 40 mg via ORAL
  Filled 2019-07-07: qty 1

## 2019-07-07 MED ORDER — CARVEDILOL 3.125 MG PO TABS
3.1250 mg | ORAL_TABLET | Freq: Two times a day (BID) | ORAL | Status: DC
  Administered 2019-07-07 – 2019-07-12 (×8): 3.125 mg via ORAL
  Filled 2019-07-07 (×10): qty 1

## 2019-07-07 MED ORDER — LIDOCAINE HCL 1 % IJ SOLN
INTRAMUSCULAR | Status: DC | PRN
  Administered 2019-07-07: 10 mL

## 2019-07-07 MED ORDER — POTASSIUM CHLORIDE CRYS ER 20 MEQ PO TBCR
20.0000 meq | EXTENDED_RELEASE_TABLET | Freq: Once | ORAL | Status: AC
  Administered 2019-07-07: 17:00:00 20 meq via ORAL
  Filled 2019-07-07: qty 1

## 2019-07-07 NOTE — Progress Notes (Signed)
[phone] Discussed sternal precautions, IS and walking for ex. Gave diabetic (since A1C at 7.1) and heart healthy diet. Encouraged carb counting. Referring to [place] CRP 2. Wrote down how to view discharge video. Pt's sats 91-92%RA while I educated her. Pt going to bathroom now. Has been walking in hall way frequently. Pt voiced understanding of ed. Graylon Good RN BSN 07/07/2019 9:26 AM

## 2019-07-07 NOTE — Progress Notes (Addendum)
[address]       [place] [address]             [phone]      5 Days Post-Op Procedure(s) (LRB): AORTIC VALVE REPLACEMENT (AVR) USING INSPIRIS VALVE SIZE 21MM (N/A) CLIPPING OF ATRIAL APPENDAGE USING PRO2 CLIP SIZE 40MM (N/A) TRANSESOPHAGEAL ECHOCARDIOGRAM (TEE) (N/A) Subjective: Says she rested well, no new complaints.  She notes her O2 saturations drop frequently, sat was 89% when I initially saw her this morning but improved to 93% during the interview.  She is independent with mobility.  Objective: Vital signs in last 24 hours: Temp:  [98 F (36.7 C)-100.5 F (38.1 C)] 98.1 F (36.7 C) (04/27 0741) Pulse Rate:  [92-106] 95 (04/27 0741) Cardiac Rhythm: Supraventricular tachycardia (04/27 0019) Resp:  [17-22] 22 (04/27 0741) BP: (96-122)/(68-80) 119/68 (04/27 0741) SpO2:  [92 %-97 %] 97 % (04/27 0741) Weight:  [66.7 kg] 66.7 kg (04/27 0500)     Intake/Output from previous day: 04/26 0701 - 04/27 0700 In: 0  Out: 3250 [Urine:3250] Intake/Output this shift: No intake/output data recorded.  General appearance: alert, cooperative and no distress Neurologic: intact Heart: NSR, had ~ 10 second run of SVT at 150-160/min around midnight Lungs: small -moderate left effusion on CXR yesterday.  Extremities: no edema.  Wound: The sternal incision is well approximated and dry. Pacer wires are out, CT exit site sutures are in place.   Lab Results: Recent Labs    07/06/19 0206  WBC 8.5  HGB 8.7*  HCT 27.7*  PLT 227   BMET:  Recent Labs    07/07/19 0258  NA 137  K 3.8  CL 99  CO2 29  GLUCOSE 126*  BUN 12  CREATININE 0.69  CALCIUM 8.8*    PT/INR: No results for input(s): LABPROT, INR in the last 72 hours. ABG    Component Value Date/Time   PHART 7.362 07/03/2019 0433   HCO3 26.6 07/03/2019 0433   TCO2 28 07/03/2019 0433   ACIDBASEDEF 2.0 07/02/2019 1950   O2SAT 99.0 07/03/2019 0433   CBG (last 3)  Recent Labs     07/06/19 1628 07/06/19 2153 07/07/19 0618  GLUCAP 122* 125* 133*    Assessment/Plan: S/P Procedure(s) (LRB): AORTIC VALVE REPLACEMENT (AVR) USING INSPIRIS VALVE SIZE 21MM (N/A) CLIPPING OF ATRIAL APPENDAGE USING PRO2 CLIP SIZE 40MM (N/A) TRANSESOPHAGEAL ECHOCARDIOGRAM (TEE) (N/A)    LOS: 5 days   -POD-5 AVR and clipping of left atrial appendage. Making good progress with mobility. History of a-fib but holding mostly SR post-op. Had a brief fun of SVT last night. Not on a beta blocker yet due to soft BP earlier this admission but this has improved. She has developed a rash after taking metoprolol in the past.   -Pulmonary- On RA but desaturates easily. CXR yesterday read as "fairly small" bilateral pleural effusions but the left appears more significant when compared with the pre-op PA / Lat film. Continue diuresis. Consider U/S by IR to see if this is large enough to warrant drainage.   -Volume excess- accuracy of weights is questionable but she responded well to diuresis yesterday. Continue Lasix.   -Fever- Tm 100.5 last night. ? Related to pleural effusions and ATX. Continue ambulation and lung work.  -DVT PPX- Continue with enoxaparin.  Antony Odea, PA-C [phone] 07/07/2019   Chart reviewed, patient examined, agree with above. She had left thoracentesis today removing only 250 cc. CXR afterwards shows good resolution of  left effusion but still some right effusion. She is diuresing well. Wt is 7 lbs over preop so will continue diuresis.

## 2019-07-07 NOTE — Progress Notes (Signed)
Notified by CCMD that patient had burst of SVT 160's-170. Patient asymptomatic. Patient denies CP?Sob. Patient was unaware of episode. Will continue to monitor

## 2019-07-07 NOTE — Progress Notes (Signed)
Patient ambulated on room air independently in hallway. Will monitor . Parisha Beaulac, Bettina Gavia RN

## 2019-07-07 NOTE — Progress Notes (Signed)
Patient on 1L [place], oxygen removed and patient on room air now. Will monitor patient. Nelda Bucks, Bettina Gavia  rN

## 2019-07-07 NOTE — Plan of Care (Signed)
°  Problem: Activity: °Goal: Risk for activity intolerance will decrease °Outcome: Progressing °  °Problem: Elimination: °Goal: Will not experience complications related to bowel motility °Outcome: Progressing °Goal: Will not experience complications related to urinary retention °Outcome: Progressing °  °

## 2019-07-08 LAB — BASIC METABOLIC PANEL
Anion gap: 14 (ref 5–15)
BUN: 12 mg/dL (ref 8–23)
CO2: 29 mmol/L (ref 22–32)
Calcium: 9.1 mg/dL (ref 8.9–10.3)
Chloride: 92 mmol/L — ABNORMAL LOW (ref 98–111)
Creatinine, Ser: 0.79 mg/dL (ref 0.44–1.00)
GFR calc Af Amer: >60 mL/min (ref >60)
GFR calc non Af Amer: >60 mL/min (ref >60)
Glucose, Bld: 177 mg/dL — ABNORMAL HIGH (ref 70–99)
Potassium: 3.9 mmol/L (ref 3.5–5.1)
Sodium: 135 mmol/L (ref 135–145)

## 2019-07-08 LAB — GLUCOSE, CAPILLARY
Glucose-Capillary: 157 mg/dL — ABNORMAL HIGH (ref 70–99)
Glucose-Capillary: 143 mg/dL — ABNORMAL HIGH (ref 70–99)
Glucose-Capillary: 172 mg/dL — ABNORMAL HIGH (ref 70–99)
Glucose-Capillary: 130 mg/dL — ABNORMAL HIGH (ref 70–99)
Glucose-Capillary: 100 mg/dL — ABNORMAL HIGH (ref 70–99)

## 2019-07-08 MED ORDER — AMIODARONE HCL IN DEXTROSE 360-4.14 MG/200ML-% IV SOLN
60.0000 mg/h | INTRAVENOUS | Status: AC
  Administered 2019-07-08 (×3): 60 mg/h via INTRAVENOUS
  Filled 2019-07-08 (×2): qty 200

## 2019-07-08 MED ORDER — AMIODARONE HCL IN DEXTROSE 360-4.14 MG/200ML-% IV SOLN
30.0000 mg/h | INTRAVENOUS | Status: DC
  Administered 2019-07-08 – 2019-07-11 (×4): 30 mg/h via INTRAVENOUS
  Filled 2019-07-08 (×7): qty 200

## 2019-07-08 MED ORDER — DIGOXIN 125 MCG PO TABS
0.1250 mg | ORAL_TABLET | Freq: Every day | ORAL | Status: DC
  Filled 2019-07-08: qty 1

## 2019-07-08 MED ORDER — AMIODARONE LOAD VIA INFUSION
150.0000 mg | Freq: Once | INTRAVENOUS | Status: AC
  Administered 2019-07-08: 05:00:00 150 mg via INTRAVENOUS
  Filled 2019-07-08: qty 83.34

## 2019-07-08 MED ORDER — AMIODARONE IV BOLUS ONLY 150 MG/100ML
150.0000 mg | Freq: Once | INTRAVENOUS | Status: AC
  Administered 2019-07-08: 150 mg via INTRAVENOUS
  Filled 2019-07-08: qty 100

## 2019-07-08 MED ORDER — AMIODARONE IV BOLUS ONLY 150 MG/100ML
150.0000 mg | Freq: Once | INTRAVENOUS | Status: AC
  Administered 2019-07-08: 13:00:00 150 mg via INTRAVENOUS

## 2019-07-08 MED ORDER — METOPROLOL TARTRATE 5 MG/5ML IV SOLN
5.0000 mg | Freq: Once | INTRAVENOUS | Status: AC
  Administered 2019-07-08: 5 mg via INTRAVENOUS
  Filled 2019-07-08: qty 5

## 2019-07-08 MED ORDER — DIGOXIN 0.25 MG/ML IJ SOLN
0.5000 mg | Freq: Once | INTRAMUSCULAR | Status: AC
  Administered 2019-07-08: 0.5 mg via INTRAVENOUS
  Filled 2019-07-08: qty 2

## 2019-07-08 MED ORDER — DIPHENHYDRAMINE HCL 50 MG/ML IJ SOLN
12.5000 mg | Freq: Once | INTRAMUSCULAR | Status: AC
  Administered 2019-07-08: 18:00:00 12.5 mg via INTRAVENOUS
  Filled 2019-07-08: qty 1

## 2019-07-08 MED ORDER — POTASSIUM CHLORIDE CRYS ER 20 MEQ PO TBCR
20.0000 meq | EXTENDED_RELEASE_TABLET | Freq: Once | ORAL | Status: AC
  Administered 2019-07-08: 20 meq via ORAL
  Filled 2019-07-08: qty 1

## 2019-07-08 MED FILL — Albumin, Human Inj 5%: INTRAVENOUS | Qty: 250 | Status: AC

## 2019-07-08 MED FILL — Heparin Sodium (Porcine) Inj 1000 Unit/ML: INTRAMUSCULAR | Qty: 30 | Status: AC

## 2019-07-08 MED FILL — Magnesium Sulfate Inj 50%: INTRAMUSCULAR | Qty: 10 | Status: AC

## 2019-07-08 MED FILL — Sodium Chloride IV Soln 0.9%: INTRAVENOUS | Qty: 2000 | Status: AC

## 2019-07-08 MED FILL — Electrolyte-R (PH 7.4) Solution: INTRAVENOUS | Qty: 4000 | Status: AC

## 2019-07-08 MED FILL — Potassium Chloride Inj 2 mEq/ML: INTRAVENOUS | Qty: 40 | Status: AC

## 2019-07-08 MED FILL — Heparin Sodium (Porcine) Inj 1000 Unit/ML: INTRAMUSCULAR | Qty: 2500 | Status: AC

## 2019-07-08 MED FILL — Sodium Bicarbonate IV Soln 8.4%: INTRAVENOUS | Qty: 50 | Status: AC

## 2019-07-08 MED FILL — Lidocaine HCl Local Soln Prefilled Syringe 100 MG/5ML (2%): INTRAMUSCULAR | Qty: 10 | Status: AC

## 2019-07-08 MED FILL — Mannitol IV Soln 20%: INTRAVENOUS | Qty: 500 | Status: AC

## 2019-07-08 NOTE — Progress Notes (Addendum)
   07/08/19 0510 07/08/19 0620  Vitals  BP 96/77 100/72  MAP (mmHg) 85 80   Afib RVR rates 140s-160s continues. 0500 and 0600 BPs above.  Pt continues to be mildly anxious. PRNs given.  Daily weight not obtained at this time.  Will continue to monitor.

## 2019-07-08 NOTE — Progress Notes (Signed)
Patient HR 130s-140s. Notified PA. Ordered 5mg  IV Lopressor with benadryl.  Paulene Floor, RN

## 2019-07-08 NOTE — Progress Notes (Addendum)
      [address]       [place] [address]             [phone]        6 Days Post-Op Procedure(s) (LRB): AORTIC VALVE REPLACEMENT (AVR) USING INSPIRIS VALVE SIZE 21MM (N/A) CLIPPING OF ATRIAL APPENDAGE USING PRO2 CLIP SIZE 40MM (N/A) TRANSESOPHAGEAL ECHOCARDIOGRAM (TEE) (N/A)  Subjective: Patient felt fast pounding of heart earlier. She is asking for a BMET to be checked.  Objective: Vital signs in last 24 hours: Temp:  [97.9 F (36.6 C)-99.2 F (37.3 C)] 98 F (36.7 C) (04/28 0455) Pulse Rate:  [95-109] 109 (04/27 2330) Cardiac Rhythm: Sinus tachycardia (04/27 1900) Resp:  [15-22] 20 (04/28 0620) BP: (91-119)/(61-88) 100/72 (04/28 0620) SpO2:  [90 %-97 %] 96 % (04/28 0620)  Pre op weight 63.8 kg Current Weight  07/07/19 66.7 kg       Intake/Output from previous day: 04/27 0701 - 04/28 0700 In: 240 [P.O.:240] Out: 2750 [Urine:2750]   Physical Exam:  Cardiovascular: IRRR IRRR Pulmonary: Slightly diminished bibasilar breath sounds Abdomen: Soft, non tender, bowel sounds present. Extremities: No LE edema Wounds: Sternal wound is clean and dry.  No erythema or signs of infection.  Lab Results: CBC: Recent Labs    07/06/19 0206  WBC 8.5  HGB 8.7*  HCT 27.7*  PLT 227   BMET:  Recent Labs    07/07/19 0258  NA 137  K 3.8  CL 99  CO2 29  GLUCOSE 126*  BUN 12  CREATININE 0.69  CALCIUM 8.8*    PT/INR:  Lab Results  Component Value Date   INR 1.6 (H) 07/02/2019   INR 1.0 06/30/2019   ABG:  INR: Will add last result for INR, ABG once components are confirmed Will add last 4 CBG results once components are confirmed  Assessment/Plan:  1. CV - History of a fib;s/p LA clip. She went into a fib with RVR earlier this am and was put on an Amiodarone drip. She is on Coreg 3.125 mg bid. Will give another bolus of Amiodarone this am. With labile BP, unable to give second agent to help control a fib (may be able to consider Digoxin  if not controlled in a few hours) and although Lopressor would be better for HR control, she has an allergy (rash). 2.  Pulmonary - On room air. She had left thoracentesis yesterday with only 250 cc removed.Encourage incentive spirometer. 3. Volume Overload - On Lasix 40 mg orally daily  4.  Expected post op acute blood loss anemia - Last H and H stable at 8.7 and 27.7 5. Will check BMET   Donielle M ZimmermanPA-C 07/08/2019,7:10 AM    Chart reviewed, patient examined, agree with above. Went into atrial fib with RVR this am and started on amio. Still 140's after two boluses. May need to use digoxin for rate control but will wait till later today to see if rate comes down. Can't use cardizem with low normal BP. She has had occasional AF in the past. BMET ok this am. K+ 3.9. continue diuresis and Kdur.

## 2019-07-08 NOTE — Progress Notes (Signed)
Around [id number] pt converted to afb RVR rates 180s-190s. Pt was resting in bed at time of episode- she awoke and felt anxious and minor fluttering in her chest. Rate sustained >5 minutes, EKG obtained, initial BP 114/88. On-call provider Roxy Manns MD) notified and gave verbal orders for [facility] w/ bolus. Started. Vitals cycling frequently per red MEWS protocol.

## 2019-07-08 NOTE — Progress Notes (Signed)
Patient still in Afib RVR with rates 130s-150s. Notified PA. Ordered to give another bolus of amiodarone.

## 2019-07-09 DIAGNOSIS — Z953 Presence of xenogenic heart valve: Secondary | ICD-10-CM

## 2019-07-09 DIAGNOSIS — I480 Paroxysmal atrial fibrillation: Secondary | ICD-10-CM

## 2019-07-09 LAB — GLUCOSE, CAPILLARY
Glucose-Capillary: 142 mg/dL — ABNORMAL HIGH (ref 70–99)
Glucose-Capillary: 139 mg/dL — ABNORMAL HIGH (ref 70–99)
Glucose-Capillary: 138 mg/dL — ABNORMAL HIGH (ref 70–99)
Glucose-Capillary: 138 mg/dL — ABNORMAL HIGH (ref 70–99)

## 2019-07-09 LAB — MAGNESIUM: Magnesium: 1.9 mg/dL (ref 1.7–2.4)

## 2019-07-09 LAB — TSH: TSH: 1.395 u[IU]/mL (ref 0.350–4.500)

## 2019-07-09 MED ORDER — DIGOXIN 0.25 MG/ML IJ SOLN: 0.2500 mg | INTRAMUSCULAR | Status: DC

## 2019-07-09 MED ORDER — DIGOXIN 0.25 MG/ML IJ SOLN
0.2500 mg | Freq: Every day | INTRAMUSCULAR | Status: AC
  Administered 2019-07-09: 10:00:00 0.25 mg via INTRAVENOUS
  Filled 2019-07-09: qty 1

## 2019-07-09 MED ORDER — DIGOXIN 125 MCG PO TABS
0.1250 mg | ORAL_TABLET | Freq: Every day | ORAL | Status: DC
  Administered 2019-07-10: 0.125 mg via ORAL
  Filled 2019-07-09: qty 1

## 2019-07-09 MED ORDER — APIXABAN 5 MG PO TABS
5.0000 mg | ORAL_TABLET | Freq: Two times a day (BID) | ORAL | Status: DC
  Administered 2019-07-09 – 2019-07-12 (×7): 5 mg via ORAL
  Filled 2019-07-09 (×7): qty 1

## 2019-07-09 NOTE — Consult Note (Addendum)
Cardiology Consultation:   Patient ID: Lindsay Guerrero MRN: [id number]; DOB: 1956-02-25  Admit date: 07/02/2019 Date of Consult: 07/09/2019  Primary Care Provider: Bonnita Nasuti, MD Primary Cardiologist: Jenne Campus, MD  Primary Electrophysiologist:  None    Patient Profile:   Lindsay Guerrero is a 64 y.o. female with a hx of aortic stenosis, paroxysmal afib on Eliquis, HTN, HLD, and cardiomyopathy with normalization of EF who is being seen today for the evaluation of afib at the request of dr. Cyndia Bent.  History of Present Illness:   Lindsay Guerrero is followed by Dr. Mack Guise for the above cardiac issues. She was seen in December 2020 for sob. Patient had known AS and paroxysmal Afib triggered by cold drinks ro ice cream. For evaluation of AS an echo was ordered which showed preserved EF with severe AS. At follow-up TAVR vs open heart surgery was discussed. The patient was set up for cardiac cath 05/20/19 which showed patent coronary arteries with minimal Nonobstructive CAD and moderate AS with normal heart pressures. Patient was referred to CTTS for further work-up and was admitted 4/22 for which underwent Aortic valve replacement and clipping of atrial appendage. She is POD 7 and cardiology was consulted for afib RVR.    Past Medical History:  Diagnosis Date  . Anxiety state 11/19/2012   ICD10  . Aortic stenosis 12/13/2017   Moderate by echocardiogram from 2019 mean gradient 25  . Asthma    mild  . Atrial fibrillation ([place]) 06/28/2018  . CHF (congestive heart failure) ([place])   . Depression   . Diabetes mellitus without complication (HCC)    borderline  . Dyspnea    on exertion  . Dyspnea on exertion 11/01/2017  . Elective surgery for purposes other than treating health conditions 09/27/2010   ICD10  . Essential hypertension 06/30/2018  . Hepatitis    due to cytomegalovirus-resolved  . History of breast cancer 06/30/2018  . History of hiatal hernia    mild,found on a CT  .  Hyperlipidemia 06/30/2018  . Hypokalemia 06/30/2018  . Inflamed seborrheic keratosis 08/08/2012  . Malignant neoplasm of upper-inner quadrant of female breast ([place]) 11/01/2017   Primary  diagnosis  . Mitral regurgitation 12/13/2017   Mild to moderate based on echo from 2019  . NICM (nonischemic cardiomyopathy) ([place]) 11/01/2015  . Pneumonia 2018  . Precordial pain 11/01/2015  . Seborrheic keratoses 09/10/2016  . Sleep apnea    borderline    Past Surgical History:  Procedure Laterality Date  . AORTIC VALVE REPLACEMENT N/A 07/02/2019   Procedure: AORTIC VALVE REPLACEMENT (AVR) USING INSPIRIS VALVE SIZE 21MM;  Surgeon: Gaye Pollack, MD;  Location: [facility];  Service: Open Heart Surgery;  Laterality: N/A;  . BREAST LUMPECTOMY Left 2003   sentinal node bx.  . CESAREAN SECTION    . CLIPPING OF ATRIAL APPENDAGE N/A 07/02/2019   Procedure: CLIPPING OF ATRIAL APPENDAGE USING PRO2 CLIP SIZE 40MM;  Surgeon: Gaye Pollack, MD;  Location: [facility];  Service: Open Heart Surgery;  Laterality: N/A;  . COSMETIC SURGERY    . IR THORACENTESIS ASP PLEURAL SPACE W/IMG GUIDE  07/07/2019  . RIGHT/LEFT HEART CATH AND CORONARY ANGIOGRAPHY N/A 05/20/2019   Procedure: RIGHT/LEFT HEART CATH AND CORONARY ANGIOGRAPHY;  Surgeon: [name], MD;  Location: [facility];  Service: Cardiovascular;  Laterality: N/A;  . TEE WITHOUT CARDIOVERSION N/A 07/02/2019   Procedure: TRANSESOPHAGEAL ECHOCARDIOGRAM (TEE);  Surgeon: Gaye Pollack, MD;  Location: [facility];  Service: Open  Heart Surgery;  Laterality: N/A;  . TONSILLECTOMY       Home Medications:  Prior to Admission medications   Medication Sig Start Date End Date Taking? Authorizing Provider  acetaminophen (TYLENOL) 500 MG tablet Take 500-1,000 mg by mouth every 6 (six) hours as needed for headache (pain).   Yes [provider]  Acetylcysteine (NAC PO) Take 3 tablets by mouth daily.    Yes [provider]  amphetamine-dextroamphetamine (ADDERALL) 20  MG tablet Take 10-20 mg by mouth daily as needed (ADD and/or hypersomnolence).    Yes [provider]  buPROPion (WELLBUTRIN XL) 300 MG 24 hr tablet Take 300 mg by mouth every morning. 06/09/19  Yes [provider]  carvedilol (COREG) 25 MG tablet TAKE 1 TABLET BY MOUTH  TWICE DAILY WITH A MEAL Patient taking differently: Take 25 mg by mouth 2 (two) times daily with a meal.  06/19/19  Yes Park Liter, MD  ELIQUIS 5 MG TABS tablet TAKE 1 TABLET BY MOUTH  TWICE DAILY Patient taking differently: Take 5 mg by mouth 2 (two) times daily.  06/16/19  Yes Park Liter, MD  furosemide (LASIX) 20 MG tablet Take 1 tablet (20 mg total) by mouth daily as needed for fluid. 06/30/18  Yes Fuller Plan A, MD  gabapentin (NEURONTIN) 600 MG tablet Take 300 mg by mouth at bedtime as needed for pain. 06/09/19  Yes [provider]  LORazepam (ATIVAN) 1 MG tablet Take 1 mg by mouth at bedtime as needed for anxiety or sleep.  05/26/19  Yes [provider]  losartan (COZAAR) 25 MG tablet Take 25 mg by mouth daily.  04/01/19  Yes [provider]  ondansetron (ZOFRAN) 8 MG tablet Take 4-8 mg by mouth every 8 (eight) hours as needed (nausea while traveling).   Yes [provider]  oxymetazoline (AFRIN) 0.05 % nasal spray Place 1 spray into both nostrils daily as needed (allergies.).    Yes [provider]  potassium chloride SA (K-DUR,KLOR-CON) 20 MEQ tablet Take 20 mEq by mouth daily as needed (when takes lasix).  10/16/17  Yes [provider]  Prenatal Vit-Fe Fumarate-FA (MULTIVITAMIN-PRENATAL) 27-0.8 MG TABS tablet Take 1 tablet by mouth daily at 12 noon.   Yes [provider]  VITAMIN A PO Take 1 capsule by mouth 3 (three) times a week.    Yes [provider]  Vitamin D, Ergocalciferol, (DRISDOL) 1.25 MG (50000 UNIT) CAPS capsule Take 50,000 Units by mouth every 7 (seven) days.   Yes [provider]  albuterol (VENTOLIN  HFA) 108 (90 Base) MCG/ACT inhaler Inhale 2 puffs into the lungs every 6 (six) hours as needed for wheezing or shortness of breath.    [provider]  aspirin EC 325 MG EC tablet Take 1 tablet (325 mg total) by mouth daily. 07/07/19   Nani Skillern, PA-C  diltiazem (CARDIZEM) 30 MG tablet Take 30 mg by mouth every 8 (eight) hours as needed (a-fib).  12/19/18   [provider]  pramipexole (MIRAPEX) 1.5 MG tablet Take 0.375-1.5 tablets by mouth at bedtime as needed (restless legs).  09/20/17   [provider]    Inpatient Medications: Scheduled Meds: . aspirin EC  325 mg Oral Daily  . buPROPion  300 mg Oral q morning - 10a  . carvedilol  3.125 mg Oral BID WC  . [START ON 07/10/2019] digoxin  0.125 mg Oral Daily  . docusate sodium  200 mg Oral Daily  .  enoxaparin (LOVENOX) injection  40 mg Subcutaneous Q24H  . furosemide  40 mg Oral Daily  . insulin aspart  0-24 Units Subcutaneous TID AC & HS  . mouth rinse  15 mL Mouth Rinse BID  . pantoprazole  40 mg Oral QAC breakfast  . potassium chloride  20 mEq Oral BID  . sodium chloride flush  3 mL Intravenous Q12H   Continuous Infusions: . sodium chloride    . amiodarone 30 mg/hr (07/09/19 0616)   PRN Meds: sodium chloride, acetaminophen, gabapentin, levalbuterol, lidocaine, LORazepam, ondansetron **OR** ondansetron (ZOFRAN) IV, oxyCODONE, pramipexole, sodium chloride flush, traMADol  Allergies:    Allergies  Allergen Reactions  . Lisinopril Cough  . Iodine Rash    05/21/19 The pt states that she had a reaction years ago when eating shellfish and she has also developed a rash on skin with iodine use. Theodosia Quay RN,BSN 05/21/19 12:36 PM       . Metoprolol Rash    Social History:   Social History   Socioeconomic History  . Marital status: Married    Spouse name: Not on file  . Number of children: Not on file  . Years of education: Not on file  . Highest education level: Not on file    Occupational History  . Not on file  Tobacco Use  . Smoking status: Never Smoker  . Smokeless tobacco: Never Used  Substance and Sexual Activity  . Alcohol use: Yes    Comment: occasional social drinking (once per month)   . Drug use: Never  . Sexual activity: Yes    Partners: Male    Birth control/protection: None  Other Topics Concern  . Not on file  Social History Narrative  . Not on file   Social Determinants of Health   Financial Resource Strain:   . Difficulty of Paying Living Expenses:   Food Insecurity:   . Worried About Charity fundraiser in the Last Year:   . Arboriculturist in the Last Year:   Transportation Needs:   . Film/video editor (Medical):   [name] Lack of Transportation (Non-Medical):   Physical Activity:   . Days of Exercise per Week:   . Minutes of Exercise per Session:   Stress:   . Feeling of Stress :   Social Connections:   . Frequency of Communication with Friends and Family:   . Frequency of Social Gatherings with Friends and Family:   . Attends Religious Services:   . Active Member of Clubs or Organizations:   . Attends Archivist Meetings:   [name] Marital Status:   Intimate Partner Violence:   . Fear of Current or Ex-Partner:   . Emotionally Abused:   [name] Physically Abused:   . Sexually Abused:     Family History:   Family History  Problem Relation Age of Onset  . Heart Problems Mother   . Hypertension Mother   . Heart Problems Father   . Heart attack Sister      ROS:  Please see the history of present illness.  All other ROS reviewed and negative.     Physical Exam/Data:   Vitals:   07/09/19 0400 07/09/19 0500 07/09/19 0600 07/09/19 0742  BP: 91/66   (!) 110/95  Pulse: 63   (!) 103  Resp: 20  20 15   Temp: 98.4 F (36.9 C)   98.5 F (36.9 C)  TempSrc: Oral   Oral  SpO2: 94%   94%  Weight:  64.2 kg    Height:        Intake/Output Summary (Last 24 hours) at 07/09/2019 0949 Last data filed at 07/09/2019  0200 Gross per 24 hour  Intake 380 ml  Output 1900 ml  Net -1520 ml   Last 3 Weights 07/09/2019 07/07/2019 07/06/2019  Weight (lbs) 141 lb 8.6 oz 147 lb 153 lb 9.6 oz  Weight (kg) 64.2 kg 66.679 kg 69.673 kg     Body mass index is 25.89 kg/m.  General:  Well nourished, well developed, in no acute distress HEENT: normal Lymph: no adenopathy Neck: no JVD Endocrine:  No thryomegaly Vascular: No carotid bruits; FA pulses 2+ bilaterally without bruits  Cardiac:  normal S1, S2; Irreg Irreg, tachy; no murmur  Lungs:  clear to auscultation bilaterally, no wheezing, rhonchi or rales  Abd: soft, nontender, no hepatomegaly  Ext: no edema Musculoskeletal:  No deformities, BUE and BLE strength normal and equal Skin: warm and dry  Neuro:  CNs 2-12 intact, no focal abnormalities noted Psych:  Normal affect   EKG:  The EKG was personally reviewed and demonstrates:  Afib RVR, rates int he 180s, likely rate related ST/T wave changes Telemetry:  Telemetry was personally reviewed and demonstrates:  Afib RVR rates in the 130s  Relevant CV Studies:  Echo 04/23/19 1. Left ventricular ejection fraction, by estimation, is 65 to 70%. The  left ventricle has normal function. The left ventrical has no regional  wall motion abnormalities. Left ventricular diastolic parameters are  consistent with Grade II diastolic  dysfunction (pseudonormalization).  2. Right ventricular systolic function is normal. The right ventricular  size is normal. There is normal pulmonary artery systolic pressure.  3. Left atrial size was mildly dilated. No left atrial/left atrial  appendage thrombus was detected.  4. The mitral valve is normal in structure and function. trivial mitral  valve regurgitation. No evidence of mitral stenosis.  5. Progression of AS noted. August 2020: peak/mean gradient39/24 mmHg,  AVA 0.66, DI 0.27. Now Peak/mean gradient - 60/36 mmHg, AVA 0.73, DI -  0.23. The aortic valve is grossly normal.  Aortic valve regurgitation is  mild. Moderate to severe aortic valve  stenosis.  6. The inferior vena cava is normal in size with greater than 50%  respiratory variability, suggesting right atrial pressure of 3 mmHg.    Echo intraoperative [id number] Complications: No known complications during this procedure.  POST-OP IMPRESSIONS  - Left Ventricle: The left ventricle is unchanged from pre-bypass.  - Aorta: The aorta appears unchanged from pre-bypass.  - Left Atrial Appendage: A Pro 2 Clip was placed.  - Aortic Valve: No stenosis present. A bioprosthetic valve was placed,  leaflets  are not freely mobile Manufactured by; Resilia Size; 46mm. There is no  regurgitation. The gradient recorded across the prosthetic valve is within  the  expected range. No perivalvular leak noted.  - Mitral Valve: The mitral valve appears unchanged from pre-bypass.  - Tricuspid Valve: The tricuspid valve appears unchanged from pre-bypass.  - Pericardium: The pericardium appears unchanged from pre-bypass.  Laboratory Data:  High Sensitivity Troponin:  No results for input(s): TROPONINIHS in the last 720 hours.   Chemistry Recent Labs  Lab 07/04/19 0322 07/07/19 0258 07/08/19 0744  NA 139 137 135  K 4.0 3.8 3.9  CL 106 99 92*  CO2 27 29 29   GLUCOSE 105* 126* 177*  BUN 11 12 12   CREATININE 0.68 0.69 0.79  CALCIUM 7.5* 8.8* 9.1  GFRNONAA >60 >60 >60  GFRAA >60 >60 >60  ANIONGAP 6 9 14     No results for input(s): PROT, ALBUMIN, AST, ALT, ALKPHOS, BILITOT in the last 168 hours. Hematology Recent Labs  Lab 07/03/19 1606 07/04/19 0322 07/06/19 0206  WBC 11.0* 11.3* 8.5  RBC 3.10* 3.17* 3.01*  HGB 9.1* 9.3* 8.7*  HCT 28.7* 29.3* 27.7*  MCV 92.6 92.4 92.0  MCH 29.4 29.3 28.9  MCHC 31.7 31.7 31.4  RDW 14.6 14.7 14.9  PLT 142* 152 227   BNPNo results for input(s): BNP, PROBNP in the last 168 hours.  DDimer No results for input(s): DDIMER in the last 168 hours.   Radiology/Studies:  DG  Chest 1 View  Result Date: 07/07/2019 CLINICAL DATA:  Post left thoracentesis. EXAM: CHEST  1 VIEW COMPARISON:  07/06/2019 FINDINGS: Lungs are hypoinflated and demonstrate interval improvement in left pleural effusion post thoracentesis with suggestion of small amount of residual left pleural fluid/basilar atelectasis. No evidence of left-sided pneumothorax. Interval worsening of moderate size right pleural effusion with associated atelectasis. Linear atelectasis over the right perihilar region. Mild stable cardiomegaly. Remainder of the exam is unchanged. IMPRESSION: 1. Improvement in left pleural effusion post thoracentesis with small residual left effusion/basilar atelectasis. No pneumothorax. 2. Worsening moderate size right pleural effusion likely with associated basilar atelectasis. Linear atelectasis right perihilar region. Stable cardiomegaly. Electronically Signed   By: Marin Olp M.D.   On: 07/07/2019 13:39   DG Chest 2 View  Result Date: 07/06/2019 CLINICAL DATA:  Pleural effusion EXAM: CHEST - 2 VIEW COMPARISON:  July 04, 2019 FINDINGS: Bilateral pleural effusions, fairly small, are stable. There is bibasilar atelectatic change. A degree of airspace consolidation superimposed in the bases is suspected. Heart is mildly enlarged with pulmonary vascularity normal. Patient is status post aortic valve replacement. There is a left atrial appendage clamp. No adenopathy. No bone lesions. IMPRESSION: Stable pleural effusions bilaterally with bibasilar atelectasis and patchy airspace consolidation. Stable cardiac prominence. Postoperative changes noted. Electronically Signed   By: [name] III M.D.   On: 07/06/2019 08:24   IR THORACENTESIS ASP PLEURAL SPACE W/IMG GUIDE  Result Date: 07/07/2019 INDICATION: Shortness of breath. Status post recent open heart surgery. Left-sided pleural effusion. Request for therapeutic left thoracentesis. EXAM: ULTRASOUND GUIDED LEFT THORACENTESIS MEDICATIONS:  None. COMPLICATIONS: None immediate. PROCEDURE: An ultrasound guided thoracentesis was thoroughly discussed with the patient and questions answered. The benefits, risks, alternatives and complications were also discussed. The patient understands and wishes to proceed with the procedure. Written consent was obtained. Ultrasound was performed to localize and mark an adequate pocket of fluid in the left chest. The area was then prepped and draped in the normal sterile fashion. 1% Lidocaine was used for local anesthesia. Under ultrasound guidance a 6 Fr Safe-T-Centesis catheter was introduced. Thoracentesis was performed. The catheter was removed and a dressing applied. FINDINGS: A total of approximately 250 mL of clear, serosanguineous fluid was removed. IMPRESSION: Successful ultrasound guided left thoracentesis yielding 250 mL of pleural fluid. Read by: Ascencion Dike PA-C Electronically Signed   By: Aletta Edouard M.D.   On: 07/07/2019 13:25   {   Assessment and Plan:   Afib RVR with H/o of paroxysmal Afib - s/p clipping of atrial appendage 4/22 previously on Eliquis - Patient went into afib RVR 4/28 with rates up into the 180s and felt overall terrible. She was started on amio drip and was given some IV Lopressor.  - Unable to start IV Dilt due to hypotension - Prior to admission  she was on Coreg 25 mg BID, Cardizem 30 mg qH. She tried amio for a couple days but did not want to take it due to side effects. - she is on Coreg 3.125mg  BID - Digoxin IV 0.25mg  this am. Continue to load digoxin 0.45mcg x 3 more doses - Pateint remain in afib with rates in the 130s is feeling better. No CP, sob, dizziness - K+ 3.9 goal >4 - check mag and TSH  - Continue to load amiodarone and increase coreg as BP tolerates.   AS s/p AVR - Post Intraop echo showed no AI and normal gradients  Volume overload - Lasix 40 mg daily>>will stop since patient is at baseline weight  DM2 - new dx - plan to follow-up as  outpatient  For questions or updates, please contact [facility] Please consult [email or web address] for contact info under     Signed, Cadence Ninfa Meeker, PA-C  07/09/2019 9:49 AM   I have seen and examined the patient along with Cadence Ninfa Meeker, PA-C .  I have reviewed the chart, notes and new data.  I agree with PA/NP's note.  Key new complaints: Feels better now that her heart rate into the 130s rather than 180s, but with left to get out of bed and take a shower.  Denies dyspnea or dizziness. Key examination changes: Rapid irregular rhythm, but no audible murmurs, rubs or gallops.  No evidence of peripheral volume overload.  Weight is back down to preoperative weight.  Blood pressure is 90s/50s. Key new findings / data: Normal electrolytes, hemoglobin 8.7, chest tube has been removed  PLAN: Since the duration of atrial fibrillation seems to be approaching 24 hours would recommend restarting anticoagulants, in case we have to do a cardioversion. There is no room for additional room for conventional AV blocking agents due to her low blood pressure.  She has already been loaded with digoxin and is completing her amiodarone load. Hopefully she will spontaneously converted to normal rhythm overnight. Would like to start apixaban if okay with CV surgery.  Need to check if her pacemaker wires have been removed.  Tentatively scheduled for cardioversion tomorrow at noon, if we can get 3 doses of apixaban by then.  Sanda Klein, MD, [place] [phone] 07/09/2019, 11:19 AM

## 2019-07-09 NOTE — Progress Notes (Signed)
Still very tachycardic. Ventricular rate 130-140s at rest, 160s walking. Increased amiodarone back to 1mg /min for another 6h. Tentatively scheduled for DCCV at 1200h tomorrow. This procedure has been fully reviewed with the patient and informed consent has been obtained.

## 2019-07-09 NOTE — Progress Notes (Addendum)
[address]       [place] [address]             [phone]        7 Days Post-Op Procedure(s) (LRB): AORTIC VALVE REPLACEMENT (AVR) USING INSPIRIS VALVE SIZE 21MM (N/A) CLIPPING OF ATRIAL APPENDAGE USING PRO2 CLIP SIZE 40MM (N/A) TRANSESOPHAGEAL ECHOCARDIOGRAM (TEE) (N/A)  Subjective: Patient remains in a fib with RVR this am. She did not walk yesterday secondary to this.  Objective: Vital signs in last 24 hours: Temp:  [97.6 F (36.4 C)-99.8 F (37.7 C)] 98.4 F (36.9 C) (04/29 0400) Pulse Rate:  [51-165] 63 (04/29 0400) Cardiac Rhythm: Atrial fibrillation (04/28 1900) Resp:  [15-27] 20 (04/29 0400) BP: (89-126)/(63-102) 91/66 (04/29 0400) SpO2:  [92 %-99 %] 94 % (04/29 0400) Weight:  [64.2 kg] 64.2 kg (04/29 0500)  Pre op weight 63.8 kg Current Weight  07/09/19 64.2 kg      Intake/Output from previous day: 04/28 0701 - 04/29 0700 In: 620 [P.O.:620] Out: 1900 [Urine:1900]   Physical Exam:  Cardiovascular: IRRR IRRR Pulmonary: Slightly diminished bibasilar breath sounds  Abdomen: Soft, non tender, bowel sounds present. Extremities: No LE edema Wounds: Sternal wound is clean and dry.  No erythema or signs of infection.  Lab Results: CBC: No results for input(s): WBC, HGB, HCT, PLT in the last 72 hours. BMET:  Recent Labs    07/07/19 0258 07/08/19 0744  NA 137 135  K 3.8 3.9  CL 99 92*  CO2 29 29  GLUCOSE 126* 177*  BUN 12 12  CREATININE 0.69 0.79  CALCIUM 8.8* 9.1    PT/INR:  Lab Results  Component Value Date   INR 1.6 (H) 07/02/2019   INR 1.0 06/30/2019   ABG:  INR: Will add last result for INR, ABG once components are confirmed Will add last 4 CBG results once components are confirmed  Assessment/Plan:  1. CV - History of a fib;s/p LA clip. She went into a fib with RVR early 04/28 and was put on an Amiodarone drip. She has been given several boluses of Amiodarone also and started on oral Digoxin yesterday. She  has also been given Lopressor IV. She is on Coreg 3.125 mg bid.  With labile BP, unable to give second agent to help control a fib (would like to try Cardizem but unable secondary to low BP; although Lopressor would be better for HR control, she has an allergy (rash). She needs better rate control. As discussed with Dr. Cyndia Bent, will give IV Digoxin 0.25 this am and will ask cardiology to evaluate 2.  Pulmonary - On room air. She had left thoracentesis 04/27 with only 250 cc removed. Encourage incentive spirometer. 3. Volume Overload - On Lasix 40 mg orally daily  4.  Expected post op acute blood loss anemia - Last H and H stable at 8.7 and 27.7 5. CBGs 130/100/142. Pre op HGA1C 7.1. She is a new diabetic and will need follow up with medical doctor after discharge. Her glucose has been fairly well controlled so will not start oral medication at this time.   Lindsay M ZimmermanPA-C 07/09/2019,7:10 AM     Chart reviewed, patient examined, agree with above. She has remained in atrial fib with RVR 130's today despite IV amio and loading with digoxin. BP is low normal so can't increase BB or add Cardizem. Eliquis restarted. If she remains in rapid AF tomorrow she will get DCCV per cardiology. She can still  be OOB and ambulating. Discussed with pt, husband at bedside.

## 2019-07-09 NOTE — Progress Notes (Signed)
1400 Have been watching HR off and on all day to see if pt can walk. HR now 122 to 140s. Will continue to follow. Graylon Good RN BSN 07/09/2019 1:59 PM

## 2019-07-09 NOTE — Progress Notes (Signed)
Amiodarone dose changed to 60 mg / hr for 6 hours per cardiology.  Started at [facility].

## 2019-07-10 ENCOUNTER — Other Ambulatory Visit (HOSPITAL_COMMUNITY): Payer: Commercial Managed Care - PPO

## 2019-07-10 ENCOUNTER — Inpatient Hospital Stay (HOSPITAL_COMMUNITY): Payer: Commercial Managed Care - PPO | Admitting: Certified Registered Nurse Anesthetist

## 2019-07-10 ENCOUNTER — Encounter (HOSPITAL_COMMUNITY)
Admission: RE | Disposition: A | Discharge status: Home / Self Care | Payer: Self-pay | Source: Home / Self Care | Attending: Surgery

## 2019-07-10 ENCOUNTER — Encounter (HOSPITAL_COMMUNITY): Payer: Self-pay | Admitting: Surgery

## 2019-07-10 DIAGNOSIS — I4891 Unspecified atrial fibrillation: Secondary | ICD-10-CM

## 2019-07-10 DIAGNOSIS — I4819 Other persistent atrial fibrillation: Secondary | ICD-10-CM

## 2019-07-10 HISTORY — PX: CARDIOVERSION: SHX1299

## 2019-07-10 LAB — BASIC METABOLIC PANEL
Anion gap: 10 (ref 5–15)
BUN: 18 mg/dL (ref 8–23)
CO2: 30 mmol/L (ref 22–32)
Calcium: 9 mg/dL (ref 8.9–10.3)
Chloride: 97 mmol/L — ABNORMAL LOW (ref 98–111)
Creatinine, Ser: 0.87 mg/dL (ref 0.44–1.00)
GFR calc Af Amer: >60 mL/min (ref >60)
GFR calc non Af Amer: >60 mL/min (ref >60)
Glucose, Bld: 129 mg/dL — ABNORMAL HIGH (ref 70–99)
Potassium: 4 mmol/L (ref 3.5–5.1)
Sodium: 137 mmol/L (ref 135–145)

## 2019-07-10 LAB — GLUCOSE, CAPILLARY
Glucose-Capillary: 164 mg/dL — ABNORMAL HIGH (ref 70–99)
Glucose-Capillary: 106 mg/dL — ABNORMAL HIGH (ref 70–99)
Glucose-Capillary: 142 mg/dL — ABNORMAL HIGH (ref 70–99)

## 2019-07-10 SURGERY — CARDIOVERSION
Anesthesia: General

## 2019-07-10 MED ORDER — SODIUM CHLORIDE 0.9 % IV SOLN: INTRAVENOUS | Status: DC

## 2019-07-10 MED ORDER — SODIUM CHLORIDE 0.9 % IV SOLN: INTRAVENOUS | Status: DC | PRN

## 2019-07-10 MED ORDER — PROPOFOL 10 MG/ML IV BOLUS
INTRAVENOUS | Status: DC | PRN
  Administered 2019-07-10: 30 mg via INTRAVENOUS
  Administered 2019-07-10: 10 mg via INTRAVENOUS

## 2019-07-10 NOTE — Progress Notes (Addendum)
      [address]       [place] [address]             [phone]        8 Days Post-Op Procedure(s) (LRB): AORTIC VALVE REPLACEMENT (AVR) USING INSPIRIS VALVE SIZE 21MM (N/A) CLIPPING OF ATRIAL APPENDAGE USING PRO2 CLIP SIZE 40MM (N/A) TRANSESOPHAGEAL ECHOCARDIOGRAM (TEE) (N/A)  Subjective: Patient remains in a fib with RVR this am. She has no specific complaint this am  Objective: Vital signs in last 24 hours: Temp:  [98.4 F (36.9 C)-100.8 F (38.2 C)] 98.5 F (36.9 C) (04/30 0635) Pulse Rate:  [81-145] 101 (04/30 0635) Cardiac Rhythm: Atrial fibrillation (04/30 0635) Resp:  [15-27] 18 (04/30 [id number]) BP: (85-113)/(59-95) 89/69 (04/30 0635) SpO2:  [93 %-96 %] 94 % (04/30 0635) Weight:  [62.5 kg] 62.5 kg (04/30 0642)  Pre op weight 63.8 kg Current Weight  07/10/19 62.5 kg      Intake/Output from previous day: 04/29 0701 - 04/30 0700 In: 1031.5 [P.O.:840; I.V.:191.5] Out: 2200 [Urine:2200]   Physical Exam:  Cardiovascular: IRRR IRRR Pulmonary: Mostly clear Abdomen: Soft, non tender, bowel sounds present. Extremities: No LE edema Wounds: Sternal wound is clean and dry.  No erythema or signs of infection.  Lab Results: CBC: No results for input(s): WBC, HGB, HCT, PLT in the last 72 hours. BMET:  Recent Labs    07/08/19 0744  NA 135  K 3.9  CL 92*  CO2 29  GLUCOSE 177*  BUN 12  CREATININE 0.79  CALCIUM 9.1    PT/INR:  Lab Results  Component Value Date   INR 1.6 (H) 07/02/2019   INR 1.0 06/30/2019   ABG:  INR: Will add last result for INR, ABG once components are confirmed Will add last 4 CBG results once components are confirmed  Assessment/Plan:  1. CV - History of a fib;s/p LA clip. She went into a fib with RVR early 04/28 and remains in a fib with rate NOT well controlled. She was put on an Amiodarone drip. She has been given several boluses of Amiodarone also and started on Digoxin. She has also been given Lopressor IV.  She is on Coreg 3.125 mg bid.  Her rate is still not well controlled. Hopefully, DCCV today;appreciate cardiology's assistance.   2.  Pulmonary - On room air. She had left thoracentesis 04/27 with only 250 cc removed. Encourage incentive spirometer. 3. Volume Overload - On Lasix 40 mg orally daily  4.  Expected post op acute blood loss anemia - Last H and H stable at 8.7 and 27.7 5. CBGs 138/138/164. Pre op HGA1C 7.1. She is a new diabetic and will need follow up with medical doctor after discharge. Her glucose has been fairly well controlled so will not start oral medication at this time.   Donielle M ZimmermanPA-C 07/10/2019,7:23 AM    Chart reviewed, patient examined, agree with above. She feels ok but still rapid atrial fib on IV amio and digoxin. Cardiology planning DCCV around noon. Then hopefully home in a couple days on amio and digoxin. She had very infrequent episodes of AF prior to surgery so hopefully can get back to that.

## 2019-07-10 NOTE — Transfer of Care (Signed)
Immediate Anesthesia Transfer of Care Note  Patient: Lindsay Guerrero  Procedure(s) Performed: CARDIOVERSION (N/A )  Patient Location: Endoscopy Unit  Anesthesia Type:General  Level of Consciousness: awake, alert  and oriented  Airway & Oxygen Therapy: Patient Spontanous Breathing  Post-op Assessment: Report given to RN, Post -op Vital signs reviewed and stable and Patient moving all extremities X 4  Post vital signs: Reviewed and stable  Last Vitals:  Vitals Value Taken Time  BP 87/59 07/10/19 1226  Temp    Pulse 89 07/10/19 1226  Resp 27 07/10/19 1226  SpO2 94 % 07/10/19 1226  Vitals shown include unvalidated device data.  Last Pain:  Vitals:   07/10/19 1139  TempSrc: Temporal  PainSc: 0-No pain      Patients Stated Pain Goal: 0 [phone]  Complications: No apparent anesthesia complications

## 2019-07-10 NOTE — Interval H&P Note (Signed)
History and Physical Interval Note:  07/10/2019 12:10 PM  Lindsay Guerrero  has presented today for surgery, with the diagnosis of AFIB.  The various methods of treatment have been discussed with the patient and family. After consideration of risks, benefits and other options for treatment, the patient has consented to  Procedure(s): CARDIOVERSION (N/A) as a surgical intervention.  The patient's history has been reviewed, patient examined, no change in status, stable for surgery.  I have reviewed the patient's chart and labs.  Questions were answered to the patient's satisfaction.     Donato Heinz

## 2019-07-10 NOTE — H&P (View-Only) (Signed)
Progress Note  Patient Name: Lindsay Guerrero Date of Encounter: 07/10/2019  Primary Cardiologist: Jenne Campus, MD   Subjective   Patient is still in rapid afib. Plan for cardioversion today. Patient had a good night. No CP, sob, palpitations.   Inpatient Medications    Scheduled Meds: . apixaban  5 mg Oral BID  . buPROPion  300 mg Oral q morning - 10a  . carvedilol  3.125 mg Oral BID WC  . digoxin  0.125 mg Oral Daily  . docusate sodium  200 mg Oral Daily  . insulin aspart  0-24 Units Subcutaneous TID AC & HS  . mouth rinse  15 mL Mouth Rinse BID  . pantoprazole  40 mg Oral QAC breakfast  . potassium chloride  20 mEq Oral BID  . sodium chloride flush  3 mL Intravenous Q12H   Continuous Infusions: . sodium chloride    . amiodarone 30 mg/hr (07/10/19 0645)   PRN Meds: sodium chloride, acetaminophen, gabapentin, levalbuterol, lidocaine, LORazepam, ondansetron **OR** ondansetron (ZOFRAN) IV, oxyCODONE, pramipexole, sodium chloride flush, traMADol   Vital Signs    Vitals:   07/10/19 0400 07/10/19 0500 07/10/19 0635 07/10/19 0642  BP: 91/70 97/62 (!) 89/69   Pulse:   (!) 101   Resp: (!) 24 19 (!) 27 18  Temp:   98.5 F (36.9 C)   TempSrc:   Oral   SpO2:   94%   Weight:    62.5 kg  Height:        Intake/Output Summary (Last 24 hours) at 07/10/2019 0740 Last data filed at 07/10/2019 0645 Gross per 24 hour  Intake 1110.63 ml  Output 2200 ml  Net -1089.37 ml   Last 3 Weights 07/10/2019 07/09/2019 07/07/2019  Weight (lbs) 137 lb 12.6 oz 141 lb 8.6 oz 147 lb  Weight (kg) 62.5 kg 64.2 kg 66.679 kg      Telemetry    Afib HR 120-130, PVCs - Personally Reviewed  ECG    No new - Personally Reviewed  Physical Exam   GEN: No acute distress.   Neck: No JVD Cardiac: Irreg Irreg, no murmurs, rubs, or gallops.  Respiratory: Clear to auscultation bilaterally. GI: Soft, nontender, non-distended  [id number] edema; No deformity. Neuro:  Nonfocal  Psych: Normal affect    Labs    High Sensitivity Troponin:  No results for input(s): TROPONINIHS in the last 720 hours.    Chemistry Recent Labs  Lab 07/04/19 0322 07/07/19 0258 07/08/19 0744  NA 139 137 135  K 4.0 3.8 3.9  CL 106 99 92*  CO2 27 29 29   GLUCOSE 105* 126* 177*  BUN 11 12 12   CREATININE 0.68 0.69 0.79  CALCIUM 7.5* 8.8* 9.1  GFRNONAA >60 >60 >60  GFRAA >60 >60 >60  ANIONGAP 6 9 14      Hematology Recent Labs  Lab 07/03/19 1606 07/04/19 0322 07/06/19 0206  WBC 11.0* 11.3* 8.5  RBC 3.10* 3.17* 3.01*  HGB 9.1* 9.3* 8.7*  HCT 28.7* 29.3* 27.7*  MCV 92.6 92.4 92.0  MCH 29.4 29.3 28.9  MCHC 31.7 31.7 31.4  RDW 14.6 14.7 14.9  PLT 142* 152 227    BNPNo results for input(s): BNP, PROBNP in the last 168 hours.   DDimer No results for input(s): DDIMER in the last 168 hours.   Radiology    No results found.  Cardiac Studies   Echo 04/23/19 1. Left ventricular ejection fraction, by estimation, is 65 to 70%. The  left ventricle has  normal function. The left ventrical has no regional  wall motion abnormalities. Left ventricular diastolic parameters are  consistent with Grade II diastolic  dysfunction (pseudonormalization).  2. Right ventricular systolic function is normal. The right ventricular  size is normal. There is normal pulmonary artery systolic pressure.  3. Left atrial size was mildly dilated. No left atrial/left atrial  appendage thrombus was detected.  4. The mitral valve is normal in structure and function. trivial mitral  valve regurgitation. No evidence of mitral stenosis.  5. Progression of AS noted. August 2020: peak/mean gradient39/24 mmHg,  AVA 0.66, DI 0.27. Now Peak/mean gradient - 60/36 mmHg, AVA 0.73, DI -  0.23. The aortic valve is grossly normal. Aortic valve regurgitation is  mild. Moderate to severe aortic valve  stenosis.  6. The inferior vena cava is normal in size with greater than 50%  respiratory variability, suggesting right atrial  pressure of 3 mmHg.    Echo intraoperative [id number] Complications: No known complications during this procedure.  POST-OP IMPRESSIONS  - Left Ventricle: The left ventricle is unchanged from pre-bypass.  - Aorta: The aorta appears unchanged from pre-bypass.  - Left Atrial Appendage: A Pro 2 Clip was placed.  - Aortic Valve: No stenosis present. A bioprosthetic valve was placed,  leaflets  are not freely mobile Manufactured by; Resilia Size; 71mm. There is no  regurgitation. The gradient recorded across the prosthetic valve is within  the  expected range. No perivalvular leak noted.  - Mitral Valve: The mitral valve appears unchanged from pre-bypass.  - Tricuspid Valve: The tricuspid valve appears unchanged from pre-bypass.  - Pericardium: The pericardium appears unchanged from pre-bypass.   Patient Profile     64 y.o. female with a hx of aortic stenosis, paroxysmal afib on Eliquis, HTN, HLD, and cardiomyopathy with normalization of EF who is being seen today for the evaluation of afib at the request of dr. Cyndia Bent.  Assessment & Plan     Afib RVR with H/o of paroxysmal Afib - s/p clipping of atrial appendage 4/22 previously on Eliquis - Patient went into afib RVR 4/28 with rates up into the 180s and felt overall terrible. She was started on amio drip and was given some IV Lopressor.  - Unable to start IV Dilt due to hypotension. Digoxin was started - Prior to admission she was on Coreg 25 mg BID, Cardizem 30 mg qH.  - she is on Coreg 3.125mg  BID - K+ 3.9 goal >4 - Mag 1.0 goal >2 - TSH 1.3 - Pateint remain in afib with rates in the 120-130s. She is asymptomatic - Plan for DCCV today  AS s/p AVR - Post Intraop echo showed no AI and normal gradients  DM2 - new dx - plan to follow-up as outpatient  For questions or updates, please contact [facility] Please consult [email or web address] for contact info under        Signed, Cadence Ninfa Meeker, PA-C  07/10/2019, 7:40 AM     I have seen and examined the patient along with Cadence Ninfa Meeker, PA-C .  I have reviewed the chart, notes and new data.  I agree with PA/NP's note.  Key new complaints: Unaware of palpitations and does not have any chest pain.  Does not have shortness of breath at rest, still a little weak with walking. Key examination changes: Remains in atrial fibrillation with rapid ventricular response in the 140s despite intravenous amiodarone, carvedilol and digoxin. Key new findings / data: Atrial fibrillation with RVR on telemetry.  PLAN: Discussed option for cardioversion with or without transesophageal echocardiogram.   She did not have left atrial thrombus during the intraoperative TEE on 07/02/2019.  She has been in sinus rhythm for most of the postoperative period. Her anticoagulation was started promptly within a few hours of arrhythmia diagnosis.  She has had a left atrial appendage exclusion.  I believe the risk of thromboembolic stroke is low.  Under the circumstances, I do not think transesophageal echocardiography is necessary.  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  She would rather not have a transesophageal echocardiogram.  Will discuss with Dr. Nechama Guard as well, who will be performing the procedure, but I think we can proceed directly to cardioversion. If the cardioversion unsuccessful, plan to transition to oral amiodarone.  Current blood pressure does not allow an increase in the dose of carvedilol.  Sanda Klein, MD, [place] [phone] 07/10/2019, 10:33 AM

## 2019-07-10 NOTE — Anesthesia Procedure Notes (Signed)
Procedure Name: General with mask airway Date/Time: 07/10/2019 12:09 PM Performed by: Harden Mo, CRNA Pre-anesthesia Checklist: Patient identified, Emergency Drugs available, Suction available and Patient being monitored Patient Re-evaluated:Patient Re-evaluated prior to induction Oxygen Delivery Method: Ambu bag Preoxygenation: Pre-oxygenation with 100% oxygen Induction Type: IV induction Placement Confirmation: positive ETCO2 and breath sounds checked- equal and bilateral Dental Injury: Teeth and Oropharynx as per pre-operative assessment

## 2019-07-10 NOTE — Anesthesia Postprocedure Evaluation (Signed)
Anesthesia Post Note  Patient: Lindsay Guerrero  Procedure(s) Performed: CARDIOVERSION (N/A )     Patient location during evaluation: PACU Anesthesia Type: General Level of consciousness: awake and alert Pain management: pain level controlled Vital Signs Assessment: post-procedure vital signs reviewed and stable Respiratory status: spontaneous breathing, nonlabored ventilation, respiratory function stable and patient connected to nasal cannula oxygen Cardiovascular status: blood pressure returned to baseline and stable Postop Assessment: no apparent nausea or vomiting Anesthetic complications: no    Last Vitals:  Vitals:   07/10/19 1139 07/10/19 1227  BP: 111/77 (!) 89/75  Pulse:    Resp: (!) 21 18  Temp: (!) 36.4 C   SpO2: 95%     Last Pain:  Vitals:   07/10/19 1227  TempSrc:   PainSc: 0-No pain                 Sadao Weyer

## 2019-07-10 NOTE — CV Procedure (Signed)
Procedure:   DCCV  Indication:  Symptomatic atrial fibrillation  Procedure Note:  The patient signed informed consent.  Patient had intraoperative TEE on 07/02/19 prior to AVR, no LAA thrombus was seen, and LAA was clipped.  Developed AF with RVR on 4/28 and was started on Eliquis, has received 3 doses.  Anesthesia was administered by Dr. Ermalene Postin and Garrison [place], CRNA.  Adequate airway was maintained throughout and vital followed per protocol.  They were cardioverted x 1 with 200J of biphasic synchronized energy.  They converted to NSR with rate 80s.  There were no apparent complications.  The patient had normal neuro status and respiratory status post procedure with vitals stable as recorded elsewhere.    Follow up:  They will continue on current medical therapy and follow up with cardiology as scheduled.  Oswaldo Milian, MD 07/10/2019 12:22 PM

## 2019-07-10 NOTE — Anesthesia Preprocedure Evaluation (Addendum)
Anesthesia Evaluation  Patient identified by MRN, date of birth, ID band Patient awake    Reviewed: Allergy & Precautions, NPO status , Patient's Chart, lab work & pertinent test results  History of Anesthesia Complications Negative for: history of anesthetic complications  Airway Mallampati: II  TM Distance: >3 FB Neck ROM: Full    Dental  (+) Dental Advisory Given   Pulmonary asthma , sleep apnea ,    Pulmonary exam normal        Cardiovascular hypertension, Pt. on medications and Pt. on home beta blockers + dysrhythmias Atrial Fibrillation + Valvular Problems/Murmurs MR  Rhythm:Irregular Rate:Normal   S/p AVR 07/02/19    Neuro/Psych PSYCHIATRIC DISORDERS Anxiety Depression negative neurological ROS     GI/Hepatic hiatal hernia, (+) Hepatitis -, Cytomegalovirus  Endo/Other  diabetes  Renal/GU negative Renal ROS     Musculoskeletal negative musculoskeletal ROS (+)   Abdominal   Peds  Hematology  (+) anemia ,  On eliquis    Anesthesia Other Findings Covid neg 4/20   Reproductive/Obstetrics                            Anesthesia Physical Anesthesia Plan  ASA: III  Anesthesia Plan: General   Post-op Pain Management:    Induction: Intravenous  PONV Risk Score and Plan: 3 and Treatment may vary due to age or medical condition and Propofol infusion  Airway Management Planned: Mask and Natural Airway  Additional Equipment: None  Intra-op Plan:   Post-operative Plan:   Informed Consent: I have reviewed the patients History and Physical, chart, labs and discussed the procedure including the risks, benefits and alternatives for the proposed anesthesia with the patient or authorized representative who has indicated his/her understanding and acceptance.       Plan Discussed with: CRNA and Anesthesiologist  Anesthesia Plan Comments:        Anesthesia Quick Evaluation

## 2019-07-10 NOTE — Progress Notes (Signed)
CARDIAC REHAB PHASE I   PRE:  Rate/Rhythm: 82 SR    BP: sitting 103/74    SaO2: 98 RA  MODE:  Ambulation: 940 ft   POST:  Rate/Rhythm: 105 ST    BP: sitting 100/72     SaO2: 97 RA  Pt feeling well now in NSR. Able to move out of bed and ambulate independently. Rest x1 for SOB. Increased distance. Happy to ambulate. Return to bed, watching movie with granddaughter. Encouraged more walking. No questions regarding education. [place], ACSM 07/10/2019 2:45 PM

## 2019-07-10 NOTE — Progress Notes (Addendum)
Progress Note  Patient Name: Lindsay Guerrero Date of Encounter: 07/10/2019  Primary Cardiologist: Jenne Campus, MD   Subjective   Patient is still in rapid afib. Plan for cardioversion today. Patient had a good night. No CP, sob, palpitations.   Inpatient Medications    Scheduled Meds: . apixaban  5 mg Oral BID  . buPROPion  300 mg Oral q morning - 10a  . carvedilol  3.125 mg Oral BID WC  . digoxin  0.125 mg Oral Daily  . docusate sodium  200 mg Oral Daily  . insulin aspart  0-24 Units Subcutaneous TID AC & HS  . mouth rinse  15 mL Mouth Rinse BID  . pantoprazole  40 mg Oral QAC breakfast  . potassium chloride  20 mEq Oral BID  . sodium chloride flush  3 mL Intravenous Q12H   Continuous Infusions: . sodium chloride    . amiodarone 30 mg/hr (07/10/19 0645)   PRN Meds: sodium chloride, acetaminophen, gabapentin, levalbuterol, lidocaine, LORazepam, ondansetron **OR** ondansetron (ZOFRAN) IV, oxyCODONE, pramipexole, sodium chloride flush, traMADol   Vital Signs    Vitals:   07/10/19 0400 07/10/19 0500 07/10/19 0635 07/10/19 0642  BP: 91/70 97/62 (!) 89/69   Pulse:   (!) 101   Resp: (!) 24 19 (!) 27 18  Temp:   98.5 F (36.9 C)   TempSrc:   Oral   SpO2:   94%   Weight:    62.5 kg  Height:        Intake/Output Summary (Last 24 hours) at 07/10/2019 0740 Last data filed at 07/10/2019 0645 Gross per 24 hour  Intake 1110.63 ml  Output 2200 ml  Net -1089.37 ml   Last 3 Weights 07/10/2019 07/09/2019 07/07/2019  Weight (lbs) 137 lb 12.6 oz 141 lb 8.6 oz 147 lb  Weight (kg) 62.5 kg 64.2 kg 66.679 kg      Telemetry    Afib HR 120-130, PVCs - Personally Reviewed  ECG    No new - Personally Reviewed  Physical Exam   GEN: No acute distress.   Neck: No JVD Cardiac: Irreg Irreg, no murmurs, rubs, or gallops.  Respiratory: Clear to auscultation bilaterally. GI: Soft, nontender, non-distended  [id number] edema; No deformity. Neuro:  Nonfocal  Psych: Normal affect    Labs    High Sensitivity Troponin:  No results for input(s): TROPONINIHS in the last 720 hours.    Chemistry Recent Labs  Lab 07/04/19 0322 07/07/19 0258 07/08/19 0744  NA 139 137 135  K 4.0 3.8 3.9  CL 106 99 92*  CO2 27 29 29   GLUCOSE 105* 126* 177*  BUN 11 12 12   CREATININE 0.68 0.69 0.79  CALCIUM 7.5* 8.8* 9.1  GFRNONAA >60 >60 >60  GFRAA >60 >60 >60  ANIONGAP 6 9 14      Hematology Recent Labs  Lab 07/03/19 1606 07/04/19 0322 07/06/19 0206  WBC 11.0* 11.3* 8.5  RBC 3.10* 3.17* 3.01*  HGB 9.1* 9.3* 8.7*  HCT 28.7* 29.3* 27.7*  MCV 92.6 92.4 92.0  MCH 29.4 29.3 28.9  MCHC 31.7 31.7 31.4  RDW 14.6 14.7 14.9  PLT 142* 152 227    BNPNo results for input(s): BNP, PROBNP in the last 168 hours.   DDimer No results for input(s): DDIMER in the last 168 hours.   Radiology    No results found.  Cardiac Studies   Echo 04/23/19 1. Left ventricular ejection fraction, by estimation, is 65 to 70%. The  left ventricle has  normal function. The left ventrical has no regional  wall motion abnormalities. Left ventricular diastolic parameters are  consistent with Grade II diastolic  dysfunction (pseudonormalization).  2. Right ventricular systolic function is normal. The right ventricular  size is normal. There is normal pulmonary artery systolic pressure.  3. Left atrial size was mildly dilated. No left atrial/left atrial  appendage thrombus was detected.  4. The mitral valve is normal in structure and function. trivial mitral  valve regurgitation. No evidence of mitral stenosis.  5. Progression of AS noted. August 2020: peak/mean gradient39/24 mmHg,  AVA 0.66, DI 0.27. Now Peak/mean gradient - 60/36 mmHg, AVA 0.73, DI -  0.23. The aortic valve is grossly normal. Aortic valve regurgitation is  mild. Moderate to severe aortic valve  stenosis.  6. The inferior vena cava is normal in size with greater than 50%  respiratory variability, suggesting right atrial  pressure of 3 mmHg.    Echo intraoperative [id number] Complications: No known complications during this procedure.  POST-OP IMPRESSIONS  - Left Ventricle: The left ventricle is unchanged from pre-bypass.  - Aorta: The aorta appears unchanged from pre-bypass.  - Left Atrial Appendage: A Pro 2 Clip was placed.  - Aortic Valve: No stenosis present. A bioprosthetic valve was placed,  leaflets  are not freely mobile Manufactured by; Resilia Size; 32mm. There is no  regurgitation. The gradient recorded across the prosthetic valve is within  the  expected range. No perivalvular leak noted.  - Mitral Valve: The mitral valve appears unchanged from pre-bypass.  - Tricuspid Valve: The tricuspid valve appears unchanged from pre-bypass.  - Pericardium: The pericardium appears unchanged from pre-bypass.   Patient Profile     64 y.o. female with a hx of aortic stenosis, paroxysmal afib on Eliquis, HTN, HLD, and cardiomyopathy with normalization of EF who is being seen today for the evaluation of afib at the request of dr. Cyndia Bent.  Assessment & Plan     Afib RVR with H/o of paroxysmal Afib - s/p clipping of atrial appendage 4/22 previously on Eliquis - Patient went into afib RVR 4/28 with rates up into the 180s and felt overall terrible. She was started on amio drip and was given some IV Lopressor.  - Unable to start IV Dilt due to hypotension. Digoxin was started - Prior to admission she was on Coreg 25 mg BID, Cardizem 30 mg qH.  - she is on Coreg 3.125mg  BID - K+ 3.9 goal >4 - Mag 1.0 goal >2 - TSH 1.3 - Pateint remain in afib with rates in the 120-130s. She is asymptomatic - Plan for DCCV today  AS s/p AVR - Post Intraop echo showed no AI and normal gradients  DM2 - new dx - plan to follow-up as outpatient  For questions or updates, please contact [facility] Please consult [email or web address] for contact info under        Signed, Cadence Ninfa Meeker, PA-C  07/10/2019, 7:40 AM     I have seen and examined the patient along with Cadence Ninfa Meeker, PA-C .  I have reviewed the chart, notes and new data.  I agree with PA/NP's note.  Key new complaints: Unaware of palpitations and does not have any chest pain.  Does not have shortness of breath at rest, still a little weak with walking. Key examination changes: Remains in atrial fibrillation with rapid ventricular response in the 140s despite intravenous amiodarone, carvedilol and digoxin. Key new findings / data: Atrial fibrillation with RVR on telemetry.  PLAN: Discussed option for cardioversion with or without transesophageal echocardiogram.   She did not have left atrial thrombus during the intraoperative TEE on 07/02/2019.  She has been in sinus rhythm for most of the postoperative period. Her anticoagulation was started promptly within a few hours of arrhythmia diagnosis.  She has had a left atrial appendage exclusion.  I believe the risk of thromboembolic stroke is low.  Under the circumstances, I do not think transesophageal echocardiography is necessary.  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  She would rather not have a transesophageal echocardiogram.  Will discuss with Dr. Nechama Guard as well, who will be performing the procedure, but I think we can proceed directly to cardioversion. If the cardioversion unsuccessful, plan to transition to oral amiodarone.  Current blood pressure does not allow an increase in the dose of carvedilol.  Sanda Klein, MD, [place] [phone] 07/10/2019, 10:33 AM

## 2019-07-11 DIAGNOSIS — I350 Nonrheumatic aortic (valve) stenosis: Secondary | ICD-10-CM

## 2019-07-11 LAB — BASIC METABOLIC PANEL
Anion gap: 10 (ref 5–15)
BUN: 17 mg/dL (ref 8–23)
CO2: 27 mmol/L (ref 22–32)
Calcium: 8.5 mg/dL — ABNORMAL LOW (ref 8.9–10.3)
Chloride: 97 mmol/L — ABNORMAL LOW (ref 98–111)
Creatinine, Ser: 0.95 mg/dL (ref 0.44–1.00)
GFR calc Af Amer: >60 mL/min (ref >60)
GFR calc non Af Amer: >60 mL/min (ref >60)
Glucose, Bld: 135 mg/dL — ABNORMAL HIGH (ref 70–99)
Potassium: 3.8 mmol/L (ref 3.5–5.1)
Sodium: 134 mmol/L — ABNORMAL LOW (ref 135–145)

## 2019-07-11 LAB — GLUCOSE, CAPILLARY
Glucose-Capillary: 115 mg/dL — ABNORMAL HIGH (ref 70–99)
Glucose-Capillary: 122 mg/dL — ABNORMAL HIGH (ref 70–99)
Glucose-Capillary: 152 mg/dL — ABNORMAL HIGH (ref 70–99)
Glucose-Capillary: 125 mg/dL — ABNORMAL HIGH (ref 70–99)
Glucose-Capillary: 88 mg/dL (ref 70–99)
Glucose-Capillary: 147 mg/dL — ABNORMAL HIGH (ref 70–99)

## 2019-07-11 LAB — MAGNESIUM: Magnesium: 1.8 mg/dL (ref 1.7–2.4)

## 2019-07-11 MED ORDER — AMIODARONE HCL 200 MG PO TABS: 200.0000 mg | ORAL_TABLET | Freq: Two times a day (BID) | ORAL | Status: DC

## 2019-07-11 MED ORDER — AMIODARONE HCL 200 MG PO TABS
400.0000 mg | ORAL_TABLET | Freq: Two times a day (BID) | ORAL | Status: DC
  Administered 2019-07-11 – 2019-07-12 (×3): 400 mg via ORAL
  Filled 2019-07-11 (×3): qty 2

## 2019-07-11 MED ORDER — AMIODARONE HCL 200 MG PO TABS: 200.0000 mg | ORAL_TABLET | Freq: Every day | ORAL | Status: DC

## 2019-07-11 MED ORDER — POTASSIUM CHLORIDE CRYS ER 20 MEQ PO TBCR
40.0000 meq | EXTENDED_RELEASE_TABLET | Freq: Once | ORAL | Status: AC
  Administered 2019-07-11: 10:00:00 40 meq via ORAL
  Filled 2019-07-11: qty 2

## 2019-07-11 NOTE — Progress Notes (Addendum)
Progress Note  Patient Name: Lindsay Guerrero Date of Encounter: 07/11/2019  Primary Cardiologist: Jenne Campus, MD   Subjective   Patient is feeling better today, now in SR. No chest pain or SOB.  Inpatient Medications    Scheduled Meds: . apixaban  5 mg Oral BID  . buPROPion  300 mg Oral q morning - 10a  . carvedilol  3.125 mg Oral BID WC  . digoxin  0.125 mg Oral Daily  . docusate sodium  200 mg Oral Daily  . insulin aspart  0-24 Units Subcutaneous TID AC & HS  . mouth rinse  15 mL Mouth Rinse BID  . pantoprazole  40 mg Oral QAC breakfast  . potassium chloride  20 mEq Oral BID  . sodium chloride flush  3 mL Intravenous Q12H   Continuous Infusions: . sodium chloride    . amiodarone 30 mg/hr (07/11/19 0141)   PRN Meds: sodium chloride, acetaminophen, gabapentin, levalbuterol, lidocaine, LORazepam, ondansetron **OR** ondansetron (ZOFRAN) IV, oxyCODONE, pramipexole, sodium chloride flush, traMADol   Vital Signs    Vitals:   07/10/19 1926 07/10/19 2348 07/11/19 0337 07/11/19 0753  BP: (!) 95/50 97/60 (!) 91/53 98/67  Pulse: 89 87 87 85  Resp: 16 15 14 18   Temp: 98.3 F (36.8 C) 98.7 F (37.1 C) 98.4 F (36.9 C) 98.4 F (36.9 C)  TempSrc: Oral Oral Oral Oral  SpO2: 94% 96% 92% 91%  Weight:   62.7 kg   Height:        Intake/Output Summary (Last 24 hours) at 07/11/2019 0818 Last data filed at 07/10/2019 1300 Gross per 24 hour  Intake 240 ml  Output 200 ml  Net 40 ml   Last 3 Weights 07/11/2019 07/10/2019 07/10/2019  Weight (lbs) 138 lb 3.7 oz 137 lb 12.6 oz 137 lb 12.6 oz  Weight (kg) 62.7 kg 62.5 kg 62.5 kg      Telemetry    Afib HR 120-130, PVCs - Personally Reviewed  ECG    No new - Personally Reviewed  Physical Exam   GEN: No acute distress.   Neck: No JVD Cardiac: Irreg Irreg, no murmurs, rubs, or gallops.  Respiratory: Clear to auscultation bilaterally. GI: Soft, nontender, non-distended  [id number] edema; No deformity. Neuro:  Nonfocal    Psych: Normal affect   Labs    High Sensitivity Troponin:  No results for input(s): TROPONINIHS in the last 720 hours.    Chemistry Recent Labs  Lab 07/08/19 0744 07/10/19 0823 07/11/19 0239  NA 135 137 134*  K 3.9 4.0 3.8  CL 92* 97* 97*  CO2 29 30 27   GLUCOSE 177* 129* 135*  BUN 12 18 17   CREATININE 0.79 0.87 0.95  CALCIUM 9.1 9.0 8.5*  GFRNONAA >60 >60 >60  GFRAA >60 >60 >60  ANIONGAP 14 10 10      Hematology Recent Labs  Lab 07/06/19 0206  WBC 8.5  RBC 3.01*  HGB 8.7*  HCT 27.7*  MCV 92.0  MCH 28.9  MCHC 31.4  RDW 14.9  PLT 227    BNPNo results for input(s): BNP, PROBNP in the last 168 hours.   DDimer No results for input(s): DDIMER in the last 168 hours.   Radiology    No results found.  Cardiac Studies   Echo 04/23/19 1. Left ventricular ejection fraction, by estimation, is 65 to 70%. The  left ventricle has normal function. The left ventrical has no regional  wall motion abnormalities. Left ventricular diastolic parameters are  consistent  with Grade II diastolic  dysfunction (pseudonormalization).  2. Right ventricular systolic function is normal. The right ventricular  size is normal. There is normal pulmonary artery systolic pressure.  3. Left atrial size was mildly dilated. No left atrial/left atrial  appendage thrombus was detected.  4. The mitral valve is normal in structure and function. trivial mitral  valve regurgitation. No evidence of mitral stenosis.  5. Progression of AS noted. August 2020: peak/mean gradient39/24 mmHg,  AVA 0.66, DI 0.27. Now Peak/mean gradient - 60/36 mmHg, AVA 0.73, DI -  0.23. The aortic valve is grossly normal. Aortic valve regurgitation is  mild. Moderate to severe aortic valve  stenosis.  6. The inferior vena cava is normal in size with greater than 50%  respiratory variability, suggesting right atrial pressure of 3 mmHg.    Echo intraoperative [id number] Complications: No known complications  during this procedure.  POST-OP IMPRESSIONS  - Left Ventricle: The left ventricle is unchanged from pre-bypass.  - Aorta: The aorta appears unchanged from pre-bypass.  - Left Atrial Appendage: A Pro 2 Clip was placed.  - Aortic Valve: No stenosis present. A bioprosthetic valve was placed,  leaflets  are not freely mobile Manufactured by; Resilia Size; 3mm. There is no  regurgitation. The gradient recorded across the prosthetic valve is within  the  expected range. No perivalvular leak noted.  - Mitral Valve: The mitral valve appears unchanged from pre-bypass.  - Tricuspid Valve: The tricuspid valve appears unchanged from pre-bypass.  - Pericardium: The pericardium appears unchanged from pre-bypass.   Patient Profile     64 y.o. female with a hx of aortic stenosis, paroxysmal afib on Eliquis, HTN, HLD, and cardiomyopathy with normalization of EF who is being seen today for the evaluation of afib at the request of dr. Cyndia Bent.  Assessment & Plan     Afib RVR with H/o of paroxysmal Afib - s/p clipping of atrial appendage 4/22 previously on Eliquis - Patient went into afib RVR 4/28 with rates up into the 180s and felt overall terrible. She was started on amio drip and was given some IV Lopressor.  - she underwent a successful DCCV yesterday and remains in SR with rates in 70-80'. She feels better.  - Prior to admission she was on Coreg 25 mg BID, Cardizem 30 mg qH.  - she is on Coreg 3.125mg  BID, d/c digoxin, change amiodarone to PO, replace K   AS s/p AVR - Post Intraop echo showed no AI and normal gradients  DM2 - new dx - plan to follow-up as outpatient   For questions or updates, please contact [facility] Please consult [email or web address] for contact info under    Ena Dawley, MD 07/11/2019

## 2019-07-11 NOTE — Plan of Care (Signed)
  Problem: Clinical Measurements: Goal: Ability to maintain clinical measurements within normal limits will improve Outcome: Progressing   Problem: Clinical Measurements: Goal: Respiratory complications will improve Outcome: Progressing   Problem: Clinical Measurements: Goal: Cardiovascular complication will be avoided Outcome: Progressing   

## 2019-07-11 NOTE — Progress Notes (Addendum)
      [address]       [place] [address]             [phone]      1 Day Post-Op Procedure(s) (LRB): CARDIOVERSION (N/A) Subjective: Feels okay this morning and she wants to go home.   Objective: Vital signs in last 24 hours: Temp:  [97.5 F (36.4 C)-98.7 F (37.1 C)] 98.4 F (36.9 C) (05/01 0753) Pulse Rate:  [78-144] 85 (05/01 0753) Cardiac Rhythm: Normal sinus rhythm (04/30 1900) Resp:  [14-23] 18 (05/01 0753) BP: (87-111)/(50-84) 98/67 (05/01 0753) SpO2:  [91 %-96 %] 91 % (05/01 0753) Weight:  [62.5 kg-62.7 kg] 62.7 kg (05/01 0337)     Intake/Output from previous day: 04/30 0701 - 05/01 0700 In: 240 [P.O.:240] Out: 200 [Urine:200] Intake/Output this shift: No intake/output data recorded.  General appearance: alert, cooperative and no distress Heart: regular rate and rhythm, S1, S2 normal, no murmur, click, rub or gallop Lungs: clear to auscultation bilaterally Abdomen: soft, non-tender; bowel sounds normal; no masses,  no organomegaly Extremities: extremities normal, atraumatic, no cyanosis or edema Wound: clean and dry  Lab Results: No results for input(s): WBC, HGB, HCT, PLT in the last 72 hours. BMET:  Recent Labs    07/10/19 0823 07/11/19 0239  NA 137 134*  K 4.0 3.8  CL 97* 97*  CO2 30 27  GLUCOSE 129* 135*  BUN 18 17  CREATININE 0.87 0.95  CALCIUM 9.0 8.5*    PT/INR: No results for input(s): LABPROT, INR in the last 72 hours. ABG    Component Value Date/Time   PHART 7.362 07/03/2019 0433   HCO3 26.6 07/03/2019 0433   TCO2 28 07/03/2019 0433   ACIDBASEDEF 2.0 07/02/2019 1950   O2SAT 99.0 07/03/2019 0433   CBG (last 3)  Recent Labs    07/10/19 2213 07/11/19 0608 07/11/19 0824  GLUCAP 115* 122* 152*    Assessment/Plan: S/P Procedure(s) (LRB): CARDIOVERSION (N/A)  1. CV-DCCV yesterday. Turn off IV Amio-continue oral Amio 2. Pulm-tolerating room air with good oxygen saturation. No recent CXR. Previous CXR  showed bilateral pleural effusions.  3. Renal-creatinine 0.95, electrolytes okay.  4. H and H 8.7/27.7, expected acute blood loss anemia 5. Endo-blood glucose well controlled.  Plan: Now in NSR but Amio IV still going? Will d/c and continue on oral Amio. Patient wants to go home but will need to be cleared by cardiology.     LOS: 9 days    Elgie Collard 07/11/2019  Agree with above Remains in sinus Will watch for 24 more hrs  Benard Minturn O Jahmani Staup

## 2019-07-12 ENCOUNTER — Inpatient Hospital Stay (HOSPITAL_COMMUNITY): Payer: Commercial Managed Care - PPO

## 2019-07-12 ENCOUNTER — Other Ambulatory Visit: Payer: Self-pay | Admitting: Physician Assistant

## 2019-07-12 LAB — GLUCOSE, CAPILLARY: Glucose-Capillary: 143 mg/dL — ABNORMAL HIGH (ref 70–99)

## 2019-07-12 MED ORDER — CARVEDILOL 3.125 MG PO TABS: 3.1250 mg | ORAL_TABLET | Freq: Two times a day (BID) | ORAL | 1 refills | Status: DC

## 2019-07-12 MED ORDER — OXYCODONE HCL 5 MG PO TABS: 5.0000 mg | ORAL_TABLET | Freq: Four times a day (QID) | ORAL | 0 refills | Status: DC | PRN

## 2019-07-12 MED ORDER — AMIODARONE HCL 200 MG PO TABS: ORAL_TABLET | ORAL | 1 refills | Status: DC

## 2019-07-12 NOTE — Discharge Instructions (Signed)
Diabetes Mellitus and Nutrition, Adult  When you have diabetes (diabetes mellitus), it is very important to have healthy eating habits because your blood sugar (glucose) levels are greatly affected by what you eat and drink. Eating healthy foods in the appropriate amounts, at about the same times every day, can help you:  Control your blood glucose.  Lower your risk of heart disease.  Improve your blood pressure.  Reach or maintain a healthy weight. Every person with diabetes is different, and each person has different needs for a meal plan. Your health care provider may recommend that you work with a diet and nutrition specialist (dietitian) to make a meal plan that is best for you. Your meal plan may vary depending on factors such as:  The calories you need.  The medicines you take.  Your weight.  Your blood glucose, blood pressure, and cholesterol levels.  Your activity level.  Other health conditions you have, such as heart or kidney disease. How do carbohydrates affect me? Carbohydrates, also called carbs, affect your blood glucose level more than any other type of food. Eating carbs naturally raises the amount of glucose in your blood. Carb counting is a method for keeping track of how many carbs you eat. Counting carbs is important to keep your blood glucose at a healthy level, especially if you use insulin or take certain oral diabetes medicines. It is important to know how many carbs you can safely have in each meal. This is different for every person. Your dietitian can help you calculate how many carbs you should have at each meal and for each snack. Foods that contain carbs include:  Bread, cereal, rice, pasta, and crackers.  Potatoes and corn.  Peas, beans, and lentils.  Milk and yogurt.  Fruit and juice.  Desserts, such as cakes, cookies, ice cream, and candy. How does alcohol affect me? Alcohol can cause a sudden decrease in blood glucose (hypoglycemia),  especially if you use insulin or take certain oral diabetes medicines. Hypoglycemia can be a life-threatening condition. Symptoms of hypoglycemia (sleepiness, dizziness, and confusion) are similar to symptoms of having too much alcohol. If your health care provider says that alcohol is safe for you, follow these guidelines:  Limit alcohol intake to no more than 1 drink per day for nonpregnant women and 2 drinks per day for men. One drink equals 12 oz of beer, 5 oz of wine, or 1 oz of hard liquor.  Do not drink on an empty stomach.  Keep yourself hydrated with water, diet soda, or unsweetened iced tea.  Keep in mind that regular soda, juice, and other mixers may contain a lot of sugar and must be counted as carbs. What are tips for following this plan?  Reading food labels  Start by checking the serving size on the "Nutrition Facts" label of packaged foods and drinks. The amount of calories, carbs, fats, and other nutrients listed on the label is based on one serving of the item. Many items contain more than one serving per package.  Check the total grams (g) of carbs in one serving. You can calculate the number of servings of carbs in one serving by dividing the total carbs by 15. For example, if a food has 30 g of total carbs, it would be equal to 2 servings of carbs.  Check the number of grams (g) of saturated and trans fats in one serving. Choose foods that have low or no amount of these fats.  Check the number  of milligrams (mg) of salt (sodium) in one serving. Most people should limit total sodium intake to less than 2,300 mg per day.  Always check the nutrition information of foods labeled as "low-fat" or "nonfat". These foods may be higher in added sugar or refined carbs and should be avoided.  Talk to your dietitian to identify your daily goals for nutrients listed on the label. Shopping  Avoid buying canned, premade, or processed foods. These foods tend to be high in fat, sodium,  and added sugar.  Shop around the outside edge of the grocery store. This includes fresh fruits and vegetables, bulk grains, fresh meats, and fresh dairy. Cooking  Use low-heat cooking methods, such as baking, instead of high-heat cooking methods like deep frying.  Cook using healthy oils, such as olive, canola, or sunflower oil.  Avoid cooking with butter, cream, or high-fat meats. Meal planning  Eat meals and snacks regularly, preferably at the same times every day. Avoid going long periods of time without eating.  Eat foods high in fiber, such as fresh fruits, vegetables, beans, and whole grains. Talk to your dietitian about how many servings of carbs you can eat at each meal.  Eat 4-6 ounces (oz) of lean protein each day, such as lean meat, chicken, fish, eggs, or tofu. One oz of lean protein is equal to: ? 1 oz of meat, chicken, or fish. ? 1 egg. ?  cup of tofu.  Eat some foods each day that contain healthy fats, such as avocado, nuts, seeds, and fish. Lifestyle  Check your blood glucose regularly.  Exercise regularly as told by your health care provider. This may include: ? 150 minutes of moderate-intensity or vigorous-intensity exercise each week. This could be brisk walking, biking, or water aerobics. ? Stretching and doing strength exercises, such as yoga or weightlifting, at least 2 times a week.  Take medicines as told by your health care provider.  Do not use any products that contain nicotine or tobacco, such as cigarettes and e-cigarettes. If you need help quitting, ask your health care provider.  Work with a Social worker or diabetes educator to identify strategies to manage stress and any emotional and social challenges. Questions to ask a health care provider  Do I need to meet with a diabetes educator?  Do I need to meet with a dietitian?  What number can I call if I have questions?  When are the best times to check my blood glucose? Where to find more  information:  American Diabetes Association: diabetes.org  Academy of Nutrition and Dietetics: www.eatright.[employer] of Diabetes and Digestive and Kidney Diseases (NIH): [employer] Summary  A healthy meal plan will help you control your blood glucose and maintain a healthy lifestyle.  Working with a diet and nutrition specialist (dietitian) can help you make a meal plan that is best for you.  Keep in mind that carbohydrates (carbs) and alcohol have immediate effects on your blood glucose levels. It is important to count carbs and to use alcohol carefully. This information is not intended to replace advice given to you by your health care provider. Make sure you discuss any questions you have with your health care provider. Document Revised: 02/08/2017 Document Reviewed: 04/02/2016 Elsevier Patient Education  [place]. Discharge Instructions:  1. You may shower, please wash incisions daily with soap and water and keep dry.  If you wish to cover wounds with dressing you may do so but please keep clean and change daily.  No tub baths or swimming until incisions have completely healed.  If your incisions become red or develop any drainage please call our office at [phone]  2. No Driving until cleared by Dr. Vivi Martens office and you are no longer using narcotic pain medications  3. Monitor your weight daily.. Please use the same scale and weigh at same time... If you gain 5-10 lbs in 48 hours with associated lower extremity swelling, please contact our office at [phone]  4. Fever of 101.5 for at least 24 hours with no source, please contact our office at [phone]  5. Activity- up as tolerated, please walk at least 3 times per day.  Avoid strenuous activity, no lifting, pushing, or pulling with your arms over 8-10 lbs for a minimum of 6 weeks  6. If any questions or concerns arise, please do not hesitate to contact our office at [phone]

## 2019-07-12 NOTE — Progress Notes (Signed)
Progress Note  Patient Name: Lindsay Guerrero Date of Encounter: 07/12/2019  Primary Cardiologist: Jenne Campus, MD   Subjective   Patient is feeling better today, now in SR. No chest pain or SOB.  Inpatient Medications    Scheduled Meds: . amiodarone  400 mg Oral BID   Followed by  . [START ON 07/14/2019] amiodarone  200 mg Oral BID   Followed by  . [START ON 07/17/2019] amiodarone  200 mg Oral Daily  . apixaban  5 mg Oral BID  . buPROPion  300 mg Oral q morning - 10a  . carvedilol  3.125 mg Oral BID WC  . docusate sodium  200 mg Oral Daily  . insulin aspart  0-24 Units Subcutaneous TID AC & HS  . mouth rinse  15 mL Mouth Rinse BID  . pantoprazole  40 mg Oral QAC breakfast  . potassium chloride  20 mEq Oral BID  . sodium chloride flush  3 mL Intravenous Q12H   Continuous Infusions: . sodium chloride     PRN Meds: sodium chloride, acetaminophen, gabapentin, levalbuterol, lidocaine, LORazepam, ondansetron **OR** ondansetron (ZOFRAN) IV, oxyCODONE, pramipexole, sodium chloride flush, traMADol   Vital Signs    Vitals:   07/11/19 2039 07/11/19 2319 07/12/19 0455 07/12/19 0903  BP: 95/60 100/72 105/64 114/68  Pulse: 80 88 83 84  Resp: 19 17 (!) 24 18  Temp: 98.1 F (36.7 C) 98.5 F (36.9 C) 98.1 F (36.7 C) 98.2 F (36.8 C)  TempSrc: Oral Oral Oral Oral  SpO2: 95% 100% 100% 97%  Weight:   62.6 kg   Height:        Intake/Output Summary (Last 24 hours) at 07/12/2019 0953 Last data filed at 07/11/2019 2108 Gross per 24 hour  Intake 200 ml  Output 900 ml  Net -700 ml   Last 3 Weights 07/12/2019 07/11/2019 07/10/2019  Weight (lbs) 137 lb 14.4 oz 138 lb 3.7 oz 137 lb 12.6 oz  Weight (kg) 62.551 kg 62.7 kg 62.5 kg      Telemetry    Afib HR 120-130, PVCs - Personally Reviewed  ECG    No new - Personally Reviewed  Physical Exam   GEN: No acute distress.   Neck: No JVD Cardiac: Irreg Irreg, no murmurs, rubs, or gallops.  Respiratory: Clear to auscultation  bilaterally. GI: Soft, nontender, non-distended  [id number] edema; No deformity. Neuro:  Nonfocal  Psych: Normal affect   Labs    High Sensitivity Troponin:  No results for input(s): TROPONINIHS in the last 720 hours.    Chemistry Recent Labs  Lab 07/08/19 0744 07/10/19 0823 07/11/19 0239  NA 135 137 134*  K 3.9 4.0 3.8  CL 92* 97* 97*  CO2 29 30 27   GLUCOSE 177* 129* 135*  BUN 12 18 17   CREATININE 0.79 0.87 0.95  CALCIUM 9.1 9.0 8.5*  GFRNONAA >60 >60 >60  GFRAA >60 >60 >60  ANIONGAP 14 10 10      Hematology Recent Labs  Lab 07/06/19 0206  WBC 8.5  RBC 3.01*  HGB 8.7*  HCT 27.7*  MCV 92.0  MCH 28.9  MCHC 31.4  RDW 14.9  PLT 227    BNPNo results for input(s): BNP, PROBNP in the last 168 hours.   DDimer No results for input(s): DDIMER in the last 168 hours.   Radiology    DG Chest Port 1 View  Result Date: 07/12/2019 CLINICAL DATA:  Aortic valve replacement. Evaluate pleural effusion EXAM: PORTABLE CHEST 1 VIEW COMPARISON:  07/07/2019 chest radiograph. FINDINGS: Intact sternotomy wires. Aortic valve prosthesis in place. Surgical clip overlies the left axilla. Stable cardiomediastinal silhouette with mild cardiomegaly. No pneumothorax. Trace right pleural effusion, significantly decreased. No significant left pleural effusion. No pulmonary edema. Mild bibasilar atelectasis, improved. IMPRESSION: 1. Trace right pleural effusion, significantly decreased. 2. Mild bibasilar atelectasis, improved. Electronically Signed   By: Ilona Sorrel M.D.   On: 07/12/2019 09:12    Cardiac Studies   Echo 04/23/19 1. Left ventricular ejection fraction, by estimation, is 65 to 70%. The  left ventricle has normal function. The left ventrical has no regional  wall motion abnormalities. Left ventricular diastolic parameters are  consistent with Grade II diastolic  dysfunction (pseudonormalization).  2. Right ventricular systolic function is normal. The right ventricular  size is  normal. There is normal pulmonary artery systolic pressure.  3. Left atrial size was mildly dilated. No left atrial/left atrial  appendage thrombus was detected.  4. The mitral valve is normal in structure and function. trivial mitral  valve regurgitation. No evidence of mitral stenosis.  5. Progression of AS noted. August 2020: peak/mean gradient39/24 mmHg,  AVA 0.66, DI 0.27. Now Peak/mean gradient - 60/36 mmHg, AVA 0.73, DI -  0.23. The aortic valve is grossly normal. Aortic valve regurgitation is  mild. Moderate to severe aortic valve  stenosis.  6. The inferior vena cava is normal in size with greater than 50%  respiratory variability, suggesting right atrial pressure of 3 mmHg.    Echo intraoperative [id number] Complications: No known complications during this procedure.  POST-OP IMPRESSIONS  - Left Ventricle: The left ventricle is unchanged from pre-bypass.  - Aorta: The aorta appears unchanged from pre-bypass.  - Left Atrial Appendage: A Pro 2 Clip was placed.  - Aortic Valve: No stenosis present. A bioprosthetic valve was placed,  leaflets  are not freely mobile Manufactured by; Resilia Size; 51mm. There is no  regurgitation. The gradient recorded across the prosthetic valve is within  the  expected range. No perivalvular leak noted.  - Mitral Valve: The mitral valve appears unchanged from pre-bypass.  - Tricuspid Valve: The tricuspid valve appears unchanged from pre-bypass.  - Pericardium: The pericardium appears unchanged from pre-bypass.   Patient Profile     64 y.o. female with a hx of aortic stenosis, paroxysmal afib on Eliquis, HTN, HLD, and cardiomyopathy with normalization of EF who is being seen today for the evaluation of afib at the request of dr. Cyndia Bent.  Assessment & Plan     Afib RVR with H/o of paroxysmal Afib - s/p clipping of atrial appendage 4/22 previously on Eliquis - Patient went into afib RVR 4/28 with rates up into the 180s and felt overall  terrible. She was started on amio drip and was given some IV Lopressor.  - she underwent a successful DCCV yesterday and remains in SR with rates in 70-80'. She feels better.  - Prior to admission she was on Coreg 25 mg BID, Cardizem 30 mg qH.  - she is on Coreg 3.125mg  BID, d/c digoxin, change amiodarone to PO, replace K  - TSH 1.3 - Plan for discharge today, we will arrange for outpatient follow-up.  AS s/p AVR - Post Intraop echo showed no AI and normal gradients  DM2 - new dx - plan to follow-up as outpatient   For questions or updates, please contact [facility] Please consult [email or web address] for contact info under    Ena Dawley, MD 07/12/2019

## 2019-07-12 NOTE — Progress Notes (Signed)
Pt discharged today to home with family.  Pt's IV removed.  Pt taken off telemetry and CCMD notified.  Pt left with all of their personal belongings.  AVS documentation reviewed with Pt and questions answered.

## 2019-07-12 NOTE — Progress Notes (Signed)
      [address]       [place] [address]             [phone]      2 Days Post-Op Procedure(s) (LRB): CARDIOVERSION (N/A) Subjective: Feels good this morning. Anxious to go home.   Objective: Vital signs in last 24 hours: Temp:  [98.1 F (36.7 C)-98.5 F (36.9 C)] 98.1 F (36.7 C) (05/02 0455) Pulse Rate:  [80-97] 83 (05/02 0455) Cardiac Rhythm: Normal sinus rhythm (05/02 0700) Resp:  [13-24] 24 (05/02 0455) BP: (92-113)/(60-72) 105/64 (05/02 0455) SpO2:  [93 %-100 %] 100 % (05/02 0455) Weight:  [62.6 kg] 62.6 kg (05/02 0455)    Intake/Output from previous day: 05/01 0701 - 05/02 0700 In: 200 [P.O.:200] Out: 900 [Urine:900] Intake/Output this shift: No intake/output data recorded.  General appearance: alert, cooperative and no distress Heart: regular rate and rhythm, S1, S2 normal, no murmur, click, rub or gallop Lungs: clear to auscultation bilaterally Abdomen: soft, non-tender; bowel sounds normal; no masses,  no organomegaly Extremities: extremities normal, atraumatic, no cyanosis or edema Wound: clean and dry  Lab Results: No results for input(s): WBC, HGB, HCT, PLT in the last 72 hours. BMET:  Recent Labs    07/10/19 0823 07/11/19 0239  NA 137 134*  K 4.0 3.8  CL 97* 97*  CO2 30 27  GLUCOSE 129* 135*  BUN 18 17  CREATININE 0.87 0.95  CALCIUM 9.0 8.5*    PT/INR: No results for input(s): LABPROT, INR in the last 72 hours. ABG    Component Value Date/Time   PHART 7.362 07/03/2019 0433   HCO3 26.6 07/03/2019 0433   TCO2 28 07/03/2019 0433   ACIDBASEDEF 2.0 07/02/2019 1950   O2SAT 99.0 07/03/2019 0433   CBG (last 3)  Recent Labs    07/11/19 1649 07/11/19 2129 07/12/19 0620  GLUCAP 88 147* 143*    Assessment/Plan: S/P Procedure(s) (LRB): CARDIOVERSION (N/A)  1. CV-DCCV on 4/30. Continue oral Amio. No further episodes of atrial fibrillation.  2. Pulm-tolerating room air with good oxygen saturation. CXR reviewed and  looks like improved pleural effusions from 4/26.  3. Renal-creatinine 0.95, electrolytes okay.  4. H and H 8.7/27.7, expected acute blood loss anemia 5. Endo-blood glucose well controlled.  Plan: Discharge today as long as its okay with Cardiology. Discharge instructions reviewed with the patient.      LOS: 10 days    Elgie Collard 07/12/2019

## 2019-07-14 ENCOUNTER — Other Ambulatory Visit: Payer: Self-pay

## 2019-07-14 ENCOUNTER — Ambulatory Visit (INDEPENDENT_AMBULATORY_CARE_PROVIDER_SITE_OTHER): Payer: Commercial Managed Care - PPO | Admitting: Cardiology

## 2019-07-14 ENCOUNTER — Encounter: Payer: Self-pay | Admitting: Cardiology

## 2019-07-14 VITALS — BP 92/46 | HR 73 | Ht 62.0 in | Wt 139.0 lb

## 2019-07-14 DIAGNOSIS — Z953 Presence of xenogenic heart valve: Secondary | ICD-10-CM | POA: Diagnosis not present

## 2019-07-14 DIAGNOSIS — R0600 Dyspnea, unspecified: Secondary | ICD-10-CM

## 2019-07-14 DIAGNOSIS — E785 Hyperlipidemia, unspecified: Secondary | ICD-10-CM | POA: Diagnosis not present

## 2019-07-14 DIAGNOSIS — R0609 Other forms of dyspnea: Secondary | ICD-10-CM

## 2019-07-14 DIAGNOSIS — I480 Paroxysmal atrial fibrillation: Secondary | ICD-10-CM

## 2019-07-14 HISTORY — DX: Paroxysmal atrial fibrillation: I48.0

## 2019-07-14 NOTE — Progress Notes (Signed)
Cardiology Office Note:    Date:  07/14/2019   ID:  Lindsay Guerrero, DOB [birth year], MRN [id number]  PCP:  Bonnita Nasuti, MD  Cardiologist:  Jenne Campus, MD    Referring MD: Bonnita Nasuti, MD   No chief complaint on file. Still little sore in the chest  History of Present Illness:    Lindsay Guerrero is a 64 y.o. female with past medical history significant for severe aortic stenosis, status post aortic valve replacement with bioprosthesis21 mm Edwards INSPIRIS RESILIA 2 weeks ago.  Hospital course was complicated by atrial fibrillation.\Managed with amiodarone as well as eventually direct-current cardioversion.  She also has history of hyperlipidemia and anxiety.  Overall recovering quite nicely.  She is still complaining of having some pain in the chest from doing well she actually moves around with no difficulties.  She does not take much pain medication.  Overall her recovery looks quite good.   Past Medical History:  Diagnosis Date  . Anxiety state 11/19/2012   ICD10  . Aortic stenosis 12/13/2017   Moderate by echocardiogram from 2019 mean gradient 25  . Asthma    mild  . Atrial fibrillation ([place]) 06/28/2018  . CHF (congestive heart failure) ([place])   . Depression   . Diabetes mellitus without complication (HCC)    borderline  . Dyspnea    on exertion  . Dyspnea on exertion 11/01/2017  . Elective surgery for purposes other than treating health conditions 09/27/2010   ICD10  . Essential hypertension 06/30/2018  . Hepatitis    due to cytomegalovirus-resolved  . History of breast cancer 06/30/2018  . History of hiatal hernia    mild,found on a CT  . Hyperlipidemia 06/30/2018  . Hypokalemia 06/30/2018  . Inflamed seborrheic keratosis 08/08/2012  . Malignant neoplasm of upper-inner quadrant of female breast ([place]) 11/01/2017   Primary  diagnosis  . Mitral regurgitation 12/13/2017   Mild to moderate based on echo from 2019  . NICM (nonischemic cardiomyopathy) ([place]) 11/01/2015  .  Pneumonia 2018  . Precordial pain 11/01/2015  . Seborrheic keratoses 09/10/2016  . Sleep apnea    borderline    Past Surgical History:  Procedure Laterality Date  . AORTIC VALVE REPLACEMENT N/A 07/02/2019   Procedure: AORTIC VALVE REPLACEMENT (AVR) USING INSPIRIS VALVE SIZE 21MM;  Surgeon: Gaye Pollack, MD;  Location: [facility];  Service: Open Heart Surgery;  Laterality: N/A;  . BREAST LUMPECTOMY Left 2003   sentinal node bx.  [name] CARDIOVERSION N/A 07/10/2019   Procedure: CARDIOVERSION;  Surgeon: Donato Heinz, MD;  Location: [facility];  Service: Cardiovascular;  Laterality: N/A;  . CESAREAN SECTION    . CLIPPING OF ATRIAL APPENDAGE N/A 07/02/2019   Procedure: CLIPPING OF ATRIAL APPENDAGE USING PRO2 CLIP SIZE 40MM;  Surgeon: Gaye Pollack, MD;  Location: [facility];  Service: Open Heart Surgery;  Laterality: N/A;  . COSMETIC SURGERY    . IR THORACENTESIS ASP PLEURAL SPACE W/IMG GUIDE  07/07/2019  . RIGHT/LEFT HEART CATH AND CORONARY ANGIOGRAPHY N/A 05/20/2019   Procedure: RIGHT/LEFT HEART CATH AND CORONARY ANGIOGRAPHY;  Surgeon: [name], MD;  Location: [facility];  Service: Cardiovascular;  Laterality: N/A;  . TEE WITHOUT CARDIOVERSION N/A 07/02/2019   Procedure: TRANSESOPHAGEAL ECHOCARDIOGRAM (TEE);  Surgeon: Gaye Pollack, MD;  Location: [facility];  Service: Open Heart Surgery;  Laterality: N/A;  . TONSILLECTOMY      Current Medications: Current Meds  Medication Sig  . acetaminophen (TYLENOL) 500 MG tablet  Take 500-1,000 mg by mouth every 6 (six) hours as needed for headache (pain).  . Acetylcysteine (NAC PO) Take 3 tablets by mouth daily.   [name] albuterol (VENTOLIN HFA) 108 (90 Base) MCG/ACT inhaler Inhale 2 puffs into the lungs every 6 (six) hours as needed for wheezing or shortness of breath.  [name] amiodarone (PACERONE) 200 MG tablet Continue 400mg  (2 tabs) twice a day for 2 days, then take 200mg  (1 tab) twice a day for one week, and then take 1 tab (200mg ) daily until we  see you in the clinic.  [name] amphetamine-dextroamphetamine (ADDERALL) 20 MG tablet Take 10-20 mg by mouth daily as needed (ADD and/or hypersomnolence).   [name] aspirin EC 325 MG EC tablet Take 1 tablet (325 mg total) by mouth daily.  [name] buPROPion (WELLBUTRIN XL) 300 MG 24 hr tablet Take 300 mg by mouth every morning.  . carvedilol (COREG) 3.125 MG tablet Take 1 tablet (3.125 mg total) by mouth 2 (two) times daily with a meal.  . ELIQUIS 5 MG TABS tablet TAKE 1 TABLET BY MOUTH  TWICE DAILY (Patient taking differently: Take 5 mg by mouth 2 (two) times daily. )  . gabapentin (NEURONTIN) 600 MG tablet Take 300 mg by mouth at bedtime as needed for pain.  [name] LORazepam (ATIVAN) 1 MG tablet Take 1 mg by mouth at bedtime as needed for anxiety or sleep.   [name] ondansetron (ZOFRAN) 8 MG tablet Take 4-8 mg by mouth every 8 (eight) hours as needed (nausea while traveling).  [name] oxyCODONE (OXY IR/ROXICODONE) 5 MG immediate release tablet Take 1 tablet (5 mg total) by mouth every 6 (six) hours as needed for severe pain.  [name] oxymetazoline (AFRIN) 0.05 % nasal spray Place 1 spray into both nostrils daily as needed (allergies.).   [name] potassium chloride SA (K-DUR,KLOR-CON) 20 MEQ tablet Take 20 mEq by mouth daily as needed (when takes lasix).   . pramipexole (MIRAPEX) 1.5 MG tablet Take 0.375-1.5 tablets by mouth at bedtime as needed (restless legs).   . Prenatal Vit-Fe Fumarate-FA (MULTIVITAMIN-PRENATAL) 27-0.8 MG TABS tablet Take 1 tablet by mouth daily at 12 noon.  [name] VITAMIN A PO Take 1 capsule by mouth 3 (three) times a week.   . Vitamin D, Ergocalciferol, (DRISDOL) 1.25 MG (50000 UNIT) CAPS capsule Take 50,000 Units by mouth every 7 (seven) days.     Allergies:   Lisinopril, Iodine, and Metoprolol   Social History   Socioeconomic History  . Marital status: Married    Spouse name: Not on file  . Number of children: Not on file  . Years of education: Not on file  . Highest education level: Not on file  Occupational  History  . Not on file  Tobacco Use  . Smoking status: Never Smoker  . Smokeless tobacco: Never Used  Substance and Sexual Activity  . Alcohol use: Yes    Comment: occasional social drinking (once per month)   . Drug use: Never  . Sexual activity: Yes    Partners: Male    Birth control/protection: None  Other Topics Concern  . Not on file  Social History Narrative  . Not on file   Social Determinants of Health   Financial Resource Strain:   . Difficulty of Paying Living Expenses:   Food Insecurity:   . Worried About Charity fundraiser in the Last Year:   . Arboriculturist in the Last Year:   Transportation Needs:   . Film/video editor (Medical):   [name]  Lack of Transportation (Non-Medical):   Physical Activity:   . Days of Exercise per Week:   . Minutes of Exercise per Session:   Stress:   . Feeling of Stress :   Social Connections:   . Frequency of Communication with Friends and Family:   . Frequency of Social Gatherings with Friends and Family:   . Attends Religious Services:   . Active Member of Clubs or Organizations:   . Attends Archivist Meetings:   [name] Marital Status:      Family History: The patient's family history includes Heart Problems in her father and mother; Heart attack in her sister; Hypertension in her mother. ROS:   Please see the history of present illness.    All 14 point review of systems negative except as described per history of present illness  EKGs/Labs/Other Studies Reviewed:      Recent Labs: 06/30/2019: ALT 19 07/06/2019: Hemoglobin 8.7; Platelets 227 07/09/2019: TSH 1.395 07/11/2019: BUN 17; Creatinine, Ser 0.95; Magnesium 1.8; Potassium 3.8; Sodium 134  Recent Lipid Panel    Component Value Date/Time   CHOL 242 (H) 11/01/2017 1030   TRIG 175 (H) 11/01/2017 1030   HDL 47 11/01/2017 1030   CHOLHDL 5.1 (H) 11/01/2017 1030   LDLCALC 160 (H) 11/01/2017 1030    Physical Exam:    VS:  BP (!) 92/46   Pulse 73   Ht 5'  2" (1.575 m)   Wt 139 lb (63 kg)   LMP  (LMP Unknown)   SpO2 98%   BMI 25.42 kg/m     Wt Readings from Last 3 Encounters:  07/14/19 139 lb (63 kg)  07/12/19 137 lb 14.4 oz (62.6 kg)  06/30/19 140 lb 11.2 oz (63.8 kg)     GEN:  Well nourished, well developed in no acute distress HEENT: Normal NECK: No JVD; No carotid bruits LYMPHATICS: No lymphadenopathy CARDIAC: RRR, no murmurs, no rubs, no gallops RESPIRATORY:  Clear to auscultation without rales, wheezing or rhonchi  ABDOMEN: Soft, non-tender, non-distended MUSCULOSKELETAL:  No edema; No deformity  SKIN: Warm and dry LOWER EXTREMITIES: no swelling NEUROLOGIC:  Alert and oriented x 3 PSYCHIATRIC:  Normal affect   ASSESSMENT:    1. S/P aortic valve replacement with bioprosthetic valve   2. Hyperlipidemia, unspecified hyperlipidemia type   3. Dyspnea on exertion   4. Paroxysmal atrial fibrillation (HCC)    PLAN:    In order of problems listed above:  1. Status post aortic valve replacement 2 weeks ago.  She is doing very well.  She will sign her up with rehab program after on the hospital.  Wound is healing completely.  There is no evidence of infection.  She does not have any shortness of breath still have some pain which is understandable in this clinical scenario. 2. Paroxysmal atrial fibrillation.  He is anticoagulated which I will continue.  She is also on amiodarone 200 twice daily with instruction to lowered the dose to only 1 tablet s once a day.  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 3. Dyslipidemia.  We will reassess the situation after she recovers from surgery. 4. Hypertension her blood pressure is 92/46 but she is doing great she is asymptomatic.  On minimal dose of beta-blocker which I will continue.   Medication Adjustments/Labs and Tests Ordered: Current medicines are reviewed at length with the patient today.  Concerns regarding medicines are  outlined above.  No orders of the defined types were placed in this encounter.  Medication changes: No orders of the defined types were placed in this encounter.   Signed, Park Liter, MD, [facility] 07/14/2019 11:03 AM    [place]

## 2019-07-14 NOTE — Patient Instructions (Signed)
Medication Instructions:  Your physician recommends that you continue on your current medications as directed. Please refer to the Current Medication list given to you today.  *If you need a refill on your cardiac medications before your next appointment, please call your pharmacy*   Lab Work: None If you have labs (blood work) drawn today and your tests are completely normal, you will receive your results only by: MyChart Message (if you have MyChart) OR A paper copy in the mail If you have any lab test that is abnormal or we need to change your treatment, we will call you to review the results.   Testing/Procedures: None   Follow-Up: At [facility], you and your health needs are our priority.  As part of our continuing mission to provide you with exceptional heart care, we have created designated Provider Care Teams.  These Care Teams include your primary Cardiologist (physician) and Advanced Practice Providers (APPs -  Physician Assistants and Nurse Practitioners) who all work together to provide you with the care you need, when you need it.  We recommend signing up for the patient portal called "MyChart".  Sign up information is provided on this After Visit Summary.  MyChart is used to connect with patients for Virtual Visits (Telemedicine).  Patients are able to view lab/test results, encounter notes, upcoming appointments, etc.  Non-urgent messages can be sent to your provider as well.   To learn more about what you can do with MyChart, go to [email or web address].    Your next appointment:   1 month(s)  The format for your next appointment:   In Person  Provider:   Robert Krasowski, MD   Other Instructions   

## 2019-07-17 ENCOUNTER — Other Ambulatory Visit: Payer: Self-pay

## 2019-07-17 ENCOUNTER — Ambulatory Visit (INDEPENDENT_AMBULATORY_CARE_PROVIDER_SITE_OTHER): Payer: Self-pay

## 2019-07-17 VITALS — Temp 97.3°F

## 2019-07-17 DIAGNOSIS — Z4802 Encounter for removal of sutures: Secondary | ICD-10-CM

## 2019-07-17 DIAGNOSIS — Z953 Presence of xenogenic heart valve: Secondary | ICD-10-CM

## 2019-07-17 NOTE — Progress Notes (Signed)
Pt comes in today for suture removal s/p AVR on 07/02/19. She is a/o and without c/o. Two sutures in place to upper abdomen; incisions are well approximated and w/o s/s of infection. Sutures removed per standard protocol w/o difficulty. Instructed pt to keep sites clean (w/ mild soap and water) and dry. To notify office of any s/s of infection or other problems.

## 2019-07-18 ENCOUNTER — Telehealth: Payer: Self-pay | Admitting: Physician Assistant

## 2019-07-18 NOTE — Telephone Encounter (Signed)
   Lindsay Guerrero had a bioprosthetic AVR and left atrial appendage clipping performed on 07/02/2019.  She developed atrial fib are on 4/28 and was cardioverted on 4/30.  She had received 3 doses of Eliquis.  She was seen by Dr. Agustin Cree on 5/4 and was doing well.  Today, she went back in to atrial for.  Her heart rate is in the 90s-100s generally, but will spike to 135 or so when she gets up and moves around.  She is not having chest pain or shortness of breath with it.  She can tell she is in it, she has a feeling of discomfort in her head and a slight feeling of discomfort in her chest.  However, those feelings are milder when her heart rate is better controlled.  What to do?  Plan: I requested that she take an extra 400 mg of amiodarone now.  She is to continue taking the amiodarone 200 mg twice daily.  She has not missed any doses of Eliquis.  She is to take an extra dose of carvedilol 3.125 mg as well.  She is to rest today.  If she does not convert overnight, she is to contact us tomorrow.  If her heart rate is still poorly controlled, she may need to come to the emergency room for cardioversion.  Otherwise, I will make sure she is seen at the earliest possible convenience in the office on Monday.  Lindsay Guerrero is in agreement with this as a plan of care.  Rosaria Ferries, PA-C 07/18/2019 5:11 PM

## 2019-07-19 NOTE — Telephone Encounter (Signed)
   Patient called because she followed all instructions as directed, but is still in atrial fibrillation.  Her heart rate is variable, but greater than 100 at times.  Although she is not in acute distress, she feels limited in her activity because she can feel her heart pound when she tries to do anything.  She would like to get out of atrial fibrillation.  As she is not in acute distress, advised her to continue current therapy and I will route the notes to Dr. Agustin Cree.  She can start calling the cardiology office at 8 AM tomorrow, do not eat or drink anything after midnight, and possibly she can get cardioverted at [facility].  She is in agreement with this plan.  Rosaria Ferries, PA-C 07/19/2019 12:46 PM

## 2019-07-20 ENCOUNTER — Encounter: Payer: Self-pay | Admitting: Cardiology

## 2019-07-20 ENCOUNTER — Ambulatory Visit (INDEPENDENT_AMBULATORY_CARE_PROVIDER_SITE_OTHER): Payer: Commercial Managed Care - PPO | Admitting: Cardiology

## 2019-07-20 ENCOUNTER — Other Ambulatory Visit: Payer: Self-pay

## 2019-07-20 VITALS — BP 100/70 | HR 107 | Ht 62.0 in | Wt 142.0 lb

## 2019-07-20 DIAGNOSIS — I428 Other cardiomyopathies: Secondary | ICD-10-CM

## 2019-07-20 DIAGNOSIS — I4891 Unspecified atrial fibrillation: Secondary | ICD-10-CM | POA: Diagnosis not present

## 2019-07-20 DIAGNOSIS — E782 Mixed hyperlipidemia: Secondary | ICD-10-CM

## 2019-07-20 DIAGNOSIS — Z794 Long term (current) use of insulin: Secondary | ICD-10-CM

## 2019-07-20 DIAGNOSIS — E119 Type 2 diabetes mellitus without complications: Secondary | ICD-10-CM

## 2019-07-20 DIAGNOSIS — Z952 Presence of prosthetic heart valve: Secondary | ICD-10-CM

## 2019-07-20 HISTORY — DX: Presence of prosthetic heart valve: Z95.2

## 2019-07-20 HISTORY — DX: Mixed hyperlipidemia: E78.2

## 2019-07-20 NOTE — Patient Instructions (Addendum)
Medication Instructions:  Your physician recommends that you continue on your current medications as directed. Please refer to the Current Medication list given to you today.  *If you need a refill on your cardiac medications before your next appointment, please call your pharmacy*   Lab Work: Your physician recommends that you return for lab work in: TODAY CBC, BMP If you have labs (blood work) drawn today and your tests are completely normal, you will receive your results only by: [name] MyChart Message (if you have MyChart) OR . A paper copy in the mail If you have any lab test that is abnormal or we need to change your treatment, we will call you to review the results.   Testing/Procedures: You are scheduled for a cardioversion at [facility] on 07/24/19 @8AM    DIET: Nothing to eat or drink after midnight except a sip of water with medications (see medication instructions below)  Medication Instructions:  Continue your anticoagulant: Eliquis You will need to continue your anticoagulant after your procedure until you are told by your  Provider that it is safe to stop  You must have a responsible person to drive you home and stay in the waiting area during your procedure. Failure to do so could result in cancellation.  Bring your insurance cards.  *Special Note: Every effort is made to have your procedure done on time. Occasionally there are emergencies that occur at the hospital that may cause delays. Please be patient if a delay does occur.    Follow-Up: At [facility], you and your health needs are our priority.  As part of our continuing mission to provide you with exceptional heart care, we have created designated Provider Care Teams.  These Care Teams include your primary Cardiologist (physician) and Advanced Practice Providers (APPs -  Physician Assistants and Nurse Practitioners) who all work together to provide you with the care you need, when you need it.  We  recommend signing up for the patient portal called "MyChart".  Sign up information is provided on this After Visit Summary.  MyChart is used to connect with patients for Virtual Visits (Telemedicine).  Patients are able to view lab/test results, encounter notes, upcoming appointments, etc.  Non-urgent messages can be sent to your provider as well.   To learn more about what you can do with MyChart, go to [email or web address].    Your next appointment:   1 month(s)  The format for your next appointment:   In Person  Provider:   Berniece Salines, DO   Other Instructions

## 2019-07-20 NOTE — Telephone Encounter (Signed)
Follow up   Pt calling back to follow up her afib episode this weekend. She said she made herself NPO just in case Dr. Agustin Cree wanted to do cardioversion today.  Please advise

## 2019-07-20 NOTE — Progress Notes (Signed)
Cardiology Office Note:    Date:  07/20/2019   ID:  Lindsay Guerrero, DOB Oct 28, 1955, MRN [id number]  PCP:  Bonnita Nasuti, MD  Cardiologist:  Jenne Campus, MD  Electrophysiologist:  None   Referring MD: Bonnita Nasuti, MD   Chief Complaint  Patient presents with  . Atrial Fibrillation    History of Present Illness:    Lindsay Guerrero is a 64 y.o. female with a hx of severe aortic stenosis status post aortic valve replacement with bioprosthetic 21 mm Edwards INSPIRIS RESILIA, postoperative atrial fibrillation now on amiodarone, status post DC cardioversion while admitted and on Eliquis currently, hyperlipidemia and anxiety.  The patient requested to be seen today as she reported that she was experiencing palpitations and was concerned that she was back in atrial fibrillation.  She reports that she feels generalized fatigue and tired.  No other complaints at this time.  She denies any chest pain, shortness of breath, nausea, vomiting.  Past Medical History:  Diagnosis Date  . Anxiety state 11/19/2012   ICD10  . Aortic stenosis 12/13/2017   Moderate by echocardiogram from 2019 mean gradient 25  . Asthma    mild  . Atrial fibrillation ([place]) 06/28/2018  . CHF (congestive heart failure) ([place])   . Depression   . Diabetes mellitus without complication (HCC)    borderline  . Dyspnea    on exertion  . Dyspnea on exertion 11/01/2017  . Elective surgery for purposes other than treating health conditions 09/27/2010   ICD10  . Essential hypertension 06/30/2018  . Hepatitis    due to cytomegalovirus-resolved  . History of breast cancer 06/30/2018  . History of hiatal hernia    mild,found on a CT  . Hyperlipidemia 06/30/2018  . Hypokalemia 06/30/2018  . Inflamed seborrheic keratosis 08/08/2012  . Malignant neoplasm of upper-inner quadrant of female breast ([place]) 11/01/2017   Primary  diagnosis  . Mitral regurgitation 12/13/2017   Mild to moderate based on echo from 2019  . NICM  (nonischemic cardiomyopathy) ([place]) 11/01/2015  . Pneumonia 2018  . Precordial pain 11/01/2015  . Seborrheic keratoses 09/10/2016  . Sleep apnea    borderline    Past Surgical History:  Procedure Laterality Date  . AORTIC VALVE REPLACEMENT N/A 07/02/2019   Procedure: AORTIC VALVE REPLACEMENT (AVR) USING INSPIRIS VALVE SIZE 21MM;  Surgeon: Gaye Pollack, MD;  Location: [place];  Service: Open Heart Surgery;  Laterality: N/A;  . BREAST LUMPECTOMY Left 2003   sentinal node bx.  [name] CARDIOVERSION N/A 07/10/2019   Procedure: CARDIOVERSION;  Surgeon: Donato Heinz, MD;  Location: [place];  Service: Cardiovascular;  Laterality: N/A;  . CESAREAN SECTION    . CLIPPING OF ATRIAL APPENDAGE N/A 07/02/2019   Procedure: CLIPPING OF ATRIAL APPENDAGE USING PRO2 CLIP SIZE 40MM;  Surgeon: Gaye Pollack, MD;  Location: [place];  Service: Open Heart Surgery;  Laterality: N/A;  . COSMETIC SURGERY    . IR THORACENTESIS ASP PLEURAL SPACE W/IMG GUIDE  07/07/2019  . RIGHT/LEFT HEART CATH AND CORONARY ANGIOGRAPHY N/A 05/20/2019   Procedure: RIGHT/LEFT HEART CATH AND CORONARY ANGIOGRAPHY;  Surgeon: [name], MD;  Location: [place] [facility];  Service: Cardiovascular;  Laterality: N/A;  . TEE WITHOUT CARDIOVERSION N/A 07/02/2019   Procedure: TRANSESOPHAGEAL ECHOCARDIOGRAM (TEE);  Surgeon: Gaye Pollack, MD;  Location: [place];  Service: Open Heart Surgery;  Laterality: N/A;  . TONSILLECTOMY      Current Medications: Current Meds  Medication Sig  .  acetaminophen (TYLENOL) 500 MG tablet Take 500-1,000 mg by mouth every 6 (six) hours as needed for headache (pain).  . Acetylcysteine (NAC PO) Take 3 tablets by mouth daily.   [name] albuterol (VENTOLIN HFA) 108 (90 Base) MCG/ACT inhaler Inhale 2 puffs into the lungs every 6 (six) hours as needed for wheezing or shortness of breath.  [name] amiodarone (PACERONE) 200 MG tablet Continue 400mg  (2 tabs) twice a day for 2 days, then take 200mg  (1 tab) twice a day for one  week, and then take 1 tab (200mg ) daily until we see you in the clinic.  [name] amphetamine-dextroamphetamine (ADDERALL) 20 MG tablet Take 10-20 mg by mouth daily as needed (ADD and/or hypersomnolence).   [name] aspirin EC 325 MG EC tablet Take 1 tablet (325 mg total) by mouth daily.  [name] buPROPion (WELLBUTRIN XL) 300 MG 24 hr tablet Take 300 mg by mouth every morning.  . carvedilol (COREG) 3.125 MG tablet Take 1 tablet (3.125 mg total) by mouth 2 (two) times daily with a meal.  . ELIQUIS 5 MG TABS tablet TAKE 1 TABLET BY MOUTH  TWICE DAILY  . gabapentin (NEURONTIN) 600 MG tablet Take 300 mg by mouth at bedtime as needed for pain.  [name] LORazepam (ATIVAN) 1 MG tablet Take 1 mg by mouth at bedtime as needed for anxiety or sleep.   [name] ondansetron (ZOFRAN) 8 MG tablet Take 4-8 mg by mouth every 8 (eight) hours as needed (nausea while traveling).  [name] oxyCODONE (OXY IR/ROXICODONE) 5 MG immediate release tablet Take 1 tablet (5 mg total) by mouth every 6 (six) hours as needed for severe pain.  [name] oxymetazoline (AFRIN) 0.05 % nasal spray Place 1 spray into both nostrils daily as needed (allergies.).   [name] potassium chloride SA (K-DUR,KLOR-CON) 20 MEQ tablet Take 20 mEq by mouth daily as needed (when takes lasix).   . pramipexole (MIRAPEX) 1.5 MG tablet Take 0.375-1.5 tablets by mouth at bedtime as needed (restless legs).   . Prenatal Vit-Fe Fumarate-FA (MULTIVITAMIN-PRENATAL) 27-0.8 MG TABS tablet Take 1 tablet by mouth daily at 12 noon.  [name] VITAMIN A PO Take 1 capsule by mouth 3 (three) times a week.   . Vitamin D, Ergocalciferol, (DRISDOL) 1.25 MG (50000 UNIT) CAPS capsule Take 50,000 Units by mouth every 7 (seven) days.     Allergies:   Lisinopril, Iodine, and Metoprolol   Social History   Socioeconomic History  . Marital status: Married    Spouse name: Not on file  . Number of children: Not on file  . Years of education: Not on file  . Highest education level: Not on file  Occupational History  . Not on file    Tobacco Use  . Smoking status: Never Smoker  . Smokeless tobacco: Never Used  Substance and Sexual Activity  . Alcohol use: Yes    Comment: occasional social drinking (once per month)   . Drug use: Never  . Sexual activity: Yes    Partners: Male    Birth control/protection: None  Other Topics Concern  . Not on file  Social History Narrative  . Not on file   Social Determinants of Health   Financial Resource Strain:   . Difficulty of Paying Living Expenses:   Food Insecurity:   . Worried About Charity fundraiser in the Last Year:   . Arboriculturist in the Last Year:   Transportation Needs:   . Film/video editor (Medical):   [name] Lack of Transportation (Non-Medical):  Physical Activity:   . Days of Exercise per Week:   . Minutes of Exercise per Session:   Stress:   . Feeling of Stress :   Social Connections:   . Frequency of Communication with Friends and Family:   . Frequency of Social Gatherings with Friends and Family:   . Attends Religious Services:   . Active Member of Clubs or Organizations:   . Attends Archivist Meetings:   [name] Marital Status:      Family History: The patient's family history includes Heart Problems in her father and mother; Heart attack in her sister; Hypertension in her mother.  ROS:   Review of Systems  Constitution: Reports generalized fatigue.  Negative for decreased appetite, fever and weight gain.  HENT: Negative for congestion, ear discharge, hoarse voice and sore throat.   Eyes: Negative for discharge, redness, vision loss in right eye and visual halos.  Cardiovascular: Reports shortness of breath.  Negative for chest pain, leg swelling, orthopnea and palpitations.  Respiratory: Negative for cough, hemoptysis, shortness of breath and snoring.   Endocrine: Negative for heat intolerance and polyphagia.  Hematologic/Lymphatic: Negative for bleeding problem. Does not bruise/bleed easily.  Skin: Negative for flushing, nail  changes, rash and suspicious lesions.  Musculoskeletal: Negative for arthritis, joint pain, muscle cramps, myalgias, neck pain and stiffness.  Gastrointestinal: Negative for abdominal pain, bowel incontinence, diarrhea and excessive appetite.  Genitourinary: Negative for decreased libido, genital sores and incomplete emptying.  Neurological: Negative for brief paralysis, focal weakness, headaches and loss of balance.  Psychiatric/Behavioral: Negative for altered mental status, depression and suicidal ideas.  Allergic/Immunologic: Negative for HIV exposure and persistent infections.    EKGs/Labs/Other Studies Reviewed:    The following studies were reviewed today:   EKG:  The ekg ordered today demonstrates atrial fibrillation with rapid ventricular rate, heart rate 114 bpm.  TTE 123XX123 Complications: No known complications during this procedure.  POST-OP IMPRESSIONS  - Left Ventricle: The left ventricle is unchanged from pre-bypass.  - Aorta: The aorta appears unchanged from pre-bypass.  - Left Atrial Appendage: A Pro 2 Clip was placed.- Aortic Valve: No stenosis present. A bioprosthetic valve was placed, leaflets are not freely mobile Manufactured by; Resilia Size; 64mm. There is no  regurgitation. The gradient recorded across the prosthetic valve is within the  expected range. No perivalvular leak noted. - Mitral Valve: The mitral valve appears unchanged from pre-bypass.  - Tricuspid Valve: The tricuspid valve appears unchanged from pre-bypass.  - Pericardium: The pericardium appears unchanged from pre-bypass.   PRE-OP FINDINGS  Left Ventricle: The left ventricle has normal systolic function, with an  ejection fraction of 60-65%. The cavity size was normal. There is no  increase in left ventricular wall thickness. No evidence of left  ventricular regional wall motion  abnormalities.   Right Ventricle: The right ventricle has normal systolic function. The  cavity was normal.  There is no increase in right ventricular wall  thickness.   Left Atrium: Left atrial size was normal in size. The left atrial  appendage is well visualized and there is no evidence of thrombus present.  Left atrial appendage velocity is normal at greater than 40 cm/s.   Right Atrium: Right atrial size was normal in size.   Interatrial Septum: No atrial level shunt detected by color flow Doppler.   Pericardium: There is no evidence of pericardial effusion. There is no  evidence of cardiac tamponade.   Mitral Valve: The mitral valve is normal in  structure. No thickening of  the mitral valve leaflet. No calcification of the mitral valve leaflet.  Mitral valve regurgitation is trivial by color flow Doppler. The MR jet is  centrally-directed. There is no  evidence of mitral valve vegetation.   Tricuspid Valve: The tricuspid valve was normal in structure. Tricuspid  valve regurgitation is mild by color flow Doppler. The jet is directed  centrally. The tricuspid valve is mildly thickened. There is no evidence  of tricuspid valve vegetation.   Aortic Valve: The aortic valve is tricuspid There is moderate thickening  of the aortic valve and There is severe calcifcation of the aortic valve  Aortic valve regurgitation is trivial by color flow Doppler. The jet is  centrally-directed. There is  moderate-severe stenosis of the aortic valve, with a calculated valve area  of 0.98 cm. There is Moderate aortic annular calcification noted. There  is no evidence of a vegetation on the aortic valve.   Pulmonic Valve: The pulmonic valve was normal in structure.  Pulmonic valve regurgitation is not visualized by color flow Doppler.    Aorta: The aortic root, ascending aorta and aortic arch are normal in size  and structure. There is evidence of layered immobile plaque in the  descending aorta; Grade I, measuring 1-62mm in size.   Pulmonary Artery: The pulmonary artery is of normal size.    Rec/ent Labs: 06/30/2019: ALT 19 07/06/2019: Hemoglobin 8.7; Platelets 227 07/09/2019: TSH 1.395 07/11/2019: BUN 17; Creatinine, Ser 0.95; Magnesium 1.8; Potassium 3.8; Sodium 134  Recent Lipid Panel    Component Value Date/Time   CHOL 242 (H) 11/01/2017 1030   TRIG 175 (H) 11/01/2017 1030   HDL 47 11/01/2017 1030   CHOLHDL 5.1 (H) 11/01/2017 1030   LDLCALC 160 (H) 11/01/2017 1030    Physical Exam:    VS:  BP 100/70 (BP Location: Left Arm, Patient Position: Sitting, Cuff Size: Normal)   Pulse (!) 107   Ht 5\' 2"  (1.575 m)   Wt 142 lb (64.4 kg)   LMP  (LMP Unknown)   SpO2 96%   BMI 25.97 kg/m     Wt Readings from Last 3 Encounters:  07/20/19 142 lb (64.4 kg)  07/14/19 139 lb (63 kg)  07/12/19 137 lb 14.4 oz (62.6 kg)     GEN: Well nourished, well developed in no acute distress HEENT: Normal NECK: No JVD; No carotid bruits LYMPHATICS: No lymphadenopathy CARDIAC: S1S2 noted,RRR, no murmurs, rubs, gallops RESPIRATORY:  Clear to auscultation without rales, wheezing or rhonchi  ABDOMEN: Soft, non-tender, non-distended, +bowel sounds, no guarding. EXTREMITIES: No edema, No cyanosis, no clubbing MUSCULOSKELETAL:  No deformity  SKIN: Warm and dry NEUROLOGIC:  Alert and oriented x 3, non-focal PSYCHIATRIC:  Normal affect, good insight  ASSESSMENT:    1. Atrial fibrillation with RVR ([place])   2. NICM (nonischemic cardiomyopathy) ([place])   3. S/P AVR (aortic valve replacement)   4. Mixed hyperlipidemia   5. Type 2 diabetes mellitus without complication, with long-term current use of insulin (HCC)    PLAN:     She has post operative afib - she is on eliquis and amiodarone. She is symptomatic with shortness of breath whenever she is back in afib.   She has felt better in the past when not in afib. She is in afib today in the office. Given her symptoms we will contiinue to attempt rhythm control in this patient- she is still on amiodarone and was loaded additional 400mg   amiodarone over the weekend. I  therefore discussed another DCCV with the patient and she is willing to proceed. We will set her up for DCCV at [facility]. She is agreeable.   Her blood pressure is on the lower side. We will continue to monitor.   She is status post AVR - now on aspirin 325 mg daily.   Hyperlipidemia - continue patient on her current medication regimen.   The patient is in agreement with the above plan. The patient left the office in stable condition.  The patient will follow up in 1 month with Dr. Agustin Cree.   Medication Adjustments/Labs and Tests Ordered: Current medicines are reviewed at length with the patient today.  Concerns regarding medicines are outlined above.  Orders Placed This Encounter  Procedures  . CBC  . Basic Metabolic Panel (BMET)  . EKG 12-Lead   No orders of the defined types were placed in this encounter.   Patient Instructions  Medication Instructions:  Your physician recommends that you continue on your current medications as directed. Please refer to the Current Medication list given to you today.  *If you need a refill on your cardiac medications before your next appointment, please call your pharmacy*   Lab Work: Your physician recommends that you return for lab work in: TODAY CBC, BMP If you have labs (blood work) drawn today and your tests are completely normal, you will receive your results only by: [name] MyChart Message (if you have MyChart) OR . A paper copy in the mail If you have any lab test that is abnormal or we need to change your treatment, we will call you to review the results.   Testing/Procedures: You are scheduled for a cardioversion at [facility] on 07/24/19 @8AM    DIET: Nothing to eat or drink after midnight except a sip of water with medications (see medication instructions below)  Medication Instructions:  Continue your anticoagulant: Eliquis You will need to continue your anticoagulant after your  procedure until you are told by your  Provider that it is safe to stop  You must have a responsible person to drive you home and stay in the waiting area during your procedure. Failure to do so could result in cancellation.  Bring your insurance cards.  *Special Note: Every effort is made to have your procedure done on time. Occasionally there are emergencies that occur at the hospital that may cause delays. Please be patient if a delay does occur.    Follow-Up: At [facility], you and your health needs are our priority.  As part of our continuing mission to provide you with exceptional heart care, we have created designated Provider Care Teams.  These Care Teams include your primary Cardiologist (physician) and Advanced Practice Providers (APPs -  Physician Assistants and Nurse Practitioners) who all work together to provide you with the care you need, when you need it.  We recommend signing up for the patient portal called "MyChart".  Sign up information is provided on this After Visit Summary.  MyChart is used to connect with patients for Virtual Visits (Telemedicine).  Patients are able to view lab/test results, encounter notes, upcoming appointments, etc.  Non-urgent messages can be sent to your provider as well.   To learn more about what you can do with MyChart, go to [email or web address].    Your next appointment:   1 month(s)  The format for your next appointment:   In Person  Provider:   Berniece Salines, DO   Other Instructions  Adopting a Healthy Lifestyle.  Know what a healthy weight is for you (roughly BMI <25) and aim to maintain this   Aim for 7+ servings of fruits and vegetables daily   65-80+ fluid ounces of water or unsweet tea for healthy kidneys   Limit to max 1 drink of alcohol per day; avoid smoking/tobacco   Limit animal fats in diet for cholesterol and heart health - choose grass fed whenever available   Avoid highly processed foods, and foods  high in saturated/trans fats   Aim for low stress - take time to unwind and care for your mental health   Aim for 150 min of moderate intensity exercise weekly for heart health, and weights twice weekly for bone health   Aim for 7-9 hours of sleep daily   When it comes to diets, agreement about the perfect plan isnt easy to find, even among the experts. Experts at the [employer] developed an idea known as the Healthy Eating Plate. Just imagine a plate divided into logical, healthy portions.   The emphasis is on diet quality:   Load up on vegetables and fruits - one-half of your plate: Aim for color and variety, and remember that potatoes dont count.   Go for whole grains - one-quarter of your plate: Whole wheat, barley, wheat berries, quinoa, oats, brown rice, and foods made with them. If you want pasta, go with whole wheat pasta.   Protein power - one-quarter of your plate: Fish, chicken, beans, and nuts are all healthy, versatile protein sources. Limit red meat.   The diet, however, does go beyond the plate, offering a few other suggestions.   Use healthy plant oils, such as olive, canola, soy, corn, sunflower and peanut. Check the labels, and avoid partially hydrogenated oil, which have unhealthy trans fats.   If youre thirsty, drink water. Coffee and tea are good in moderation, but skip sugary drinks and limit milk and dairy products to one or two daily servings.   The type of carbohydrate in the diet is more important than the amount. Some sources of carbohydrates, such as vegetables, fruits, whole grains, and beans-are healthier than others.   Finally, stay active  Signed, Berniece Salines, DO  07/20/2019 9:57 PM    [facility]

## 2019-07-20 NOTE — Telephone Encounter (Signed)
Per Dr. Agustin Cree patient needs appointment today for a ekg we scheduled her with Dr. Harriet Masson for today. Patient informed.

## 2019-07-20 NOTE — Telephone Encounter (Signed)
I spoke to her over the phone call. She is doing well overall. She feel irregularity of her heartbeat. But denies having any chest pain, no shortness of breath, blood pressures 100 and more. Her heart rate is controlled. Overall she is doing well. I will propose to wait another day or 2 before committing her to cardioversion.

## 2019-07-21 ENCOUNTER — Telehealth: Payer: Self-pay

## 2019-07-21 LAB — CBC
Hematocrit: 33.9 % — ABNORMAL LOW (ref 34.0–46.6)
Hemoglobin: 10.6 g/dL — ABNORMAL LOW (ref 11.1–15.9)
MCH: 28.2 pg (ref 26.6–33.0)
MCHC: 31.3 g/dL — ABNORMAL LOW (ref 31.5–35.7)
MCV: 90 fL (ref 79–97)
Platelets: 845 10*3/uL (ref 150–450)
RBC: 3.76 x10E6/uL — ABNORMAL LOW (ref 3.77–5.28)
RDW: 13.4 % (ref 11.7–15.4)
WBC: 5.2 10*3/uL (ref 3.4–10.8)

## 2019-07-21 LAB — BASIC METABOLIC PANEL
BUN/Creatinine Ratio: 15 (ref 12–28)
BUN: 12 mg/dL (ref 8–27)
CO2: 26 mmol/L (ref 20–29)
Calcium: 9.5 mg/dL (ref 8.7–10.3)
Chloride: 103 mmol/L (ref 96–106)
Creatinine, Ser: 0.79 mg/dL (ref 0.57–1.00)
GFR calc Af Amer: 91 mL/min/{1.73_m2} (ref >59)
GFR calc non Af Amer: 79 mL/min/{1.73_m2} (ref >59)
Glucose: 94 mg/dL (ref 65–99)
Potassium: 5.3 mmol/L — ABNORMAL HIGH (ref 3.5–5.2)
Sodium: 141 mmol/L (ref 134–144)

## 2019-07-21 NOTE — Telephone Encounter (Signed)
Spoke with patient regarding results and recommendation.  Patient verbalizes understanding and is agreeable to plan of care. Advised patient to call back with any issues or concerns.   I also spoke with the patients PCP nurse. She stated that she would put in the orders for this patient to have the repeat labs done in the office tomorrow.   The patient is aware of this and states that she will go to get them done in the morning.

## 2019-07-21 NOTE — Telephone Encounter (Signed)
-----   Message from Berniece Salines, DO sent at 07/21/2019  8:18 AM EDT ----- Hemoglobin stable.  Potassium slightly elevated-skipped taking potassium today tomorrow.  Your platelet is a 845 big jump weeks ago.-We will need to repeat your CBC to confirm this I recommend you get that done at the lab [facility]).  I can forward these results to Dr. London Pepper so he can repeat the cbc.   Please forward copy to the patient PCP.  Please speak with the patient's PCP nurse BMP antiplatelet hopefully for him to repeat the labs in a day or 2.

## 2019-07-23 ENCOUNTER — Telehealth: Payer: Self-pay

## 2019-07-23 NOTE — Telephone Encounter (Signed)
I was able to speak to the patient, she understands will be in her best interest to reschedule cardioversion for now in light of her elevated platelet count

## 2019-07-23 NOTE — Telephone Encounter (Signed)
I spoke with the patient just now and let her know that per Dr. Terrial Rhodes verbal order we need to reschedule this patients cardioversion for tomorrow due to her lab work not being back from her PCP office. I called her PCP at 0820 and they verified with me that the labs were still not back. I let the patient know that with her platelet level being so high it was a safety issue and they could not cardiovert her until the repeat labs were back. The patient was very upset at hearing this and states " I do not accept that as an answer". She states that "It is ridiculous that my labs are not back yet and someone needs to get them". I let her know that her PCP doctor said they would send [place] the labs as soon as they got them back but I could not guarantee an exact time and this is why we were needing to reschedule.    I will route this to Dr. Harriet Masson for further review and see if there is anything that can be done further at this time.

## 2019-07-24 ENCOUNTER — Telehealth: Payer: Self-pay | Admitting: Cardiology

## 2019-07-24 NOTE — Telephone Encounter (Signed)
Patient states that her HR at rest has been in the 120's at times. It is not consistently this high but she has found it to be this high several times this week.   She is wondering if her amiodarone 200 mg tablet is enough or if it should be increased.   I did advise for her to go to urgent care or to see her PCP for the fever and chills that she is describing.   I am routing to Dr. Agustin Cree for further advise and recommendations.

## 2019-07-24 NOTE — Telephone Encounter (Signed)
Good thinking, yes please increase amiodarone to 2 her milligrams twice daily

## 2019-07-24 NOTE — Telephone Encounter (Signed)
Spoke to the patient just now and let her know Dr. Wendy Poet recommendation. She verbalizes understanding and states that she does not need another refill on this medication at this time but will let us know when she does need it.    Encouraged patient to call back with any questions or concerns.

## 2019-07-24 NOTE — Telephone Encounter (Signed)
   Pt c/o medication issue:  1. Name of Medication: amiodarone (PACERONE) 200 MG tabletc  2. How are you currently taking this medication (dosage and times per day)?   3. Are you having a reaction (difficulty breathing--STAT)?   4. What is your medication issue? Pt said, she was instructed my Dr. Agustin Cree to decrease her dosage to 1 tablet a day. She said she doesn't think that will help her since her HR at rest is at 120. She also said last night, she was shivering and took tylenol and it took a while before it stop  Please call

## 2019-07-27 ENCOUNTER — Telehealth: Payer: Self-pay | Admitting: Cardiology

## 2019-07-27 ENCOUNTER — Telehealth: Payer: Self-pay

## 2019-07-27 NOTE — Telephone Encounter (Signed)
Patient contacted the office concerned about abdominal swelling and some activity intolerance.  She stated that she has also been running a low grade temperature.  When asked she stated that her incision sites were healing well and no thoughts of infection at this time.  Advised patient to keep appointment with her Cardiologist on Wednesday and contact us if she has any other problems.  She acknowledged receipt.

## 2019-07-27 NOTE — Telephone Encounter (Signed)
Dr. Agustin Cree spoke with patient and patient was also scheduled for a appointment on Wednesday.

## 2019-07-27 NOTE — Telephone Encounter (Signed)
  Pt c/o swelling: STAT is pt has developed SOB within 24 hours  1) How much weight have you gained and in what time span? No  2) If swelling, where is the swelling located? Both feet, belly  3) Are you currently taking a fluid pill? Yes  4) Are you currently SOB? YES, been one week  5) Do you have a log of your daily weights (if so, list)? 143, 141  6) Have you gained 3 pounds in a day or 5 pounds in a week? No  7) Have you traveled recently? No  Pt also sent a mychart message this  morning

## 2019-07-29 ENCOUNTER — Other Ambulatory Visit: Payer: Self-pay

## 2019-07-29 ENCOUNTER — Encounter: Payer: Self-pay | Admitting: Cardiology

## 2019-07-29 ENCOUNTER — Ambulatory Visit (INDEPENDENT_AMBULATORY_CARE_PROVIDER_SITE_OTHER): Payer: Commercial Managed Care - PPO | Admitting: Cardiology

## 2019-07-29 VITALS — BP 128/70 | HR 71 | Ht 62.0 in | Wt 140.0 lb

## 2019-07-29 DIAGNOSIS — I480 Paroxysmal atrial fibrillation: Secondary | ICD-10-CM

## 2019-07-29 DIAGNOSIS — Z952 Presence of prosthetic heart valve: Secondary | ICD-10-CM

## 2019-07-29 DIAGNOSIS — I10 Essential (primary) hypertension: Secondary | ICD-10-CM

## 2019-07-29 DIAGNOSIS — R0600 Dyspnea, unspecified: Secondary | ICD-10-CM

## 2019-07-29 DIAGNOSIS — R0609 Other forms of dyspnea: Secondary | ICD-10-CM

## 2019-07-29 NOTE — Progress Notes (Signed)
Cardiology Office Note:    Date:  07/29/2019   ID:  Lindsay Guerrero, DOB 12/28/1955, MRN [id number]  PCP:  Bonnita Nasuti, MD  Cardiologist:  Jenne Campus, MD    Referring MD: Bonnita Nasuti, MD   No chief complaint on file. I am doing much better  History of Present Illness:    Lindsay Guerrero is a 64 y.o. female past medical history significant for severe arctic stenosis that required replacement of the valve with bioprosthesis which is 21 mm Edwards prosthesis.  That was done about a month ago.  Hospital course was complicated by atrial fibrillation that required amiodarone therapy then she converted to sinus rhythm.  I did see her last time about 2 weeks ago she was doing quite well at the time but in the meantime she went back to atrial fibrillation she did see my partner Dr. Blanch Media in the meantime who put her on higher dose of amiodarone.  That said for very well because she converted back to normal rhythm.  Interestingly she called [place] few days ago feeling very poorly after she went back to normal rhythm she was hypotensive.  So, we invited her to come to our office today she looks very good.  Her EKG showed normal sinus rhythm.  She took extra 40 mg of furosemide for last 2 days and feeling much better.  Denies have any chest pain tightness squeezing pressure burning chest.  Past Medical History:  Diagnosis Date  . Adult attention deficit disorder 05/15/2013  . Anxiety state 11/19/2012   ICD10  . Aortic stenosis 12/13/2017   Moderate by echocardiogram from 2019 mean gradient 25  . Aortic stenosis, mild 07/13/2016   Moderate by echocardiogram from 2019 mean gradient 25  . Aortic stenosis, severe 07/02/2019  . Aortic valve sclerosis 05/24/2015   Formatting of this note might be different from the original. Without stenosis. Last Echo 2012  . Asthma    mild  . Atrial fibrillation ([place]) 06/28/2018  . Atrial fibrillation with RVR ([place]) 06/28/2018  . CHF (congestive heart failure) ([place])    . Chronic bilateral low back pain 01/27/2015  . Depression   . Diabetes mellitus without complication (HCC)    borderline  . Dyspnea    on exertion  . Dyspnea on exertion 11/01/2017  . Elective surgery for purposes other than treating health conditions 09/27/2010   ICD10  . Essential hypertension 06/30/2018  . Family history of colon cancer 03/01/2005  . Family history of premature coronary artery disease 03/01/2005  . GAD (generalized anxiety disorder) 03/01/2005   Formatting of this note might be different from the original. with panic attacks  . Hepatitis    due to cytomegalovirus-resolved  . History of breast cancer 06/30/2018  . History of hiatal hernia    mild,found on a CT  . History of malignant neoplasm of breast 03/01/2005   Formatting of this note might be different from the original. node neg; see chartnotes for treatment  . HTN (hypertension), benign 07/13/2016  . Hyperlipidemia 06/30/2018  . Hypokalemia 06/30/2018  . Inflamed seborrheic keratosis 08/08/2012  . Irritable bowel syndrome 03/01/2005  . Malignant neoplasm of upper-inner quadrant of female breast ([place]) 11/01/2017   Primary  diagnosis  . Mild intermittent asthma without complication [id number]   Formatting of this note might be different from the original. mild  . Mitral regurgitation 12/13/2017   Mild to moderate based on echo from 2019  . Mixed hyperlipidemia 07/20/2019  . NICM (  nonischemic cardiomyopathy) ([place]) 11/01/2015  . Osteopenia 01/27/2015  . Paroxysmal atrial fibrillation ([place]) 07/14/2019  . Pneumonia 2018  . Post menopausal problems 01/27/2015  . Precordial pain 11/01/2015  . Psoriatic arthritis ([place]) 04/25/2015  . Recurrent genital HSV (herpes simplex virus) infection 03/01/2005   Formatting of this note might be different from the original. HSV genital  . Restless legs syndrome 03/01/2005  . Rosacea 03/01/2005  . Routine history and physical examination of adult 09/22/2012   Formatting of this note  might be different from the original. PROBLEM LIST:  1. Left node negative breast cancer 2002, stage I, grade 1, status post  lumpectomy and node dissection, external beam radiation,  chemotherapy/Cytoxan, mild left arm lymphedema with cellulitis 2004.  2. Anxiety disorder with panic attacks, family history of same, globus  pharyngeus, palpitations, atypical chest pain, str  . S/P aortic valve replacement with bioprosthetic valve 21 mm Edwards INSPIRIS RESILIA  07/02/2019   Size 21 mm  . S/P AVR (aortic valve replacement) 07/20/2019  . Seborrheic keratoses 09/10/2016  . Sleep apnea    borderline  . Vaginal dryness 01/27/2015    Past Surgical History:  Procedure Laterality Date  . AORTIC VALVE REPLACEMENT N/A 07/02/2019   Procedure: AORTIC VALVE REPLACEMENT (AVR) USING INSPIRIS VALVE SIZE 21MM;  Surgeon: Gaye Pollack, MD;  Location: [facility];  Service: Open Heart Surgery;  Laterality: N/A;  . BREAST LUMPECTOMY Left 2003   sentinal node bx.  [name] CARDIOVERSION N/A 07/10/2019   Procedure: CARDIOVERSION;  Surgeon: Donato Heinz, MD;  Location: [facility];  Service: Cardiovascular;  Laterality: N/A;  . CESAREAN SECTION    . CLIPPING OF ATRIAL APPENDAGE N/A 07/02/2019   Procedure: CLIPPING OF ATRIAL APPENDAGE USING PRO2 CLIP SIZE 40MM;  Surgeon: Gaye Pollack, MD;  Location: [facility];  Service: Open Heart Surgery;  Laterality: N/A;  . COSMETIC SURGERY    . IR THORACENTESIS ASP PLEURAL SPACE W/IMG GUIDE  07/07/2019  . RIGHT/LEFT HEART CATH AND CORONARY ANGIOGRAPHY N/A 05/20/2019   Procedure: RIGHT/LEFT HEART CATH AND CORONARY ANGIOGRAPHY;  Surgeon: [name], MD;  Location: [facility];  Service: Cardiovascular;  Laterality: N/A;  . TEE WITHOUT CARDIOVERSION N/A 07/02/2019   Procedure: TRANSESOPHAGEAL ECHOCARDIOGRAM (TEE);  Surgeon: Gaye Pollack, MD;  Location: [facility];  Service: Open Heart Surgery;  Laterality: N/A;  . TONSILLECTOMY      Current Medications: Current Meds   Medication Sig  . acetaminophen (TYLENOL) 500 MG tablet Take 500-1,000 mg by mouth every 6 (six) hours as needed for headache (pain).  . Acetylcysteine (NAC PO) Take 3 tablets by mouth daily.   [name] albuterol (VENTOLIN HFA) 108 (90 Base) MCG/ACT inhaler Inhale 2 puffs into the lungs every 6 (six) hours as needed for wheezing or shortness of breath.  [name] amiodarone (PACERONE) 200 MG tablet Continue 400mg  (2 tabs) twice a day for 2 days, then take 200mg  (1 tab) twice a day for one week, and then take 1 tab (200mg ) daily until we see you in the clinic.  [name] amphetamine-dextroamphetamine (ADDERALL) 20 MG tablet Take 10-20 mg by mouth daily as needed (ADD and/or hypersomnolence).   [name] aspirin EC 325 MG EC tablet Take 1 tablet (325 mg total) by mouth daily.  [name] buPROPion (WELLBUTRIN XL) 300 MG 24 hr tablet Take 300 mg by mouth every morning.  . carvedilol (COREG) 3.125 MG tablet Take 1 tablet (3.125 mg total) by mouth 2 (two) times daily with a meal.  . ELIQUIS  5 MG TABS tablet TAKE 1 TABLET BY MOUTH  TWICE DAILY  . gabapentin (NEURONTIN) 600 MG tablet Take 300 mg by mouth at bedtime as needed for pain.  [name] LORazepam (ATIVAN) 1 MG tablet Take 1 mg by mouth at bedtime as needed for anxiety or sleep.   [name] ondansetron (ZOFRAN) 8 MG tablet Take 4-8 mg by mouth every 8 (eight) hours as needed (nausea while traveling).  [name] oxyCODONE (OXY IR/ROXICODONE) 5 MG immediate release tablet Take 1 tablet (5 mg total) by mouth every 6 (six) hours as needed for severe pain.  [name] oxymetazoline (AFRIN) 0.05 % nasal spray Place 1 spray into both nostrils daily as needed (allergies.).   [name] potassium chloride SA (K-DUR,KLOR-CON) 20 MEQ tablet Take 20 mEq by mouth daily as needed (when takes lasix).   . pramipexole (MIRAPEX) 1.5 MG tablet Take 0.375-1.5 tablets by mouth at bedtime as needed (restless legs).   . Prenatal Vit-Fe Fumarate-FA (MULTIVITAMIN-PRENATAL) 27-0.8 MG TABS tablet Take 1 tablet by mouth daily at 12 noon.  [name] VITAMIN A PO  Take 1 capsule by mouth 3 (three) times a week.   . Vitamin D, Ergocalciferol, (DRISDOL) 1.25 MG (50000 UNIT) CAPS capsule Take 50,000 Units by mouth every 7 (seven) days.     Allergies:   Lisinopril, Iodine, and Metoprolol   Social History   Socioeconomic History  . Marital status: Married    Spouse name: Not on file  . Number of children: Not on file  . Years of education: Not on file  . Highest education level: Not on file  Occupational History  . Not on file  Tobacco Use  . Smoking status: Never Smoker  . Smokeless tobacco: Never Used  Substance and Sexual Activity  . Alcohol use: Yes    Comment: occasional social drinking (once per month)   . Drug use: Never  . Sexual activity: Yes    Partners: Male    Birth control/protection: None  Other Topics Concern  . Not on file  Social History Narrative  . Not on file   Social Determinants of Health   Financial Resource Strain:   . Difficulty of Paying Living Expenses:   Food Insecurity:   . Worried About Charity fundraiser in the Last Year:   . Arboriculturist in the Last Year:   Transportation Needs:   . Film/video editor (Medical):   [name] Lack of Transportation (Non-Medical):   Physical Activity:   . Days of Exercise per Week:   . Minutes of Exercise per Session:   Stress:   . Feeling of Stress :   Social Connections:   . Frequency of Communication with Friends and Family:   . Frequency of Social Gatherings with Friends and Family:   . Attends Religious Services:   . Active Member of Clubs or Organizations:   . Attends Archivist Meetings:   [name] Marital Status:      Family History: The patient's family history includes Heart Problems in her father and mother; Heart attack in her sister; Hypertension in her mother. ROS:   Please see the history of present illness.    All 14 point review of systems negative except as described per history of present illness  EKGs/Labs/Other Studies Reviewed:       Recent Labs: 06/30/2019: ALT 19 07/09/2019: TSH 1.395 07/11/2019: Magnesium 1.8 07/20/2019: BUN 12; Creatinine, Ser 0.79; Hemoglobin 10.6; Platelets 845; Potassium 5.3; Sodium 141  Recent Lipid Panel  Component Value Date/Time   CHOL 242 (H) 11/01/2017 1030   TRIG 175 (H) 11/01/2017 1030   HDL 47 11/01/2017 1030   CHOLHDL 5.1 (H) 11/01/2017 1030   LDLCALC 160 (H) 11/01/2017 1030    Physical Exam:    VS:  BP 128/70   Pulse 71   Ht 5\' 2"  (1.575 m)   Wt 140 lb (63.5 kg)   LMP  (LMP Unknown)   SpO2 97%   BMI 25.61 kg/m     Wt Readings from Last 3 Encounters:  07/29/19 140 lb (63.5 kg)  07/20/19 142 lb (64.4 kg)  07/14/19 139 lb (63 kg)     GEN:  Well nourished, well developed in no acute distress HEENT: Normal NECK: No JVD; No carotid bruits LYMPHATICS: No lymphadenopathy CARDIAC: RRR, no murmurs, no rubs, no gallops RESPIRATORY:  Clear to auscultation without rales, wheezing or rhonchi  ABDOMEN: Soft, non-tender, non-distended MUSCULOSKELETAL:  No edema; No deformity  SKIN: Warm and dry LOWER EXTREMITIES: no swelling NEUROLOGIC:  Alert and oriented x 3 PSYCHIATRIC:  Normal affect   ASSESSMENT:    1. S/P AVR (aortic valve replacement)   2. Paroxysmal atrial fibrillation (HCC)   3. HTN (hypertension), benign    PLAN:    In order of problems listed above:  1. Status post aortic valve replacement.  Wound is healing well.  Overall she looks good today.  I will ask her to continue 20 mg furosemide every single day.  I will check Chem-7 today.  proBNP will be done as well. 2. Paroxysmal atrial fibrillation she is anticoagulated which I will continue.  She is on amiodarone 200 mg twice daily which I will continue for now. 3. Essential hypertension blood pressure well controlled. 4. Recovery after open heart surgery about a month ago.  I asked her to slow down with her activities.  Actually the time when I saw her last 2 weeks ago she was getting ready to go to  [employer] to do shopping in the meantime she went to [facility] and I told her she need to go to rehab and be probably rehabilitated with careful monitoring.  I still encourage her to walk on the regular basis but again I stressed importance of the fact that she cannot lift any heavy objects.   Medication Adjustments/Labs and Tests Ordered: Current medicines are reviewed at length with the patient today.  Concerns regarding medicines are outlined above.  No orders of the defined types were placed in this encounter.  Medication changes: No orders of the defined types were placed in this encounter.   Signed, Park Liter, MD, [facility] 07/29/2019 9:27 AM    [place]

## 2019-07-29 NOTE — Patient Instructions (Signed)
Medication Instructions:  Your physician recommends that you continue on your current medications as directed. Please refer to the Current Medication list given to you today.  *If you need a refill on your cardiac medications before your next appointment, please call your pharmacy*   Lab Work: Your physician recommends that you return for lab work today: bmp, pro bnp  If you have labs (blood work) drawn today and your tests are completely normal, you will receive your results only by: . MyChart Message (if you have MyChart) OR . A paper copy in the mail If you have any lab test that is abnormal or we need to change your treatment, we will call you to review the results.   Testing/Procedures: None    Follow-Up: At [facility], you and your health needs are our priority.  As part of our continuing mission to provide you with exceptional heart care, we have created designated Provider Care Teams.  These Care Teams include your primary Cardiologist (physician) and Advanced Practice Providers (APPs -  Physician Assistants and Nurse Practitioners) who all work together to provide you with the care you need, when you need it.  We recommend signing up for the patient portal called "MyChart".  Sign up information is provided on this After Visit Summary.  MyChart is used to connect with patients for Virtual Visits (Telemedicine).  Patients are able to view lab/test results, encounter notes, upcoming appointments, etc.  Non-urgent messages can be sent to your provider as well.   To learn more about what you can do with MyChart, go to [email or web address].    Your next appointment:   1 month(s)  The format for your next appointment:   In Person  Provider:   Robert Krasowski, MD   Other Instructions   

## 2019-07-30 ENCOUNTER — Telehealth: Payer: Self-pay

## 2019-07-30 DIAGNOSIS — I428 Other cardiomyopathies: Secondary | ICD-10-CM

## 2019-07-30 DIAGNOSIS — I350 Nonrheumatic aortic (valve) stenosis: Secondary | ICD-10-CM

## 2019-07-30 DIAGNOSIS — I340 Nonrheumatic mitral (valve) insufficiency: Secondary | ICD-10-CM

## 2019-07-30 LAB — BASIC METABOLIC PANEL
BUN/Creatinine Ratio: 16 (ref 12–28)
BUN: 17 mg/dL (ref 8–27)
CO2: 28 mmol/L (ref 20–29)
Calcium: 9.6 mg/dL (ref 8.7–10.3)
Chloride: 99 mmol/L (ref 96–106)
Creatinine, Ser: 1.04 mg/dL — ABNORMAL HIGH (ref 0.57–1.00)
GFR calc Af Amer: 65 mL/min/{1.73_m2} (ref >59)
GFR calc non Af Amer: 57 mL/min/{1.73_m2} — ABNORMAL LOW (ref >59)
Glucose: 128 mg/dL — ABNORMAL HIGH (ref 65–99)
Potassium: 5 mmol/L (ref 3.5–5.2)
Sodium: 140 mmol/L (ref 134–144)

## 2019-07-30 LAB — PRO B NATRIURETIC PEPTIDE: NT-Pro BNP: 1367 pg/mL — ABNORMAL HIGH (ref 0–301)

## 2019-07-30 LAB — CBC
Hematocrit: 33.9 % — ABNORMAL LOW (ref 34.0–46.6)
Hemoglobin: 10.4 g/dL — ABNORMAL LOW (ref 11.1–15.9)
MCH: 27.6 pg (ref 26.6–33.0)
MCHC: 30.7 g/dL — ABNORMAL LOW (ref 31.5–35.7)
MCV: 90 fL (ref 79–97)
Platelets: 448 10*3/uL (ref 150–450)
RBC: 3.77 x10E6/uL (ref 3.77–5.28)
RDW: 13.7 % (ref 11.7–15.4)
WBC: 6.2 10*3/uL (ref 3.4–10.8)

## 2019-07-30 NOTE — Telephone Encounter (Signed)
Left message on patients voicemail to please return our call.   

## 2019-07-30 NOTE — Telephone Encounter (Signed)
-----   Message from Park Liter, MD sent at 07/30/2019 12:27 PM EDT ----- There is evidence of CHF on the blood test.  Everything else seems to be good.  Please ask her to keep taking furosemide at the same dose that she is taking right now.  Will recheck Chem-7 in about a week

## 2019-07-30 NOTE — Telephone Encounter (Signed)
Spoke with patient regarding results and recommendation.  Patient verbalizes understanding and is agreeable to plan of care. Advised patient to call back with any issues or concerns.  

## 2019-08-06 LAB — BASIC METABOLIC PANEL
BUN/Creatinine Ratio: 17 (ref 12–28)
BUN: 18 mg/dL (ref 8–27)
CO2: 26 mmol/L (ref 20–29)
Calcium: 9.6 mg/dL (ref 8.7–10.3)
Chloride: 95 mmol/L — ABNORMAL LOW (ref 96–106)
Creatinine, Ser: 1.06 mg/dL — ABNORMAL HIGH (ref 0.57–1.00)
GFR calc Af Amer: 64 mL/min/{1.73_m2} (ref >59)
GFR calc non Af Amer: 55 mL/min/{1.73_m2} — ABNORMAL LOW (ref >59)
Glucose: 120 mg/dL — ABNORMAL HIGH (ref 65–99)
Potassium: 4.6 mmol/L (ref 3.5–5.2)
Sodium: 138 mmol/L (ref 134–144)

## 2019-08-11 ENCOUNTER — Other Ambulatory Visit: Payer: Self-pay | Admitting: Surgery

## 2019-08-11 DIAGNOSIS — Z952 Presence of prosthetic heart valve: Secondary | ICD-10-CM

## 2019-08-12 ENCOUNTER — Ambulatory Visit
Admission: RE | Admit: 2019-08-12 | Discharge: 2019-08-12 | Disposition: A | Discharge status: Home / Self Care | Payer: Commercial Managed Care - PPO | Source: Ambulatory Visit | Attending: Surgery | Admitting: Surgery

## 2019-08-12 ENCOUNTER — Other Ambulatory Visit: Payer: Self-pay

## 2019-08-12 ENCOUNTER — Ambulatory Visit (INDEPENDENT_AMBULATORY_CARE_PROVIDER_SITE_OTHER): Payer: Self-pay | Admitting: Surgery

## 2019-08-12 ENCOUNTER — Encounter: Payer: Self-pay | Admitting: Surgery

## 2019-08-12 VITALS — BP 139/85 | HR 87 | Temp 97.9°F | Resp 16 | Ht 62.0 in | Wt 139.0 lb

## 2019-08-12 DIAGNOSIS — Z953 Presence of xenogenic heart valve: Secondary | ICD-10-CM

## 2019-08-12 DIAGNOSIS — Z952 Presence of prosthetic heart valve: Secondary | ICD-10-CM

## 2019-08-12 DIAGNOSIS — I350 Nonrheumatic aortic (valve) stenosis: Secondary | ICD-10-CM

## 2019-08-12 NOTE — Progress Notes (Signed)
HPI: Patient returns for routine postoperative follow-up having undergone aortic valve replacement using a 21 mm pericardial valve and clipping of left atrial appendage on 07/02/2019. The patient's early postoperative recovery while in the hospital was notable for development of postoperative atrial fibrillation converted with amiodarone.  She was discharged on Eliquis. Since hospital discharge the patient reports that she has been feeling well.  She is walking daily without chest pain or shortness of breath..   Current Outpatient Medications  Medication Sig Dispense Refill  . acetaminophen (TYLENOL) 500 MG tablet Take 500-1,000 mg by mouth every 6 (six) hours as needed for headache (pain).    . Acetylcysteine (NAC PO) Take 3 tablets by mouth daily.     [name] albuterol (VENTOLIN HFA) 108 (90 Base) MCG/ACT inhaler Inhale 2 puffs into the lungs every 6 (six) hours as needed for wheezing or shortness of breath.    [name] amiodarone (PACERONE) 200 MG tablet Continue 400mg  (2 tabs) twice a day for 2 days, then take 200mg  (1 tab) twice a day for one week, and then take 1 tab (200mg ) daily until we see you in the clinic. 70 tablet 1  . amphetamine-dextroamphetamine (ADDERALL) 20 MG tablet Take 10-20 mg by mouth daily as needed (ADD and/or hypersomnolence).     [name] aspirin EC 325 MG EC tablet Take 1 tablet (325 mg total) by mouth daily. 30 tablet 0  . buPROPion (WELLBUTRIN XL) 300 MG 24 hr tablet Take 300 mg by mouth every morning.    . carvedilol (COREG) 3.125 MG tablet Take 1 tablet (3.125 mg total) by mouth 2 (two) times daily with a meal. 60 tablet 1  . ELIQUIS 5 MG TABS tablet TAKE 1 TABLET BY MOUTH  TWICE DAILY 180 tablet 3  . gabapentin (NEURONTIN) 600 MG tablet Take 300 mg by mouth at bedtime as needed for pain.    [name] LORazepam (ATIVAN) 1 MG tablet Take 1 mg by mouth at bedtime as needed for anxiety or sleep.     [name] ondansetron (ZOFRAN) 8 MG tablet Take 4-8 mg by mouth every 8 (eight) hours as needed  (nausea while traveling).    [name] oxyCODONE (OXY IR/ROXICODONE) 5 MG immediate release tablet Take 1 tablet (5 mg total) by mouth every 6 (six) hours as needed for severe pain. 30 tablet 0  . oxymetazoline (AFRIN) 0.05 % nasal spray Place 1 spray into both nostrils daily as needed (allergies.).     [name] potassium chloride SA (K-DUR,KLOR-CON) 20 MEQ tablet Take 20 mEq by mouth daily as needed (when takes lasix).   3  . pramipexole (MIRAPEX) 1.5 MG tablet Take 0.375-1.5 tablets by mouth at bedtime as needed (restless legs).   0  . Prenatal Vit-Fe Fumarate-FA (MULTIVITAMIN-PRENATAL) 27-0.8 MG TABS tablet Take 1 tablet by mouth daily at 12 noon.    [name] VITAMIN A PO Take 1 capsule by mouth 3 (three) times a week.     . Vitamin D, Ergocalciferol, (DRISDOL) 1.25 MG (50000 UNIT) CAPS capsule Take 50,000 Units by mouth every 7 (seven) days.     No current facility-administered medications for this visit.    Physical Exam: BP 139/85 (BP Location: Right Arm, Patient Position: Sitting, Cuff Size: Normal)   Pulse 87   Temp 97.9 F (36.6 C) (Temporal)   Resp 16   Ht 5\' 2"  (1.575 m)   Wt 139 lb (63 kg)   LMP  (LMP Unknown)   SpO2 96% Comment: RA  BMI 25.42 kg/m  She looks well. Cardiac exam shows a regular rate and rhythm.  There is no murmur. Lung exam is clear. The chest incision is healing well and the sternum is stable. There is no peripheral edema.  Diagnostic Tests:  CLINICAL DATA:  Status post aortic valve replacement on 07/02/2019. Bibasilar atelectasis and small right effusion on the prior exam.  EXAM: CHEST - 2 VIEW  COMPARISON:  Chest x-ray dated 07/12/2019 and 06/30/2019  FINDINGS: The heart size and pulmonary vascularity are normal. Resolution of the tiny right effusion. Almost complete resolution of the bibasilar atelectasis. Lungs are otherwise clear. Heart size and vascularity are normal. No bone abnormality.  Aortic valve prosthesis.  Clip on the left atrial  appendage.  IMPRESSION: Almost complete resolution of the bibasilar atelectasis. Otherwise normal.   Electronically Signed   By: Lorriane Shire M.D.   On: 08/12/2019 12:30   Impression:  Overall she is making good recovery following aortic valve replacement surgery.  She appears to be in sinus rhythm today.  She has already returned to driving a car.  I asked her not to lift anything greater than 10 pounds for 3 months postoperatively.  Plan:  She will continue to follow-up with cardiology and will contact me if she develops any problems with her incisions.   Gaye Pollack, MD [facility] [phone]

## 2019-08-25 ENCOUNTER — Ambulatory Visit (INDEPENDENT_AMBULATORY_CARE_PROVIDER_SITE_OTHER): Payer: Commercial Managed Care - PPO | Admitting: Cardiology

## 2019-08-25 ENCOUNTER — Other Ambulatory Visit: Payer: Self-pay

## 2019-08-25 ENCOUNTER — Encounter: Payer: Self-pay | Admitting: Cardiology

## 2019-08-25 DIAGNOSIS — I480 Paroxysmal atrial fibrillation: Secondary | ICD-10-CM

## 2019-08-25 DIAGNOSIS — Z953 Presence of xenogenic heart valve: Secondary | ICD-10-CM

## 2019-08-25 DIAGNOSIS — R0609 Other forms of dyspnea: Secondary | ICD-10-CM

## 2019-08-25 DIAGNOSIS — R0600 Dyspnea, unspecified: Secondary | ICD-10-CM

## 2019-08-25 MED ORDER — AMIODARONE HCL 200 MG PO TABS: ORAL_TABLET | ORAL | 1 refills | Status: DC

## 2019-08-25 NOTE — Patient Instructions (Signed)
Medication Instructions:  Your physician has recommended you make the following change in your medication:   DECREASE: Amiodarone 200 mg daily   *If you need a refill on your cardiac medications before your next appointment, please call your pharmacy*   Lab Work: Your physician recommends that you return for lab work today: bmp, pro bnp   If you have labs (blood work) drawn today and your tests are completely normal, you will receive your results only by: [name] MyChart Message (if you have MyChart) OR . A paper copy in the mail If you have any lab test that is abnormal or we need to change your treatment, we will call you to review the results.   Testing/Procedures: Your physician has requested that you have an echocardiogram. Echocardiography is a painless test that uses sound waves to create images of your heart. It provides your doctor with information about the size and shape of your heart and how well your heart's chambers and valves are working. This procedure takes approximately one hour. There are no restrictions for this procedure.     Follow-Up: At [facility], you and your health needs are our priority.  As part of our continuing mission to provide you with exceptional heart care, we have created designated Provider Care Teams.  These Care Teams include your primary Cardiologist (physician) and Advanced Practice Providers (APPs -  Physician Assistants and Nurse Practitioners) who all work together to provide you with the care you need, when you need it.  We recommend signing up for the patient portal called "MyChart".  Sign up information is provided on this After Visit Summary.  MyChart is used to connect with patients for Virtual Visits (Telemedicine).  Patients are able to view lab/test results, encounter notes, upcoming appointments, etc.  Non-urgent messages can be sent to your provider as well.   To learn more about what you can do with MyChart, go to  [email or web address].    Your next appointment:   6 week(s)  The format for your next appointment:   In Person  Provider:   Jenne Campus, MD   Other Instructions   Echocardiogram An echocardiogram is a procedure that uses painless sound waves (ultrasound) to produce an image of the heart. Images from an echocardiogram can provide important information about:  Signs of coronary artery disease (CAD).  Aneurysm detection. An aneurysm is a weak or damaged part of an artery wall that bulges out from the normal force of blood pumping through the body.  Heart size and shape. Changes in the size or shape of the heart can be associated with certain conditions, including heart failure, aneurysm, and CAD.  Heart muscle function.  Heart valve function.  Signs of a past heart attack.  Fluid buildup around the heart.  Thickening of the heart muscle.  A tumor or infectious growth around the heart valves. Tell a health care provider about:  Any allergies you have.  All medicines you are taking, including vitamins, herbs, eye drops, creams, and over-the-counter medicines.  Any blood disorders you have.  Any surgeries you have had.  Any medical conditions you have.  Whether you are pregnant or may be pregnant. What are the risks? Generally, this is a safe procedure. However, problems may occur, including:  Allergic reaction to dye (contrast) that may be used during the procedure. What happens before the procedure? No specific preparation is needed. You may eat and drink normally. What happens during the procedure?   An IV tube may  be inserted into one of your veins.  You may receive contrast through this tube. A contrast is an injection that improves the quality of the pictures from your heart.  A gel will be applied to your chest.  A wand-like tool (transducer) will be moved over your chest. The gel will help to transmit the sound waves from the transducer.  The  sound waves will harmlessly bounce off of your heart to allow the heart images to be captured in real-time motion. The images will be recorded on a computer. The procedure may vary among health care providers and hospitals. What happens after the procedure?  You may return to your normal, everyday life, including diet, activities, and medicines, unless your health care provider tells you not to do that. Summary  An echocardiogram is a procedure that uses painless sound waves (ultrasound) to produce an image of the heart.  Images from an echocardiogram can provide important information about the size and shape of your heart, heart muscle function, heart valve function, and fluid buildup around your heart.  You do not need to do anything to prepare before this procedure. You may eat and drink normally.  After the echocardiogram is completed, you may return to your normal, everyday life, unless your health care provider tells you not to do that. This information is not intended to replace advice given to you by your health care provider. Make sure you discuss any questions you have with your health care provider. Document Revised: 06/19/2018 Document Reviewed: 03/31/2016 Elsevier Patient Education  [place].

## 2019-08-25 NOTE — Progress Notes (Signed)
Cardiology Office Note:    Date:  08/25/2019   ID:  Lindsay Guerrero, DOB [birth year], MRN [id number]  PCP:  Bonnita Nasuti, MD  Cardiologist:  Jenne Campus, MD    Referring MD: Bonnita Nasuti, MD   Chief Complaint  Patient presents with  . Follow-up    1 MO FU   Doing much better  History of Present Illness:    Lindsay Guerrero is a 64 y.o. female with past medical history significant for aortic stenosis which is severe, 2 months ago she got aortic valve replacement with bioprosthesis 25 mm Edwards prosthesis.  Hospital course was complicated by atrial fibrillation.  After that she got burst of atrial fibrillation as well as some decompensated congestive heart failure.  Things seems to be getting better right now.  Denies have any palpitations.  No shortness of breath no swelling of lower extremities.  She is feeling good.  Past Medical History:  Diagnosis Date  . Adult attention deficit disorder 05/15/2013  . Anxiety state 11/19/2012   ICD10  . Aortic stenosis 12/13/2017   Moderate by echocardiogram from 2019 mean gradient 25  . Aortic stenosis, mild 07/13/2016   Moderate by echocardiogram from 2019 mean gradient 25  . Aortic stenosis, severe 07/02/2019  . Aortic valve sclerosis 05/24/2015   Formatting of this note might be different from the original. Without stenosis. Last Echo 2012  . Asthma    mild  . Atrial fibrillation ([place]) 06/28/2018  . Atrial fibrillation with RVR ([place]) 06/28/2018  . CHF (congestive heart failure) ([place])   . Chronic bilateral low back pain 01/27/2015  . Depression   . Diabetes mellitus without complication (HCC)    borderline  . Dyspnea    on exertion  . Dyspnea on exertion 11/01/2017  . Elective surgery for purposes other than treating health conditions 09/27/2010   ICD10  . Essential hypertension 06/30/2018  . Family history of colon cancer 03/01/2005  . Family history of premature coronary artery disease 03/01/2005  . GAD (generalized anxiety  disorder) 03/01/2005   Formatting of this note might be different from the original. with panic attacks  . Hepatitis    due to cytomegalovirus-resolved  . History of breast cancer 06/30/2018  . History of hiatal hernia    mild,found on a CT  . History of malignant neoplasm of breast 03/01/2005   Formatting of this note might be different from the original. node neg; see chartnotes for treatment  . HTN (hypertension), benign 07/13/2016  . Hyperlipidemia 06/30/2018  . Hypokalemia 06/30/2018  . Inflamed seborrheic keratosis 08/08/2012  . Irritable bowel syndrome 03/01/2005  . Malignant neoplasm of upper-inner quadrant of female breast ([place]) 11/01/2017   Primary  diagnosis  . Mild intermittent asthma without complication 99/24/2683   Formatting of this note might be different from the original. mild  . Mitral regurgitation 12/13/2017   Mild to moderate based on echo from 2019  . Mixed hyperlipidemia 07/20/2019  . NICM (nonischemic cardiomyopathy) ([place]) 11/01/2015  . Osteopenia 01/27/2015  . Paroxysmal atrial fibrillation ([place]) 07/14/2019  . Pneumonia 2018  . Post menopausal problems 01/27/2015  . Precordial pain 11/01/2015  . Psoriatic arthritis ([place]) 04/25/2015  . Recurrent genital HSV (herpes simplex virus) infection 03/01/2005   Formatting of this note might be different from the original. HSV genital  . Restless legs syndrome 03/01/2005  . Rosacea 03/01/2005  . Routine history and physical examination of adult 09/22/2012   Formatting of this note might be different  from the original. PROBLEM LIST:  1. Left node negative breast cancer 2002, stage I, grade 1, status post  lumpectomy and node dissection, external beam radiation,  chemotherapy/Cytoxan, mild left arm lymphedema with cellulitis 2004.  2. Anxiety disorder with panic attacks, family history of same, globus  pharyngeus, palpitations, atypical chest pain, str  . S/P aortic valve replacement with bioprosthetic valve 21 mm Edwards INSPIRIS  RESILIA  07/02/2019   Size 21 mm  . S/P AVR (aortic valve replacement) 07/20/2019  . Seborrheic keratoses 09/10/2016  . Sleep apnea    borderline  . Vaginal dryness 01/27/2015    Past Surgical History:  Procedure Laterality Date  . AORTIC VALVE REPLACEMENT N/A 07/02/2019   Procedure: AORTIC VALVE REPLACEMENT (AVR) USING INSPIRIS VALVE SIZE 21MM;  Surgeon: Gaye Pollack, MD;  Location: [facility];  Service: Open Heart Surgery;  Laterality: N/A;  . BREAST LUMPECTOMY Left 2003   sentinal node bx.  [name] CARDIOVERSION N/A 07/10/2019   Procedure: CARDIOVERSION;  Surgeon: Donato Heinz, MD;  Location: [facility];  Service: Cardiovascular;  Laterality: N/A;  . CESAREAN SECTION    . CLIPPING OF ATRIAL APPENDAGE N/A 07/02/2019   Procedure: CLIPPING OF ATRIAL APPENDAGE USING PRO2 CLIP SIZE 40MM;  Surgeon: Gaye Pollack, MD;  Location: [facility];  Service: Open Heart Surgery;  Laterality: N/A;  . COSMETIC SURGERY    . IR THORACENTESIS ASP PLEURAL SPACE W/IMG GUIDE  07/07/2019  . RIGHT/LEFT HEART CATH AND CORONARY ANGIOGRAPHY N/A 05/20/2019   Procedure: RIGHT/LEFT HEART CATH AND CORONARY ANGIOGRAPHY;  Surgeon: [name], MD;  Location: [facility];  Service: Cardiovascular;  Laterality: N/A;  . TEE WITHOUT CARDIOVERSION N/A 07/02/2019   Procedure: TRANSESOPHAGEAL ECHOCARDIOGRAM (TEE);  Surgeon: Gaye Pollack, MD;  Location: [facility];  Service: Open Heart Surgery;  Laterality: N/A;  . TONSILLECTOMY      Current Medications: Current Meds  Medication Sig  . acetaminophen (TYLENOL) 500 MG tablet Take 500-1,000 mg by mouth every 6 (six) hours as needed for headache (pain).  . Acetylcysteine (NAC PO) Take 3 tablets by mouth daily.   [name] albuterol (VENTOLIN HFA) 108 (90 Base) MCG/ACT inhaler Inhale 2 puffs into the lungs every 6 (six) hours as needed for wheezing or shortness of breath.  [name] amiodarone (PACERONE) 200 MG tablet Continue 400mg  (2 tabs) twice a day for 2 days, then take 200mg  (1 tab)  twice a day for one week, and then take 1 tab (200mg ) daily until we see you in the clinic.  [name] amphetamine-dextroamphetamine (ADDERALL) 20 MG tablet Take 10-20 mg by mouth daily as needed (ADD and/or hypersomnolence).   [name] aspirin EC 325 MG EC tablet Take 1 tablet (325 mg total) by mouth daily.  [name] buPROPion (WELLBUTRIN XL) 300 MG 24 hr tablet Take 300 mg by mouth every morning.  . carvedilol (COREG) 3.125 MG tablet Take 1 tablet (3.125 mg total) by mouth 2 (two) times daily with a meal.  . ELIQUIS 5 MG TABS tablet TAKE 1 TABLET BY MOUTH  TWICE DAILY  . gabapentin (NEURONTIN) 600 MG tablet Take 300 mg by mouth at bedtime as needed for pain.  [name] LORazepam (ATIVAN) 1 MG tablet Take 1 mg by mouth at bedtime as needed for anxiety or sleep.   [name] ondansetron (ZOFRAN) 8 MG tablet Take 4-8 mg by mouth every 8 (eight) hours as needed (nausea while traveling).  [name] oxyCODONE (OXY IR/ROXICODONE) 5 MG immediate release tablet Take 1 tablet (5 mg total) by  mouth every 6 (six) hours as needed for severe pain.  [name] oxymetazoline (AFRIN) 0.05 % nasal spray Place 1 spray into both nostrils daily as needed (allergies.).   [name] pramipexole (MIRAPEX) 1.5 MG tablet Take 0.375-1.5 tablets by mouth at bedtime as needed (restless legs).   . Prenatal Vit-Fe Fumarate-FA (MULTIVITAMIN-PRENATAL) 27-0.8 MG TABS tablet Take 1 tablet by mouth daily at 12 noon.  [name] VITAMIN A PO Take 1 capsule by mouth 3 (three) times a week.   . Vitamin D, Ergocalciferol, (DRISDOL) 1.25 MG (50000 UNIT) CAPS capsule Take 50,000 Units by mouth every 7 (seven) days.     Allergies:   Lisinopril, Iodine, and Metoprolol   Social History   Socioeconomic History  . Marital status: Married    Spouse name: Not on file  . Number of children: Not on file  . Years of education: Not on file  . Highest education level: Not on file  Occupational History  . Not on file  Tobacco Use  . Smoking status: Never Smoker  . Smokeless tobacco: Never Used  Vaping Use  .  Vaping Use: Never used  Substance and Sexual Activity  . Alcohol use: Yes    Comment: occasional social drinking (once per month)   . Drug use: Never  . Sexual activity: Yes    Partners: Male    Birth control/protection: None  Other Topics Concern  . Not on file  Social History Narrative  . Not on file   Social Determinants of Health   Financial Resource Strain:   . Difficulty of Paying Living Expenses:   Food Insecurity:   . Worried About Charity fundraiser in the Last Year:   . Arboriculturist in the Last Year:   Transportation Needs:   . Film/video editor (Medical):   [name] Lack of Transportation (Non-Medical):   Physical Activity:   . Days of Exercise per Week:   . Minutes of Exercise per Session:   Stress:   . Feeling of Stress :   Social Connections:   . Frequency of Communication with Friends and Family:   . Frequency of Social Gatherings with Friends and Family:   . Attends Religious Services:   . Active Member of Clubs or Organizations:   . Attends Archivist Meetings:   [name] Marital Status:      Family History: The patient's family history includes Heart Problems in her father and mother; Heart attack in her sister; Hypertension in her mother. ROS:   Please see the history of present illness.    All 14 point review of systems negative except as described per history of present illness  EKGs/Labs/Other Studies Reviewed:      Recent Labs: 06/30/2019: ALT 19 07/09/2019: TSH 1.395 07/11/2019: Magnesium 1.8 07/29/2019: Hemoglobin 10.4; NT-Pro BNP 1,367; Platelets 448 08/05/2019: BUN 18; Creatinine, Ser 1.06; Potassium 4.6; Sodium 138  Recent Lipid Panel    Component Value Date/Time   CHOL 242 (H) 11/01/2017 1030   TRIG 175 (H) 11/01/2017 1030   HDL 47 11/01/2017 1030   CHOLHDL 5.1 (H) 11/01/2017 1030   LDLCALC 160 (H) 11/01/2017 1030    Physical Exam:    VS:  BP (!) 148/80 (BP Location: Right Arm, Patient Position: Sitting, Cuff Size: Normal)    Pulse 85   Ht 5\' 2"  (1.575 m)   Wt 140 lb 3.2 oz (63.6 kg)   LMP  (LMP Unknown)   SpO2 97%   BMI 25.64 kg/m  Wt Readings from Last 3 Encounters:  08/25/19 140 lb 3.2 oz (63.6 kg)  08/12/19 139 lb (63 kg)  07/29/19 140 lb (63.5 kg)     GEN:  Well nourished, well developed in no acute distress HEENT: Normal NECK: No JVD; No carotid bruits LYMPHATICS: No lymphadenopathy CARDIAC: RRR, no murmurs, no rubs, no gallops RESPIRATORY:  Clear to auscultation without rales, wheezing or rhonchi  ABDOMEN: Soft, non-tender, non-distended MUSCULOSKELETAL:  No edema; No deformity  SKIN: Warm and dry LOWER EXTREMITIES: no swelling NEUROLOGIC:  Alert and oriented x 3 PSYCHIATRIC:  Normal affect   ASSESSMENT:    1. S/P aortic valve replacement with bioprosthetic valve 21 mm Edwards INSPIRIS RESILIA    2. Paroxysmal atrial fibrillation (HCC)    PLAN:    In order of problems listed above:  1. Status post aortic valve replacement with bioprosthesis.  Valve sounds beautiful.  We will schedule her to have echocardiogram to establish baseline parameters. 2. Paroxysmal atrial fibrillation denies having any palpitations.  She is anticoagulated which I will continue.  I will reduce amiodarone from 200 mg twice daily to 200 mg once a day. 3. Congestive heart failure.  She seems to be compensated today she takes 20 mg of furosemide.  Will check Chem-7 as well as proBNP today. 4. We talked in length about the fact that she need to take care of herself.  I think she was pushing herself too much.  3 weeks after surgery she went to [employer] with her family few days later she ended up going to [place].  That led to atrial fibrillation as well as decompensated congestive heart failure.  We discussed this issue she understand now she need to take it easy but at the same time she is participating in rehab and I want her to do that.  Overall her recovery looks good right now.  I will see her back in about 6  weeks.  At that time probably will let her go back to work.   Medication Adjustments/Labs and Tests Ordered: Current medicines are reviewed at length with the patient today.  Concerns regarding medicines are outlined above.  No orders of the defined types were placed in this encounter.  Medication changes: No orders of the defined types were placed in this encounter.   Signed, Park Liter, MD, [facility] 08/25/2019 9:59 AM    [place]

## 2019-08-26 LAB — BASIC METABOLIC PANEL
BUN/Creatinine Ratio: 22 (ref 12–28)
BUN: 19 mg/dL (ref 8–27)
CO2: 25 mmol/L (ref 20–29)
Calcium: 9.6 mg/dL (ref 8.7–10.3)
Chloride: 99 mmol/L (ref 96–106)
Creatinine, Ser: 0.85 mg/dL (ref 0.57–1.00)
GFR calc Af Amer: 83 mL/min/{1.73_m2} (ref >59)
GFR calc non Af Amer: 72 mL/min/{1.73_m2} (ref >59)
Glucose: 126 mg/dL — ABNORMAL HIGH (ref 65–99)
Potassium: 4.6 mmol/L (ref 3.5–5.2)
Sodium: 139 mmol/L (ref 134–144)

## 2019-08-26 LAB — PRO B NATRIURETIC PEPTIDE: NT-Pro BNP: 775 pg/mL — ABNORMAL HIGH (ref 0–301)

## 2019-09-03 ENCOUNTER — Telehealth: Payer: Self-pay | Admitting: Emergency Medicine

## 2019-09-03 NOTE — Telephone Encounter (Signed)
Called patient after finding a [employer] certification of health care provider for medical leave of absence form. She reports she had already had paperwork sent to [employer] before and they were waiting for Dr. Agustin Cree to sign I informed her e have not received anything from [employer] lately she is going to check with them and let us know.

## 2019-09-22 ENCOUNTER — Telehealth: Payer: Self-pay | Admitting: Cardiology

## 2019-09-22 ENCOUNTER — Encounter: Payer: Self-pay | Admitting: Emergency Medicine

## 2019-09-22 NOTE — Telephone Encounter (Signed)
Called patient back. She reports that she needs a letter from Dr. Agustin Cree that states she has not been cleared to go back to work yet so it can be sent to her employer. She needed this last Friday.   Furthermore she plans to reach out to [employer] to see what is outstanding and let us know what else is needed. No further questions will let Dr. Agustin Cree know.

## 2019-09-22 NOTE — Telephone Encounter (Signed)
Patient calling in regards to her mychart messages about her leave of absense. She states she has not heard anything and sent two messages.

## 2019-09-22 NOTE — Telephone Encounter (Signed)
This has been addressed.

## 2019-09-29 ENCOUNTER — Other Ambulatory Visit: Payer: Self-pay

## 2019-09-29 ENCOUNTER — Ambulatory Visit (INDEPENDENT_AMBULATORY_CARE_PROVIDER_SITE_OTHER): Payer: Commercial Managed Care - PPO

## 2019-09-29 DIAGNOSIS — E119 Type 2 diabetes mellitus without complications: Secondary | ICD-10-CM | POA: Insufficient documentation

## 2019-09-29 DIAGNOSIS — G4730 Sleep apnea, unspecified: Secondary | ICD-10-CM | POA: Insufficient documentation

## 2019-09-29 DIAGNOSIS — R0600 Dyspnea, unspecified: Secondary | ICD-10-CM | POA: Insufficient documentation

## 2019-09-29 DIAGNOSIS — F32A Depression, unspecified: Secondary | ICD-10-CM | POA: Insufficient documentation

## 2019-09-29 DIAGNOSIS — I509 Heart failure, unspecified: Secondary | ICD-10-CM | POA: Insufficient documentation

## 2019-09-29 DIAGNOSIS — I480 Paroxysmal atrial fibrillation: Secondary | ICD-10-CM | POA: Diagnosis not present

## 2019-09-29 DIAGNOSIS — G4733 Obstructive sleep apnea (adult) (pediatric): Secondary | ICD-10-CM | POA: Insufficient documentation

## 2019-09-29 DIAGNOSIS — E1169 Type 2 diabetes mellitus with other specified complication: Secondary | ICD-10-CM | POA: Insufficient documentation

## 2019-09-29 DIAGNOSIS — Z953 Presence of xenogenic heart valve: Secondary | ICD-10-CM

## 2019-09-29 DIAGNOSIS — Z8719 Personal history of other diseases of the digestive system: Secondary | ICD-10-CM | POA: Insufficient documentation

## 2019-09-29 DIAGNOSIS — J45909 Unspecified asthma, uncomplicated: Secondary | ICD-10-CM | POA: Insufficient documentation

## 2019-09-29 DIAGNOSIS — K759 Inflammatory liver disease, unspecified: Secondary | ICD-10-CM | POA: Insufficient documentation

## 2019-09-29 LAB — ECHOCARDIOGRAM COMPLETE
AR max vel: 0.85 cm2
AV Area VTI: 0.93 cm2
AV Area mean vel: 0.95 cm2
AV Mean grad: 7 mmHg
AV Peak grad: 13.2 mmHg
Ao pk vel: 1.82 m/s
Area-P 1/2: 3.42 cm2
S' Lateral: 2.8 cm

## 2019-10-02 ENCOUNTER — Encounter: Payer: Self-pay | Admitting: Cardiology

## 2019-10-02 ENCOUNTER — Other Ambulatory Visit: Payer: Self-pay

## 2019-10-02 ENCOUNTER — Ambulatory Visit (INDEPENDENT_AMBULATORY_CARE_PROVIDER_SITE_OTHER): Payer: Commercial Managed Care - PPO | Admitting: Cardiology

## 2019-10-02 VITALS — BP 124/88 | HR 92 | Ht 62.0 in | Wt 143.0 lb

## 2019-10-02 DIAGNOSIS — I428 Other cardiomyopathies: Secondary | ICD-10-CM | POA: Diagnosis not present

## 2019-10-02 DIAGNOSIS — I480 Paroxysmal atrial fibrillation: Secondary | ICD-10-CM | POA: Diagnosis not present

## 2019-10-02 DIAGNOSIS — I4891 Unspecified atrial fibrillation: Secondary | ICD-10-CM | POA: Diagnosis not present

## 2019-10-02 DIAGNOSIS — Z953 Presence of xenogenic heart valve: Secondary | ICD-10-CM | POA: Diagnosis not present

## 2019-10-02 MED ORDER — AMIODARONE HCL 100 MG PO TABS: ORAL_TABLET | ORAL | 1 refills | Status: DC

## 2019-10-02 MED ORDER — AMOXICILLIN 500 MG PO CAPS
ORAL_CAPSULE | ORAL | 5 refills | Status: DC
Start: 2019-10-02 — End: 2020-10-05

## 2019-10-02 NOTE — Progress Notes (Signed)
Cardiology Office Note:    Date:  10/02/2019   ID:  Lindsay Guerrero, DOB 01/29/1956, MRN [id number]  PCP:  Bonnita Nasuti, MD  Cardiologist:  Jenne Campus, MD    Referring MD: Bonnita Nasuti, MD   No chief complaint on file. I am doing much better  History of Present Illness:    Lindsay Guerrero is a 64 y.o. female with past medical history significant for arctic stenosis which was severe, status post aortic valve replacement 3 months ago with 25 mm bioprosthetic Edwards valve.  Hospital course was complicated by atrial fibrillation.  Also in the recovery phase she ended up having some decompensated congestive heart failure.  She is coming today to my office for follow-up.  Overall she is doing well.  Denies have any palpitations no chest pain tightness squeezing pressure burning chest no swelling of lower extremities.  Past Medical History:  Diagnosis Date  . Adult attention deficit disorder 05/15/2013  . Anxiety state 11/19/2012   ICD10  . Aortic stenosis 12/13/2017   Moderate by echocardiogram from 2019 mean gradient 25  . Aortic stenosis, mild 07/13/2016   Moderate by echocardiogram from 2019 mean gradient 25  . Aortic stenosis, severe 07/02/2019  . Aortic valve sclerosis 05/24/2015   Formatting of this note might be different from the original. Without stenosis. Last Echo 2012  . Asthma    mild  . Atrial fibrillation ([place]) 06/28/2018  . Atrial fibrillation with RVR ([place]) 06/28/2018  . CHF (congestive heart failure) ([place])   . Chronic bilateral low back pain 01/27/2015  . Depression   . Diabetes mellitus without complication (HCC)    borderline  . Dyspnea    on exertion  . Dyspnea on exertion 11/01/2017  . Elective surgery for purposes other than treating health conditions 09/27/2010   ICD10  . Essential hypertension 06/30/2018  . Family history of colon cancer 03/01/2005  . Family history of premature coronary artery disease 03/01/2005  . GAD (generalized anxiety disorder)  03/01/2005   Formatting of this note might be different from the original. with panic attacks  . Hepatitis    due to cytomegalovirus-resolved  . History of breast cancer 06/30/2018  . History of hiatal hernia    mild,found on a CT  . History of malignant neoplasm of breast 03/01/2005   Formatting of this note might be different from the original. node neg; see chartnotes for treatment  . HTN (hypertension), benign 07/13/2016  . Hyperlipidemia 06/30/2018  . Hypokalemia 06/30/2018  . Inflamed seborrheic keratosis 08/08/2012  . Irritable bowel syndrome 03/01/2005  . Malignant neoplasm of upper-inner quadrant of female breast ([place]) 11/01/2017   Primary  diagnosis  . Mild intermittent asthma without complication 16/38/4536   Formatting of this note might be different from the original. mild  . Mitral regurgitation 12/13/2017   Mild to moderate based on echo from 2019  . Mixed hyperlipidemia 07/20/2019  . NICM (nonischemic cardiomyopathy) ([place]) 11/01/2015  . Osteopenia 01/27/2015  . Paroxysmal atrial fibrillation ([place]) 07/14/2019  . Pneumonia 2018  . Post menopausal problems 01/27/2015  . Precordial pain 11/01/2015  . Psoriatic arthritis ([place]) 04/25/2015  . Recurrent genital HSV (herpes simplex virus) infection 03/01/2005   Formatting of this note might be different from the original. HSV genital  . Restless legs syndrome 03/01/2005  . Rosacea 03/01/2005  . Routine history and physical examination of adult 09/22/2012   Formatting of this note might be different from the original. PROBLEM LIST:  1.  Left node negative breast cancer 2002, stage I, grade 1, status post  lumpectomy and node dissection, external beam radiation,  chemotherapy/Cytoxan, mild left arm lymphedema with cellulitis 2004.  2. Anxiety disorder with panic attacks, family history of same, globus  pharyngeus, palpitations, atypical chest pain, str  . S/P aortic valve replacement with bioprosthetic valve 21 mm Edwards INSPIRIS RESILIA   07/02/2019   Size 21 mm  . S/P AVR (aortic valve replacement) 07/20/2019  . Seborrheic keratoses 09/10/2016  . Sleep apnea    borderline  . Vaginal dryness 01/27/2015    Past Surgical History:  Procedure Laterality Date  . AORTIC VALVE REPLACEMENT N/A 07/02/2019   Procedure: AORTIC VALVE REPLACEMENT (AVR) USING INSPIRIS VALVE SIZE 21MM;  Surgeon: Gaye Pollack, MD;  Location: [facility];  Service: Open Heart Surgery;  Laterality: N/A;  . BREAST LUMPECTOMY Left 2003   sentinal node bx.  [name] CARDIOVERSION N/A 07/10/2019   Procedure: CARDIOVERSION;  Surgeon: Donato Heinz, MD;  Location: [facility];  Service: Cardiovascular;  Laterality: N/A;  . CESAREAN SECTION    . CLIPPING OF ATRIAL APPENDAGE N/A 07/02/2019   Procedure: CLIPPING OF ATRIAL APPENDAGE USING PRO2 CLIP SIZE 40MM;  Surgeon: Gaye Pollack, MD;  Location: [facility];  Service: Open Heart Surgery;  Laterality: N/A;  . COSMETIC SURGERY    . IR THORACENTESIS ASP PLEURAL SPACE W/IMG GUIDE  07/07/2019  . RIGHT/LEFT HEART CATH AND CORONARY ANGIOGRAPHY N/A 05/20/2019   Procedure: RIGHT/LEFT HEART CATH AND CORONARY ANGIOGRAPHY;  Surgeon: [name], MD;  Location: [facility];  Service: Cardiovascular;  Laterality: N/A;  . TEE WITHOUT CARDIOVERSION N/A 07/02/2019   Procedure: TRANSESOPHAGEAL ECHOCARDIOGRAM (TEE);  Surgeon: Gaye Pollack, MD;  Location: [place];  Service: Open Heart Surgery;  Laterality: N/A;  . TONSILLECTOMY      Current Medications: Current Meds  Medication Sig  . acetaminophen (TYLENOL) 500 MG tablet Take 500-1,000 mg by mouth every 6 (six) hours as needed for headache (pain).  . Acetylcysteine (NAC PO) Take 3 tablets by mouth daily.   [name] albuterol (VENTOLIN HFA) 108 (90 Base) MCG/ACT inhaler Inhale 2 puffs into the lungs every 6 (six) hours as needed for wheezing or shortness of breath.  [name] amiodarone (PACERONE) 100 MG tablet Take 100 mg daily  . amphetamine-dextroamphetamine (ADDERALL) 20 MG tablet Take  10-20 mg by mouth daily as needed (ADD and/or hypersomnolence).   [name] aspirin EC 325 MG EC tablet Take 1 tablet (325 mg total) by mouth daily.  [name] buPROPion (WELLBUTRIN XL) 300 MG 24 hr tablet Take 300 mg by mouth every morning.  . carvedilol (COREG) 3.125 MG tablet Take 1 tablet (3.125 mg total) by mouth 2 (two) times daily with a meal.  . ELIQUIS 5 MG TABS tablet TAKE 1 TABLET BY MOUTH  TWICE DAILY  . furosemide (LASIX) 20 MG tablet Take 20 mg by mouth daily.  [name] gabapentin (NEURONTIN) 600 MG tablet Take 300 mg by mouth at bedtime as needed for pain.  [name] LORazepam (ATIVAN) 1 MG tablet Take 1 mg by mouth at bedtime as needed for anxiety or sleep.   [name] ondansetron (ZOFRAN) 8 MG tablet Take 4-8 mg by mouth every 8 (eight) hours as needed (nausea while traveling).  [name] oxyCODONE (OXY IR/ROXICODONE) 5 MG immediate release tablet Take 1 tablet (5 mg total) by mouth every 6 (six) hours as needed for severe pain.  [name] oxymetazoline (AFRIN) 0.05 % nasal spray Place 1 spray into both nostrils daily  as needed (allergies.).   [name] pramipexole (MIRAPEX) 1.5 MG tablet Take 0.375-1.5 tablets by mouth at bedtime as needed (restless legs).   . Prenatal Vit-Fe Fumarate-FA (MULTIVITAMIN-PRENATAL) 27-0.8 MG TABS tablet Take 1 tablet by mouth daily at 12 noon.  [name] VITAMIN A PO Take 1 capsule by mouth 3 (three) times a week.   . Vitamin D, Ergocalciferol, (DRISDOL) 1.25 MG (50000 UNIT) CAPS capsule Take 50,000 Units by mouth every 7 (seven) days.  . [DISCONTINUED] amiodarone (PACERONE) 200 MG tablet Take 200 mg daily     Allergies:   Lisinopril, Iodine, and Metoprolol   Social History   Socioeconomic History  . Marital status: Married    Spouse name: Not on file  . Number of children: Not on file  . Years of education: Not on file  . Highest education level: Not on file  Occupational History  . Not on file  Tobacco Use  . Smoking status: Never Smoker  . Smokeless tobacco: Never Used  Vaping Use  . Vaping Use: Never  used  Substance and Sexual Activity  . Alcohol use: Yes    Comment: occasional social drinking (once per month)   . Drug use: Never  . Sexual activity: Yes    Partners: Male    Birth control/protection: None  Other Topics Concern  . Not on file  Social History Narrative  . Not on file   Social Determinants of Health   Financial Resource Strain:   . Difficulty of Paying Living Expenses:   Food Insecurity:   . Worried About Charity fundraiser in the Last Year:   . Arboriculturist in the Last Year:   Transportation Needs:   . Film/video editor (Medical):   [name] Lack of Transportation (Non-Medical):   Physical Activity:   . Days of Exercise per Week:   . Minutes of Exercise per Session:   Stress:   . Feeling of Stress :   Social Connections:   . Frequency of Communication with Friends and Family:   . Frequency of Social Gatherings with Friends and Family:   . Attends Religious Services:   . Active Member of Clubs or Organizations:   . Attends Archivist Meetings:   [name] Marital Status:      Family History: The patient's family history includes Heart Problems in her father and mother; Heart attack in her sister; Hypertension in her mother. ROS:   Please see the history of present illness.    All 14 point review of systems negative except as described per history of present illness  EKGs/Labs/Other Studies Reviewed:    Echocardiogram done on 09/29/2019 showed: 1. Left ventricular ejection fraction, by estimation, is 60 to 65%. The  left ventricle has normal function. The left ventricle has no regional  wall motion abnormalities. There is mild left ventricular hypertrophy.  Left ventricular diastolic parameters  are consistent with Grade II diastolic dysfunction (pseudonormalization).  2. Right ventricular systolic function is normal. The right ventricular  size is normal. There is normal pulmonary artery systolic pressure.  3. The mitral valve is normal in  structure. Mild mitral valve  regurgitation. No evidence of mitral stenosis.  4. Calculation presented underestimate AVA of the prostesis secondary to  falsy low LVOT diameter. Using into calculation 21 mm (size of the valve)  calculated AVA is 1.58cm2. Normal valve function.. The aortic valve is  normal in structure. Aortic valve  regurgitation is not visualized. No aortic stenosis is present. Aortic  valve mean gradient measures 7.0 mmHg. Aortic valve Vmax measures 1.82  m/s.  5. The inferior vena cava is normal in size with greater than 50%  respiratory variability, suggesting right atrial pressure of 3 mmHg.  Recent Labs: 06/30/2019: ALT 19 07/09/2019: TSH 1.395 07/11/2019: Magnesium 1.8 07/29/2019: Hemoglobin 10.4; Platelets 448 08/25/2019: BUN 19; Creatinine, Ser 0.85; NT-Pro BNP 775; Potassium 4.6; Sodium 139  Recent Lipid Panel    Component Value Date/Time   CHOL 242 (H) 11/01/2017 1030   TRIG 175 (H) 11/01/2017 1030   HDL 47 11/01/2017 1030   CHOLHDL 5.1 (H) 11/01/2017 1030   LDLCALC 160 (H) 11/01/2017 1030    Physical Exam:    VS:  BP (!) 124/88 (BP Location: Left Arm, Patient Position: Sitting, Cuff Size: Normal)   Pulse 92   Ht 5\' 2"  (1.575 m)   Wt 143 lb (64.9 kg)   LMP  (LMP Unknown)   SpO2 97%   BMI 26.16 kg/m     Wt Readings from Last 3 Encounters:  10/02/19 143 lb (64.9 kg)  08/25/19 140 lb 3.2 oz (63.6 kg)  08/12/19 139 lb (63 kg)     GEN:  Well nourished, well developed in no acute distress HEENT: Normal NECK: No JVD; No carotid bruits LYMPHATICS: No lymphadenopathy CARDIAC: RRR, no murmurs, no rubs, no gallops RESPIRATORY:  Clear to auscultation without rales, wheezing or rhonchi  ABDOMEN: Soft, non-tender, non-distended MUSCULOSKELETAL:  No edema; No deformity  SKIN: Warm and dry LOWER EXTREMITIES: no swelling NEUROLOGIC:  Alert and oriented x 3 PSYCHIATRIC:  Normal affect   ASSESSMENT:    1. Atrial fibrillation with RVR ([place])   2. S/P  aortic valve replacement with bioprosthetic valve 21 mm Edwards INSPIRIS RESILIA    3. Paroxysmal atrial fibrillation (HCC)   4. NICM (nonischemic cardiomyopathy) ([place])    PLAN:    In order of problems listed above:  1. Status post aortic valve replacement.  Doing well from that point review.  Valve functioning properly she is doing better. 2. Paroxysmal atrial fibrillation.  Maintaining sinus rhythm.  I will ask her to cut down her amiodarone from 200 mg to 100 mg daily.  We will continue anticoagulation. 3. History of nonischemic cardiomyopathy but last estimation of ejection fraction 6065%.  We will continue present management. 4. She is asking me about dental work I told her she need to take antibiotic prophylaxis 2 g of amoxicillin was given as prescription for her I told her to wait few months before she can see her dentist. 5. She participated in rehab however she said she been very busy with the family (many times she did not have a chance to go there.  I encouraged her to continue doing rehab.   Medication Adjustments/Labs and Tests Ordered: Current medicines are reviewed at length with the patient today.  Concerns regarding medicines are outlined above.  Orders Placed This Encounter  Procedures  . EKG 12-Lead   Medication changes:  Meds ordered this encounter  Medications  . amoxicillin (AMOXIL) 500 MG capsule    Sig: Take 2000 mg 1 hour beofre dental procedures    Dispense:  4 capsule    Refill:  5  . amiodarone (PACERONE) 100 MG tablet    Sig: Take 100 mg daily    Dispense:  90 tablet    Refill:  1    Signed, Park Liter, MD, [facility] 10/02/2019 11:45 AM    [place]

## 2019-10-02 NOTE — Patient Instructions (Signed)
Medication Instructions:  Your physician has recommended you make the following change in your medication:  DECREASE: amiodarone to 100 mg daily   *If you need a refill on your cardiac medications before your next appointment, please call your pharmacy*   Lab Work: None.  If you have labs (blood work) drawn today and your tests are completely normal, you will receive your results only by: [name] MyChart Message (if you have MyChart) OR . A paper copy in the mail If you have any lab test that is abnormal or we need to change your treatment, we will call you to review the results.   Testing/Procedures: None.    Follow-Up: At [facility], you and your health needs are our priority.  As part of our continuing mission to provide you with exceptional heart care, we have created designated Provider Care Teams.  These Care Teams include your primary Cardiologist (physician) and Advanced Practice Providers (APPs -  Physician Assistants and Nurse Practitioners) who all work together to provide you with the care you need, when you need it.  We recommend signing up for the patient portal called "MyChart".  Sign up information is provided on this After Visit Summary.  MyChart is used to connect with patients for Virtual Visits (Telemedicine).  Patients are able to view lab/test results, encounter notes, upcoming appointments, etc.  Non-urgent messages can be sent to your provider as well.   To learn more about what you can do with MyChart, go to [email or web address].    Your next appointment:   3 month(s)  The format for your next appointment:   In Person  Provider:   Jenne Campus, MD   Other Instructions

## 2019-10-19 ENCOUNTER — Encounter: Payer: Self-pay | Admitting: Emergency Medicine

## 2019-12-31 ENCOUNTER — Ambulatory Visit: Payer: Commercial Managed Care - PPO | Admitting: Cardiology

## 2020-01-14 ENCOUNTER — Ambulatory Visit: Payer: Commercial Managed Care - PPO | Admitting: Cardiology

## 2020-02-18 ENCOUNTER — Other Ambulatory Visit: Payer: Self-pay | Admitting: Cardiology

## 2020-03-05 ENCOUNTER — Other Ambulatory Visit: Payer: Self-pay | Admitting: Cardiology

## 2020-03-15 DIAGNOSIS — Z20828 Contact with and (suspected) exposure to other viral communicable diseases: Secondary | ICD-10-CM | POA: Diagnosis not present

## 2020-03-15 DIAGNOSIS — U071 COVID-19: Secondary | ICD-10-CM | POA: Diagnosis not present

## 2020-04-08 ENCOUNTER — Other Ambulatory Visit: Payer: Self-pay

## 2020-04-08 ENCOUNTER — Encounter: Payer: Self-pay | Admitting: Cardiology

## 2020-04-08 ENCOUNTER — Ambulatory Visit (INDEPENDENT_AMBULATORY_CARE_PROVIDER_SITE_OTHER): Payer: Medicare Other | Admitting: Cardiology

## 2020-04-08 VITALS — BP 124/82 | HR 81 | Ht 64.0 in | Wt 134.0 lb

## 2020-04-08 DIAGNOSIS — E785 Hyperlipidemia, unspecified: Secondary | ICD-10-CM | POA: Diagnosis not present

## 2020-04-08 DIAGNOSIS — I10 Essential (primary) hypertension: Secondary | ICD-10-CM

## 2020-04-08 DIAGNOSIS — I4891 Unspecified atrial fibrillation: Secondary | ICD-10-CM

## 2020-04-08 DIAGNOSIS — Z952 Presence of prosthetic heart valve: Secondary | ICD-10-CM | POA: Diagnosis not present

## 2020-04-08 DIAGNOSIS — I480 Paroxysmal atrial fibrillation: Secondary | ICD-10-CM

## 2020-04-08 NOTE — Patient Instructions (Signed)
Medication Instructions:  Your physician recommends that you continue on your current medications as directed. Please refer to the Current Medication list given to you today.  *If you need a refill on your cardiac medications before your next appointment, please call your pharmacy*   Lab Work: NONE If you have labs (blood work) drawn today and your tests are completely normal, you will receive your results only by: MyChart Message (if you have MyChart) OR A paper copy in the mail If you have any lab test that is abnormal or we need to change your treatment, we will call you to review the results.   Testing/Procedures: NONE   Follow-Up: At [facility], you and your health needs are our priority.  As part of our continuing mission to provide you with exceptional heart care, we have created designated Provider Care Teams.  These Care Teams include your primary Cardiologist (physician) and Advanced Practice Providers (APPs -  Physician Assistants and Nurse Practitioners) who all work together to provide you with the care you need, when you need it.  We recommend signing up for the patient portal called "MyChart".  Sign up information is provided on this After Visit Summary.  MyChart is used to connect with patients for Virtual Visits (Telemedicine).  Patients are able to view lab/test results, encounter notes, upcoming appointments, etc.  Non-urgent messages can be sent to your provider as well.   To learn more about what you can do with MyChart, go to [email or web address].    Your next appointment:   5 month(s)  The format for your next appointment:   In Person  Provider:   Robert Krasowski, MD    Other Instructions   

## 2020-04-08 NOTE — Progress Notes (Signed)
Cardiology Office Note:    Date:  04/08/2020   ID:  Lindsay Guerrero, DOB Jul 19, 1955, MRN [id number]  PCP:  Bonnita Nasuti, MD  Cardiologist:  Jenne Campus, MD    Referring MD: Bonnita Nasuti, MD   Chief Complaint  Patient presents with  . Follow-up  I am doing well  History of Present Illness:    Lindsay Guerrero is a 65 y.o. female with past medical history significant for aortic valve replacement secondary to severe stenosis with bioprosthetic Edwards 21 mm valve, hospital course was complicated by episode of atrial fibrillation initially suppressed with amiodarone, also anticoagulated.  She does have history of hypertension, dyslipidemia, diabetes.  Comes today 2 months for follow-up overall seems to be doing well.  She is feeling better have more energy and overall she is satisfied with the surgery.  She is going to go to [employer].  Past Medical History:  Diagnosis Date  . Adult attention deficit disorder 05/15/2013  . Anxiety state 11/19/2012   ICD10  . Aortic stenosis 12/13/2017   Moderate by echocardiogram from 2019 mean gradient 25  . Aortic stenosis, mild 07/13/2016   Moderate by echocardiogram from 2019 mean gradient 25  . Aortic stenosis, severe 07/02/2019  . Aortic valve sclerosis 05/24/2015   Formatting of this note might be different from the original. Without stenosis. Last Echo 2012  . Asthma    mild  . Atrial fibrillation ([place]) 06/28/2018  . Atrial fibrillation with RVR ([place]) 06/28/2018  . CHF (congestive heart failure) ([place])   . Chronic bilateral low back pain 01/27/2015  . Depression   . Diabetes mellitus without complication (HCC)    borderline  . Dyspnea    on exertion  . Dyspnea on exertion 11/01/2017  . Elective surgery for purposes other than treating health conditions 09/27/2010   ICD10  . Essential hypertension 06/30/2018  . Family history of colon cancer 03/01/2005  . Family history of premature coronary artery disease 03/01/2005  . GAD  (generalized anxiety disorder) 03/01/2005   Formatting of this note might be different from the original. with panic attacks  . Hepatitis    due to cytomegalovirus-resolved  . History of breast cancer 06/30/2018  . History of hiatal hernia    mild,found on a CT  . History of malignant neoplasm of breast 03/01/2005   Formatting of this note might be different from the original. node neg; see chartnotes for treatment  . HTN (hypertension), benign 07/13/2016  . Hyperlipidemia 06/30/2018  . Hypokalemia 06/30/2018  . Inflamed seborrheic keratosis 08/08/2012  . Irritable bowel syndrome 03/01/2005  . Malignant neoplasm of upper-inner quadrant of female breast ([place]) 11/01/2017   Primary  diagnosis  . Mild intermittent asthma without complication [id number]   Formatting of this note might be different from the original. mild  . Mitral regurgitation 12/13/2017   Mild to moderate based on echo from 2019  . Mixed hyperlipidemia 07/20/2019  . NICM (nonischemic cardiomyopathy) ([place]) 11/01/2015  . Osteopenia 01/27/2015  . Paroxysmal atrial fibrillation ([place]) 07/14/2019  . Pneumonia 2018  . Post menopausal problems 01/27/2015  . Precordial pain 11/01/2015  . Psoriatic arthritis ([place]) 04/25/2015  . Recurrent genital HSV (herpes simplex virus) infection 03/01/2005   Formatting of this note might be different from the original. HSV genital  . Restless legs syndrome 03/01/2005  . Rosacea 03/01/2005  . Routine history and physical examination of adult 09/22/2012   Formatting of this note might be different from the original. PROBLEM  LIST:  1. Left node negative breast cancer 2002, stage I, grade 1, status post  lumpectomy and node dissection, external beam radiation,  chemotherapy/Cytoxan, mild left arm lymphedema with cellulitis 2004.  2. Anxiety disorder with panic attacks, family history of same, globus  pharyngeus, palpitations, atypical chest pain, str  . S/P aortic valve replacement with bioprosthetic valve 21  mm Edwards INSPIRIS RESILIA  07/02/2019   Size 21 mm  . S/P AVR (aortic valve replacement) 07/20/2019  . Seborrheic keratoses 09/10/2016  . Sleep apnea    borderline  . Vaginal dryness 01/27/2015    Past Surgical History:  Procedure Laterality Date  . AORTIC VALVE REPLACEMENT N/A 07/02/2019   Procedure: AORTIC VALVE REPLACEMENT (AVR) USING INSPIRIS VALVE SIZE 21MM;  Surgeon: Gaye Pollack, MD;  Location: [facility];  Service: Open Heart Surgery;  Laterality: N/A;  . BREAST LUMPECTOMY Left 2003   sentinal node bx.  [name] CARDIOVERSION N/A 07/10/2019   Procedure: CARDIOVERSION;  Surgeon: Donato Heinz, MD;  Location: [facility];  Service: Cardiovascular;  Laterality: N/A;  . CESAREAN SECTION    . CLIPPING OF ATRIAL APPENDAGE N/A 07/02/2019   Procedure: CLIPPING OF ATRIAL APPENDAGE USING PRO2 CLIP SIZE 40MM;  Surgeon: Gaye Pollack, MD;  Location: [facility];  Service: Open Heart Surgery;  Laterality: N/A;  . COSMETIC SURGERY    . IR THORACENTESIS ASP PLEURAL SPACE W/IMG GUIDE  07/07/2019  . RIGHT/LEFT HEART CATH AND CORONARY ANGIOGRAPHY N/A 05/20/2019   Procedure: RIGHT/LEFT HEART CATH AND CORONARY ANGIOGRAPHY;  Surgeon: [name], MD;  Location: [facility];  Service: Cardiovascular;  Laterality: N/A;  . TEE WITHOUT CARDIOVERSION N/A 07/02/2019   Procedure: TRANSESOPHAGEAL ECHOCARDIOGRAM (TEE);  Surgeon: Gaye Pollack, MD;  Location: [facility];  Service: Open Heart Surgery;  Laterality: N/A;  . TONSILLECTOMY      Current Medications: Current Meds  Medication Sig  . acetaminophen (TYLENOL) 500 MG tablet Take 500-1,000 mg by mouth every 6 (six) hours as needed for headache (pain).  . Acetylcysteine (NAC PO) Take 3 tablets by mouth daily.   [name] albuterol (VENTOLIN HFA) 108 (90 Base) MCG/ACT inhaler Inhale 2 puffs into the lungs every 6 (six) hours as needed for wheezing or shortness of breath.  [name] amoxicillin (AMOXIL) 500 MG capsule Take 2000 mg 1 hour beofre dental procedures  .  amphetamine-dextroamphetamine (ADDERALL) 20 MG tablet Take 10-20 mg by mouth daily as needed (ADD and/or hypersomnolence).   [name] aspirin EC 325 MG EC tablet Take 1 tablet (325 mg total) by mouth daily.  [name] buPROPion (WELLBUTRIN XL) 300 MG 24 hr tablet Take 300 mg by mouth every morning.  . carvedilol (COREG) 3.125 MG tablet Take 1 tablet (3.125 mg total) by mouth 2 (two) times daily with a meal.  . furosemide (LASIX) 20 MG tablet Take 20 mg by mouth daily.  [name] gabapentin (NEURONTIN) 600 MG tablet Take 300 mg by mouth at bedtime as needed for pain.  [name] LORazepam (ATIVAN) 1 MG tablet Take 1 mg by mouth at bedtime as needed for anxiety or sleep.   [name] ondansetron (ZOFRAN) 8 MG tablet Take 4-8 mg by mouth every 8 (eight) hours as needed (nausea while traveling).  [name] oxymetazoline (AFRIN) 0.05 % nasal spray Place 1 spray into both nostrils daily as needed (allergies.).   [name] pramipexole (MIRAPEX) 1.5 MG tablet Take 0.375-1.5 tablets by mouth at bedtime as needed (restless legs).   [name] VITAMIN A PO Take 1 capsule by mouth 3 (three)  times a week.   . Vitamin D, Ergocalciferol, (DRISDOL) 1.25 MG (50000 UNIT) CAPS capsule Take 50,000 Units by mouth every 7 (seven) days.     Allergies:   Lisinopril, Iodine, and Metoprolol   Social History   Socioeconomic History  . Marital status: Married    Spouse name: Not on file  . Number of children: Not on file  . Years of education: Not on file  . Highest education level: Not on file  Occupational History  . Not on file  Tobacco Use  . Smoking status: Never Smoker  . Smokeless tobacco: Never Used  Vaping Use  . Vaping Use: Never used  Substance and Sexual Activity  . Alcohol use: Yes    Comment: occasional social drinking (once per month)   . Drug use: Never  . Sexual activity: Yes    Partners: Male    Birth control/protection: None  Other Topics Concern  . Not on file  Social History Narrative  . Not on file   Social Determinants of Health   Financial  Resource Strain: Not on file  Food Insecurity: Not on file  Transportation Needs: Not on file  Physical Activity: Not on file  Stress: Not on file  Social Connections: Not on file     Family History: The patient's family history includes Heart Problems in her father and mother; Heart attack in her sister; Hypertension in her mother. ROS:   Please see the history of present illness.    All 14 point review of systems negative except as described per history of present illness  EKGs/Labs/Other Studies Reviewed:      Recent Labs: 06/30/2019: ALT 19 07/09/2019: TSH 1.395 07/11/2019: Magnesium 1.8 07/29/2019: Hemoglobin 10.4; Platelets 448 08/25/2019: BUN 19; Creatinine, Ser 0.85; NT-Pro BNP 775; Potassium 4.6; Sodium 139  Recent Lipid Panel    Component Value Date/Time   CHOL 242 (H) 11/01/2017 1030   TRIG 175 (H) 11/01/2017 1030   HDL 47 11/01/2017 1030   CHOLHDL 5.1 (H) 11/01/2017 1030   LDLCALC 160 (H) 11/01/2017 1030    Physical Exam:    VS:  BP 124/82 (BP Location: Right Arm, Patient Position: Sitting)   Pulse 81   Ht 5\' 4"  (1.626 m)   Wt 134 lb (60.8 kg)   LMP  (LMP Unknown)   SpO2 97%   BMI 23.00 kg/m     Wt Readings from Last 3 Encounters:  04/08/20 134 lb (60.8 kg)  10/02/19 143 lb (64.9 kg)  08/25/19 140 lb 3.2 oz (63.6 kg)     GEN:  Well nourished, well developed in no acute distress HEENT: Normal NECK: No JVD; No carotid bruits LYMPHATICS: No lymphadenopathy CARDIAC: RRR, systolic ejection murmur grade 1/6 to 2/6 best heard right upper portion of the sternum, no rubs, no gallops RESPIRATORY:  Clear to auscultation without rales, wheezing or rhonchi  ABDOMEN: Soft, non-tender, non-distended MUSCULOSKELETAL:  No edema; No deformity  SKIN: Warm and dry LOWER EXTREMITIES: no swelling NEUROLOGIC:  Alert and oriented x 3 PSYCHIATRIC:  Normal affect   ASSESSMENT:    1. S/P AVR (aortic valve replacement)   2. Paroxysmal atrial fibrillation (HCC)   3. HTN  (hypertension), benign   4. Atrial fibrillation with RVR ([place])   5. Hyperlipidemia, unspecified hyperlipidemia type    PLAN:    In order of problems listed above:  1. Status post arctic valve replacement valve functioning properly in the summer we will repeat another echocardiogram.  She is asymptomatic doing  well. 2. Paroxysmal atrial fibrillation denies having episodes.  She was taking some very small dose of amiodarone but stopped by herself denies having any palpitation today she wants me to stop her anticoagulation I did quickly calculated her CHA2DS2-VASc which is five, I recommended against stopping Eliquis. 3. Essential hypertension blood pressure well controlled continue present management. 4. Dyslipidemia she does not want to take any statin for it.  I called her primary care physician to get fasting lipid profile.  We did talk in length about the fact that the entire situation statin would be necessary and needed however she still does not want to take it. 5. Diabetes I did review her K PN which show me her hemoglobin A1c of 7.1 this is from last year we will call primary care physician to get more updated data.   Medication Adjustments/Labs and Tests Ordered: Current medicines are reviewed at length with the patient today.  Concerns regarding medicines are outlined above.  No orders of the defined types were placed in this encounter.  Medication changes: No orders of the defined types were placed in this encounter.   Signed, Park Liter, MD, [facility] 04/08/2020 9:52 AM    [place]

## 2020-05-12 ENCOUNTER — Other Ambulatory Visit: Payer: Self-pay | Admitting: Cardiology

## 2020-05-14 ENCOUNTER — Other Ambulatory Visit: Payer: Self-pay | Admitting: Cardiology

## 2020-05-29 ENCOUNTER — Other Ambulatory Visit: Payer: Self-pay | Admitting: Cardiology

## 2020-08-17 LAB — HM MAMMOGRAPHY

## 2020-09-14 ENCOUNTER — Other Ambulatory Visit: Payer: Self-pay

## 2020-09-15 ENCOUNTER — Other Ambulatory Visit: Payer: Self-pay

## 2020-09-15 ENCOUNTER — Ambulatory Visit (INDEPENDENT_AMBULATORY_CARE_PROVIDER_SITE_OTHER): Payer: Managed Care, Other (non HMO) | Admitting: Cardiology

## 2020-09-15 VITALS — BP 124/68 | HR 92 | Ht 62.0 in | Wt 131.2 lb

## 2020-09-15 DIAGNOSIS — Z953 Presence of xenogenic heart valve: Secondary | ICD-10-CM

## 2020-09-15 DIAGNOSIS — E785 Hyperlipidemia, unspecified: Secondary | ICD-10-CM

## 2020-09-15 DIAGNOSIS — I4891 Unspecified atrial fibrillation: Secondary | ICD-10-CM

## 2020-09-15 DIAGNOSIS — I340 Nonrheumatic mitral (valve) insufficiency: Secondary | ICD-10-CM | POA: Diagnosis not present

## 2020-09-15 DIAGNOSIS — I350 Nonrheumatic aortic (valve) stenosis: Secondary | ICD-10-CM | POA: Diagnosis not present

## 2020-09-15 DIAGNOSIS — I428 Other cardiomyopathies: Secondary | ICD-10-CM

## 2020-09-15 DIAGNOSIS — I10 Essential (primary) hypertension: Secondary | ICD-10-CM

## 2020-09-15 DIAGNOSIS — E782 Mixed hyperlipidemia: Secondary | ICD-10-CM

## 2020-09-15 DIAGNOSIS — Z952 Presence of prosthetic heart valve: Secondary | ICD-10-CM

## 2020-09-15 NOTE — Patient Instructions (Signed)
Medication Instructions:  No medication changes. *If you need a refill on your cardiac medications before your next appointment, please call your pharmacy*   Lab Work: None ordered If you have labs (blood work) drawn today and your tests are completely normal, you will receive your results only by: [place] (if you have MyChart) OR A paper copy in the mail If you have any lab test that is abnormal or we need to change your treatment, we will call you to review the results.   Testing/Procedures: Your physician has requested that you have an echocardiogram. Echocardiography is a painless test that uses sound waves to create images of your heart. It provides your doctor with information about the size and shape of your heart and how well your heart's chambers and valves are working. This procedure takes approximately one hour. There are no restrictions for this procedure.    Follow-Up: At [facility], you and your health needs are our priority.  As part of our continuing mission to provide you with exceptional heart care, we have created designated Provider Care Teams.  These Care Teams include your primary Cardiologist (physician) and Advanced Practice Providers (APPs -  Physician Assistants and Nurse Practitioners) who all work together to provide you with the care you need, when you need it.  We recommend signing up for the patient portal called "MyChart".  Sign up information is provided on this After Visit Summary.  MyChart is used to connect with patients for Virtual Visits (Telemedicine).  Patients are able to view lab/test results, encounter notes, upcoming appointments, etc.  Non-urgent messages can be sent to your provider as well.   To learn more about what you can do with MyChart, go to [email or web address].    Your next appointment:   6 month(s)  The format for your next appointment:   In Person  Provider:   Jenne Campus, MD   Other  Instructions Echocardiogram An echocardiogram is a test that uses sound waves (ultrasound) to produce images of the heart. Images from an echocardiogram can provide important information about: Heart size and shape. The size and thickness and movement of your heart's walls. Heart muscle function and strength. Heart valve function or if you have stenosis. Stenosis is when the heart valves are too narrow. If blood is flowing backward through the heart valves (regurgitation). A tumor or infectious growth around the heart valves. Areas of heart muscle that are not working well because of poor blood flow or injury from a heart attack. Aneurysm detection. An aneurysm is a weak or damaged part of an artery wall. The wall bulges out from the normal force of blood pumping through the body. Tell a health care provider about: Any allergies you have. All medicines you are taking, including vitamins, herbs, eye drops, creams, and over-the-counter medicines. Any blood disorders you have. Any surgeries you have had. Any medical conditions you have. Whether you are pregnant or may be pregnant. What are the risks? Generally, this is a safe test. However, problems may occur, including an allergic reaction to dye (contrast) that may be used during the test. What happens before the test? No specific preparation is needed. You may eat and drink normally. What happens during the test? You will take off your clothes from the waist up and put on a hospital gown. Electrodes or electrocardiogram (ECG)patches may be placed on your chest. The electrodes or patches are then connected to a device that monitors your heart rate and rhythm. You will  lie down on a table for an ultrasound exam. A gel will be applied to your chest to help sound waves pass through your skin. A handheld device, called a transducer, will be pressed against your chest and moved over your heart. The transducer produces sound waves that travel to  your heart and bounce back (or "echo" back) to the transducer. These sound waves will be captured in real-time and changed into images of your heart that can be viewed on a video monitor. The images will be recorded on a computer and reviewed by your health care provider. You may be asked to change positions or hold your breath for a short time. This makes it easier to get different views or better views of your heart. In some cases, you may receive contrast through an IV in one of your veins. This can improve the quality of the pictures from your heart. The procedure may vary among health care providers and hospitals.   What can I expect after the test? You may return to your normal, everyday life, including diet, activities, and medicines, unless your health care provider tells you not to do that. Follow these instructions at home: It is up to you to get the results of your test. Ask your health care provider, or the department that is doing the test, when your results will be ready. Keep all follow-up visits. This is important. Summary An echocardiogram is a test that uses sound waves (ultrasound) to produce images of the heart. Images from an echocardiogram can provide important information about the size and shape of your heart, heart muscle function, heart valve function, and other possible heart problems. You do not need to do anything to prepare before this test. You may eat and drink normally. After the echocardiogram is completed, you may return to your normal, everyday life, unless your health care provider tells you not to do that. This information is not intended to replace advice given to you by your health care provider. Make sure you discuss any questions you have with your health care provider. Document Revised: 10/20/2019 Document Reviewed: 10/20/2019 Elsevier Patient Education  2021 [employer]   

## 2020-09-15 NOTE — Progress Notes (Signed)
Cardiology Office Note:    Date:  09/15/2020   ID:  Lindsay Guerrero, DOB 1955-03-16, MRN [id number]  PCP:  Bonnita Nasuti, MD  Cardiologist:  Jenne Campus, MD    Referring MD: Bonnita Nasuti, MD   Chief Complaint  Patient presents with   Follow-up    Patient stop taking Eliquis    History of Present Illness:    Lindsay Guerrero is a 65 y.o. female with complex past medical history.  About a year ago she did have aortic valve replacement secondary to arctic valve stenosis the prosthetic valve is bioprosthesis Edwards 21 mm valve, hospital course was complicated by episode of atrial fibrillation suppressed with amiodarone.  She also has dyslipidemia however is reluctant to take any statin.  She comes today 2 months of follow-up overall seems to be doing well.  Denies any chest pain tightness squeezing pressure burning chest overall doing well and very happy and satisfied with surgery that she had   Past Medical History:  Diagnosis Date   Adult attention deficit disorder 05/15/2013   Anxiety state 11/19/2012   ICD10   Aortic stenosis 12/13/2017   Moderate by echocardiogram from 2019 mean gradient 25   Aortic stenosis, mild 07/13/2016   Moderate by echocardiogram from 2019 mean gradient 25   Aortic stenosis, severe 07/02/2019   Aortic valve sclerosis 05/24/2015   Formatting of this note might be different from the original. Without stenosis. Last Echo 2012   Asthma    mild   Atrial fibrillation ([place]) 06/28/2018   Atrial fibrillation with RVR ([place]) 06/28/2018   CHF (congestive heart failure) (HCC)    Chronic bilateral low back pain 01/27/2015   Depression    Diabetes mellitus without complication (HCC)    borderline   Dyspnea    on exertion   Dyspnea on exertion 11/01/2017   Elective surgery for purposes other than treating health conditions 09/27/2010   ICD10   Essential hypertension 06/30/2018   Family history of colon cancer 03/01/2005   Family history of premature coronary artery  disease 03/01/2005   GAD (generalized anxiety disorder) 03/01/2005   Formatting of this note might be different from the original. with panic attacks   Hepatitis    due to cytomegalovirus-resolved   History of breast cancer 06/30/2018   History of hiatal hernia    mild,found on a CT   History of malignant neoplasm of breast 03/01/2005   Formatting of this note might be different from the original. node neg; see chartnotes for treatment   HTN (hypertension), benign 07/13/2016   Hyperlipidemia 06/30/2018   Hypokalemia 06/30/2018   Inflamed seborrheic keratosis 08/08/2012   Irritable bowel syndrome 03/01/2005   Malignant neoplasm of upper-inner quadrant of female breast ([place]) 11/01/2017   Primary  diagnosis   Mild intermittent asthma without complication 40/98/1191   Formatting of this note might be different from the original. mild   Mitral regurgitation 12/13/2017   Mild to moderate based on echo from 2019   Mixed hyperlipidemia 07/20/2019   NICM (nonischemic cardiomyopathy) ([place]) 11/01/2015   Osteopenia 01/27/2015   Paroxysmal atrial fibrillation ([place]) 07/14/2019   Pneumonia 2018   Post menopausal problems 01/27/2015   Precordial pain 11/01/2015   Psoriatic arthritis ([place]) 04/25/2015   Recurrent genital HSV (herpes simplex virus) infection 03/01/2005   Formatting of this note might be different from the original. HSV genital   Restless legs syndrome 03/01/2005   Rosacea 03/01/2005   Routine history and physical examination of  adult 09/22/2012   Formatting of this note might be different from the original. PROBLEM LIST:  1. Left node negative breast cancer 2002, stage I, grade 1, status post  lumpectomy and node dissection, external beam radiation,  chemotherapy/Cytoxan, mild left arm lymphedema with cellulitis 2004.  2. Anxiety disorder with panic attacks, family history of same, globus  pharyngeus, palpitations, atypical chest pain, str   S/P aortic valve replacement with bioprosthetic valve 21  mm Edwards INSPIRIS RESILIA  07/02/2019   Size 21 mm   S/P AVR (aortic valve replacement) 07/20/2019   Seborrheic keratoses 09/10/2016   Sleep apnea    borderline   Vaginal dryness 01/27/2015    Past Surgical History:  Procedure Laterality Date   AORTIC VALVE REPLACEMENT N/A 07/02/2019   Procedure: AORTIC VALVE REPLACEMENT (AVR) USING INSPIRIS VALVE SIZE 21MM;  Surgeon: Gaye Pollack, MD;  Location: [place];  Service: Open Heart Surgery;  Laterality: N/A;   BREAST LUMPECTOMY Left 2003   sentinal node bx.   CARDIOVERSION N/A 07/10/2019   Procedure: CARDIOVERSION;  Surgeon: Donato Heinz, MD;  [facility] ENDOSCOPY;  Service: Cardiovascular;  Laterality: N/A;   CESAREAN SECTION     CLIPPING OF ATRIAL APPENDAGE N/A 07/02/2019   Procedure: CLIPPING OF ATRIAL APPENDAGE USING PRO2 CLIP SIZE 40MM;  Surgeon: Gaye Pollack, MD;  Location: [place];  Service: Open Heart Surgery;  Laterality: N/A;   COSMETIC SURGERY     IR THORACENTESIS ASP PLEURAL SPACE W/IMG GUIDE  07/07/2019   RIGHT/LEFT HEART CATH AND CORONARY ANGIOGRAPHY N/A 05/20/2019   Procedure: RIGHT/LEFT HEART CATH AND CORONARY ANGIOGRAPHY;  Surgeon: [name], MD;  Location: [place] CV LAB;  Service: Cardiovascular;  Laterality: N/A;   TEE WITHOUT CARDIOVERSION N/A 07/02/2019   Procedure: TRANSESOPHAGEAL ECHOCARDIOGRAM (TEE);  Surgeon: Gaye Pollack, MD;  Location: [facility];  Service: Open Heart Surgery;  Laterality: N/A;   TONSILLECTOMY      Current Medications: Current Meds  Medication Sig   acetaminophen (TYLENOL) 500 MG tablet Take 500-1,000 mg by mouth every 6 (six) hours as needed for headache (pain).   Acetylcysteine (NAC PO) Take 3 tablets by mouth daily. Unknown strength   amoxicillin (AMOXIL) 500 MG capsule Take 2000 mg 1 hour beofre dental procedures (Patient taking differently: Take 2,000 mg by mouth See admin instructions. Take 2000 mg 1 hour beofre dental procedures)   amphetamine-dextroamphetamine (ADDERALL)  20 MG tablet Take 10-20 mg by mouth daily as needed (ADD and/or hypersomnolence).    carvedilol (COREG) 3.125 MG tablet Take 1 tablet (3.125 mg total) by mouth 2 (two) times daily with a meal.   furosemide (LASIX) 20 MG tablet Take 20 mg by mouth daily.   LORazepam (ATIVAN) 1 MG tablet Take 1 mg by mouth at bedtime as needed for anxiety or sleep.    ondansetron (ZOFRAN) 8 MG tablet Take 4-8 mg by mouth every 8 (eight) hours as needed for nausea or vomiting (nausea while traveling).   oxymetazoline (AFRIN) 0.05 % nasal spray Place 1 spray into both nostrils daily as needed (allergies.).    VITAMIN A PO Take 1 capsule by mouth 3 (three) times a week. Unknown strenght   Vitamin D, Ergocalciferol, (DRISDOL) 1.25 MG (50000 UNIT) CAPS capsule Take 50,000 Units by mouth every 7 (seven) days.     Allergies:   Lisinopril, Iodine, and Metoprolol   Social History   Socioeconomic History   Marital status: Married    Spouse name: Not on file  Number of children: Not on file   Years of education: Not on file   Highest education level: Not on file  Occupational History   Not on file  Tobacco Use   Smoking status: Never   Smokeless tobacco: Never  Vaping Use   Vaping Use: Never used  Substance and Sexual Activity   Alcohol use: Yes    Comment: occasional social drinking (once per month)    Drug use: Never   Sexual activity: Yes    Partners: Male    Birth control/protection: None  Other Topics Concern   Not on file  Social History Narrative   Not on file   Social Determinants of Health   Financial Resource Strain: Not on file  Food Insecurity: Not on file  Transportation Needs: Not on file  Physical Activity: Not on file  Stress: Not on file  Social Connections: Not on file     Family History: The patient's family history includes Heart Problems in her father and mother; Heart attack in her sister; Hypertension in her mother. ROS:   Please see the history of present illness.     All 14 point review of systems negative except as described per history of present illness  EKGs/Labs/Other Studies Reviewed:      Recent Labs: No results found for requested labs within last 8760 hours.  Recent Lipid Panel    Component Value Date/Time   CHOL 242 (H) 11/01/2017 1030   TRIG 175 (H) 11/01/2017 1030   HDL 47 11/01/2017 1030   CHOLHDL 5.1 (H) 11/01/2017 1030   LDLCALC 160 (H) 11/01/2017 1030    Physical Exam:    VS:  BP 124/68 (BP Location: Left Arm, Patient Position: Sitting)   Pulse 92   Ht 5\' 2"  (1.575 m)   Wt 131 lb 3.2 oz (59.5 kg)   LMP  (LMP Unknown)   SpO2 97%   BMI 24.00 kg/m     Wt Readings from Last 3 Encounters:  09/15/20 131 lb 3.2 oz (59.5 kg)  04/08/20 134 lb (60.8 kg)  10/02/19 143 lb (64.9 kg)     GEN:  Well nourished, well developed in no acute distress HEENT: Normal NECK: No JVD; No carotid bruits LYMPHATICS: No lymphadenopathy CARDIAC: RRR, systolic ejection murmur grade 1/6 no rubs, no gallops RESPIRATORY:  Clear to auscultation without rales, wheezing or rhonchi  ABDOMEN: Soft, non-tender, non-distended MUSCULOSKELETAL:  No edema; No deformity  SKIN: Warm and dry LOWER EXTREMITIES: no swelling NEUROLOGIC:  Alert and oriented x 3 PSYCHIATRIC:  Normal affect   ASSESSMENT:    1. Nonrheumatic mitral valve regurgitation   2. Aortic stenosis, severe   3. NICM (nonischemic cardiomyopathy) ([place])   4. S/P aortic valve replacement with bioprosthetic valve 21 mm Edwards INSPIRIS RESILIA    5. Mixed hyperlipidemia   6. S/P AVR (aortic valve replacement)   7. Hyperlipidemia, unspecified hyperlipidemia type   8. Atrial fibrillation with RVR ([place])   9. Essential hypertension    PLAN:    In order of problems listed above:  Nonrheumatic mitral valve regurgitation, only mild on last echocardiogram echocardiogram will be repeated for aortic valve replacement.  We will reassess this lesion. Status post aortic valve replacement this is  started Edwards bioprosthetic valve 21 mm.  Echocardiogram will be done to assess the lesion likely physical exam signs unchanged. Paroxysmal atrial fibrillation she had episode of atrial fibrillation while after surgery.  She was anticoagulated but stopped herself she is does not want to  take it because of bruising.  Truly she got only 1 episode after open heart surgery.  She also got appendage clipped, therefore her risk relatively low.  And again she does not want to take this medication anymore. Dyslipidemia she agreed to have fasting lipid profile done after she told me already that she would be reluctant to consider any statin.   Medication Adjustments/Labs and Tests Ordered: Current medicines are reviewed at length with the patient today.  Concerns regarding medicines are outlined above.  Orders Placed This Encounter  Procedures   Direct LDL   EKG 12-Lead   ECHOCARDIOGRAM COMPLETE   Medication changes: No orders of the defined types were placed in this encounter.   Signed, Park Liter, MD, [facility] 09/15/2020 12:13 PM    [place]

## 2020-09-16 ENCOUNTER — Telehealth: Payer: Self-pay

## 2020-09-16 LAB — LDL CHOLESTEROL, DIRECT: LDL Direct: 166 mg/dL — ABNORMAL HIGH (ref 0–99)

## 2020-09-16 NOTE — Telephone Encounter (Signed)
Spoke with patient regarding results and recommendation.  Patient verbalizes understanding and is agreeable to plan of care. Advised patient to call back with any issues or concerns.    She does not want to take anything for it at this time. She states she will call back if she changes her mind.    Encouraged patient to call back with any questions or concerns.

## 2020-09-16 NOTE — Telephone Encounter (Signed)
-----   Message from Park Liter, MD sent at 09/16/2020 12:39 PM EDT ----- LDL is significantly elevated 166.  I note that she is very reluctant to take any medications 70 talk to her lets see if she would be willing to try something.  She is a Marine scientist

## 2020-10-03 ENCOUNTER — Other Ambulatory Visit: Payer: Self-pay

## 2020-10-03 ENCOUNTER — Ambulatory Visit (INDEPENDENT_AMBULATORY_CARE_PROVIDER_SITE_OTHER): Payer: Managed Care, Other (non HMO)

## 2020-10-03 DIAGNOSIS — Z953 Presence of xenogenic heart valve: Secondary | ICD-10-CM | POA: Diagnosis not present

## 2020-10-03 DIAGNOSIS — I350 Nonrheumatic aortic (valve) stenosis: Secondary | ICD-10-CM | POA: Diagnosis not present

## 2020-10-03 DIAGNOSIS — I340 Nonrheumatic mitral (valve) insufficiency: Secondary | ICD-10-CM | POA: Diagnosis not present

## 2020-10-03 NOTE — Progress Notes (Signed)
Complete echocardiogram performed.  Jimmy Rennee Coyne RDCS, RVT  

## 2020-10-04 ENCOUNTER — Ambulatory Visit (INDEPENDENT_AMBULATORY_CARE_PROVIDER_SITE_OTHER): Payer: Managed Care, Other (non HMO) | Admitting: Cardiology

## 2020-10-04 ENCOUNTER — Encounter: Payer: Self-pay | Admitting: Cardiology

## 2020-10-04 DIAGNOSIS — A421 Abdominal actinomycosis: Secondary | ICD-10-CM | POA: Insufficient documentation

## 2020-10-04 DIAGNOSIS — E785 Hyperlipidemia, unspecified: Secondary | ICD-10-CM

## 2020-10-04 DIAGNOSIS — I4891 Unspecified atrial fibrillation: Secondary | ICD-10-CM

## 2020-10-04 DIAGNOSIS — J329 Chronic sinusitis, unspecified: Secondary | ICD-10-CM | POA: Insufficient documentation

## 2020-10-04 DIAGNOSIS — G9009 Other idiopathic peripheral autonomic neuropathy: Secondary | ICD-10-CM | POA: Insufficient documentation

## 2020-10-04 DIAGNOSIS — W57XXXA Bitten or stung by nonvenomous insect and other nonvenomous arthropods, initial encounter: Secondary | ICD-10-CM | POA: Insufficient documentation

## 2020-10-04 DIAGNOSIS — I480 Paroxysmal atrial fibrillation: Secondary | ICD-10-CM

## 2020-10-04 DIAGNOSIS — G459 Transient cerebral ischemic attack, unspecified: Secondary | ICD-10-CM | POA: Insufficient documentation

## 2020-10-04 DIAGNOSIS — Z79899 Other long term (current) drug therapy: Secondary | ICD-10-CM | POA: Diagnosis not present

## 2020-10-04 DIAGNOSIS — E039 Hypothyroidism, unspecified: Secondary | ICD-10-CM | POA: Insufficient documentation

## 2020-10-04 DIAGNOSIS — I10 Essential (primary) hypertension: Secondary | ICD-10-CM

## 2020-10-04 DIAGNOSIS — E782 Mixed hyperlipidemia: Secondary | ICD-10-CM | POA: Diagnosis not present

## 2020-10-04 DIAGNOSIS — R41840 Attention and concentration deficit: Secondary | ICD-10-CM | POA: Insufficient documentation

## 2020-10-04 DIAGNOSIS — E559 Vitamin D deficiency, unspecified: Secondary | ICD-10-CM | POA: Insufficient documentation

## 2020-10-04 DIAGNOSIS — M5432 Sciatica, left side: Secondary | ICD-10-CM | POA: Insufficient documentation

## 2020-10-04 DIAGNOSIS — Z953 Presence of xenogenic heart valve: Secondary | ICD-10-CM

## 2020-10-04 DIAGNOSIS — L299 Pruritus, unspecified: Secondary | ICD-10-CM | POA: Insufficient documentation

## 2020-10-04 DIAGNOSIS — L4052 Psoriatic arthritis mutilans: Secondary | ICD-10-CM | POA: Insufficient documentation

## 2020-10-04 DIAGNOSIS — D473 Essential (hemorrhagic) thrombocythemia: Secondary | ICD-10-CM | POA: Insufficient documentation

## 2020-10-04 DIAGNOSIS — D518 Other vitamin B12 deficiency anemias: Secondary | ICD-10-CM | POA: Insufficient documentation

## 2020-10-04 DIAGNOSIS — B029 Zoster without complications: Secondary | ICD-10-CM | POA: Insufficient documentation

## 2020-10-04 DIAGNOSIS — M62830 Muscle spasm of back: Secondary | ICD-10-CM | POA: Insufficient documentation

## 2020-10-04 LAB — ECHOCARDIOGRAM COMPLETE
AR max vel: 1.18 cm2
AV Area VTI: 1.21 cm2
AV Area mean vel: 1.24 cm2
AV Mean grad: 9 mmHg
AV Peak grad: 15.5 mmHg
Ao pk vel: 1.97 m/s
Area-P 1/2: 4.54 cm2
Calc EF: 43.4 %
S' Lateral: 2.9 cm
Single Plane A2C EF: 41.7 %
Single Plane A4C EF: 45.6 %

## 2020-10-04 MED ORDER — PRAVASTATIN SODIUM 40 MG PO TABS: 40.0000 mg | ORAL_TABLET | Freq: Every evening | ORAL | 3 refills | Status: DC

## 2020-10-04 NOTE — Patient Instructions (Signed)
Medication Instructions:  Your physician has recommended you make the following change in your medication:  START: Pravastatin 40 mg once daily *If you need a refill on your cardiac medications before your next appointment, please call your pharmacy*   Lab Work: Your physician recommends that you return for lab work in: In 6 weeks:  AST, ALT, Lipids - please come fasting If you have labs (blood work) drawn today and your tests are completely normal, you will receive your results only by: [place] (if you have MyChart) OR A paper copy in the mail If you have any lab test that is abnormal or we need to change your treatment, we will call you to review the results.   Testing/Procedures: None   Follow-Up: At [facility], you and your health needs are our priority.  As part of our continuing mission to provide you with exceptional heart care, we have created designated Provider Care Teams.  These Care Teams include your primary Cardiologist (physician) and Advanced Practice Providers (APPs -  Physician Assistants and Nurse Practitioners) who all work together to provide you with the care you need, when you need it.  We recommend signing up for the patient portal called "MyChart".  Sign up information is provided on this After Visit Summary.  MyChart is used to connect with patients for Virtual Visits (Telemedicine).  Patients are able to view lab/test results, encounter notes, upcoming appointments, etc.  Non-urgent messages can be sent to your provider as well.   To learn more about what you can do with MyChart, go to [email or web address].    Your next appointment:   4 month(s)  The format for your next appointment:   In Person  Provider:   Jenne Campus, MD   Other Instructions

## 2020-10-05 ENCOUNTER — Other Ambulatory Visit: Payer: Self-pay | Admitting: Cardiology

## 2020-10-06 NOTE — Progress Notes (Signed)
Cardiology Office Note:    Date:  10/06/2020   ID:  PRESTINA JANN, DOB 08-23-1955, MRN [id number]  PCP:  Zoila Shutter, NP  Cardiologist:  Jenne Campus, MD    Referring MD: Zoila Shutter, NP   No chief complaint on file.   History of Present Illness:    Lindsay Guerrero is a 65 y.o. female with past medical history significant for aortic stenosis, status post aortic valve replacement with a bioprosthesis 21 mm Edwards, paroxysmal atrial fibrillation, essential hypertension, dyslipidemia.  She comes today to my office after being in the emergency room because of episode of atrial fibrillation.  She was given Cardizem and she broke to sinus rhythm.  Overall since that time she is doing well.  She said this is the first episode since her open heart surgery with recurrent episode of atrial fibrillation.  I told her because of her risk she needs to be taking anticoagulation however she does not want to do that.  She is a Marine scientist and she perfectly understand consequences of not taking anticoagulation still does not want to do it the fact is that she feels when she does have atrial fibrillation.  So I will give her prescription for Eliquis and ask her to start taking that immediately after she started feeling her atrial fibrillation coming back.  She also have of dosages of Cardizem '30mg'$  taking as needed every 6 hours for palpitation ask her to do it when she still having it otherwise she is doing very well.  There is no chest pain tightness squeezing pressure burning chest  Past Medical History:  Diagnosis Date   Adult attention deficit disorder 05/15/2013   Anxiety state 11/19/2012   ICD10   Aortic stenosis 12/13/2017   Moderate by echocardiogram from 2019 mean gradient 25   Aortic stenosis, mild 07/13/2016   Moderate by echocardiogram from 2019 mean gradient 25   Aortic stenosis, severe 07/02/2019   Aortic valve sclerosis 05/24/2015   Formatting of this note might be different from the  original. Without stenosis. Last Echo 2012   Asthma    mild   Atrial fibrillation ([place]) 06/28/2018   Atrial fibrillation with RVR ([place]) 06/28/2018   CHF (congestive heart failure) (HCC)    Chronic bilateral low back pain 01/27/2015   Depression    Diabetes mellitus without complication (HCC)    borderline   Dyspnea    on exertion   Dyspnea on exertion 11/01/2017   Elective surgery for purposes other than treating health conditions 09/27/2010   ICD10   Essential hypertension 06/30/2018   Family history of colon cancer 03/01/2005   Family history of premature coronary artery disease 03/01/2005   GAD (generalized anxiety disorder) 03/01/2005   Formatting of this note might be different from the original. with panic attacks   Hepatitis    due to cytomegalovirus-resolved   History of breast cancer 06/30/2018   History of hiatal hernia    mild,found on a CT   History of malignant neoplasm of breast 03/01/2005   Formatting of this note might be different from the original. node neg; see chartnotes for treatment   HTN (hypertension), benign 07/13/2016   Hyperlipidemia 06/30/2018   Hypokalemia 06/30/2018   Inflamed seborrheic keratosis 08/08/2012   Irritable bowel syndrome 03/01/2005   Malignant neoplasm of upper-inner quadrant of female breast ([place]) 11/01/2017   Primary  diagnosis   Mild intermittent asthma without complication [id number]   Formatting of this note might be different from  the original. mild   Mitral regurgitation 12/13/2017   Mild to moderate based on echo from 2019   Mixed hyperlipidemia 07/20/2019   NICM (nonischemic cardiomyopathy) ([place]) 11/01/2015   Osteopenia 01/27/2015   Paroxysmal atrial fibrillation ([place]) 07/14/2019   Pneumonia 2018   Post menopausal problems 01/27/2015   Precordial pain 11/01/2015   Psoriatic arthritis ([place]) 04/25/2015   Recurrent genital HSV (herpes simplex virus) infection 03/01/2005   Formatting of this note might be different from the original. HSV  genital   Restless legs syndrome 03/01/2005   Rosacea 03/01/2005   Routine history and physical examination of adult 09/22/2012   Formatting of this note might be different from the original. PROBLEM LIST:  1. Left node negative breast cancer 2002, stage I, grade 1, status post  lumpectomy and node dissection, external beam radiation,  chemotherapy/Cytoxan, mild left arm lymphedema with cellulitis 2004.  2. Anxiety disorder with panic attacks, family history of same, globus  pharyngeus, palpitations, atypical chest pain, str   S/P aortic valve replacement with bioprosthetic valve 21 mm Edwards INSPIRIS RESILIA  07/02/2019   Size 21 mm   S/P AVR (aortic valve replacement) 07/20/2019   Seborrheic keratoses 09/10/2016   Sleep apnea    borderline   Vaginal dryness 01/27/2015    Past Surgical History:  Procedure Laterality Date   AORTIC VALVE REPLACEMENT N/A 07/02/2019   Procedure: AORTIC VALVE REPLACEMENT (AVR) USING INSPIRIS VALVE SIZE 21MM;  Surgeon: Gaye Pollack, MD;  Location: [facility];  Service: Open Heart Surgery;  Laterality: N/A;   BREAST LUMPECTOMY Left 2003   sentinal node bx.   CARDIOVERSION N/A 07/10/2019   Procedure: CARDIOVERSION;  Surgeon: Donato Heinz, MD;  Location: [facility] ENDOSCOPY;  Service: Cardiovascular;  Laterality: N/A;   CESAREAN SECTION     CLIPPING OF ATRIAL APPENDAGE N/A 07/02/2019   Procedure: CLIPPING OF ATRIAL APPENDAGE USING PRO2 CLIP SIZE 40MM;  Surgeon: Gaye Pollack, MD;  Location: [facility];  Service: Open Heart Surgery;  Laterality: N/A;   COSMETIC SURGERY     IR THORACENTESIS ASP PLEURAL SPACE W/IMG GUIDE  07/07/2019   RIGHT/LEFT HEART CATH AND CORONARY ANGIOGRAPHY N/A 05/20/2019   Procedure: RIGHT/LEFT HEART CATH AND CORONARY ANGIOGRAPHY;  Surgeon: [name], MD;  Location: [facility] CV [facility];  Service: Cardiovascular;  Laterality: N/A;   TEE WITHOUT CARDIOVERSION N/A 07/02/2019   Procedure: TRANSESOPHAGEAL ECHOCARDIOGRAM (TEE);  Surgeon: Gaye Pollack, MD;  Location: [place];  Service: Open Heart Surgery;  Laterality: N/A;   TONSILLECTOMY      Current Medications: Current Meds  Medication Sig   acetaminophen (TYLENOL) 500 MG tablet Take 500-1,000 mg by mouth every 6 (six) hours as needed for headache (pain).   Acetylcysteine (NAC PO) Take 3 tablets by mouth daily. Unknown strength   albuterol (VENTOLIN HFA) 108 (90 Base) MCG/ACT inhaler Inhale 1 puff into the lungs every 4 (four) hours as needed for shortness of breath or wheezing.   amphetamine-dextroamphetamine (ADDERALL) 20 MG tablet Take 10-20 mg by mouth daily as needed (ADD and/or hypersomnolence).    carvedilol (COREG) 3.125 MG tablet Take 1 tablet (3.125 mg total) by mouth 2 (two) times daily with a meal.   furosemide (LASIX) 20 MG tablet Take 20 mg by mouth daily.   LORazepam (ATIVAN) 1 MG tablet Take 1 mg by mouth at bedtime as needed for anxiety or sleep.    metFORMIN (GLUCOPHAGE-XR) 500 MG 24 hr tablet Take 1 tablet by mouth daily.   ondansetron (  ZOFRAN) 8 MG tablet Take 4-8 mg by mouth every 8 (eight) hours as needed for nausea or vomiting (nausea while traveling).   oxymetazoline (AFRIN) 0.05 % nasal spray Place 1 spray into both nostrils daily as needed for congestion (allergies.).   pravastatin (PRAVACHOL) 40 MG tablet Take 1 tablet (40 mg total) by mouth every evening.   VITAMIN A PO Take 1 capsule by mouth 3 (three) times a week. Unknown strenght   Vitamin D, Ergocalciferol, (DRISDOL) 1.25 MG (50000 UNIT) CAPS capsule Take 50,000 Units by mouth every 7 (seven) days.   [DISCONTINUED] amoxicillin (AMOXIL) 500 MG capsule Take 2000 mg 1 hour beofre dental procedures (Patient taking differently: Take 2,000 mg by mouth See admin instructions. Take 2000 mg 1 hour beofre dental procedures)   [DISCONTINUED] pravastatin (PRAVACHOL) 40 MG tablet Take 1 tablet (40 mg total) by mouth every evening.     Allergies:   Lisinopril, Iodine, and Metoprolol   Social History    Socioeconomic History   Marital status: Married    Spouse name: Not on file   Number of children: Not on file   Years of education: Not on file   Highest education level: Not on file  Occupational History   Not on file  Tobacco Use   Smoking status: Never   Smokeless tobacco: Never  Vaping Use   Vaping Use: Never used  Substance and Sexual Activity   Alcohol use: Yes    Comment: occasional social drinking (once per month)    Drug use: Never   Sexual activity: Yes    Partners: Male    Birth control/protection: None  Other Topics Concern   Not on file  Social History Narrative   Not on file   Social Determinants of Health   Financial Resource Strain: Not on file  Food Insecurity: Not on file  Transportation Needs: Not on file  Physical Activity: Not on file  Stress: Not on file  Social Connections: Not on file     Family History: The patient's family history includes Heart Problems in her father and mother; Heart attack in her sister; Hypertension in her mother. ROS:   Please see the history of present illness.    All 14 point review of systems negative except as described per history of present illness  EKGs/Labs/Other Studies Reviewed:      Recent Labs: No results found for requested labs within last 8760 hours.  Recent Lipid Panel    Component Value Date/Time   CHOL 242 (H) 11/01/2017 1030   TRIG 175 (H) 11/01/2017 1030   HDL 47 11/01/2017 1030   CHOLHDL 5.1 (H) 11/01/2017 1030   LDLCALC 160 (H) 11/01/2017 1030   LDLDIRECT 166 (H) 09/15/2020 1326    Physical Exam:    VS:  LMP  (LMP Unknown)     Wt Readings from Last 3 Encounters:  09/15/20 131 lb 3.2 oz (59.5 kg)  04/08/20 134 lb (60.8 kg)  10/02/19 143 lb (64.9 kg)     GEN:  Well nourished, well developed in no acute distress HEENT: Normal NECK: No JVD; No carotid bruits LYMPHATICS: No lymphadenopathy CARDIAC: RRR, no murmurs, no rubs, no gallops RESPIRATORY:  Clear to auscultation  without rales, wheezing or rhonchi  ABDOMEN: Soft, non-tender, non-distended MUSCULOSKELETAL:  No edema; No deformity  SKIN: Warm and dry LOWER EXTREMITIES: no swelling NEUROLOGIC:  Alert and oriented x 3 PSYCHIATRIC:  Normal affect   ASSESSMENT:    1. Atrial fibrillation with RVR ([place])  2. Mixed hyperlipidemia   3. Medication management   4. Paroxysmal atrial fibrillation (HCC)   5. S/P aortic valve replacement with bioprosthetic valve 21 mm Edwards INSPIRIS RESILIA    6. Hyperlipidemia, unspecified hyperlipidemia type   7. HTN (hypertension), benign    PLAN:    In order of problems listed above:  Paroxysmal atrial fibrillation she was recently in the emergency room I did review the visit from the emergency room.  Clearly she got atrial fibrillation fast ventricular rate converted quite easily spontaneously.  It is difficult to the fact that he does not want to take anticoagulation however she promised me to start taking at the moment she started feeling atrial fibrillation. Dyslipidemia continue present management. S/p valve replacement.  Echocardiogram reviewed normal valve function doing well Essential hypertension seems to be controlled   Medication Adjustments/Labs and Tests Ordered: Current medicines are reviewed at length with the patient today.  Concerns regarding medicines are outlined above.  Orders Placed This Encounter  Procedures   AST   ALT   Lipid panel   EKG 12-Lead   Medication changes:  Meds ordered this encounter  Medications   DISCONTD: pravastatin (PRAVACHOL) 40 MG tablet    Sig: Take 1 tablet (40 mg total) by mouth every evening.    Dispense:  90 tablet    Refill:  3   pravastatin (PRAVACHOL) 40 MG tablet    Sig: Take 1 tablet (40 mg total) by mouth every evening.    Dispense:  90 tablet    Refill:  3    Signed, Park Liter, MD, [facility] 10/06/2020 9:27 AM    [place]

## 2021-01-10 IMAGING — CT CT ANGIO CHEST
2 of 7 series · 16 of 36 positions shown · IV contrast (APPLIED)
Comparison: Chest CTA 10/30/2015.

CLINICAL DATA: 63-year-old female with history of severe aortic
stenosis. Preprocedural study prior to potential transcatheter
aortic valve replacement (TAVR) procedure.

EXAM:
CT ANGIOGRAPHY CHEST, ABDOMEN AND PELVIS
TECHNIQUE: Multidetector CT imaging through the chest, abdomen and pelvis was
performed using the standard protocol during bolus administration of
intravenous contrast. Multiplanar reconstructed images and MIPs were
obtained and reviewed to evaluate the vascular anatomy.
CONTRAST:  95 mL of Omnipaque 350.

[Series 5: ax thins · axial · 0.59mm/px · z∈[+543,+1112]mm · 15 of 641 slices shown]
[im 36/641  lung]
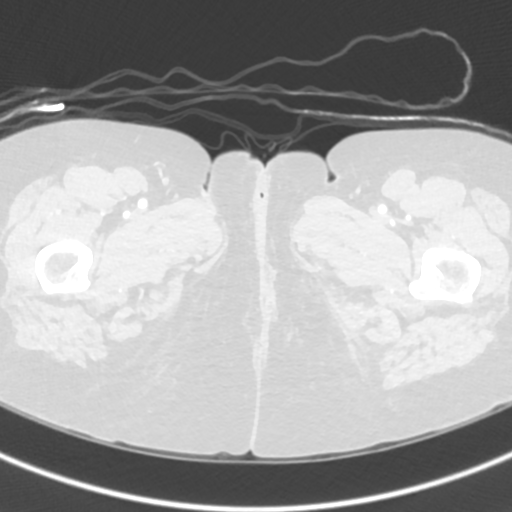
[im 72/641  mediastinal]
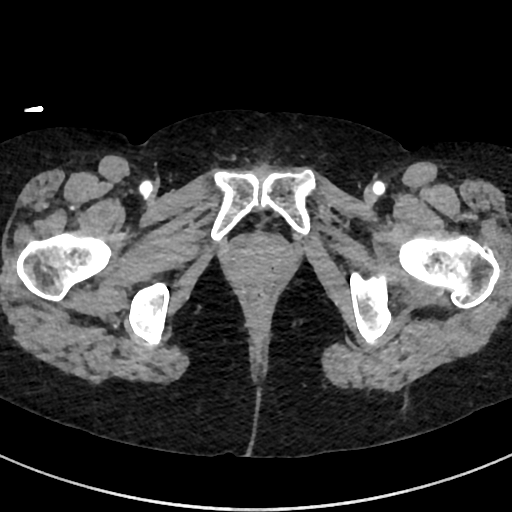
[im 107/641  lung]
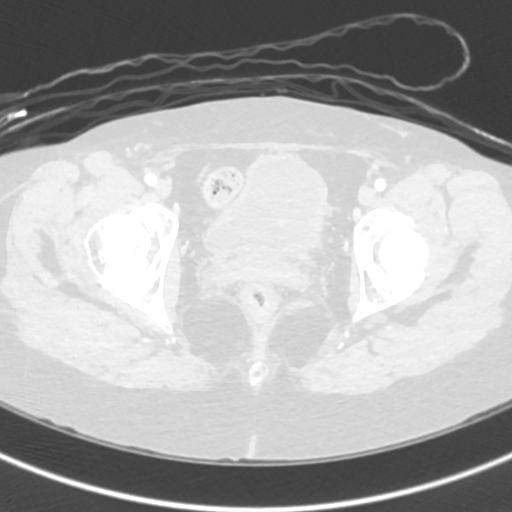
[im 143/641  mediastinal]
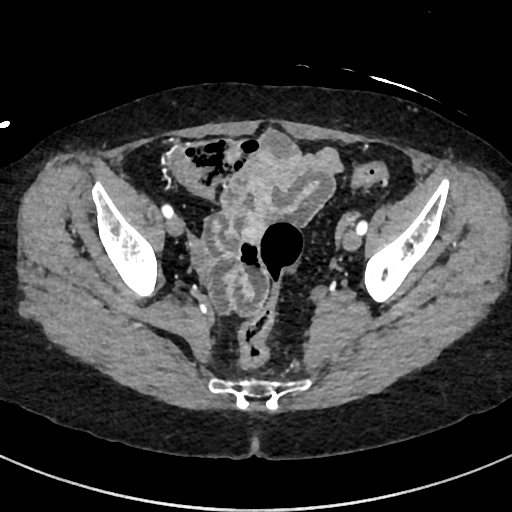
[im 214/641  lung]
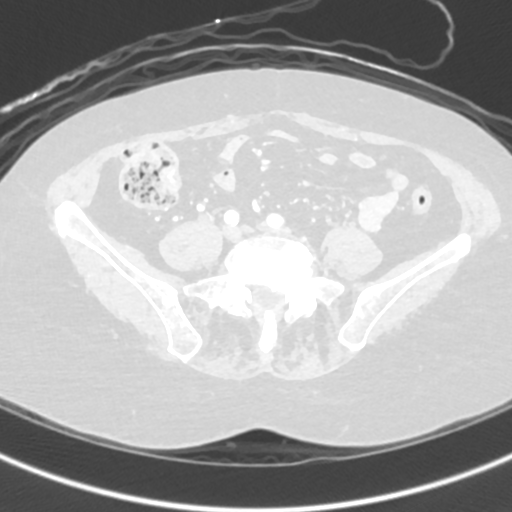
[im 249/641  mediastinal]
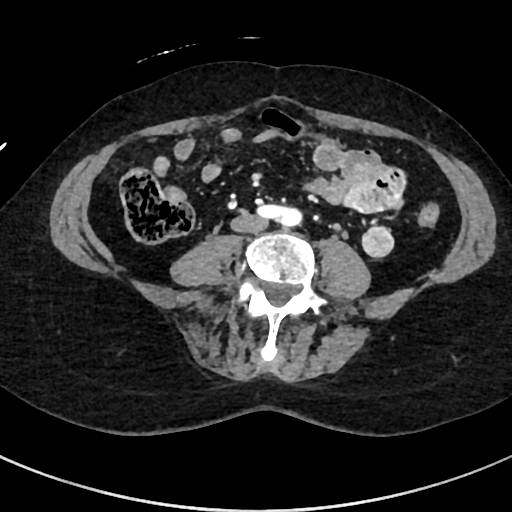
[im 285/641  lung]
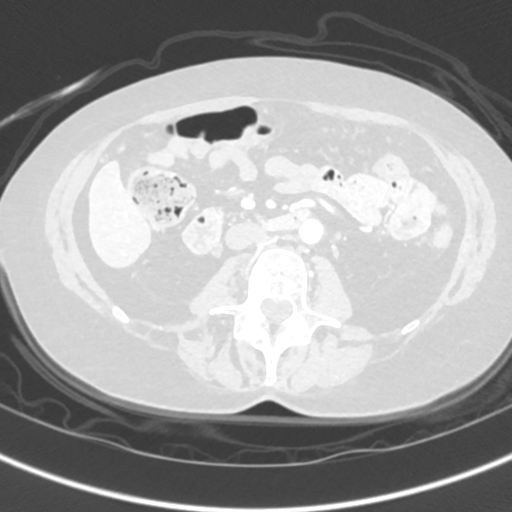
[im 321/641  mediastinal]
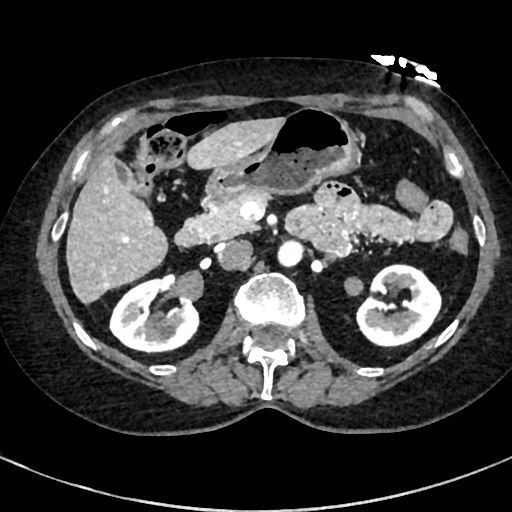
[im 356/641  lung]
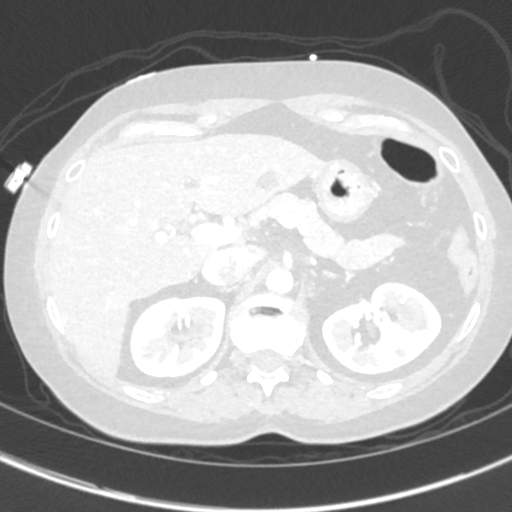
[im 392/641  mediastinal]
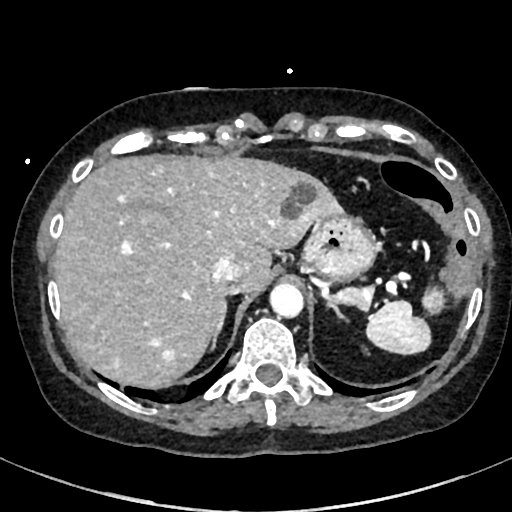
[im 427/641  lung]
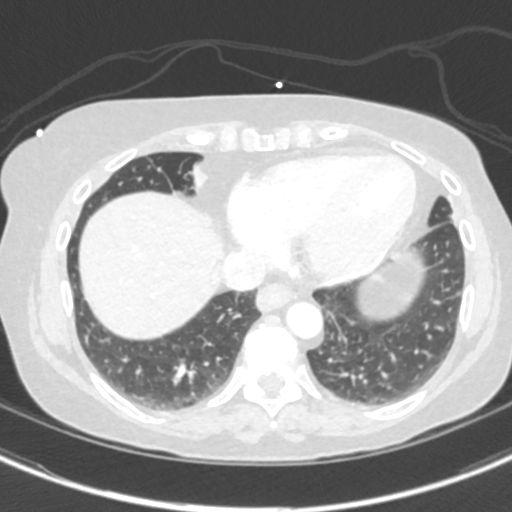
[im 498/641  mediastinal]
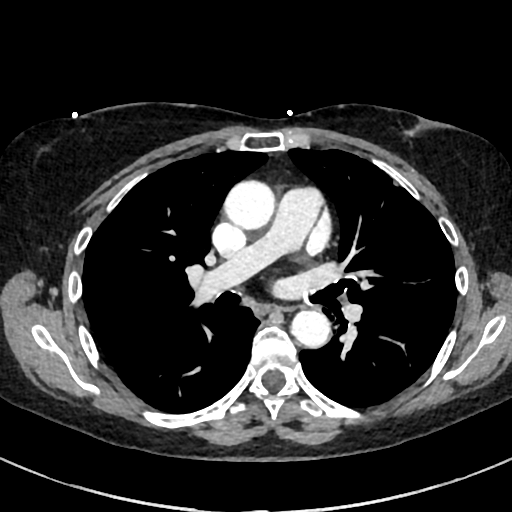
[im 534/641  lung]
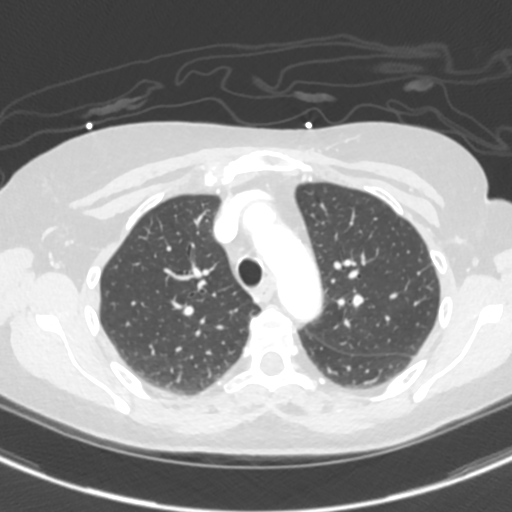
[im 569/641  mediastinal]
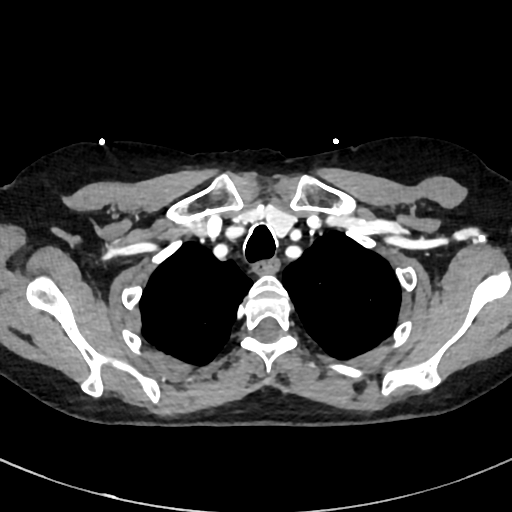
[im 605/641  lung]
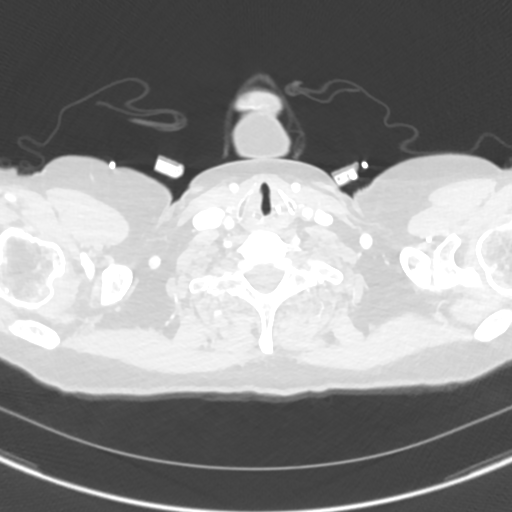

[Series 8: cor · coronal · 0.72mm/px · 1 of 107 slices shown]
[im 54/107  mediastinal]
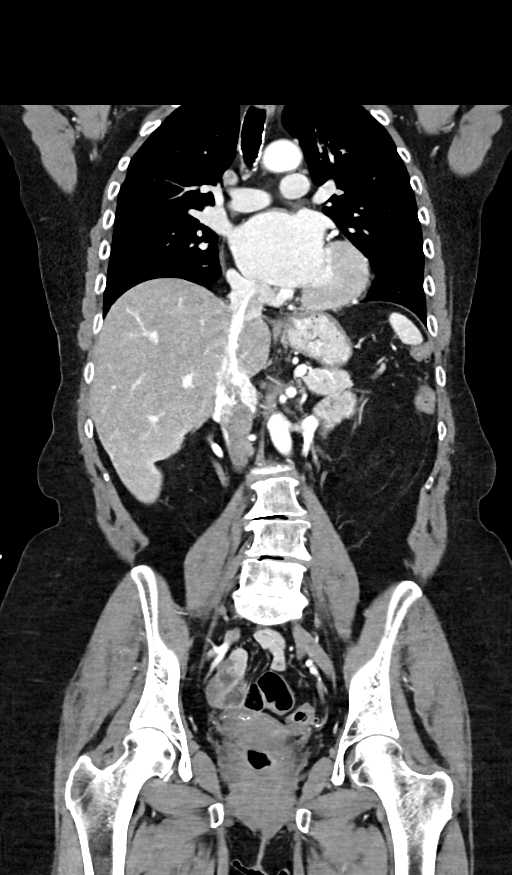

[16 of 36 positions shown; findings below may reference images not displayed]

FINDINGS: CTA CHEST FINDINGS

Cardiovascular: Heart size is mildly enlarged with left atrial
dilatation. There is no significant pericardial fluid, thickening or
pericardial calcification. Aortic atherosclerosis. No definite
coronary artery calcifications. Thickening calcification of the
aortic valve.

Mediastinum/Lymph Nodes: No pathologically enlarged mediastinal or
hilar lymph nodes. Small hiatal hernia. No axillary lymphadenopathy.

Lungs/Pleura: No acute consolidative airspace disease. No pleural
effusions. No suspicious appearing pulmonary nodules or masses are
noted.

Musculoskeletal/Soft Tissues: There are no aggressive appearing
lytic or blastic lesions noted in the visualized portions of the
skeleton.

CTA ABDOMEN AND PELVIS FINDINGS

Hepatobiliary: Liver has a slightly shrunken appearance and nodular
contour, indicative of underlying cirrhosis. Multiple
low-attenuation lesions throughout the liver, largest of which is in
segment 2 of the liver measuring 3.0 x 2.3 cm, compatible with
simple cysts. No suspicious hepatic lesions. Small calcified
granuloma in segment 4A of the liver incidentally noted. Gallbladder
is unremarkable in appearance.

Pancreas: No pancreatic mass. No pancreatic ductal dilatation. No
pancreatic or peripancreatic fluid collections or inflammatory
changes.

Spleen: Unremarkable.

Adrenals/Urinary Tract: Bilateral kidneys and adrenal glands are
normal in appearance. No hydroureteronephrosis. Urinary bladder is
normal in appearance.

Stomach/Bowel: Normal appearance of the stomach. No pathologic
dilatation of small bowel or colon. Numerous colonic diverticulae
are noted, particularly in the descending colon and sigmoid colon,
without surrounding inflammatory changes to suggest an acute
diverticulitis at this time. The appendix is not confidently
identified and may be surgically absent. Regardless, there are no
inflammatory changes noted adjacent to the cecum to suggest the
presence of an acute appendicitis at this time.

Vascular/Lymphatic: Aortic atherosclerosis, with vascular findings
and measurements pertinent to potential TAVR procedure, as detailed
below. No aneurysm or dissection noted in the abdominal or pelvic
vasculature. No lymphadenopathy noted in the abdomen or pelvis.

Reproductive: Uterus and ovaries are unremarkable in appearance.

Other: No significant volume of ascites.  No pneumoperitoneum.

Musculoskeletal: There are no aggressive appearing lytic or blastic
lesions noted in the visualized portions of the skeleton.

VASCULAR MEASUREMENTS PERTINENT TO TAVR:

AORTA:

Minimal Aortic Iiameter-ZW x 13 mm

Severity of Aortic Calcification-minimal

RIGHT PELVIS:

Right Common Iliac Artery -

Minimal Iiameter-B.6 x 7.9 mm

Tortuosity-moderate

Calcification-mild

Right External Iliac Artery -

Minimal Uiameter-J.2 x 6.8 mm

Tortuosity-mild

Calcification-none

Right Common Femoral Artery -

Minimal Viameter-B.U x 6.1 mm

Tortuosity-mild

Calcification - none

LEFT PELVIS:

Left Common Iliac Artery -

Minimal Iiameter-B.6 x 7.9 mm

Tortuosity-moderate

Calcification - none

Left External Iliac Artery -

Minimal Uiameter-J.2 x 6.8 mm

Tortuosity-mild

Calcification - none

Left Common Femoral Artery -

Minimal Viameter-B.U x 6.1 mm

Tortuosity-mild

Calcification - none

Review of the MIP images confirms the above findings.
IMPRESSION: 1. Vascular findings and measurements pertinent to potential TAVR
procedure, as detailed above.
2. Severe thickening calcification of the aortic valve, compatible
with reported clinical history of severe aortic stenosis.
3. Left atrial dilatation.
4. Nodular contour of the liver suggesting underlying cirrhosis.
5. Colonic diverticulosis without evidence of acute diverticulitis
at this time.
6. Additional incidental findings, as above.

## 2021-01-19 ENCOUNTER — Other Ambulatory Visit: Payer: Self-pay

## 2021-01-19 DIAGNOSIS — R110 Nausea: Secondary | ICD-10-CM | POA: Insufficient documentation

## 2021-01-20 ENCOUNTER — Other Ambulatory Visit: Payer: Self-pay

## 2021-01-20 ENCOUNTER — Ambulatory Visit (INDEPENDENT_AMBULATORY_CARE_PROVIDER_SITE_OTHER): Payer: Managed Care, Other (non HMO) | Admitting: Cardiology

## 2021-01-20 VITALS — BP 126/68 | HR 72 | Ht 62.0 in | Wt 132.0 lb

## 2021-01-20 DIAGNOSIS — Z953 Presence of xenogenic heart valve: Secondary | ICD-10-CM

## 2021-01-20 DIAGNOSIS — I480 Paroxysmal atrial fibrillation: Secondary | ICD-10-CM | POA: Diagnosis not present

## 2021-01-20 DIAGNOSIS — Z952 Presence of prosthetic heart valve: Secondary | ICD-10-CM

## 2021-01-20 DIAGNOSIS — E785 Hyperlipidemia, unspecified: Secondary | ICD-10-CM

## 2021-01-20 DIAGNOSIS — I10 Essential (primary) hypertension: Secondary | ICD-10-CM

## 2021-01-20 NOTE — Progress Notes (Signed)
Cardiology Office Note:    Date:  01/20/2021   ID:  Lindsay Guerrero, DOB 09-10-1955, MRN [id number]  PCP:  Zoila Shutter, NP  Cardiologist:  Jenne Campus, MD    Referring MD: Zoila Shutter, NP   Chief Complaint  Patient presents with   Follow-up  I did have few brief episode of atrial fibrillation  History of Present Illness:    Lindsay Guerrero is a 65 y.o. female  with past medical history significant for aortic stenosis, status post aortic valve replacement with a bioprosthesis 21 mm Edwards, paroxysmal atrial fibrillation, essential hypertension, dyslipidemia.  She comes today 2 months of follow-up.  Last time I seen her there was after visiting the emergency room when she was because of atrial fibrillation she was given Cardizem and spontaneously converted to sinus rhythm.  This time she comes for regular follow-up and she tells me that she did have another episode of atrial fibrillation she usually goes to sleep after it happens she takes Cardizem goes to sleep when she wakes up from atrial fibrillation is gone.  Denies have any chest pain tightness squeezing pressure burning chest overall seems to be doing well  Past Medical History:  Diagnosis Date   Adult attention deficit disorder 05/15/2013   Anxiety state 11/19/2012   ICD10   Aortic stenosis 12/13/2017   Moderate by echocardiogram from 2019 mean gradient 25   Aortic stenosis, mild 07/13/2016   Moderate by echocardiogram from 2019 mean gradient 25   Aortic stenosis, severe 07/02/2019   Aortic valve sclerosis 05/24/2015   Formatting of this note might be different from the original. Without stenosis. Last Echo 2012   Asthma    mild   Atrial fibrillation ([place]) 06/28/2018   Atrial fibrillation with RVR ([place]) 06/28/2018   CHF (congestive heart failure) (HCC)    Chronic bilateral low back pain 01/27/2015   Depression    Diabetes mellitus without complication (HCC)    borderline   Dyspnea    on exertion   Dyspnea on  exertion 11/01/2017   Elective surgery for purposes other than treating health conditions 09/27/2010   ICD10   Essential hypertension 06/30/2018   Family history of colon cancer 03/01/2005   Family history of premature coronary artery disease 03/01/2005   GAD (generalized anxiety disorder) 03/01/2005   Formatting of this note might be different from the original. with panic attacks   Hepatitis    due to cytomegalovirus-resolved   History of breast cancer 06/30/2018   History of hiatal hernia    mild,found on a CT   History of malignant neoplasm of breast 03/01/2005   Formatting of this note might be different from the original. node neg; see chartnotes for treatment   HTN (hypertension), benign 07/13/2016   Hyperlipidemia 06/30/2018   Hypokalemia 06/30/2018   Inflamed seborrheic keratosis 08/08/2012   Irritable bowel syndrome 03/01/2005   Malignant neoplasm of upper-inner quadrant of female breast ([place]) 11/01/2017   Primary  diagnosis   Mild intermittent asthma without complication 26/83/4196   Formatting of this note might be different from the original. mild   Mitral regurgitation 12/13/2017   Mild to moderate based on echo from 2019   Mixed hyperlipidemia 07/20/2019   NICM (nonischemic cardiomyopathy) ([place]) 11/01/2015   Osteopenia 01/27/2015   Paroxysmal atrial fibrillation ([place]) 07/14/2019   Pneumonia 2018   Post menopausal problems 01/27/2015   Precordial pain 11/01/2015   Psoriatic arthritis ([place]) 04/25/2015   Recurrent genital HSV (herpes simplex virus)  infection 03/01/2005   Formatting of this note might be different from the original. HSV genital   Restless legs syndrome 03/01/2005   Rosacea 03/01/2005   Routine history and physical examination of adult 09/22/2012   Formatting of this note might be different from the original. PROBLEM LIST:  1. Left node negative breast cancer 2002, stage I, grade 1, status post  lumpectomy and node dissection, external beam radiation,   chemotherapy/Cytoxan, mild left arm lymphedema with cellulitis 2004.  2. Anxiety disorder with panic attacks, family history of same, globus  pharyngeus, palpitations, atypical chest pain, str   S/P aortic valve replacement with bioprosthetic valve 21 mm Edwards INSPIRIS RESILIA  07/02/2019   Size 21 mm   S/P AVR (aortic valve replacement) 07/20/2019   Seborrheic keratoses 09/10/2016   Sleep apnea    borderline   Vaginal dryness 01/27/2015    Past Surgical History:  Procedure Laterality Date   AORTIC VALVE REPLACEMENT N/A 07/02/2019   Procedure: AORTIC VALVE REPLACEMENT (AVR) USING INSPIRIS VALVE SIZE 21MM;  Surgeon: Gaye Pollack, MD;  Location: [place];  Service: Open Heart Surgery;  Laterality: N/A;   BREAST LUMPECTOMY Left 2003   sentinal node bx.   CARDIOVERSION N/A 07/10/2019   Procedure: CARDIOVERSION;  Surgeon: Donato Heinz, MD;  Location: [facility] ENDOSCOPY;  Service: Cardiovascular;  Laterality: N/A;   CESAREAN SECTION     CLIPPING OF ATRIAL APPENDAGE N/A 07/02/2019   Procedure: CLIPPING OF ATRIAL APPENDAGE USING PRO2 CLIP SIZE 40MM;  Surgeon: Gaye Pollack, MD;  Location: [place];  Service: Open Heart Surgery;  Laterality: N/A;   COSMETIC SURGERY     IR THORACENTESIS ASP PLEURAL SPACE W/IMG GUIDE  07/07/2019   RIGHT/LEFT HEART CATH AND CORONARY ANGIOGRAPHY N/A 05/20/2019   Procedure: RIGHT/LEFT HEART CATH AND CORONARY ANGIOGRAPHY;  Surgeon: [name], MD;  Location: [place] CV LAB;  Service: Cardiovascular;  Laterality: N/A;   TEE WITHOUT CARDIOVERSION N/A 07/02/2019   Procedure: TRANSESOPHAGEAL ECHOCARDIOGRAM (TEE);  Surgeon: Gaye Pollack, MD;  Location: [place];  Service: Open Heart Surgery;  Laterality: N/A;   TONSILLECTOMY      Current Medications: Current Meds  Medication Sig   Acetylcysteine (NAC PO) Take 3 tablets by mouth daily. Unknown strength   albuterol (VENTOLIN HFA) 108 (90 Base) MCG/ACT inhaler Inhale 1 puff into the lungs every 4 (four) hours as  needed for shortness of breath or wheezing.   amoxicillin (AMOXIL) 500 MG capsule TAKE 4 CAPSULES BY MOUTH ONE HOUR BEFORE DENTAL PROCEDURES (Patient taking differently: Take 2,000 mg by mouth See admin instructions. TAKE 4 CAPSULES BY MOUTH ONE HOUR BEFORE DENTAL PROCEDURES)   amphetamine-dextroamphetamine (ADDERALL) 20 MG tablet Take 10-20 mg by mouth daily as needed (ADD and/or hypersomnolence).    carvedilol (COREG) 3.125 MG tablet Take 1 tablet (3.125 mg total) by mouth 2 (two) times daily with a meal.   furosemide (LASIX) 20 MG tablet Take 20 mg by mouth daily.   LORazepam (ATIVAN) 1 MG tablet Take 1 mg by mouth at bedtime as needed for anxiety or sleep.    metFORMIN (GLUCOPHAGE-XR) 500 MG 24 hr tablet Take 1 tablet by mouth daily.   ondansetron (ZOFRAN) 8 MG tablet Take 4-8 mg by mouth every 8 (eight) hours as needed for nausea or vomiting (nausea while traveling).   oxymetazoline (AFRIN) 0.05 % nasal spray Place 1 spray into both nostrils daily as needed for congestion (allergies.).   VITAMIN A PO Take 1 capsule by mouth 3 (  three) times a week. Unknown strenght   Vitamin D, Ergocalciferol, (DRISDOL) 1.25 MG (50000 UNIT) CAPS capsule Take 50,000 Units by mouth every 7 (seven) days.   [DISCONTINUED] pravastatin (PRAVACHOL) 40 MG tablet Take 1 tablet (40 mg total) by mouth every evening.     Allergies:   Lisinopril, Iodine, and Metoprolol   Social History   Socioeconomic History   Marital status: Married    Spouse name: Not on file   Number of children: Not on file   Years of education: Not on file   Highest education level: Not on file  Occupational History   Not on file  Tobacco Use   Smoking status: Never   Smokeless tobacco: Never  Vaping Use   Vaping Use: Never used  Substance and Sexual Activity   Alcohol use: Yes    Comment: occasional social drinking (once per month)    Drug use: Never   Sexual activity: Yes    Partners: Male    Birth control/protection: None  Other  Topics Concern   Not on file  Social History Narrative   Not on file   Social Determinants of Health   Financial Resource Strain: Not on file  Food Insecurity: Not on file  Transportation Needs: Not on file  Physical Activity: Not on file  Stress: Not on file  Social Connections: Not on file     Family History: The patient's family history includes Heart Problems in her father and mother; Heart attack in her sister; Hypertension in her mother. ROS:   Please see the history of present illness.    All 14 point review of systems negative except as described per history of present illness  EKGs/Labs/Other Studies Reviewed:      Recent Labs: No results found for requested labs within last 8760 hours.  Recent Lipid Panel    Component Value Date/Time   CHOL 242 (H) 11/01/2017 1030   TRIG 175 (H) 11/01/2017 1030   HDL 47 11/01/2017 1030   CHOLHDL 5.1 (H) 11/01/2017 1030   LDLCALC 160 (H) 11/01/2017 1030   LDLDIRECT 166 (H) 09/15/2020 1326    Physical Exam:    VS:  BP 126/68 (BP Location: Right Arm, Patient Position: Sitting)   Pulse 72   Ht 5\' 2"  (1.575 m)   Wt 132 lb (59.9 kg)   LMP  (LMP Unknown)   SpO2 95%   BMI 24.14 kg/m     Wt Readings from Last 3 Encounters:  01/20/21 132 lb (59.9 kg)  09/15/20 131 lb 3.2 oz (59.5 kg)  04/08/20 134 lb (60.8 kg)     GEN:  Well nourished, well developed in no acute distress HEENT: Normal NECK: No JVD; No carotid bruits LYMPHATICS: No lymphadenopathy CARDIAC: RRR, soft systolic ejection murmur grade 1/6 to 2/6 best heard right upper portion of the sternum no rubs, no gallops RESPIRATORY:  Clear to auscultation without rales, wheezing or rhonchi  ABDOMEN: Soft, non-tender, non-distended MUSCULOSKELETAL:  No edema; No deformity  SKIN: Warm and dry LOWER EXTREMITIES: no swelling NEUROLOGIC:  Alert and oriented x 3 PSYCHIATRIC:  Normal affect   ASSESSMENT:    No diagnosis found. PLAN:    In order of problems listed  above:  Status post arctic valve replacement.  Valve seems to be looking good.  We will continue present management. Paroxysmal atrial fibrillation again we discussed the issue of anticoagulation for CHADS2 Vascor equals 3.  But she does not want to take it.  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  I told her in the future if atrial fibrillation become more frequent we will consider antiarrhythmic therapy. Dyslipidemia with her LDL of 160 and however she still does not want to take any medication for it we did talk about healthy lifestyle exercises diet that she needs to do.   Medication Adjustments/Labs and Tests Ordered: Current medicines are reviewed at length with the patient today.  Concerns regarding medicines are outlined above.  No orders of the defined types were placed in this encounter.  Medication changes: No orders of the defined types were placed in this encounter.   Signed, Park Liter, MD, [facility] 01/20/2021 9:49 AM    [place]

## 2021-01-20 NOTE — Patient Instructions (Signed)

## 2021-03-09 DIAGNOSIS — Z7901 Long term (current) use of anticoagulants: Secondary | ICD-10-CM | POA: Insufficient documentation

## 2021-06-15 ENCOUNTER — Ambulatory Visit: Payer: Managed Care, Other (non HMO) | Admitting: Cardiology

## 2021-06-27 LAB — HM COLONOSCOPY

## 2021-09-19 ENCOUNTER — Encounter: Payer: Self-pay | Admitting: Cardiology

## 2021-09-19 ENCOUNTER — Ambulatory Visit (INDEPENDENT_AMBULATORY_CARE_PROVIDER_SITE_OTHER): Payer: Managed Care, Other (non HMO) | Admitting: Cardiology

## 2021-09-19 VITALS — BP 126/74 | HR 75 | Ht 62.0 in | Wt 127.0 lb

## 2021-09-19 DIAGNOSIS — I480 Paroxysmal atrial fibrillation: Secondary | ICD-10-CM

## 2021-09-19 DIAGNOSIS — Z953 Presence of xenogenic heart valve: Secondary | ICD-10-CM

## 2021-09-19 DIAGNOSIS — E785 Hyperlipidemia, unspecified: Secondary | ICD-10-CM | POA: Diagnosis not present

## 2021-09-19 DIAGNOSIS — R0609 Other forms of dyspnea: Secondary | ICD-10-CM

## 2021-09-19 NOTE — Patient Instructions (Addendum)
Medication Instructions:  Your physician recommends that you continue on your current medications as directed. Please refer to the Current Medication list given to you today.  *If you need a refill on your cardiac medications before your next appointment, please call your pharmacy*   Lab Work: Your physician recommends that you return for lab work in: On date of ECHO You need to have labs done when you are fasting.  You can come Monday through Friday 8:30 am to 12:00 pm and 1:15 to 4:30. You do not need to make an appointment as the order has already been placed. The labs you are going to have done are Lipids.    Testing/Procedures: Your physician has requested that you have an echocardiogram. Echocardiography is a painless test that uses sound waves to create images of your heart. It provides your doctor with information about the size and shape of your heart and how well your heart's chambers and valves are working. This procedure takes approximately one hour. There are no restrictions for this procedure.    Follow-Up: At [facility], you and your health needs are our priority.  As part of our continuing mission to provide you with exceptional heart care, we have created designated Provider Care Teams.  These Care Teams include your primary Cardiologist (physician) and Advanced Practice Providers (APPs -  Physician Assistants and Nurse Practitioners) who all work together to provide you with the care you need, when you need it.  We recommend signing up for the patient portal called "MyChart".  Sign up information is provided on this After Visit Summary.  MyChart is used to connect with patients for Virtual Visits (Telemedicine).  Patients are able to view lab/test results, encounter notes, upcoming appointments, etc.  Non-urgent messages can be sent to your provider as well.   To learn more about what you can do with MyChart, go to [email or web address].    Your next appointment:   6  month(s)  The format for your next appointment:   In Person  Provider:   Jenne Campus, MD    Other Instructions NA

## 2021-09-19 NOTE — Progress Notes (Unsigned)
Cardiology Office Note:    Date:  09/19/2021   ID:  JIM PHILEMON, DOB 02-23-1956, MRN [id number]  PCP:  Zoila Shutter, NP  Cardiologist:  Jenne Campus, MD    Referring MD: Zoila Shutter, NP   Chief Complaint  Patient presents with   Follow-up    Cough resolved    History of Present Illness:    Lindsay Guerrero is a 66 y.o. female    with past medical history significant for aortic stenosis, status post aortic valve replacement with a bioprosthesis 21 mm Edwards, paroxysmal atrial fibrillation, essential hypertension, dyslipidemia.  She comes today 2 months of follow-up.  Overall seems to be doing well.  Denies of any chest pain tightness squeezing pressure mid chest some fatigue and tiredness but otherwise doing well.  Past Medical History:  Diagnosis Date   Adult attention deficit disorder 05/15/2013   Anxiety state 11/19/2012   ICD10   Aortic stenosis 12/13/2017   Moderate by echocardiogram from 2019 mean gradient 25   Aortic stenosis, mild 07/13/2016   Moderate by echocardiogram from 2019 mean gradient 25   Aortic stenosis, severe 07/02/2019   Aortic valve sclerosis 05/24/2015   Formatting of this note might be different from the original. Without stenosis. Last Echo 2012   Asthma    mild   Atrial fibrillation ([place]) 06/28/2018   Atrial fibrillation with RVR ([place]) 06/28/2018   CHF (congestive heart failure) (HCC)    Chronic bilateral low back pain 01/27/2015   Depression    Diabetes mellitus without complication (HCC)    borderline   Dyspnea    on exertion   Dyspnea on exertion 11/01/2017   Elective surgery for purposes other than treating health conditions 09/27/2010   ICD10   Essential hypertension 06/30/2018   Family history of colon cancer 03/01/2005   Family history of premature coronary artery disease 03/01/2005   GAD (generalized anxiety disorder) 03/01/2005   Formatting of this note might be different from the original. with panic attacks   Hepatitis     due to cytomegalovirus-resolved   History of breast cancer 06/30/2018   History of hiatal hernia    mild,found on a CT   History of malignant neoplasm of breast 03/01/2005   Formatting of this note might be different from the original. node neg; see chartnotes for treatment   HTN (hypertension), benign 07/13/2016   Hyperlipidemia 06/30/2018   Hypokalemia 06/30/2018   Inflamed seborrheic keratosis 08/08/2012   Irritable bowel syndrome 03/01/2005   Malignant neoplasm of upper-inner quadrant of female breast ([place]) 11/01/2017   Primary  diagnosis   Mild intermittent asthma without complication 45/80/9983   Formatting of this note might be different from the original. mild   Mitral regurgitation 12/13/2017   Mild to moderate based on echo from 2019   Mixed hyperlipidemia 07/20/2019   NICM (nonischemic cardiomyopathy) ([place]) 11/01/2015   Osteopenia 01/27/2015   Paroxysmal atrial fibrillation ([place]) 07/14/2019   Pneumonia 2018   Post menopausal problems 01/27/2015   Precordial pain 11/01/2015   Psoriatic arthritis ([place]) 04/25/2015   Recurrent genital HSV (herpes simplex virus) infection 03/01/2005   Formatting of this note might be different from the original. HSV genital   Restless legs syndrome 03/01/2005   Rosacea 03/01/2005   Routine history and physical examination of adult 09/22/2012   Formatting of this note might be different from the original. PROBLEM LIST:  1. Left node negative breast cancer 2002, stage I, grade 1, status post  lumpectomy  and node dissection, external beam radiation,  chemotherapy/Cytoxan, mild left arm lymphedema with cellulitis 2004.  2. Anxiety disorder with panic attacks, family history of same, globus  pharyngeus, palpitations, atypical chest pain, str   S/P aortic valve replacement with bioprosthetic valve 21 mm Edwards INSPIRIS RESILIA  07/02/2019   Size 21 mm   S/P AVR (aortic valve replacement) 07/20/2019   Seborrheic keratoses 09/10/2016   Sleep apnea    borderline    Vaginal dryness 01/27/2015    Past Surgical History:  Procedure Laterality Date   AORTIC VALVE REPLACEMENT N/A 07/02/2019   Procedure: AORTIC VALVE REPLACEMENT (AVR) USING INSPIRIS VALVE SIZE 21MM;  Surgeon: Gaye Pollack, MD;  Location: [place];  Service: Open Heart Surgery;  Laterality: N/A;   BREAST LUMPECTOMY Left 2003   sentinal node bx.   CARDIOVERSION N/A 07/10/2019   Procedure: CARDIOVERSION;  Surgeon: Donato Heinz, MD;  Location: [facility] ENDOSCOPY;  Service: Cardiovascular;  Laterality: N/A;   CESAREAN SECTION     CLIPPING OF ATRIAL APPENDAGE N/A 07/02/2019   Procedure: CLIPPING OF ATRIAL APPENDAGE USING PRO2 CLIP SIZE 40MM;  Surgeon: Gaye Pollack, MD;  Location: [place];  Service: Open Heart Surgery;  Laterality: N/A;   COSMETIC SURGERY     IR THORACENTESIS ASP PLEURAL SPACE W/IMG GUIDE  07/07/2019   RIGHT/LEFT HEART CATH AND CORONARY ANGIOGRAPHY N/A 05/20/2019   Procedure: RIGHT/LEFT HEART CATH AND CORONARY ANGIOGRAPHY;  Surgeon: [name], MD;  Location: [place] CV LAB;  Service: Cardiovascular;  Laterality: N/A;   TEE WITHOUT CARDIOVERSION N/A 07/02/2019   Procedure: TRANSESOPHAGEAL ECHOCARDIOGRAM (TEE);  Surgeon: Gaye Pollack, MD;  Location: [place];  Service: Open Heart Surgery;  Laterality: N/A;   TONSILLECTOMY      Current Medications: Current Meds  Medication Sig   Acetylcysteine (NAC PO) Take 3 tablets by mouth daily. Unknown strength   albuterol (VENTOLIN HFA) 108 (90 Base) MCG/ACT inhaler Inhale 1 puff into the lungs every 4 (four) hours as needed for shortness of breath or wheezing.   amoxicillin (AMOXIL) 500 MG capsule TAKE 4 CAPSULES BY MOUTH ONE HOUR BEFORE DENTAL PROCEDURES (Patient taking differently: Take 2,000 mg by mouth See admin instructions. TAKE 4 CAPSULES BY MOUTH ONE HOUR BEFORE DENTAL PROCEDURES)   amphetamine-dextroamphetamine (ADDERALL) 20 MG tablet Take 10-20 mg by mouth daily as needed (ADD and/or hypersomnolence).    carvedilol  (COREG) 3.125 MG tablet Take 1 tablet (3.125 mg total) by mouth 2 (two) times daily with a meal.   furosemide (LASIX) 20 MG tablet Take 20 mg by mouth daily.   LORazepam (ATIVAN) 1 MG tablet Take 1 mg by mouth at bedtime as needed for anxiety or sleep.    ondansetron (ZOFRAN) 8 MG tablet Take 4-8 mg by mouth every 8 (eight) hours as needed for nausea or vomiting (nausea while traveling).   VITAMIN A PO Take 1 capsule by mouth 3 (three) times a week. Unknown strenght   Vitamin D, Ergocalciferol, (DRISDOL) 1.25 MG (50000 UNIT) CAPS capsule Take 50,000 Units by mouth every 7 (seven) days.   [DISCONTINUED] metFORMIN (GLUCOPHAGE-XR) 500 MG 24 hr tablet Take 1 tablet by mouth daily.     Allergies:   Lisinopril, Iodine, and Metoprolol   Social History   Socioeconomic History   Marital status: Married    Spouse name: Not on file   Number of children: Not on file   Years of education: Not on file   Highest education level: Not on file  Occupational  History   Not on file  Tobacco Use   Smoking status: Never   Smokeless tobacco: Never  Vaping Use   Vaping Use: Never used  Substance and Sexual Activity   Alcohol use: Yes    Comment: occasional social drinking (once per month)    Drug use: Never   Sexual activity: Yes    Partners: Male    Birth control/protection: None  Other Topics Concern   Not on file  Social History Narrative   Not on file   Social Determinants of Health   Financial Resource Strain: Low Risk  (06/29/2018)   Overall Financial Resource Strain (CARDIA)    Difficulty of Paying Living Expenses: Not hard at all  Food Insecurity: Not on file  Transportation Needs: Not on file  Physical Activity: Not on file  Stress: Not on file  Social Connections: Not on file     Family History: The patient's family history includes Heart Problems in her father and mother; Heart attack in her sister; Hypertension in her mother. ROS:   Please see the history of present illness.     All 14 point review of systems negative except as described per history of present illness  EKGs/Labs/Other Studies Reviewed:      Recent Labs: No results found for requested labs within last 365 days.  Recent Lipid Panel    Component Value Date/Time   CHOL 242 (H) 11/01/2017 1030   TRIG 175 (H) 11/01/2017 1030   HDL 47 11/01/2017 1030   CHOLHDL 5.1 (H) 11/01/2017 1030   LDLCALC 160 (H) 11/01/2017 1030   LDLDIRECT 166 (H) 09/15/2020 1326    Physical Exam:    VS:  BP 126/74 (BP Location: Left Arm, Patient Position: Sitting)   Pulse 75   Ht '5\' 2"'$  (1.575 m)   Wt 127 lb (57.6 kg)   LMP  (LMP Unknown)   SpO2 99%   BMI 23.23 kg/m     Wt Readings from Last 3 Encounters:  09/19/21 127 lb (57.6 kg)  01/20/21 132 lb (59.9 kg)  09/15/20 131 lb 3.2 oz (59.5 kg)     GEN:  Well nourished, well developed in no acute distress HEENT: Normal NECK: No JVD; No carotid bruits LYMPHATICS: No lymphadenopathy CARDIAC: RRR, soft systolic ejection murmur grade 1/6 best heard right upper portion of the sternum no rubs, no gallops RESPIRATORY:  Clear to auscultation without rales, wheezing or rhonchi  ABDOMEN: Soft, non-tender, non-distended MUSCULOSKELETAL:  No edema; No deformity  SKIN: Warm and dry LOWER EXTREMITIES: no swelling NEUROLOGIC:  Alert and oriented x 3 PSYCHIATRIC:  Normal affect   ASSESSMENT:    1. S/P aortic valve replacement with bioprosthetic valve 21 mm Edwards INSPIRIS RESILIA    2. Dyspnea on exertion   3. Paroxysmal atrial fibrillation (HCC)    PLAN:    In order of problems listed above:  Status post aortic valve replacement.  Time to repeat echocardiogram which I will do.  She described to have some fatigue tiredness shortness of breath we will have to make sure valve is functioning properly Paroxysmal atrial fibrillation denies having any recent episode.  She does not want to be anticoagulated.  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 Dyslipidemia she does not want to take any medications however she would like to have her cholesterol checked which I will do.  Previously her cholesterol is unacceptably high but again she refuses medication for it. Dyspnea on exertion: Echocardiogram  will be done   Medication Adjustments/Labs and Tests Ordered: Current medicines are reviewed at length with the patient today.  Concerns regarding medicines are outlined above.  No orders of the defined types were placed in this encounter.  Medication changes: No orders of the defined types were placed in this encounter.   Signed, Park Liter, MD, [facility] 09/19/2021 11:45 AM    [place]

## 2021-09-21 ENCOUNTER — Encounter: Payer: Self-pay | Admitting: Cardiology

## 2021-11-10 ENCOUNTER — Ambulatory Visit: Payer: Managed Care, Other (non HMO) | Attending: Cardiology

## 2021-11-10 DIAGNOSIS — R0609 Other forms of dyspnea: Secondary | ICD-10-CM | POA: Diagnosis not present

## 2021-11-10 LAB — ECHOCARDIOGRAM COMPLETE
AR max vel: 1.26 cm2
AV Area VTI: 1.29 cm2
AV Area mean vel: 1.36 cm2
AV Mean grad: 8.5 mmHg
AV Peak grad: 14.4 mmHg
Ao pk vel: 1.9 m/s
Area-P 1/2: 5.84 cm2
S' Lateral: 2.9 cm

## 2021-11-12 DIAGNOSIS — Z952 Presence of prosthetic heart valve: Secondary | ICD-10-CM

## 2021-11-12 DIAGNOSIS — I119 Hypertensive heart disease without heart failure: Secondary | ICD-10-CM

## 2021-11-12 DIAGNOSIS — I480 Paroxysmal atrial fibrillation: Secondary | ICD-10-CM

## 2021-11-13 DIAGNOSIS — I119 Hypertensive heart disease without heart failure: Secondary | ICD-10-CM | POA: Diagnosis not present

## 2021-11-13 DIAGNOSIS — Z952 Presence of prosthetic heart valve: Secondary | ICD-10-CM | POA: Diagnosis not present

## 2021-11-13 DIAGNOSIS — I480 Paroxysmal atrial fibrillation: Secondary | ICD-10-CM | POA: Diagnosis not present

## 2021-11-14 ENCOUNTER — Telehealth: Payer: Self-pay | Admitting: Cardiology

## 2021-11-14 NOTE — Telephone Encounter (Signed)
Left message for patient to call office to schedule RH follow up per Dr. Agustin Cree.

## 2021-11-15 ENCOUNTER — Encounter: Payer: Self-pay | Admitting: Cardiology

## 2021-11-17 ENCOUNTER — Ambulatory Visit: Payer: Managed Care, Other (non HMO) | Attending: Cardiology | Admitting: Cardiology

## 2021-11-17 ENCOUNTER — Encounter: Payer: Self-pay | Admitting: Cardiology

## 2021-11-17 VITALS — BP 162/70 | HR 98 | Ht 61.0 in | Wt 130.0 lb

## 2021-11-17 DIAGNOSIS — Z953 Presence of xenogenic heart valve: Secondary | ICD-10-CM | POA: Diagnosis not present

## 2021-11-17 DIAGNOSIS — I480 Paroxysmal atrial fibrillation: Secondary | ICD-10-CM

## 2021-11-17 DIAGNOSIS — I10 Essential (primary) hypertension: Secondary | ICD-10-CM | POA: Diagnosis not present

## 2021-11-17 DIAGNOSIS — R0609 Other forms of dyspnea: Secondary | ICD-10-CM

## 2021-11-17 DIAGNOSIS — I428 Other cardiomyopathies: Secondary | ICD-10-CM

## 2021-11-17 MED ORDER — ACEBUTOLOL HCL 200 MG PO CAPS: 200.0000 mg | ORAL_CAPSULE | Freq: Every day | ORAL | 3 refills | Status: DC

## 2021-11-17 NOTE — Addendum Note (Signed)
Addended by: Jacobo Forest D on: 11/17/2021 11:23 AM   Modules accepted: Orders

## 2021-11-17 NOTE — Patient Instructions (Signed)
Medication Instructions:  Your physician has recommended you make the following change in your medication:   Stop: Carvedilol  START: Acebutolol  '200mg'$  1 tablet daily by mouth    Lab Work: None Ordered If you have labs (blood work) drawn today and your tests are completely normal, you will receive your results only by: [place] (if you have MyChart) OR A paper copy in the mail If you have any lab test that is abnormal or we need to change your treatment, we will call you to review the results.   Testing/Procedures: None Ordered   Follow-Up: At [facility], you and your health needs are our priority.  As part of our continuing mission to provide you with exceptional heart care, we have created designated Provider Care Teams.  These Care Teams include your primary Cardiologist (physician) and Advanced Practice Providers (APPs -  Physician Assistants and Nurse Practitioners) who all work together to provide you with the care you need, when you need it.  We recommend signing up for the patient portal called "MyChart".  Sign up information is provided on this After Visit Summary.  MyChart is used to connect with patients for Virtual Visits (Telemedicine).  Patients are able to view lab/test results, encounter notes, upcoming appointments, etc.  Non-urgent messages can be sent to your provider as well.   To learn more about what you can do with MyChart, go to [email or web address].    Your next appointment:   3 month(s)  The format for your next appointment:   In Person  Provider:   Jenne Campus, MD    Other Instructions NA

## 2021-11-17 NOTE — Progress Notes (Signed)
Cardiology Office Note:    Date:  11/17/2021   ID:  TANYIA GRABBE, DOB 1955-09-13, MRN [id number]  PCP:  Zoila Shutter, NP  Cardiologist:  Jenne Campus, MD    Referring MD: Zoila Shutter, NP   Chief Complaint  Patient presents with   Follow-up    Cardioversion f/up    History of Present Illness:    Lindsay Guerrero is a 66 y.o. female with past medical history significant for aortic valve stenosis status post aortic valve replacement done in April 2021, cardiac catheterization that time showed nonobstructive coronary artery disease, additional problems include essential hypertension, paroxysmal atrial fibrillation.  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  Recently she developed another episode of atrial fibrillation required hospitalization, she eventually end up having cardioversion done.  With 200 J to first attempt.  She is back in my office to talk about more advanced options.  Previously she was reluctant to do much abou atrial fibrillation but now she is ready to pursue potentially atrial fibrillation ablation.  Otherwise she is doing well.  She denies have any chest pain tightness squeezing pressure burning chest no palpitation except for episode of atrial fibrillation.  Interestingly today she is telling me that she probably have more atrial fibrillation that we know about she said from time to time she will feel some palpitations.  Past Medical History:  Diagnosis Date   Adult attention deficit disorder 05/15/2013   Anxiety state 11/19/2012   ICD10   Aortic stenosis 12/13/2017   Moderate by echocardiogram from 2019 mean gradient 25   Aortic stenosis, mild 07/13/2016   Moderate by echocardiogram from 2019 mean gradient 25   Aortic stenosis, severe 07/02/2019   Aortic valve sclerosis 05/24/2015   Formatting of this note might be different from the original. Without stenosis. Last Echo 2012   Asthma    mild   Atrial fibrillation ([place]) 06/28/2018   Atrial  fibrillation with RVR ([place]) 06/28/2018   CHF (congestive heart failure) (HCC)    Chronic bilateral low back pain 01/27/2015   Depression    Diabetes mellitus without complication (HCC)    borderline   Dyspnea    on exertion   Dyspnea on exertion 11/01/2017   Elective surgery for purposes other than treating health conditions 09/27/2010   ICD10   Essential hypertension 06/30/2018   Family history of colon cancer 03/01/2005   Family history of premature coronary artery disease 03/01/2005   GAD (generalized anxiety disorder) 03/01/2005   Formatting of this note might be different from the original. with panic attacks   Hepatitis    due to cytomegalovirus-resolved   History of breast cancer 06/30/2018   History of hiatal hernia    mild,found on a CT   History of malignant neoplasm of breast 03/01/2005   Formatting of this note might be different from the original. node neg; see chartnotes for treatment   HTN (hypertension), benign 07/13/2016   Hyperlipidemia 06/30/2018   Hypokalemia 06/30/2018   Inflamed seborrheic keratosis 08/08/2012   Irritable bowel syndrome 03/01/2005   Malignant neoplasm of upper-inner quadrant of female breast ([place]) 11/01/2017   Primary  diagnosis   Mild intermittent asthma without complication 78/29/5621   Formatting of this note might be different from the original. mild   Mitral regurgitation 12/13/2017   Mild to moderate based on echo from 2019   Mixed hyperlipidemia 07/20/2019   NICM (nonischemic cardiomyopathy) ([place]) 11/01/2015   Osteopenia 01/27/2015  Paroxysmal atrial fibrillation ([place]) 07/14/2019   Pneumonia 2018   Post menopausal problems 01/27/2015   Precordial pain 11/01/2015   Psoriatic arthritis ([place]) 04/25/2015   Recurrent genital HSV (herpes simplex virus) infection 03/01/2005   Formatting of this note might be different from the original. HSV genital   Restless legs syndrome 03/01/2005   Rosacea 03/01/2005   Routine history and physical examination of  adult 09/22/2012   Formatting of this note might be different from the original. PROBLEM LIST:  1. Left node negative breast cancer 2002, stage I, grade 1, status post  lumpectomy and node dissection, external beam radiation,  chemotherapy/Cytoxan, mild left arm lymphedema with cellulitis 2004.  2. Anxiety disorder with panic attacks, family history of same, globus  pharyngeus, palpitations, atypical chest pain, str   S/P aortic valve replacement with bioprosthetic valve 21 mm Edwards INSPIRIS RESILIA  07/02/2019   Size 21 mm   S/P AVR (aortic valve replacement) 07/20/2019   Seborrheic keratoses 09/10/2016   Sleep apnea    borderline   Vaginal dryness 01/27/2015    Past Surgical History:  Procedure Laterality Date   AORTIC VALVE REPLACEMENT N/A 07/02/2019   Procedure: AORTIC VALVE REPLACEMENT (AVR) USING INSPIRIS VALVE SIZE 21MM;  Surgeon: Gaye Pollack, MD;  Location: [facility];  Service: Open Heart Surgery;  Laterality: N/A;   BREAST LUMPECTOMY Left 2003   sentinal node bx.   CARDIOVERSION N/A 07/10/2019   Procedure: CARDIOVERSION;  Surgeon: Donato Heinz, MD;  Location: [facility] ENDOSCOPY;  Service: Cardiovascular;  Laterality: N/A;   CESAREAN SECTION     CLIPPING OF ATRIAL APPENDAGE N/A 07/02/2019   Procedure: CLIPPING OF ATRIAL APPENDAGE USING PRO2 CLIP SIZE 40MM;  Surgeon: Gaye Pollack, MD;  Location: [place];  Service: Open Heart Surgery;  Laterality: N/A;   COSMETIC SURGERY     IR THORACENTESIS ASP PLEURAL SPACE W/IMG GUIDE  07/07/2019   RIGHT/LEFT HEART CATH AND CORONARY ANGIOGRAPHY N/A 05/20/2019   Procedure: RIGHT/LEFT HEART CATH AND CORONARY ANGIOGRAPHY;  Surgeon: [name], MD;  Location: [facility] CV LAB;  Service: Cardiovascular;  Laterality: N/A;   TEE WITHOUT CARDIOVERSION N/A 07/02/2019   Procedure: TRANSESOPHAGEAL ECHOCARDIOGRAM (TEE);  Surgeon: Gaye Pollack, MD;  Location: [place];  Service: Open Heart Surgery;  Laterality: N/A;   TONSILLECTOMY      Current  Medications: Current Meds  Medication Sig   Acetylcysteine (NAC PO) Take 3 tablets by mouth daily. Unknown strength   albuterol (VENTOLIN HFA) 108 (90 Base) MCG/ACT inhaler Inhale 1 puff into the lungs every 4 (four) hours as needed for shortness of breath or wheezing.   amoxicillin (AMOXIL) 500 MG capsule TAKE 4 CAPSULES BY MOUTH ONE HOUR BEFORE DENTAL PROCEDURES (Patient taking differently: Take 2,000 mg by mouth See admin instructions. TAKE 4 CAPSULES BY MOUTH ONE HOUR BEFORE DENTAL PROCEDURES)   amphetamine-dextroamphetamine (ADDERALL) 20 MG tablet Take 10-20 mg by mouth daily as needed (ADD and/or hypersomnolence).    apixaban (ELIQUIS) 5 MG TABS tablet Take 5 mg by mouth 2 (two) times daily.   carvedilol (COREG) 3.125 MG tablet Take 1 tablet (3.125 mg total) by mouth 2 (two) times daily with a meal.   furosemide (LASIX) 20 MG tablet Take 20 mg by mouth daily.   LORazepam (ATIVAN) 1 MG tablet Take 1 mg by mouth at bedtime as needed for anxiety or sleep.    ondansetron (ZOFRAN) 8 MG tablet Take 4-8 mg by mouth every 8 (eight) hours as needed for nausea  or vomiting (nausea while traveling).   oxymetazoline (AFRIN) 0.05 % nasal spray Place 1 spray into both nostrils daily as needed for congestion (allergies.).   VITAMIN A PO Take 1 capsule by mouth 3 (three) times a week. Unknown strenght   Vitamin D, Ergocalciferol, (DRISDOL) 1.25 MG (50000 UNIT) CAPS capsule Take 50,000 Units by mouth every 7 (seven) days.     Allergies:   Lisinopril, Iodine, and Metoprolol   Social History   Socioeconomic History   Marital status: Married    Spouse name: Not on file   Number of children: Not on file   Years of education: Not on file   Highest education level: Not on file  Occupational History   Not on file  Tobacco Use   Smoking status: Never   Smokeless tobacco: Never  Vaping Use   Vaping Use: Never used  Substance and Sexual Activity   Alcohol use: Yes    Comment: occasional social drinking  (once per month)    Drug use: Never   Sexual activity: Yes    Partners: Male    Birth control/protection: None  Other Topics Concern   Not on file  Social History Narrative   Not on file   Social Determinants of Health   Financial Resource Strain: Low Risk  (06/29/2018)   Overall Financial Resource Strain (CARDIA)    Difficulty of Paying Living Expenses: Not hard at all  Food Insecurity: Not on file  Transportation Needs: Not on file  Physical Activity: Not on file  Stress: Not on file  Social Connections: Not on file     Family History: The patient's family history includes Heart Problems in her father and mother; Heart attack in her sister; Hypertension in her mother. ROS:   Please see the history of present illness.    All 14 point review of systems negative except as described per history of present illness  EKGs/Labs/Other Studies Reviewed:    Recent echocardiogram done in November 10, 2021 showed:   1. Left ventricular ejection fraction, by estimation, is 50 to 55%. The  left ventricle has low normal function. The left ventricle has no regional  wall motion abnormalities. Left ventricular diastolic parameters were  normal.   2. Right ventricular systolic function is normal. The right ventricular  size is normal.   3. The mitral valve is normal in structure. Mild mitral valve  regurgitation. No evidence of mitral stenosis.   4. There is a 21 mm Edwards Inspiris valve present in the aortic  position. Procedure Date: 07/02/2019. Echo findings are consistent with  normal structure and function of the aortic valve prosthesis.   5. The inferior vena cava is normal in size with greater than 50%  respiratory variability, suggesting right atrial pressure of 3 mmHg.   Recent Labs: No results found for requested labs within last 365 days.  Recent Lipid Panel    Component Value Date/Time   CHOL 242 (H) 11/01/2017 1030   TRIG 175 (H) 11/01/2017 1030   HDL 47 11/01/2017  1030   CHOLHDL 5.1 (H) 11/01/2017 1030   LDLCALC 160 (H) 11/01/2017 1030   LDLDIRECT 166 (H) 09/15/2020 1326    Physical Exam:    VS:  BP (!) 162/70 (BP Location: Right Arm, Patient Position: Sitting)   Pulse 98   Ht '5\' 1"'$  (1.549 m)   Wt 130 lb (59 kg)   LMP  (LMP Unknown)   SpO2 98%   BMI 24.56 kg/m  Wt Readings from Last 3 Encounters:  11/17/21 130 lb (59 kg)  09/19/21 127 lb (57.6 kg)  01/20/21 132 lb (59.9 kg)     GEN:  Well nourished, well developed in no acute distress HEENT: Normal NECK: No JVD; No carotid bruits LYMPHATICS: No lymphadenopathy CARDIAC: RRR, soft systolic murmur grade 1/6 to 2/6 best heard right upper portion of the sternum, no rubs, no gallops RESPIRATORY:  Clear to auscultation without rales, wheezing or rhonchi  ABDOMEN: Soft, non-tender, non-distended MUSCULOSKELETAL:  No edema; No deformity  SKIN: Warm and dry LOWER EXTREMITIES: no swelling NEUROLOGIC:  Alert and oriented x 3 PSYCHIATRIC:  Normal affect   ASSESSMENT:    1. S/P aortic valve replacement with bioprosthetic valve 21 mm Edwards INSPIRIS RESILIA    2. Dyspnea on exertion   3. Paroxysmal atrial fibrillation (HCC)   4. HTN (hypertension), benign   5. NICM (nonischemic cardiomyopathy) ([place])    PLAN:    In order of problems listed above:  Status post aortic valve replacement valve functioning properly. Dyspnea on exertion multifactorial.  Ejection fraction showed preserved left ventricle ejection fraction, Paroxysmal atrial fibrillation.  She is anticoagulated even though it looks like she did have atrial appendage amputation however after recent cardioversion I prefer to continue her anticoagulated.  She will be referred to our EP team for consideration of more advanced way to manage her atrial fibrillation which is either antiarrhythmic therapy or atrial fibrillation ablation. Essential hypertension: Blood pressure uncontrolled.  She is supposed to be switched from Coreg to  acebutolol in the hospital.  Looks like she left hospital without prescription.  I will put her on it hopefully that will get blood pressure better controlled. Dyslipidemia.  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   Medication Adjustments/Labs and Tests Ordered: Current medicines are reviewed at length with the patient today.  Concerns regarding medicines are outlined above.  No orders of the defined types were placed in this encounter.  Medication changes: No orders of the defined types were placed in this encounter.   Signed, Park Liter, MD, [facility] 11/17/2021 11:06 AM    [place]

## 2021-12-10 ENCOUNTER — Telehealth: Payer: Self-pay | Admitting: Physician Assistant

## 2021-12-10 NOTE — Telephone Encounter (Signed)
   The patient called the answering service after-hours today. Recent note 11/17/21 reviewed, back in atrial fibrillation following cardioversion. Referred to EP team for antiarrhythmic therapy which is pending. This weekend she's had more symptoms with her afib despite compliance with acebutolol as well as PRN diltiazem she had every 8 hours, so she plans on going to the hospital today and inquires about potential cardioversion tomorrow. She does report that she does not prefer to take anticoagulation twice daily, only PRN, due to h/o bleeding. Therefore unlikely to be able to proceed directly to cardioversion without TEE. I do not have details of her prior cardioversion so unclear whether she may need consideration of antiarrhythmic loading prior to repeat shock. Additionally, her plans for anticoagulation going forward will need to be addressed in the hospital given sporadic Eliquis dosing. 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 We will let Dr. Agustin Cree know so he is aware.  Charlie Pitter, PA-C

## 2021-12-11 DIAGNOSIS — I350 Nonrheumatic aortic (valve) stenosis: Secondary | ICD-10-CM | POA: Diagnosis not present

## 2021-12-11 DIAGNOSIS — I4891 Unspecified atrial fibrillation: Secondary | ICD-10-CM | POA: Diagnosis not present

## 2021-12-11 DIAGNOSIS — Z952 Presence of prosthetic heart valve: Secondary | ICD-10-CM | POA: Diagnosis not present

## 2021-12-18 ENCOUNTER — Encounter: Payer: Self-pay | Admitting: Cardiology

## 2022-01-04 ENCOUNTER — Ambulatory Visit: Payer: Managed Care, Other (non HMO) | Attending: Cardiology | Admitting: Cardiology

## 2022-01-04 ENCOUNTER — Encounter: Payer: Self-pay | Admitting: Cardiology

## 2022-01-04 VITALS — BP 148/64 | HR 78 | Ht 61.5 in | Wt 135.0 lb

## 2022-01-04 DIAGNOSIS — D6869 Other thrombophilia: Secondary | ICD-10-CM

## 2022-01-04 DIAGNOSIS — I4819 Other persistent atrial fibrillation: Secondary | ICD-10-CM

## 2022-01-04 MED ORDER — DRONEDARONE HCL 400 MG PO TABS: 400.0000 mg | ORAL_TABLET | Freq: Two times a day (BID) | ORAL | 6 refills | Status: DC

## 2022-01-04 NOTE — Progress Notes (Signed)
Electrophysiology Office Note   Date:  01/04/2022   ID:  Lindsay Guerrero, DOB 08-27-1955, MRN [id number]  PCP:  Lindsay Shutter, NP  Cardiologist:  Agustin Cree Primary Electrophysiologist:  Lindsay Sian Meredith Leeds, MD    Chief Complaint: AF   History of Present Illness: Lindsay Guerrero is a 66 y.o. female who is being seen today for the evaluation of AF at the request of Lindsay Liter, MD. Presenting today for electrophysiology evaluation.  She has a history seen for aortic stenosis post AVR April 2021, nonobstructive coronary artery disease, hypertension, paroxysmal atrial fibrillation.  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  She has had episodes of atrial fibrillation requiring hospitalization and cardioversion.  She feels quite poorly in atrial fibrillation.  She has weakness, fatigue, shortness of breath.  She has been having short episodes of atrial fibrillation, but has had up to 2 cardioversions.  Today, she denies symptoms of palpitations, chest pain, shortness of breath, orthopnea, PND, lower extremity edema, claudication, dizziness, presyncope, syncope, bleeding, or neurologic sequela. The patient is tolerating medications without difficulties.    Past Medical History:  Diagnosis Date   Adult attention deficit disorder 05/15/2013   Anxiety state 11/19/2012   ICD10   Aortic stenosis 12/13/2017   Moderate by echocardiogram from 2019 mean gradient 25   Aortic stenosis, mild 07/13/2016   Moderate by echocardiogram from 2019 mean gradient 25   Aortic stenosis, severe 07/02/2019   Aortic valve sclerosis 05/24/2015   Formatting of this note might be different from the original. Without stenosis. Last Echo 2012   Asthma    mild   Atrial fibrillation ([place]) 06/28/2018   Atrial fibrillation with RVR ([place]) 06/28/2018   CHF (congestive heart failure) (HCC)    Chronic bilateral low back pain 01/27/2015   Depression    Diabetes mellitus without complication  (HCC)    borderline   Dyspnea    on exertion   Dyspnea on exertion 11/01/2017   Elective surgery for purposes other than treating health conditions 09/27/2010   ICD10   Essential hypertension 06/30/2018   Family history of colon cancer 03/01/2005   Family history of premature coronary artery disease 03/01/2005   GAD (generalized anxiety disorder) 03/01/2005   Formatting of this note might be different from the original. with panic attacks   Hepatitis    due to cytomegalovirus-resolved   History of breast cancer 06/30/2018   History of hiatal hernia    mild,found on a CT   History of malignant neoplasm of breast 03/01/2005   Formatting of this note might be different from the original. node neg; see chartnotes for treatment   HTN (hypertension), benign 07/13/2016   Hyperlipidemia 06/30/2018   Hypokalemia 06/30/2018   Inflamed seborrheic keratosis 08/08/2012   Irritable bowel syndrome 03/01/2005   Malignant neoplasm of upper-inner quadrant of female breast ([place]) 11/01/2017   Primary  diagnosis   Mild intermittent asthma without complication 13/10/6576   Formatting of this note might be different from the original. mild   Mitral regurgitation 12/13/2017   Mild to moderate based on echo from 2019   Mixed hyperlipidemia 07/20/2019   NICM (nonischemic cardiomyopathy) ([place]) 11/01/2015   Osteopenia 01/27/2015   Paroxysmal atrial fibrillation ([place]) 07/14/2019   Pneumonia 2018   Post menopausal problems 01/27/2015   Precordial pain 11/01/2015   Psoriatic arthritis ([place]) 04/25/2015   Recurrent genital HSV (herpes simplex virus) infection 03/01/2005   Formatting of this note might be  different from the original. HSV genital   Restless legs syndrome 03/01/2005   Rosacea 03/01/2005   Routine history and physical examination of adult 09/22/2012   Formatting of this note might be different from the original. PROBLEM LIST:  1. Left node negative breast cancer 2002, stage I, grade 1, status post  lumpectomy  and node dissection, external beam radiation,  chemotherapy/Cytoxan, mild left arm lymphedema with cellulitis 2004.  2. Anxiety disorder with panic attacks, family history of same, globus  pharyngeus, palpitations, atypical chest pain, str   S/P aortic valve replacement with bioprosthetic valve 21 mm Edwards INSPIRIS RESILIA  07/02/2019   Size 21 mm   S/P AVR (aortic valve replacement) 07/20/2019   Seborrheic keratoses 09/10/2016   Sleep apnea    borderline   Vaginal dryness 01/27/2015   Past Surgical History:  Procedure Laterality Date   AORTIC VALVE REPLACEMENT N/A 07/02/2019   Procedure: AORTIC VALVE REPLACEMENT (AVR) USING INSPIRIS VALVE SIZE 21MM;  Surgeon: Gaye Pollack, MD;  Location: [facility];  Service: Open Heart Surgery;  Laterality: N/A;   BREAST LUMPECTOMY Left 2003   sentinal node bx.   CARDIOVERSION N/A 07/10/2019   Procedure: CARDIOVERSION;  Surgeon: Donato Heinz, MD;  [facility] ENDOSCOPY;  Service: Cardiovascular;  Laterality: N/A;   CESAREAN SECTION     CLIPPING OF ATRIAL APPENDAGE N/A 07/02/2019   Procedure: CLIPPING OF ATRIAL APPENDAGE USING PRO2 CLIP SIZE 40MM;  Surgeon: Gaye Pollack, MD;  Location: [facility];  Service: Open Heart Surgery;  Laterality: N/A;   COSMETIC SURGERY     IR THORACENTESIS ASP PLEURAL SPACE W/IMG GUIDE  07/07/2019   RIGHT/LEFT HEART CATH AND CORONARY ANGIOGRAPHY N/A 05/20/2019   Procedure: RIGHT/LEFT HEART CATH AND CORONARY ANGIOGRAPHY;  Surgeon: [name], MD;  Location: [facility] CV [facility];  Service: Cardiovascular;  Laterality: N/A;   TEE WITHOUT CARDIOVERSION N/A 07/02/2019   Procedure: TRANSESOPHAGEAL ECHOCARDIOGRAM (TEE);  Surgeon: Gaye Pollack, MD;  Location: [facility];  Service: Open Heart Surgery;  Laterality: N/A;   TONSILLECTOMY       Current Outpatient Medications  Medication Sig Dispense Refill   acebutolol (SECTRAL) 200 MG capsule Take 1 capsule (200 mg total) by mouth daily. 90 capsule 3   Acetylcysteine (NAC PO) Take 3  tablets by mouth daily. Unknown strength     albuterol (VENTOLIN HFA) 108 (90 Base) MCG/ACT inhaler Inhale 1 puff into the lungs every 4 (four) hours as needed for shortness of breath or wheezing.     amoxicillin (AMOXIL) 500 MG capsule TAKE 4 CAPSULES BY MOUTH ONE HOUR BEFORE DENTAL PROCEDURES (Patient taking differently: Take 2,000 mg by mouth See admin instructions. TAKE 4 CAPSULES BY MOUTH ONE HOUR BEFORE DENTAL PROCEDURES) 4 capsule 5   amphetamine-dextroamphetamine (ADDERALL) 20 MG tablet Take 10-20 mg by mouth daily as needed (ADD and/or hypersomnolence).      apixaban (ELIQUIS) 5 MG TABS tablet Take 5 mg by mouth 2 (two) times daily.     dronedarone (MULTAQ) 400 MG tablet Take 1 tablet (400 mg total) by mouth 2 (two) times daily with a meal. 60 tablet 6   furosemide (LASIX) 20 MG tablet Take 20 mg by mouth daily.     LORazepam (ATIVAN) 1 MG tablet Take 1 mg by mouth at bedtime as needed for anxiety or sleep.      ondansetron (ZOFRAN) 8 MG tablet Take 4-8 mg by mouth every 8 (eight) hours as needed for nausea or vomiting (nausea while traveling).  oxymetazoline (AFRIN) 0.05 % nasal spray Place 1 spray into both nostrils daily as needed for congestion (allergies.).     VITAMIN A PO Take 1 capsule by mouth 3 (three) times a week. Unknown strenght     Vitamin D, Ergocalciferol, (DRISDOL) 1.25 MG (50000 UNIT) CAPS capsule Take 50,000 Units by mouth every 7 (seven) days.     No current facility-administered medications for this visit.    Allergies:   Lisinopril, Iodine, and Metoprolol   Social History:  The patient  reports that she has never smoked. She has never used smokeless tobacco. She reports current alcohol use. She reports that she does not use drugs.   Family History:  The patient's family history includes Heart Problems in her father and mother; Heart attack in her sister; Hypertension in her mother.    ROS:  Please see the history of present illness.   Otherwise, review of  systems is positive for none.   All other systems are reviewed and negative.    PHYSICAL EXAM: VS:  BP (!) 148/64   Pulse 78   Ht 5' 1.5" (1.562 m)   Wt 135 lb (61.2 kg)   LMP  (LMP Unknown)   SpO2 98%   BMI 25.09 kg/m  , BMI Body mass index is 25.09 kg/m. GEN: Well nourished, well developed, in no acute distress  HEENT: normal  Neck: no JVD, carotid bruits, or masses Cardiac: RRR; no murmurs, rubs, or gallops,no edema  Respiratory:  clear to auscultation bilaterally, normal work of breathing GI: soft, nontender, nondistended, + BS MS: no deformity or atrophy  Skin: warm and dry Neuro:  Strength and sensation are intact Psych: euthymic mood, full affect  EKG:  EKG is not ordered today. Personal review of the ekg ordered 09/19/21 shows sinus rhythm, PVCs  Recent Labs: No results found for requested labs within last 365 days.    Lipid Panel     Component Value Date/Time   CHOL 242 (H) 11/01/2017 1030   TRIG 175 (H) 11/01/2017 1030   HDL 47 11/01/2017 1030   CHOLHDL 5.1 (H) 11/01/2017 1030   LDLCALC 160 (H) 11/01/2017 1030   LDLDIRECT 166 (H) 09/15/2020 1326     Wt Readings from Last 3 Encounters:  01/04/22 135 lb (61.2 kg)  11/17/21 130 lb (59 kg)  09/19/21 127 lb (57.6 kg)      Other studies Reviewed: Additional studies/ records that were reviewed today include: TTE 11/10/2021 Review of the above records today demonstrates:   1. Left ventricular ejection fraction, by estimation, is 50 to 55%. The  left ventricle has low normal function. The left ventricle has no regional  wall motion abnormalities. Left ventricular diastolic parameters were  normal.   2. Right ventricular systolic function is normal. The right ventricular  size is normal.   3. The mitral valve is normal in structure. Mild mitral valve  regurgitation. No evidence of mitral stenosis.   4. There is a 21 mm Edwards Inspiris valve present in the aortic  position. Procedure Date: 07/02/2019. Echo  findings are consistent with  normal structure and function of the aortic valve prosthesis.   5. The inferior vena cava is normal in size with greater than 50%  respiratory variability, suggesting right atrial pressure of 3 mmHg.    ASSESSMENT AND PLAN:  1.  Persistent atrial fibrillation: CHA2DS2-VASc of 3.  Currently on Eliquis 5 mg twice daily.  She would prefer to avoid antiarrhythmic medications.  She has quite a  few symptoms due to her atrial fibrillation.  We Jasenia Weilbacher plan for ablation.  Risk and benefits of been discussed.  She understands these risks and is agreed to the procedure.  She would have to wait a few months and is symptomatic in atrial fibrillation, Tinzlee Craker start Multaq 400 mg twice daily for rhythm control.  Risk, benefits, and alternatives to EP study and radiofrequency ablation for afib were also discussed in detail today. These risks include but are not limited to stroke, bleeding, vascular damage, tamponade, perforation, damage to the esophagus, lungs, and other structures, pulmonary vein stenosis, worsening renal function, and death. The patient understands these risk and wishes to proceed.  We Zuriel Yeaman therefore proceed with catheter ablation at the next available time.  Carto, ICE, anesthesia are requested for the procedure.  Damont Balles also obtain CT PV protocol prior to the procedure to exclude LAA thrombus and further evaluate atrial anatomy.   2.  Aortic stenosis: Status post AVR.  Stable on most recent echo.  Plan per primary cardiology.  3.  Secondary hypercoagulable state: Currently on Eliquis for atrial fibrillation as above.  5.  Hypertension: Mildly elevated today.  Usually well controlled.  No changes.  Current medicines are reviewed at length with the patient today.   The patient does not have concerns regarding her medicines.  The following changes were made today: Start Multaq  Labs/ tests ordered today include:  No orders of the defined types were placed in this  encounter.    Disposition:   FU with Josselyne Onofrio 3 months  Signed, Anquan Azzarello Meredith Leeds, MD  01/04/2022 1:40 PM     [place] [address] [phone] (office) [phone] (fax)

## 2022-01-04 NOTE — Telephone Encounter (Signed)
Followed up with pt Informed that I would forward to pharmD and our triage team (as I am out of the office until next Tuesday). Pt understands not to take the samples of Multaq given to her until she hears back from our office. Patient verbalized understanding and agreeable to plan.

## 2022-01-04 NOTE — Patient Instructions (Addendum)
Medication Instructions:  Your physician has recommended you make the following change in your medication:  START Multaq 400 mg twice daily  *If you need a refill on your cardiac medications before your next appointment, please call your pharmacy*   Lab Work: Pre procedure labs -- see procedure instruction letter:  BMP & CBC  If you have labs (blood work) drawn today and your tests are completely normal, you will receive your results only by: [place] (if you have MyChart) OR A paper copy in the mail If you have any lab test that is abnormal or we need to change your treatment, we will call you to review the results.   Testing/Procedures: Your physician has requested that you have cardiac CT within 7 days PRIOR to your ablation. Cardiac computed tomography (CT) is a painless test that uses an x-ray machine to take clear, detailed pictures of your heart.  Please follow instruction below located under "other instructions". You will get a call from our office to schedule the date for this test.  Your physician has recommended that you have an ablation. Catheter ablation is a medical procedure used to treat some cardiac arrhythmias (irregular heartbeats). During catheter ablation, a long, thin, flexible tube is put into a blood vessel in your groin (upper thigh), or neck. This tube is called an ablation catheter. It is then guided to your heart through the blood vessel. Radio frequency waves destroy small areas of heart tissue where abnormal heartbeats may cause an arrhythmia to start. Please follow instruction letter given to you today. You will be scheduled for 05/22/2022.  We will go over CT & Ablation instructions at you 04/30/22 office visit  Follow-Up: At [facility], you and your health needs are our priority.  As part of our continuing mission to provide you with exceptional heart care, we have created designated Provider Care Teams.  These Care Teams include your primary  Cardiologist (physician) and Advanced Practice Providers (APPs -  Physician Assistants and Nurse Practitioners) who all work together to provide you with the care you need, when you need it.  Your next appointment:   1 month(s) after your ablation  The format for your next appointment:   In Person  Provider:   [facility]   Thank you for choosing [facility]!!   Trinidad Curet, RN [phone]    Other Instructions  Dronedarone Tablets What is this medication? DRONEDARONE (droe NE da rone) treats a fast or irregular heartbeat (arrhythmia). It works by slowing down overactive electric signals in the heart, which stabilizes your heart rhythm. It belongs to a group of medications called antiarrhythmics. This medicine may be used for other purposes; ask your health care provider or pharmacist if you have questions. COMMON BRAND NAME(S): Multaq What should I tell my care team before I take this medication? They need to know if you have any of these conditions: Heart failure History of irregular heartbeat Liver disease Liver or lung problems with the past use of amiodarone Low levels of magnesium in the blood Low levels of potassium in the blood Other heart disease An unusual or allergic reaction to dronedarone, other medications, foods, dyes, or preservatives Pregnant or trying to get pregnant Breast-feeding How should I use this medication? Take this medication by mouth with a glass of water. Follow the directions on the prescription label. Take one tablet with the morning meal and one tablet with the evening meal. Do not take your medication more often than directed. Do  not stop taking except on the advice of your care team. A special MedGuide will be given to you by the pharmacist with each prescription and refill. Be sure to read this information carefully each time. Talk to your care team about the use of this medication in children. Special care may be  needed. Overdosage: If you think you have taken too much of this medicine contact a poison control center or emergency room at once. NOTE: This medicine is only for you. Do not share this medicine with others. What if I miss a dose? If you miss a dose, take it as soon as you can. If it is almost time for your next dose, take only that dose. Do not take double or extra doses. What may interact with this medication? Do not take this medication with any of the following: Adagrasib Arsenic trioxide Certain antibiotics, such as clarithromycin, erythromycin, pentamidine, telithromycin, troleandomycin Certain medications for depression, such as tricyclic antidepressants Certain medications for fungal infections, such as fluconazole, itraconazole, ketoconazole, posaconazole, voriconazole Certain medications for irregular heart beat, such as amiodarone, disopyramide, flecainide, ibutilide, quinidine, propafenone, sotalol Certain medications for malaria, such as chloroquine, halofantrine Cisapride Cyclosporine Droperidol Haloperidol Methadone Nefazodone Other medications that cause heart rhythm changes, such as degarelix, encorafenib, entrectinib, eribulin, goserelin, lapatinib Phenothiazines, such as chlorpromazine, mesoridazine, prochlorperazine, thioridazine Pimozide Ritonavir Ziprasidone This medication may also interact with the following: Certain medications for blood pressure, heart disease, or irregular heart beat, such as diltiazem, metoprolol, propranolol, verapamil Certain medications for cholesterol, such as atorvastatin, lovastatin, simvastatin Certain medications for seizures, such as carbamazepine, phenobarbital, phenytoin Dabigatran Digoxin Dofetilide Grapefruit juice Rifampin Sirolimus St. John's wort Tacrolimus Warfarin This list may not describe all possible interactions. Give your health care provider a list of all the medicines, herbs, non-prescription drugs, or  dietary supplements you use. Also tell them if you smoke, drink alcohol, or use illegal drugs. Some items may interact with your medicine. What should I watch for while using this medication? Your condition will be monitored closely when you first begin therapy. Often, this medication is first started in a hospital or other monitored health care setting. Once you are on maintenance therapy, visit your care team for regular checks on your progress. Because your condition and use of this medication carry some risk, it is a good idea to carry an identification card, necklace or bracelet with details of your condition, medications, and care team. You may get drowsy or dizzy. Do not drive, use machinery, or do anything that needs mental alertness until you know how this medication affects you. Do not stand or sit up quickly, especially if you are an older patient. This reduces the risk of dizzy or fainting spells. What side effects may I notice from receiving this medication? Side effects that you should report to your care team as soon as possible: Allergic reactions--skin rash, itching, hives, swelling of the face, lips, tongue, or throat Slow heartbeat--dizziness, feeling faint or lightheaded, confusion, trouble breathing, unusual weakness or fatigue Heart failure--shortness of breath, swelling of the ankles, feet, or hands, sudden weight gain, unusual weakness or fatigue Heart rhythm changes--fast or irregular heartbeat, dizziness, feeling faint or lightheaded, chest pain, trouble breathing Kidney injury--decrease in the amount of urine, swelling of the ankles, hands, or feet Liver injury--right upper belly pain, loss of appetite, nausea, light-colored stool, dark yellow or brown urine, yellowing skin or eyes, unusual weakness or fatigue Lung injury--shortness of breath or trouble breathing, cough, spitting up blood, chest pain,  fever Side effects that usually do not require medical attention (report to  your care team if they continue or are bothersome): Diarrhea Nausea Stomach pain Vomiting This list may not describe all possible side effects. Call your doctor for medical advice about side effects. You may report side effects to FDA at 1-800-FDA-1088. Where should I keep my medication? Keep out of the reach of children. Store at room temperature between 15 and 30 degrees C (59 and 86 degrees F). Throw away any unused medication after the expiration date. NOTE: This sheet is a summary. It may not cover all possible information. If you have questions about this medicine, talk to your doctor, pharmacist, or health care provider.  2023 Elsevier/Gold Standard (2020-09-06 00:00:00)   Cardiac Ablation Cardiac ablation is a procedure to destroy (ablate) some heart tissue that is sending bad signals. These bad signals cause problems in heart rhythm. The heart has many areas that make these signals. If there are problems in these areas, they can make the heart beat in a way that is not normal. Destroying some tissues can help make the heart rhythm normal. Tell your doctor about: Any allergies you have. All medicines you are taking. These include vitamins, herbs, eye drops, creams, and over-the-counter medicines. Any problems you or family members have had with medicines that make you fall asleep (anesthetics). Any blood disorders you have. Any surgeries you have had. Any medical conditions you have, such as kidney failure. Whether you are pregnant or may be pregnant. What are the risks? This is a safe procedure. But problems may occur, including: Infection. Bruising and bleeding. Bleeding into the chest. Stroke or blood clots. Damage to nearby areas of your body. Allergies to medicines or dyes. The need for a pacemaker if the normal system is damaged. Failure of the procedure to treat the problem. What happens before the procedure? Medicines Ask your doctor about: Changing or stopping  your normal medicines. This is important. Taking aspirin and ibuprofen. Do not take these medicines unless your doctor tells you to take them. Taking other medicines, vitamins, herbs, and supplements. General instructions Follow instructions from your doctor about what you cannot eat or drink. Plan to have someone take you home from the hospital or clinic. If you will be going home right after the procedure, plan to have someone with you for 24 hours. Ask your doctor what steps will be taken to prevent infection. What happens during the procedure?  An IV tube will be put into one of your veins. You will be given a medicine to help you relax. The skin on your neck or groin will be numbed. A cut (incision) will be made in your neck or groin. A needle will be put through your cut and into a large vein. A tube (catheter) will be put into the needle. The tube will be moved to your heart. Dye may be put through the tube. This helps your doctor see your heart. Small devices (electrodes) on the tube will send out signals. A type of energy will be used to destroy some heart tissue. The tube will be taken out. Pressure will be held on your cut. This helps stop bleeding. A bandage will be put over your cut. The exact procedure may vary among doctors and hospitals. What happens after the procedure? You will be watched until you leave the hospital or clinic. This includes checking your heart rate, breathing rate, oxygen, and blood pressure. Your cut will be watched for bleeding. You  will need to lie still for a few hours. Do not drive for 24 hours or as long as your doctor tells you. Summary Cardiac ablation is a procedure to destroy some heart tissue. This is done to treat heart rhythm problems. Tell your doctor about any medical conditions you may have. Tell him or her about all medicines you are taking to treat them. This is a safe procedure. But problems may occur. These include infection,  bruising, bleeding, and damage to nearby areas of your body. Follow what your doctor tells you about food and drink. You may also be told to change or stop some of your medicines. After the procedure, do not drive for 24 hours or as long as your doctor tells you. This information is not intended to replace advice given to you by your health care provider. Make sure you discuss any questions you have with your health care provider. Document Revised: 05/19/2021 Document Reviewed: 01/29/2019 Elsevier Patient Education  [place].

## 2022-01-08 ENCOUNTER — Encounter: Payer: Self-pay | Admitting: Cardiology

## 2022-01-08 DIAGNOSIS — R0602 Shortness of breath: Secondary | ICD-10-CM

## 2022-01-08 DIAGNOSIS — R252 Cramp and spasm: Secondary | ICD-10-CM

## 2022-01-11 LAB — BASIC METABOLIC PANEL
BUN/Creatinine Ratio: 23 (ref 12–28)
BUN: 19 mg/dL (ref 8–27)
CO2: 27 mmol/L (ref 20–29)
Calcium: 10 mg/dL (ref 8.7–10.3)
Chloride: 96 mmol/L (ref 96–106)
Creatinine, Ser: 0.83 mg/dL (ref 0.57–1.00)
Glucose: 131 mg/dL — ABNORMAL HIGH (ref 70–99)
Potassium: 4.6 mmol/L (ref 3.5–5.2)
Sodium: 139 mmol/L (ref 134–144)
eGFR: 77 mL/min/{1.73_m2} (ref >59)

## 2022-01-11 LAB — MAGNESIUM: Magnesium: 2.2 mg/dL (ref 1.6–2.3)

## 2022-02-08 ENCOUNTER — Telehealth: Payer: Self-pay | Admitting: Cardiology

## 2022-02-08 NOTE — Telephone Encounter (Signed)
Pt aware that someone will call her to arrange CT but that I have not placed the order yet.  Aware that she will get blood work at the [facility] and will go over CT & procedure instructions then. Patient verbalized understanding and agreeable to plan.

## 2022-02-08 NOTE — Telephone Encounter (Signed)
Patient is called stating she is to have a CT and lab work done prior to her office visit on 04/30/22, but she doesn't know when she is to have that done.

## 2022-03-13 ENCOUNTER — Other Ambulatory Visit: Payer: Self-pay

## 2022-03-14 ENCOUNTER — Encounter: Payer: Self-pay | Admitting: Cardiology

## 2022-03-14 ENCOUNTER — Ambulatory Visit: Payer: Managed Care, Other (non HMO) | Attending: Cardiology | Admitting: Cardiology

## 2022-03-14 VITALS — BP 118/74 | HR 78 | Ht 62.0 in | Wt 136.0 lb

## 2022-03-14 DIAGNOSIS — Z953 Presence of xenogenic heart valve: Secondary | ICD-10-CM | POA: Diagnosis not present

## 2022-03-14 DIAGNOSIS — I10 Essential (primary) hypertension: Secondary | ICD-10-CM

## 2022-03-14 DIAGNOSIS — R0609 Other forms of dyspnea: Secondary | ICD-10-CM

## 2022-03-14 DIAGNOSIS — I480 Paroxysmal atrial fibrillation: Secondary | ICD-10-CM | POA: Diagnosis not present

## 2022-03-14 NOTE — Patient Instructions (Signed)
Medication Instructions:  Your physician recommends that you continue on your current medications as directed. Please refer to the Current Medication list given to you today.  *If you need a refill on your cardiac medications before your next appointment, please call your pharmacy*   Lab Work: 2nd [place]- today If you have labs (blood work) drawn today and your tests are completely normal, you will receive your results only by: [place] (if you have MyChart) OR A paper copy in the mail If you have any lab test that is abnormal or we need to change your treatment, we will call you to review the results.   Testing/Procedures: None Ordered   Follow-Up: At [facility], you and your health needs are our priority.  As part of our continuing mission to provide you with exceptional heart care, we have created designated Provider Care Teams.  These Care Teams include your primary Cardiologist (physician) and Advanced Practice Providers (APPs -  Physician Assistants and Nurse Practitioners) who all work together to provide you with the care you need, when you need it.  We recommend signing up for the patient portal called "MyChart".  Sign up information is provided on this After Visit Summary.  MyChart is used to connect with patients for Virtual Visits (Telemedicine).  Patients are able to view lab/test results, encounter notes, upcoming appointments, etc.  Non-urgent messages can be sent to your provider as well.   To learn more about what you can do with MyChart, go to [email or web address].    Your next appointment:   5 month(s)  The format for your next appointment:   In Person  Provider:   Jenne Campus, MD    Other Instructions NA

## 2022-03-14 NOTE — Progress Notes (Unsigned)
Cardiology Office Note:    Date:  03/14/2022   ID:  Lindsay Guerrero, DOB [birth year], MRN [id number]  PCP:  Zoila Shutter, NP  Cardiologist:  Jenne Campus, MD    Referring MD: Zoila Shutter, NP   Chief Complaint  Patient presents with   Clearance TBD    Nasal Deviation Dr. Gaylyn Cheers     History of Present Illness:    Lindsay Guerrero is a 67 y.o. female  ith past medical history significant for aortic valve stenosis status post aortic valve replacement done in April 2021, cardiac catheterization that time showed nonobstructive coronary artery disease, additional problems include essential hypertension, paroxysmal atrial fibrillation.  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  Recently she developed another episode of atrial fibrillation required hospitalization, she eventually end up having cardioversion done.  With 200 J to first attempt.    She comes today to my office for follow-up.  Cardiac wise seems to be doing fine.  She so far got 2 cardioversions done for atrial fibrillation, she is anticoagulated.  She did see my partner Dr. Curt Bears and she is scheduled to have atrial fibrillation ablation in March.  Overall doing well.  Denies have any chest pain tightness squeezing pressure burning chest.  Interestingly she said sometimes she walks we have no problem sometimes when she will she get short of breath.  She blames this on paroxysmal of atrial fibrillation she is probably correct about this assessment.  She is scheduled to have ENT surgery) she does have deviated septum.  Past Medical History:  Diagnosis Date   Adult attention deficit disorder 05/15/2013   Anxiety state 11/19/2012   ICD10   Aortic stenosis 12/13/2017   Moderate by echocardiogram from 2019 mean gradient 25   Aortic stenosis, mild 07/13/2016   Moderate by echocardiogram from 2019 mean gradient 25   Aortic stenosis, severe 07/02/2019   Aortic valve sclerosis 05/24/2015   Formatting of this note might be  different from the original. Without stenosis. Last Echo 2012   Asthma    mild   Atrial fibrillation ([place]) 06/28/2018   Atrial fibrillation with RVR ([place]) 06/28/2018   CHF (congestive heart failure) (HCC)    Chronic bilateral low back pain 01/27/2015   Depression    Diabetes mellitus without complication (HCC)    borderline   Dyspnea    on exertion   Dyspnea on exertion 11/01/2017   Elective surgery for purposes other than treating health conditions 09/27/2010   ICD10   Essential hypertension 06/30/2018   Family history of colon cancer 03/01/2005   Family history of premature coronary artery disease 03/01/2005   GAD (generalized anxiety disorder) 03/01/2005   Formatting of this note might be different from the original. with panic attacks   Hepatitis    due to cytomegalovirus-resolved   History of breast cancer 06/30/2018   History of hiatal hernia    mild,found on a CT   History of malignant neoplasm of breast 03/01/2005   Formatting of this note might be different from the original. node neg; see chartnotes for treatment   HTN (hypertension), benign 07/13/2016   Hyperlipidemia 06/30/2018   Hypokalemia 06/30/2018   Inflamed seborrheic keratosis 08/08/2012   Irritable bowel syndrome 03/01/2005   Malignant neoplasm of upper-inner quadrant of female breast ([place]) 11/01/2017   Primary  diagnosis   Mild intermittent asthma without complication 22/48/2500   Formatting of this note might be different from the original. mild  Mitral regurgitation 12/13/2017   Mild to moderate based on echo from 2019   Mixed hyperlipidemia 07/20/2019   NICM (nonischemic cardiomyopathy) ([place]) 11/01/2015   Osteopenia 01/27/2015   Paroxysmal atrial fibrillation ([place]) 07/14/2019   Pneumonia 2018   Post menopausal problems 01/27/2015   Precordial pain 11/01/2015   Psoriatic arthritis ([place]) 04/25/2015   Recurrent genital HSV (herpes simplex virus) infection 03/01/2005   Formatting of this note might be different from  the original. HSV genital   Restless legs syndrome 03/01/2005   Rosacea 03/01/2005   Routine history and physical examination of adult 09/22/2012   Formatting of this note might be different from the original. PROBLEM LIST:  1. Left node negative breast cancer 2002, stage I, grade 1, status post  lumpectomy and node dissection, external beam radiation,  chemotherapy/Cytoxan, mild left arm lymphedema with cellulitis 2004.  2. Anxiety disorder with panic attacks, family history of same, globus  pharyngeus, palpitations, atypical chest pain, str   S/P aortic valve replacement with bioprosthetic valve 21 mm Edwards INSPIRIS RESILIA  07/02/2019   Size 21 mm   S/P AVR (aortic valve replacement) 07/20/2019   Seborrheic keratoses 09/10/2016   Sleep apnea    borderline   Vaginal dryness 01/27/2015    Past Surgical History:  Procedure Laterality Date   AORTIC VALVE REPLACEMENT N/A 07/02/2019   Procedure: AORTIC VALVE REPLACEMENT (AVR) USING INSPIRIS VALVE SIZE 21MM;  Surgeon: Gaye Pollack, MD;  Location: [facility];  Service: Open Heart Surgery;  Laterality: N/A;   BREAST LUMPECTOMY Left 2003   sentinal node bx.   CARDIOVERSION N/A 07/10/2019   Procedure: CARDIOVERSION;  Surgeon: Donato Heinz, MD;  Location: [facility] ENDOSCOPY;  Service: Cardiovascular;  Laterality: N/A;   CESAREAN SECTION     CLIPPING OF ATRIAL APPENDAGE N/A 07/02/2019   Procedure: CLIPPING OF ATRIAL APPENDAGE USING PRO2 CLIP SIZE 40MM;  Surgeon: Gaye Pollack, MD;  Location: [place];  Service: Open Heart Surgery;  Laterality: N/A;   COSMETIC SURGERY     IR THORACENTESIS ASP PLEURAL SPACE W/IMG GUIDE  07/07/2019   RIGHT/LEFT HEART CATH AND CORONARY ANGIOGRAPHY N/A 05/20/2019   Procedure: RIGHT/LEFT HEART CATH AND CORONARY ANGIOGRAPHY;  Surgeon: [name], MD;  Location: [facility] CV LAB;  Service: Cardiovascular;  Laterality: N/A;   TEE WITHOUT CARDIOVERSION N/A 07/02/2019   Procedure: TRANSESOPHAGEAL ECHOCARDIOGRAM (TEE);   Surgeon: Gaye Pollack, MD;  Location: [facility];  Service: Open Heart Surgery;  Laterality: N/A;   TONSILLECTOMY      Current Medications: Current Meds  Medication Sig   acebutolol (SECTRAL) 200 MG capsule Take 1 capsule (200 mg total) by mouth daily.   Acetylcysteine (NAC PO) Take 3 tablets by mouth daily. Unknown strength   albuterol (VENTOLIN HFA) 108 (90 Base) MCG/ACT inhaler Inhale 1 puff into the lungs every 4 (four) hours as needed for shortness of breath or wheezing.   amoxicillin (AMOXIL) 500 MG capsule TAKE 4 CAPSULES BY MOUTH ONE HOUR BEFORE DENTAL PROCEDURES (Patient taking differently: Take 2,000 mg by mouth See admin instructions. TAKE 4 CAPSULES BY MOUTH ONE HOUR BEFORE DENTAL PROCEDURES)   amphetamine-dextroamphetamine (ADDERALL) 20 MG tablet Take 10-20 mg by mouth daily as needed (ADD and/or hypersomnolence).    apixaban (ELIQUIS) 5 MG TABS tablet Take 5 mg by mouth 2 (two) times daily.   bimatoprost (LUMIGAN) 0.03 % ophthalmic solution Place 1 drop into both eyes at bedtime.   carvedilol (COREG) 3.125 MG tablet Take 3.125 mg by mouth 2 (  two) times daily.   furosemide (LASIX) 20 MG tablet Take 20 mg by mouth daily.   LORazepam (ATIVAN) 1 MG tablet Take 1 mg by mouth at bedtime as needed for anxiety or sleep.    ondansetron (ZOFRAN) 8 MG tablet Take 4-8 mg by mouth every 8 (eight) hours as needed for nausea or vomiting (nausea while traveling).   oxymetazoline (AFRIN) 0.05 % nasal spray Place 1 spray into both nostrils daily as needed for congestion (allergies.).   VITAMIN A PO Take 1 capsule by mouth 3 (three) times a week. Unknown strenght   Vitamin D, Ergocalciferol, (DRISDOL) 1.25 MG (50000 UNIT) CAPS capsule Take 50,000 Units by mouth every 7 (seven) days.   [DISCONTINUED] dronedarone (MULTAQ) 400 MG tablet Take 1 tablet (400 mg total) by mouth 2 (two) times daily with a meal.     Allergies:   Lisinopril, Iodine, and Metoprolol   Social History   Socioeconomic History    Marital status: Married    Spouse name: Not on file   Number of children: Not on file   Years of education: Not on file   Highest education level: Not on file  Occupational History   Not on file  Tobacco Use   Smoking status: Never   Smokeless tobacco: Never  Vaping Use   Vaping Use: Never used  Substance and Sexual Activity   Alcohol use: Yes    Comment: occasional social drinking (once per month)    Drug use: Never   Sexual activity: Yes    Partners: Male    Birth control/protection: None  Other Topics Concern   Not on file  Social History Narrative   Not on file   Social Determinants of Health   Financial Resource Strain: Low Risk  (06/29/2018)   Overall Financial Resource Strain (CARDIA)    Difficulty of Paying Living Expenses: Not hard at all  Food Insecurity: Not on file  Transportation Needs: Not on file  Physical Activity: Not on file  Stress: Not on file  Social Connections: Not on file     Family History: The patient's family history includes Heart Problems in her father and mother; Heart attack in her sister; Hypertension in her mother. ROS:   Please see the history of present illness.    All 14 point review of systems negative except as described per history of present illness  EKGs/Labs/Other Studies Reviewed:      Recent Labs: 01/10/2022: BUN 19; Creatinine, Ser 0.83; Magnesium 2.2; Potassium 4.6; Sodium 139  Recent Lipid Panel    Component Value Date/Time   CHOL 242 (H) 11/01/2017 1030   TRIG 175 (H) 11/01/2017 1030   HDL 47 11/01/2017 1030   CHOLHDL 5.1 (H) 11/01/2017 1030   LDLCALC 160 (H) 11/01/2017 1030   LDLDIRECT 166 (H) 09/15/2020 1326    Physical Exam:    VS:  BP 118/74 (BP Location: Left Arm, Patient Position: Sitting)   Pulse 78   Ht '5\' 2"'$  (1.575 m)   Wt 136 lb (61.7 kg)   LMP  (LMP Unknown)   SpO2 98%   BMI 24.87 kg/m     Wt Readings from Last 3 Encounters:  03/14/22 136 lb (61.7 kg)  01/04/22 135 lb (61.2 kg)   11/17/21 130 lb (59 kg)     GEN:  Well nourished, well developed in no acute distress HEENT: Normal NECK: No JVD; No carotid bruits LYMPHATICS: No lymphadenopathy CARDIAC: RRR, soft systolic murmur like always, no rubs, no gallops RESPIRATORY:  Clear to auscultation without rales, wheezing or rhonchi  ABDOMEN: Soft, non-tender, non-distended MUSCULOSKELETAL:  No edema; No deformity  SKIN: Warm and dry LOWER EXTREMITIES: no swelling NEUROLOGIC:  Alert and oriented x 3 PSYCHIATRIC:  Normal affect   ASSESSMENT:    1. Dyspnea on exertion   2. S/P aortic valve replacement with bioprosthetic valve 21 mm Edwards INSPIRIS RESILIA    3. Paroxysmal atrial fibrillation (HCC)   4. Essential hypertension    PLAN:    In order of problems listed above:  Cardiovascular evaluation before surgery.  Overall ENT surgery is considered low risk surgery, it is good to be done under general anesthesia but that she very easily can arrange 4 METS while exercising, therefore, I did not today be problem from cardiac standpoint even to pursue Surgical intervention.  She is on Eliquis but she tells me that she does not take Eliquis on the regular basis and not hardly pushing for it right now since she did have a appendage amputation.  She tells me she will take Eliquis when she got atrial fibrillation. Status post aortic valve replacement valve functioning properly last echocardiogram continue monitoring. Essential hypertension, blood pressure well-controlled. Dyslipidemia she is also reluctant to take any medications for days we had a multiple discussion about it.   Medication Adjustments/Labs and Tests Ordered: Current medicines are reviewed at length with the patient today.  Concerns regarding medicines are outlined above.  Orders Placed This Encounter  Procedures   Pro b natriuretic peptide (BNP)   Medication changes: No orders of the defined types were placed in this encounter.   Signed, Park Liter, MD, [facility] 03/14/2022 12:08 PM    [place]

## 2022-03-15 LAB — PRO B NATRIURETIC PEPTIDE: NT-Pro BNP: 393 pg/mL — ABNORMAL HIGH (ref 0–301)

## 2022-03-16 ENCOUNTER — Encounter: Payer: Self-pay | Admitting: Cardiology

## 2022-03-19 ENCOUNTER — Telehealth: Payer: Self-pay | Admitting: *Deleted

## 2022-03-19 NOTE — Telephone Encounter (Signed)
[facility] is calling to see if you have the medical clearance for patient. [phone] Ext. [phone] Lindsay Guerrero

## 2022-03-19 NOTE — Telephone Encounter (Signed)
Requesting office called to check on clearance. I do not see that we have received a clearance request yet. I did send a message to the nurse for Dr. Drue Dun to see if they got the request yet, but I have not heard back. I tried to call the requesting office but they are closed for lunch. I will have to call the office.

## 2022-03-20 NOTE — Telephone Encounter (Signed)
Spoke with [facility] receptionist with [facility] [phone] who will give fax number to [facility] [phone] to fax over surgical clearance for Patient

## 2022-03-21 ENCOUNTER — Telehealth: Payer: Self-pay

## 2022-03-21 DIAGNOSIS — I480 Paroxysmal atrial fibrillation: Secondary | ICD-10-CM

## 2022-03-21 NOTE — Telephone Encounter (Signed)
Results reviewed with pt as per Dr. Krasowski's note.  Pt verbalized understanding and had no additional questions. Routed to PCP  

## 2022-03-21 NOTE — Telephone Encounter (Signed)
   Pre-operative Risk Assessment    Patient Name: Lindsay Guerrero  DOB: 1955-09-30 MRN: [id number]      Request for Surgical Clearance    Procedure:   nasal septoplasty with SMR of Bilateral Inferior Turbinates  Date of Surgery:  Clearance TBD                                 Surgeon:  Dr. Cletis [place] Surgeon's Group or Practice Name:  [facility] Phone number:  [phone] Fax number:  [phone]   Type of Clearance Requested:   - Medical    Type of Anesthesia:  General    Additional requests/questions:    SignedLowella Grip   03/21/2022, 9:47 AM

## 2022-03-22 ENCOUNTER — Other Ambulatory Visit: Payer: Self-pay | Admitting: *Deleted

## 2022-03-22 DIAGNOSIS — I480 Paroxysmal atrial fibrillation: Secondary | ICD-10-CM

## 2022-03-22 LAB — CBC
Hematocrit: 37.9 % (ref 34.0–46.6)
Hemoglobin: 12.3 g/dL (ref 11.1–15.9)
MCH: 27.5 pg (ref 26.6–33.0)
MCHC: 32.5 g/dL (ref 31.5–35.7)
MCV: 85 fL (ref 79–97)
Platelets: 313 10*3/uL (ref 150–450)
RBC: 4.47 x10E6/uL (ref 3.77–5.28)
RDW: 13.3 % (ref 11.7–15.4)
WBC: 5.4 10*3/uL (ref 3.4–10.8)

## 2022-03-22 NOTE — Telephone Encounter (Signed)
She needs to have CBC for clearance regarding Eliquis hold per Pharm D. Patient was given medical clearance during office visit with Dr. Agustin Cree 03/14/2022. In his note, he reports that she does not take Eliquis on a daily basis, only if she has symptoms of a fib.

## 2022-03-22 NOTE — Telephone Encounter (Signed)
Patient with diagnosis of atrial fibrillation on Eliquis for anticoagulation.    Procedure:   nasal septoplasty with SMR of Bilateral Inferior Turbinates   Date of Surgery:  Clearance TBD      CHA2DS2-VASc Score = 5   This indicates a 7.2% annual risk of stroke. The patient's score is based upon: CHF History: 1 HTN History: 1 Diabetes History: 1 Stroke History: 0 Vascular Disease History: 0 Age Score: 1 Gender Score: 1    CrCl 63 Platelet count - last drawn 2021  - will need repeat lab for final clearance   **This guidance is not considered finalized until pre-operative APP has relayed final recommendations.**

## 2022-03-22 NOTE — Telephone Encounter (Signed)
I s/w the pt and she is agreeable to get CBC done in the [place] office. Pt will get lab today or tomorrow.

## 2022-03-23 NOTE — Telephone Encounter (Signed)
Patient with diagnosis of atrial fibriliation on Eliquis for anticoagulation.    Procedure: nasal septoplasty with SMR of Bilateral Inferior Turbinates  Date of procedure: TBD   CHA2DS2-VASc Score = 5   This indicates a 7.2% annual risk of stroke. The patient's score is based upon: CHF History: 1 HTN History: 1 Diabetes History: 1 Stroke History: 0 Vascular Disease History: 0 Age Score: 1 Gender Score: 1    CrCl 60 ml/min  Platelet count 313    Per office protocol, patient can hold Eliquis for 2 days prior to procedure.     **This guidance is not considered finalized until pre-operative APP has relayed final recommendations.**

## 2022-03-23 NOTE — Telephone Encounter (Signed)
   Primary Cardiologist: Jenne Campus, MD  Chart reviewed as part of pre-operative protocol coverage. Given past medical history and time since last visit, based on ACC/AHA guidelines, Lindsay Guerrero would be at acceptable risk for the planned procedure without further cardiovascular testing.   Patient was advised that if she develops new symptoms prior to surgery to contact our office to arrange a follow-up appointment.  Per office protocol, patient can hold Eliquis for 2 days prior to procedure.   I will route this recommendation to the requesting party via Epic fax function and remove from pre-op pool.  Please call with questions.  Emmaline Life, NP-C  03/23/2022, 11:57 AM [address] Office [phone] Fax [phone]

## 2022-04-13 ENCOUNTER — Other Ambulatory Visit: Payer: Self-pay | Admitting: *Deleted

## 2022-04-13 ENCOUNTER — Encounter: Payer: Self-pay | Admitting: *Deleted

## 2022-04-13 DIAGNOSIS — I480 Paroxysmal atrial fibrillation: Secondary | ICD-10-CM

## 2022-04-24 ENCOUNTER — Telehealth: Payer: Self-pay | Admitting: Cardiology

## 2022-04-24 NOTE — Telephone Encounter (Signed)
Patient c/o Palpitations:  High priority if patient c/o lightheadedness, shortness of breath, or chest pain  How long have you had palpitations/irregular HR/ Afib? Are you having the symptoms now? Went into Afib today, is actively having symptoms   Are you currently experiencing lightheadedness, SOB or CP? No   Do you have a history of afib (atrial fibrillation) or irregular heart rhythm? Yes   Have you checked your BP or HR? (document readings if available): HR ranging 135 on EKG   Are you experiencing any other symptoms? Anxious   Patient is wanting to know if she is still able to go through with her surgery scheduled for tomorrow due to actively being in afib.  Patient attempted to take BP while on call, but kept reading error. Transferring to triage nurse due to HR being STAT.

## 2022-04-24 NOTE — Telephone Encounter (Signed)
Called patient and she reported that she was having A-fib 135 - 140 beats per minute. She stated that she had taken a Cardizem 1 hour before and her a-fib was still in the 140's. She has no SOB or lower extremity edema. She is also scheduled to have a septoplasty in the AM. Spoke to Dr. Agustin Cree regarding the patient's symptoms and he stated that if she is still in A-fib after 3 hours, then take another Cardizem and if that does not work then go to the ER. I relayed Dr. Wendy Poet recommendation to the patient and she was agreeable with the plan and had no further questions at this time.

## 2022-04-26 ENCOUNTER — Encounter: Payer: Self-pay | Admitting: Cardiology

## 2022-04-30 ENCOUNTER — Ambulatory Visit: Payer: Managed Care, Other (non HMO) | Attending: Cardiology | Admitting: Cardiology

## 2022-04-30 ENCOUNTER — Encounter: Payer: Self-pay | Admitting: Cardiology

## 2022-04-30 VITALS — BP 132/78 | HR 68 | Ht 62.0 in | Wt 136.3 lb

## 2022-04-30 DIAGNOSIS — I480 Paroxysmal atrial fibrillation: Secondary | ICD-10-CM | POA: Diagnosis not present

## 2022-04-30 DIAGNOSIS — Z01812 Encounter for preprocedural laboratory examination: Secondary | ICD-10-CM

## 2022-04-30 DIAGNOSIS — D6869 Other thrombophilia: Secondary | ICD-10-CM

## 2022-04-30 MED ORDER — PREDNISONE 50 MG PO TABS: ORAL_TABLET | ORAL | 0 refills | Status: DC

## 2022-04-30 MED ORDER — APIXABAN 5 MG PO TABS: 5.0000 mg | ORAL_TABLET | Freq: Two times a day (BID) | ORAL | 4 refills | Status: DC

## 2022-04-30 NOTE — Progress Notes (Signed)
Electrophysiology Office Note   Date:  04/30/2022   ID:  Lindsay Guerrero, DOB [birth year], MRN [id number]  PCP:  Zoila Shutter, NP  Cardiologist:  Agustin Cree Primary Electrophysiologist:  Maximillian Habibi Meredith Leeds, MD    Chief Complaint: AF   History of Present Illness: Lindsay Guerrero is a 67 y.o. female who is being seen today for the evaluation of AF at the request of Zoila Shutter, NP. Presenting today for electrophysiology evaluation.  Significant for aortic stenosis post AVR April 2021, nonobstructive coronary artery disease, hypertension, paroxysmal atrial fibrillation.  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  She had an episode of atrial fibrillation requiring hospitalization and cardioversion.  She feels poorly in atrial fibrillation with weakness fatigue and shortness of breath.  She has plans for ablation 05/22/2022.  Today, denies symptoms of palpitations, chest pain, shortness of breath, orthopnea, PND, lower extremity edema, claudication, dizziness, presyncope, syncope, bleeding, or neurologic sequela. The patient is tolerating medications without difficulties.      Past Medical History:  Diagnosis Date   Adult attention deficit disorder 05/15/2013   Anxiety state 11/19/2012   ICD10   Aortic stenosis 12/13/2017   Moderate by echocardiogram from 2019 mean gradient 25   Aortic stenosis, mild 07/13/2016   Moderate by echocardiogram from 2019 mean gradient 25   Aortic stenosis, severe 07/02/2019   Aortic valve sclerosis 05/24/2015   Formatting of this note might be different from the original. Without stenosis. Last Echo 2012   Asthma    mild   Atrial fibrillation ([place]) 06/28/2018   Atrial fibrillation with RVR ([place]) 06/28/2018   CHF (congestive heart failure) (HCC)    Chronic bilateral low back pain 01/27/2015   Depression    Diabetes mellitus without complication (HCC)    borderline   Dyspnea    on exertion   Dyspnea on exertion 11/01/2017    Elective surgery for purposes other than treating health conditions 09/27/2010   ICD10   Essential hypertension 06/30/2018   Family history of colon cancer 03/01/2005   Family history of premature coronary artery disease 03/01/2005   GAD (generalized anxiety disorder) 03/01/2005   Formatting of this note might be different from the original. with panic attacks   Hepatitis    due to cytomegalovirus-resolved   History of breast cancer 06/30/2018   History of hiatal hernia    mild,found on a CT   History of malignant neoplasm of breast 03/01/2005   Formatting of this note might be different from the original. node neg; see chartnotes for treatment   HTN (hypertension), benign 07/13/2016   Hyperlipidemia 06/30/2018   Hypokalemia 06/30/2018   Inflamed seborrheic keratosis 08/08/2012   Irritable bowel syndrome 03/01/2005   Malignant neoplasm of upper-inner quadrant of female breast ([place]) 11/01/2017   Primary  diagnosis   Mild intermittent asthma without complication [id number]   Formatting of this note might be different from the original. mild   Mitral regurgitation 12/13/2017   Mild to moderate based on echo from 2019   Mixed hyperlipidemia 07/20/2019   NICM (nonischemic cardiomyopathy) ([place]) 11/01/2015   Osteopenia 01/27/2015   Paroxysmal atrial fibrillation ([place]) 07/14/2019   Pneumonia 2018   Post menopausal problems 01/27/2015   Precordial pain 11/01/2015   Psoriatic arthritis ([place]) 04/25/2015   Recurrent genital HSV (herpes simplex virus) infection 03/01/2005   Formatting of this note might be different from the original. HSV genital   Restless legs syndrome 03/01/2005   Rosacea  03/01/2005   Routine history and physical examination of adult 09/22/2012   Formatting of this note might be different from the original. PROBLEM LIST:  1. Left node negative breast cancer 2002, stage I, grade 1, status post  lumpectomy and node dissection, external beam radiation,  chemotherapy/Cytoxan, mild left arm  lymphedema with cellulitis 2004.  2. Anxiety disorder with panic attacks, family history of same, globus  pharyngeus, palpitations, atypical chest pain, str   S/P aortic valve replacement with bioprosthetic valve 21 mm Edwards INSPIRIS RESILIA  07/02/2019   Size 21 mm   S/P AVR (aortic valve replacement) 07/20/2019   Seborrheic keratoses 09/10/2016   Sleep apnea    borderline   Vaginal dryness 01/27/2015   Past Surgical History:  Procedure Laterality Date   AORTIC VALVE REPLACEMENT N/A 07/02/2019   Procedure: AORTIC VALVE REPLACEMENT (AVR) USING INSPIRIS VALVE SIZE 21MM;  Surgeon: Gaye Pollack, MD;  Location: [facility];  Service: Open Heart Surgery;  Laterality: N/A;   BREAST LUMPECTOMY Left 2003   sentinal node bx.   CARDIOVERSION N/A 07/10/2019   Procedure: CARDIOVERSION;  Surgeon: Donato Heinz, MD;  Location: [facility] ENDOSCOPY;  Service: Cardiovascular;  Laterality: N/A;   CESAREAN SECTION     CLIPPING OF ATRIAL APPENDAGE N/A 07/02/2019   Procedure: CLIPPING OF ATRIAL APPENDAGE USING PRO2 CLIP SIZE 40MM;  Surgeon: Gaye Pollack, MD;  Location: [facility];  Service: Open Heart Surgery;  Laterality: N/A;   COSMETIC SURGERY     IR THORACENTESIS ASP PLEURAL SPACE W/IMG GUIDE  07/07/2019   RIGHT/LEFT HEART CATH AND CORONARY ANGIOGRAPHY N/A 05/20/2019   Procedure: RIGHT/LEFT HEART CATH AND CORONARY ANGIOGRAPHY;  Surgeon: [name], MD;  Location: [facility];  Service: Cardiovascular;  Laterality: N/A;   TEE WITHOUT CARDIOVERSION N/A 07/02/2019   Procedure: TRANSESOPHAGEAL ECHOCARDIOGRAM (TEE);  Surgeon: Gaye Pollack, MD;  Location: [facility];  Service: Open Heart Surgery;  Laterality: N/A;   TONSILLECTOMY       Current Outpatient Medications  Medication Sig Dispense Refill   acebutolol (SECTRAL) 200 MG capsule Take 1 capsule (200 mg total) by mouth daily. 90 capsule 3   Acetylcysteine (NAC PO) Take 3 tablets by mouth daily. Unknown strength     albuterol (VENTOLIN HFA) 108 (90  Base) MCG/ACT inhaler Inhale 1 puff into the lungs every 4 (four) hours as needed for shortness of breath or wheezing.     amoxicillin (AMOXIL) 500 MG capsule TAKE 4 CAPSULES BY MOUTH ONE HOUR BEFORE DENTAL PROCEDURES (Patient taking differently: Take 2,000 mg by mouth See admin instructions. TAKE 4 CAPSULES BY MOUTH ONE HOUR BEFORE DENTAL PROCEDURES) 4 capsule 5   amphetamine-dextroamphetamine (ADDERALL) 20 MG tablet Take 10-20 mg by mouth daily as needed (ADD and/or hypersomnolence).      apixaban (ELIQUIS) 5 MG TABS tablet Take 5 mg by mouth 2 (two) times daily.     bimatoprost (LUMIGAN) 0.03 % ophthalmic solution Place 1 drop into both eyes at bedtime.     furosemide (LASIX) 20 MG tablet Take 20 mg by mouth daily.     LORazepam (ATIVAN) 1 MG tablet Take 1 mg by mouth at bedtime as needed for anxiety or sleep.      ondansetron (ZOFRAN) 8 MG tablet Take 4-8 mg by mouth every 8 (eight) hours as needed for nausea or vomiting (nausea while traveling).     oxymetazoline (AFRIN) 0.05 % nasal spray Place 1 spray into both nostrils daily as needed for congestion (allergies.).  VITAMIN A PO Take 1 capsule by mouth 3 (three) times a week. Unknown strenght     Vitamin D, Ergocalciferol, (DRISDOL) 1.25 MG (50000 UNIT) CAPS capsule Take 50,000 Units by mouth every 7 (seven) days.     No current facility-administered medications for this visit.    Allergies:   Lisinopril, Iodine, and Metoprolol   Social History:  The patient  reports that she has never smoked. She has never used smokeless tobacco. She reports current alcohol use. She reports that she does not use drugs.   Family History:  The patient's family history includes Heart Problems in her father and mother; Heart attack in her sister; Hypertension in her mother.   ROS:  Please see the history of present illness.   Otherwise, review of systems is positive for none.   All other systems are reviewed and negative.   PHYSICAL EXAM: VS:  BP 132/78    Pulse 68   Ht 5' 2"$  (1.575 m)   Wt 136 lb 4.8 oz (61.8 kg)   LMP  (LMP Unknown)   SpO2 98%   BMI 24.93 kg/m  , BMI Body mass index is 24.93 kg/m. GEN: Well nourished, well developed, in no acute distress  HEENT: normal  Neck: no JVD, carotid bruits, or masses Cardiac: RRR; no murmurs, rubs, or gallops,no edema  Respiratory:  clear to auscultation bilaterally, normal work of breathing GI: soft, nontender, nondistended, + BS MS: no deformity or atrophy  Skin: warm and dry Neuro:  Strength and sensation are intact Psych: euthymic mood, full affect  EKG:  EKG is ordered today. Personal review of the ekg ordered shows sinus rhythm   Recent Labs: 01/10/2022: BUN 19; Creatinine, Ser 0.83; Magnesium 2.2; Potassium 4.6; Sodium 139 03/14/2022: NT-Pro BNP 393 03/22/2022: Hemoglobin 12.3; Platelets 313    Lipid Panel     Component Value Date/Time   CHOL 242 (H) 11/01/2017 1030   TRIG 175 (H) 11/01/2017 1030   HDL 47 11/01/2017 1030   CHOLHDL 5.1 (H) 11/01/2017 1030   LDLCALC 160 (H) 11/01/2017 1030   LDLDIRECT 166 (H) 09/15/2020 1326     Wt Readings from Last 3 Encounters:  04/30/22 136 lb 4.8 oz (61.8 kg)  03/14/22 136 lb (61.7 kg)  01/04/22 135 lb (61.2 kg)      Other studies Reviewed: Additional studies/ records that were reviewed today include: TTE 11/10/2021 Review of the above records today demonstrates:   1. Left ventricular ejection fraction, by estimation, is 50 to 55%. The  left ventricle has low normal function. The left ventricle has no regional  wall motion abnormalities. Left ventricular diastolic parameters were  normal.   2. Right ventricular systolic function is normal. The right ventricular  size is normal.   3. The mitral valve is normal in structure. Mild mitral valve  regurgitation. No evidence of mitral stenosis.   4. There is a 21 mm Edwards Inspiris valve present in the aortic  position. Procedure Date: 07/02/2019. Echo findings are consistent with   normal structure and function of the aortic valve prosthesis.   5. The inferior vena cava is normal in size with greater than 50%  respiratory variability, suggesting right atrial pressure of 3 mmHg.    ASSESSMENT AND PLAN:  1.  Persistent atrial fibrillation: Currently on Eliquis. CHA2DS2-VASc of 3.  Is scheduled for ablation 05/22/2022.  She currently feels well with minimal episodes of atrial fibrillation.  Ready for ablation.  All questions were answered today.  2.  Aortic stenosis: Post AVR.  Stable on most recent echo.  Plan per primary cardiology.  3.  Hypertension: Currently well-controlled  4.  Second hypercoagulable state: Currently on Eliquis for atrial fibrillation as above   Current medicines are reviewed at length with the patient today.   The patient does not have concerns regarding her medicines.  The following changes were made today: None  Labs/ tests ordered today include:  Orders Placed This Encounter  Procedures   Basic metabolic panel   CBC   EKG 12-Lead     Disposition:   FU 3 months  Signed, Gilverto Dileonardo Meredith Leeds, MD  04/30/2022 12:03 PM     [facility] [address] [place] [address] [phone] (office) [phone] (fax)

## 2022-04-30 NOTE — Patient Instructions (Signed)
Keep your post ablation follow up appointments

## 2022-05-01 LAB — BASIC METABOLIC PANEL
BUN/Creatinine Ratio: 18 (ref 12–28)
BUN: 14 mg/dL (ref 8–27)
CO2: 26 mmol/L (ref 20–29)
Calcium: 9.7 mg/dL (ref 8.7–10.3)
Chloride: 99 mmol/L (ref 96–106)
Creatinine, Ser: 0.77 mg/dL (ref 0.57–1.00)
Glucose: 135 mg/dL — ABNORMAL HIGH (ref 70–99)
Potassium: 4.4 mmol/L (ref 3.5–5.2)
Sodium: 141 mmol/L (ref 134–144)
eGFR: 84 mL/min/{1.73_m2} (ref >59)

## 2022-05-01 LAB — CBC
Hematocrit: 40.4 % (ref 34.0–46.6)
Hemoglobin: 13.3 g/dL (ref 11.1–15.9)
MCH: 28.5 pg (ref 26.6–33.0)
MCHC: 32.9 g/dL (ref 31.5–35.7)
MCV: 87 fL (ref 79–97)
Platelets: 340 10*3/uL (ref 150–450)
RBC: 4.66 x10E6/uL (ref 3.77–5.28)
RDW: 13.6 % (ref 11.7–15.4)
WBC: 8 10*3/uL (ref 3.4–10.8)

## 2022-05-14 ENCOUNTER — Telehealth (HOSPITAL_COMMUNITY): Payer: Self-pay | Admitting: Emergency Medicine

## 2022-05-14 ENCOUNTER — Telehealth: Payer: Self-pay

## 2022-05-14 NOTE — Telephone Encounter (Signed)
Patient is returning call. Requesting return call.

## 2022-05-14 NOTE — Telephone Encounter (Signed)
Pt left message with [facility] that she needed to cancel her CT and Afib Ablation due to a bowel obstruction.   I LM for pt to call me back so I can get her rescheduled.

## 2022-05-14 NOTE — Telephone Encounter (Signed)
I spoke with pt, she has a lot of questions regarding what's going on with her now and taking the blood thinner. She would like to speak with Sherri (Dr. Curt Bears RN) before her CT tomorrow to discuss this so she can make a decision.

## 2022-05-14 NOTE — Telephone Encounter (Signed)
Followed up with pt. Recent bowel ubstruction, diagnosed this past Saturday. Aware will need to post pone scheduled ablation for next week.  Aware I will call her later to get ablation date rescheduled. Patient verbalized understanding and agreeable to plan.

## 2022-05-14 NOTE — Telephone Encounter (Signed)
Pt may cancel due to other process. Will follow up with EP office. Marchia Bond RN Navigator [facility] [phone] Office  [phone] Cell

## 2022-05-15 ENCOUNTER — Ambulatory Visit (HOSPITAL_COMMUNITY): Payer: Managed Care, Other (non HMO)

## 2022-05-18 ENCOUNTER — Telehealth: Payer: Self-pay | Admitting: Cardiology

## 2022-05-18 DIAGNOSIS — I480 Paroxysmal atrial fibrillation: Secondary | ICD-10-CM

## 2022-05-18 NOTE — Telephone Encounter (Signed)
Pt is calling to reschedule her Ablation

## 2022-05-22 ENCOUNTER — Telehealth: Payer: Self-pay | Admitting: Cardiology

## 2022-05-22 ENCOUNTER — Ambulatory Visit (HOSPITAL_COMMUNITY)
Admission: RE | Admit: 2022-05-22 | Payer: Managed Care, Other (non HMO) | Source: Ambulatory Visit | Admitting: Cardiology

## 2022-05-22 ENCOUNTER — Encounter (HOSPITAL_COMMUNITY): Admission: RE | Payer: Self-pay | Source: Ambulatory Visit

## 2022-05-22 ENCOUNTER — Encounter: Payer: Self-pay | Admitting: Cardiology

## 2022-05-22 SURGERY — ATRIAL FIBRILLATION ABLATION
Anesthesia: General

## 2022-05-22 NOTE — Telephone Encounter (Signed)
*  STAT* If patient is at the pharmacy, call can be transferred to refill team.   1. Which medications need to be refilled? (please list name of each medication and dose if known) carvedilol - not on her med list??  2. Which pharmacy/location (including street and city if local pharmacy) is medication to be sent to?  [facility] [place], [place]   3. Do they need a 30 day or 90 day supply? 30 day   Pt states she takes this med PRN for A-Fib RVR. She took the last one this morning.

## 2022-05-22 NOTE — Telephone Encounter (Signed)
This medication was reported as d/c on 04/30/22 by Thressa Sheller. I called and LM to call back.

## 2022-05-23 MED ORDER — CARVEDILOL 3.125 MG PO TABS: 3.1250 mg | ORAL_TABLET | Freq: Two times a day (BID) | ORAL | 2 refills | Status: DC

## 2022-05-23 MED ORDER — DILTIAZEM HCL 30 MG PO TABS: 30.0000 mg | ORAL_TABLET | Freq: Three times a day (TID) | ORAL | 1 refills | Status: DC | PRN

## 2022-05-23 NOTE — Addendum Note (Signed)
Addended by: Truddie Hidden on: 05/23/2022 12:33 PM   Modules accepted: Orders

## 2022-05-24 NOTE — Telephone Encounter (Signed)
Pt scheduled for 4/25..  Lab appt: 4/5.[name]  Pt aware that I will send updated Instruction letters via [place]

## 2022-05-28 ENCOUNTER — Encounter (HOSPITAL_BASED_OUTPATIENT_CLINIC_OR_DEPARTMENT_OTHER): Payer: Self-pay | Admitting: Family

## 2022-05-28 ENCOUNTER — Ambulatory Visit (INDEPENDENT_AMBULATORY_CARE_PROVIDER_SITE_OTHER): Payer: Managed Care, Other (non HMO) | Admitting: Family

## 2022-05-28 VITALS — BP 122/62 | HR 86 | Ht 62.0 in | Wt 134.0 lb

## 2022-05-28 DIAGNOSIS — I480 Paroxysmal atrial fibrillation: Secondary | ICD-10-CM | POA: Diagnosis not present

## 2022-05-28 DIAGNOSIS — I10 Essential (primary) hypertension: Secondary | ICD-10-CM | POA: Diagnosis not present

## 2022-05-28 DIAGNOSIS — D6859 Other primary thrombophilia: Secondary | ICD-10-CM | POA: Diagnosis not present

## 2022-05-28 DIAGNOSIS — Z953 Presence of xenogenic heart valve: Secondary | ICD-10-CM | POA: Diagnosis not present

## 2022-05-28 LAB — CBC
Hematocrit: 42 % (ref 34.0–46.6)
Hemoglobin: 13.1 g/dL (ref 11.1–15.9)
MCH: 28.2 pg (ref 26.6–33.0)
MCHC: 31.2 g/dL — ABNORMAL LOW (ref 31.5–35.7)
MCV: 90 fL (ref 79–97)
Platelets: 362 x10E3/uL (ref 150–450)
RBC: 4.65 x10E6/uL (ref 3.77–5.28)
RDW: 14.5 % (ref 11.7–15.4)
WBC: 6.1 x10E3/uL (ref 3.4–10.8)

## 2022-05-28 LAB — BASIC METABOLIC PANEL WITH GFR
BUN/Creatinine Ratio: 29 — ABNORMAL HIGH (ref 12–28)
BUN: 23 mg/dL (ref 8–27)
CO2: 24 mmol/L (ref 20–29)
Calcium: 9.6 mg/dL (ref 8.7–10.3)
Chloride: 100 mmol/L (ref 96–106)
Creatinine, Ser: 0.79 mg/dL (ref 0.57–1.00)
Glucose: 132 mg/dL — ABNORMAL HIGH (ref 70–99)
Potassium: 4.6 mmol/L (ref 3.5–5.2)
Sodium: 139 mmol/L (ref 134–144)
eGFR: 81 mL/min/1.73

## 2022-05-28 MED ORDER — DILTIAZEM HCL ER COATED BEADS 120 MG PO CP24: 120.0000 mg | ORAL_CAPSULE | Freq: Every day | ORAL | 6 refills | Status: DC

## 2022-05-28 NOTE — Patient Instructions (Signed)
Medication Instructions:  Your physician has recommended you make the following change in your medication:   Stop: Carvedilol   Start: Diltiazem 120mg  daily until back in rhythm   *If you need a refill on your cardiac medications before your next appointment, please call your pharmacy*   Lab Work: Your physician recommends that you return for lab work today- BMP, CBC   Testing/Procedures: You will be scheduled for Cardioversion. [facility] will need to precert the procedure and they will contact you with the date and time!  DIET:  Nothing to eat or drink after midnight except a sip of water with medications (see medication instructions below)  MEDICATION INSTRUCTIONS: Continue taking your anticoagulant (blood thinner): Apixaban (Eliquis).  You will need to continue this after your procedure until you are told by your provider that it is safe to stop.    LABS: Done at [facility] 3/18  FYI:  For your safety, and to allow [place] to monitor your vital signs accurately during the surgery/procedure we request: If you have artificial nails, gel coating, SNS etc, please have those removed prior to your surgery/procedure. Not having the nail coverings /polish removed may result in cancellation or delay of your surgery/procedure.  You must have a responsible person to drive you home and stay in the waiting area during your procedure. Failure to do so could result in cancellation.  Bring your insurance cards.  *Special Note: Every effort is made to have your procedure done on time. Occasionally there are emergencies that occur at the hospital that may cause delays. Please be patient if a delay does occur.   Follow-Up: At [facility], you and your health needs are our priority.  As part of our continuing mission to provide you with exceptional heart care, we have created designated Provider Care Teams.  These Care Teams include your primary Cardiologist (physician) and Advanced Practice  Providers (APPs -  Physician Assistants and Nurse Practitioners) who all work together to provide you with the care you need, when you need it.  We recommend signing up for the patient portal called "MyChart".  Sign up information is provided on this After Visit Summary.  MyChart is used to connect with patients for Virtual Visits (Telemedicine).  Patients are able to view lab/test results, encounter notes, upcoming appointments, etc.  Non-urgent messages can be sent to your provider as well.   To learn more about what you can do with MyChart, go to [email or web address].    Your next appointment:   2-3 week(s) post cardioversion   Provider:   Jenne Campus, MD

## 2022-05-28 NOTE — Progress Notes (Signed)
Office Visit    Patient Name: Lindsay Guerrero Date of Encounter: 05/28/2022  PCP:  Zoila Shutter, NP   [facility]  Cardiologist:  Jenne Campus, MD  Advanced Practice Provider:  No care team member to display Electrophysiologist:  Will Meredith Leeds, MD     Chief Complaint    Lindsay Guerrero is a 67 y.o. female presents today for recurrent atrial fibrillation   Past Medical History    Past Medical History:  Diagnosis Date   Adult attention deficit disorder 05/15/2013   Anxiety state 11/19/2012   ICD10   Aortic stenosis 12/13/2017   Moderate by echocardiogram from 2019 mean gradient 25   Aortic stenosis, mild 07/13/2016   Moderate by echocardiogram from 2019 mean gradient 25   Aortic stenosis, severe 07/02/2019   Aortic valve sclerosis 05/24/2015   Formatting of this note might be different from the original. Without stenosis. Last Echo 2012   Asthma    mild   Atrial fibrillation ([place]) 06/28/2018   Atrial fibrillation with RVR ([place]) 06/28/2018   CHF (congestive heart failure) (HCC)    Chronic bilateral low back pain 01/27/2015   Depression    Diabetes mellitus without complication (HCC)    borderline   Dyspnea    on exertion   Dyspnea on exertion 11/01/2017   Elective surgery for purposes other than treating health conditions 09/27/2010   ICD10   Essential hypertension 06/30/2018   Family history of colon cancer 03/01/2005   Family history of premature coronary artery disease 03/01/2005   GAD (generalized anxiety disorder) 03/01/2005   Formatting of this note might be different from the original. with panic attacks   Hepatitis    due to cytomegalovirus-resolved   History of breast cancer 06/30/2018   History of hiatal hernia    mild,found on a CT   History of malignant neoplasm of breast 03/01/2005   Formatting of this note might be different from the original. node neg; see chartnotes for treatment   HTN (hypertension), benign 07/13/2016    Hyperlipidemia 06/30/2018   Hypokalemia 06/30/2018   Inflamed seborrheic keratosis 08/08/2012   Irritable bowel syndrome 03/01/2005   Malignant neoplasm of upper-inner quadrant of female breast ([place]) 11/01/2017   Primary  diagnosis   Mild intermittent asthma without complication [id number]   Formatting of this note might be different from the original. mild   Mitral regurgitation 12/13/2017   Mild to moderate based on echo from 2019   Mixed hyperlipidemia 07/20/2019   NICM (nonischemic cardiomyopathy) ([place]) 11/01/2015   Osteopenia 01/27/2015   Paroxysmal atrial fibrillation ([place]) 07/14/2019   Pneumonia 2018   Post menopausal problems 01/27/2015   Precordial pain 11/01/2015   Psoriatic arthritis ([place]) 04/25/2015   Recurrent genital HSV (herpes simplex virus) infection 03/01/2005   Formatting of this note might be different from the original. HSV genital   Restless legs syndrome 03/01/2005   Rosacea 03/01/2005   Routine history and physical examination of adult 09/22/2012   Formatting of this note might be different from the original. PROBLEM LIST:  1. Left node negative breast cancer 2002, stage I, grade 1, status post  lumpectomy and node dissection, external beam radiation,  chemotherapy/Cytoxan, mild left arm lymphedema with cellulitis 2004.  2. Anxiety disorder with panic attacks, family history of same, globus  pharyngeus, palpitations, atypical chest pain, str   S/P aortic valve replacement with bioprosthetic valve 21 mm Edwards INSPIRIS RESILIA  07/02/2019   Size 21 mm  S/P AVR (aortic valve replacement) 07/20/2019   Seborrheic keratoses 09/10/2016   Sleep apnea    borderline   Vaginal dryness 01/27/2015   Past Surgical History:  Procedure Laterality Date   AORTIC VALVE REPLACEMENT N/A 07/02/2019   Procedure: AORTIC VALVE REPLACEMENT (AVR) USING INSPIRIS VALVE SIZE 21MM;  Surgeon: Gaye Pollack, MD;  Location: [facility];  Service: Open Heart Surgery;  Laterality: N/A;   BREAST LUMPECTOMY Left  2003   sentinal node bx.   CARDIOVERSION N/A 07/10/2019   Procedure: CARDIOVERSION;  Surgeon: Donato Heinz, MD;  Location: [facility] ENDOSCOPY;  Service: Cardiovascular;  Laterality: N/A;   CESAREAN SECTION     CLIPPING OF ATRIAL APPENDAGE N/A 07/02/2019   Procedure: CLIPPING OF ATRIAL APPENDAGE USING PRO2 CLIP SIZE 40MM;  Surgeon: Gaye Pollack, MD;  Location: [facility];  Service: Open Heart Surgery;  Laterality: N/A;   COSMETIC SURGERY     IR THORACENTESIS ASP PLEURAL SPACE W/IMG GUIDE  07/07/2019   RIGHT/LEFT HEART CATH AND CORONARY ANGIOGRAPHY N/A 05/20/2019   Procedure: RIGHT/LEFT HEART CATH AND CORONARY ANGIOGRAPHY;  Surgeon: [name], MD;  Location: [facility];  Service: Cardiovascular;  Laterality: N/A;   TEE WITHOUT CARDIOVERSION N/A 07/02/2019   Procedure: TRANSESOPHAGEAL ECHOCARDIOGRAM (TEE);  Surgeon: Gaye Pollack, MD;  Location: [facility];  Service: Open Heart Surgery;  Laterality: N/A;   TONSILLECTOMY      Allergies  Allergies  Allergen Reactions   Lisinopril Cough   Metoprolol Rash and Other (See Comments)    History of Present Illness    Lindsay Guerrero is a 67 y.o. female with a hx of atrial fibrillation, aortic stenosis s/p AVR 06/2019, nonobstructive coronary artery disease (luminal irregularities by [facility] 2021), hypertension last seen 04/30/2022 Dr. Curt Bears.  She has upcoming atrial fibrillation ablation 07/05/22 with Dr. Curt Bears.   She presents today for follow up independently. Note she has been back in atrial fibrillation for about a week. Notes she might have been in and out of atrial fibrillation prior to that time. Does report recent stressors with bowel obstruction (treated with abx and fluids). Now having BM every morning but reports still not quite normal. Has been taking her Diltiazem regularly without restoration of NSR. She is taking Lasix 20mg  daily and notes her PCP started [place]. She previously took it and felt as if she urinated frequently. We  discussed cardioprotective benefit of Farxiga and that if she started again could potentially switch Lasix to PRN. Reports no missed doses of Eliquis - did have LAA clipping at time of AVR.  EKGs/Labs/Other Studies Reviewed:   The following studies were reviewed today: Cardiac Studies & Procedures   CARDIAC CATHETERIZATION  CARDIAC CATHETERIZATION 05/20/2019  Narrative 1. Patent coronary arteries with minimal nonobstructive CAD, luminal irregularity (left dominant) 2. Moderate aortic stenosis with mean gradient 26 mmHg and AVA 1.11 square cm 3. Normal right heart pressures  Discussed findings with patient, reviewed echo and symptoms (both suggest symptomatic severe AS). Recommend continued evaluation with CTA studies and cardiac surgery referral. Consider TAVR versus conventional AVR in this low risk patient.  Findings Coronary Findings Diagnostic  Dominance: Left  Left Anterior Descending The vessel exhibits minimal luminal irregularities.  Left Circumflex The vessel exhibits minimal luminal irregularities.  Right Coronary Artery Vessel is angiographically normal.  Intervention  No interventions have been documented.     ECHOCARDIOGRAM  ECHOCARDIOGRAM COMPLETE 11/10/2021  Narrative ECHOCARDIOGRAM REPORT    Patient Name:   Lindsay Guerrero Date  of Exam: 11/10/2021 Medical Rec #:  [id number]       Height:       62.0 in Accession #:    [id number]      Weight:       127.0 lb Date of Birth:  [birth year]       BSA:          1.576 m Patient Age:    43 years        BP:           126/74 mmHg Patient Gender: F               HR:           80 bpm. Exam Location:  [place]  Procedure: 2D Echo, Cardiac Doppler, Color Doppler and Strain Analysis  Indications:    Dyspnea on exertion [R06.09 (ICD-10-CM)]  History:        Patient has prior history of Echocardiogram examinations, most recent 10/04/2020. Cardiomyopathy and CHF, TIA, Aortic Valve Disease, Arrythmias:Atrial Fibrillation;  Risk Factors:Hypertension and Dyslipidemia. Aortic Valve: 21 mm Edwards Inspiris valve is present in the aortic position. Procedure Date: 07/02/2019.  Sonographer:    Philipp Deputy RDCS Referring Phys: [id number] [place]   1. Left ventricular ejection fraction, by estimation, is 50 to 55%. The left ventricle has low normal function. The left ventricle has no regional wall motion abnormalities. Left ventricular diastolic parameters were normal. 2. Right ventricular systolic function is normal. The right ventricular size is normal. 3. The mitral valve is normal in structure. Mild mitral valve regurgitation. No evidence of mitral stenosis. 4. There is a 21 mm Edwards Inspiris valve present in the aortic position. Procedure Date: 07/02/2019. Echo findings are consistent with normal structure and function of the aortic valve prosthesis. 5. The inferior vena cava is normal in size with greater than 50% respiratory variability, suggesting right atrial pressure of 3 mmHg.  FINDINGS Left Ventricle: Left ventricular ejection fraction, by estimation, is 50 to 55%. The left ventricle has low normal function. The left ventricle has no regional wall motion abnormalities. The left ventricular internal cavity size was normal in size. There is no left ventricular hypertrophy. Left ventricular diastolic parameters were normal. Normal left ventricular filling pressure.  Right Ventricle: The right ventricular size is normal. No increase in right ventricular wall thickness. Right ventricular systolic function is normal.  Left Atrium: Left atrial size was normal in size.  Right Atrium: Right atrial size was normal in size.  Pericardium: There is no evidence of pericardial effusion.  Mitral Valve: The mitral valve is normal in structure. Mild mitral valve regurgitation. No evidence of mitral valve stenosis.  Tricuspid Valve: The tricuspid valve is normal in structure. Tricuspid valve  regurgitation is mild . No evidence of tricuspid stenosis.  Aortic Valve: Aortic valve mean gradient measures 8.5 mmHg. Aortic valve peak gradient measures 14.4 mmHg. Aortic valve area, by VTI measures 1.29 cm. There is a 21 mm Edwards Inspiris valve present in the aortic position. Procedure Date: 07/02/2019. Echo findings are consistent with normal structure and function of the aortic valve prosthesis.  Pulmonic Valve: The pulmonic valve was normal in structure. Pulmonic valve regurgitation is not visualized. No evidence of pulmonic stenosis.  Aorta: The aortic root and ascending aorta are structurally normal, with no evidence of dilitation and the aortic arch was not well visualized.  Venous: The pulmonary veins were not well visualized. The inferior vena cava is normal in size with greater  than 50% respiratory variability, suggesting right atrial pressure of 3 mmHg.  IAS/Shunts: No atrial level shunt detected by color flow Doppler.   LEFT VENTRICLE PLAX 2D LVIDd:         3.90 cm   Diastology LVIDs:         2.90 cm   LV e' medial:    8.59 cm/s LV PW:         0.80 cm   LV E/e' medial:  10.4 LV IVS:        1.10 cm   LV e' lateral:   9.68 cm/s LVOT diam:     1.90 cm   LV E/e' lateral: 9.2 LV SV:         49 LV SV Index:   31 LVOT Area:     2.84 cm   RIGHT VENTRICLE            IVC RV Basal diam:  2.40 cm    IVC diam: 1.60 cm RV S prime:     8.05 cm/s TAPSE (M-mode): 1.5 cm  LEFT ATRIUM             Index        RIGHT ATRIUM           Index LA diam:        4.00 cm 2.54 cm/m   RA Area:     12.20 cm LA Vol (A2C):   34.8 ml 22.08 ml/m  RA Volume:   25.30 ml  16.05 ml/m LA Vol (A4C):   38.3 ml 24.30 ml/m LA Biplane Vol: 37.8 ml 23.98 ml/m AORTIC VALVE AV Area (Vmax):    1.26 cm AV Area (Vmean):   1.36 cm AV Area (VTI):     1.29 cm AV Vmax:           190.00 cm/s AV Vmean:          136.000 cm/s AV VTI:            0.378 m AV Peak Grad:      14.4 mmHg AV Mean Grad:      8.5  mmHg LVOT Vmax:         84.20 cm/s LVOT Vmean:        65.300 cm/s LVOT VTI:          0.172 m LVOT/AV VTI ratio: 0.46  AORTA Ao Root diam: 2.20 cm Ao Asc diam:  3.00 cm Ao Desc diam: 2.10 cm  MITRAL VALVE               TRICUSPID VALVE MV Area (PHT): 5.84 cm    TR Peak grad:   17.6 mmHg MV Decel Time: 130 msec    TR Vmax:        210.00 cm/s MV E velocity: 89.50 cm/s MV A velocity: 81.40 cm/s  SHUNTS MV E/A ratio:  1.10        Systemic VTI:  0.17 m Systemic Diam: 1.90 cm  Shirlee More MD Electronically signed by Shirlee More MD Signature Date/Time: 11/10/2021/12:57:49 PM    Final   TEE  ECHO INTRAOPERATIVE TEE 07/07/2019  Narrative *INTRAOPERATIVE TRANSESOPHAGEAL REPORT *    Patient Name:   Lindsay Guerrero Date of Exam: 07/02/2019 Medical Rec #:  [id number]       Height:       62.0 in Accession #:    [id number]      Weight:       140.7 lb Date of Birth:  March 07, 1956       BSA:          1.65 m Patient Age:    63 years        BP:           121/60 mmHg Patient Gender: F               HR:           69 bpm. Exam Location:  Inpatient  Transesophogeal exam was perform intraoperatively during surgical procedure. Patient was closely monitored under general anesthesia during the entirety of examination.  Indications:     Aortic valve stenosis Sonographer:     Vikki Ports T[place] Performing Phys: [place] Diagnosing Phys: Suella Broad MD  Complications: No known complications during this procedure. POST-OP IMPRESSIONS - Left Ventricle: The left ventricle is unchanged from pre-bypass. - Aorta: The aorta appears unchanged from pre-bypass. - Left Atrial Appendage: A Pro 2 Clip was placed. - Aortic Valve: No stenosis present. A bioprosthetic valve was placed, leaflets are not freely mobile Manufactured by; [employer] Size; 52mm. There is no regurgitation. The gradient recorded across the prosthetic valve is within the expected range. No perivalvular leak noted. - Mitral  Valve: The mitral valve appears unchanged from pre-bypass. - Tricuspid Valve: The tricuspid valve appears unchanged from pre-bypass. - Pericardium: The pericardium appears unchanged from pre-bypass.  PRE-OP FINDINGS Left Ventricle: The left ventricle has normal systolic function, with an ejection fraction of 60-65%. The cavity size was normal. There is no increase in left ventricular wall thickness. No evidence of left ventricular regional wall motion abnormalities.  Right Ventricle: The right ventricle has normal systolic function. The cavity was normal. There is no increase in right ventricular wall thickness.  Left Atrium: Left atrial size was normal in size. The left atrial appendage is well visualized and there is no evidence of thrombus present. Left atrial appendage velocity is normal at greater than 40 cm/s.  Right Atrium: Right atrial size was normal in size.  Interatrial Septum: No atrial level shunt detected by color flow Doppler.  Pericardium: There is no evidence of pericardial effusion. There is no evidence of cardiac tamponade.  Mitral Valve: The mitral valve is normal in structure. No thickening of the mitral valve leaflet. No calcification of the mitral valve leaflet. Mitral valve regurgitation is trivial by color flow Doppler. The MR jet is centrally-directed. There is no evidence of mitral valve vegetation.  Tricuspid Valve: The tricuspid valve was normal in structure. Tricuspid valve regurgitation is mild by color flow Doppler. The jet is directed centrally. The tricuspid valve is mildly thickened. There is no evidence of tricuspid valve vegetation.  Aortic Valve: The aortic valve is tricuspid There is moderate thickening of the aortic valve and There is severe calcifcation of the aortic valve Aortic valve regurgitation is trivial by color flow Doppler. The jet is centrally-directed. There is moderate-severe stenosis of the aortic valve, with a calculated valve area of  0.98 cm. There is Moderate aortic annular calcification noted. There is no evidence of a vegetation on the aortic valve.  Pulmonic Valve: The pulmonic valve was normal in structure. Pulmonic valve regurgitation is not visualized by color flow Doppler.   Aorta: The aortic root, ascending aorta and aortic arch are normal in size and structure. There is evidence of layered immobile plaque in the descending aorta; Grade I, measuring 1-52mm in size.  Pulmonary Artery: The pulmonary artery is of normal size.  +--------------+--------++ LEFT VENTRICLE         +--------------+--------++  PLAX 2D                +--------------+--------++ LVIDd:        4.60 cm  +--------------+--------++ LVIDs:        3.00 cm  +--------------+--------++ LVOT diam:    1.90 cm  +--------------+--------++ LV SV:        62 ml    +--------------+--------++ LV SV Index:  36.99    +--------------+--------++ LVOT Area:    2.84 cm +--------------+--------++                        +--------------+--------++  +------------------+------------++ AORTIC VALVE                   +------------------+------------++ AV Area (Vmax):   0.96 cm     +------------------+------------++ AV Area (Vmean):  0.84 cm     +------------------+------------++ AV Area (VTI):    0.98 cm     +------------------+------------++ AV Vmax:          237.50 cm/s  +------------------+------------++ AV Vmean:         176.900 cm/s +------------------+------------++ AV VTI:           0.556 m      +------------------+------------++ AV Peak Grad:     22.6 mmHg    +------------------+------------++ AV Mean Grad:     16.5 mmHg    +------------------+------------++ LVOT Vmax:        80.70 cm/s   +------------------+------------++ LVOT Vmean:       52.300 cm/s  +------------------+------------++ LVOT VTI:         0.193 m       +------------------+------------++ LVOT/AV VTI ratio:0.35         +------------------+------------++  +------------+-------++ AORTA               +------------+-------++ Ao Asc diam:2.80 cm +------------+-------++   +--------------+-------+ SHUNTS                +--------------+-------+ Systemic VTI: 0.19 m  +--------------+-------+ Systemic Diam:1.90 cm +--------------+-------+   Suella Broad MD Electronically signed by Suella Broad MD Signature Date/Time: 07/07/2019/9:13:16 AM    Final    CT SCANS  CT CORONARY MORPH W/CTA COR W/SCORE 05/26/2019  Addendum 05/26/2019 10:20 AM ADDENDUM REPORT: 05/26/2019 10:18  CLINICAL DATA:  Severe Aortic Stenosis.  EXAM: Cardiac TAVR CT  TECHNIQUE: The patient was scanned on a [employer]. A 120 kV retrospective scan was triggered in the descending thoracic aorta at 111 HU's. Gantry rotation speed was 250 msecs and collimation was .6 mm. No beta blockade or nitro were given. The 3D data set was reconstructed in 5% intervals of the R-R cycle. Due to motion artifact, systolic phases were examined using millisecond reconstruction at 10 msec intervals. Systolic and diastolic phases were analyzed on a dedicated work station using MPR, MIP and VRT modes. The patient received 80 cc of contrast.  FINDINGS: Image quality: Excellent.  Noise artifact is: Moderate motion artifact was present due to a likely PVC. Millisecond reconstruction was used at 200 ms.  Valve Morphology: The aortic valve is tricuspid and moderately calcified and severely thickened with restricted leaflet motion in systole.  Aortic Valve Calcium score: 488  Aortic annular dimension:  Phase assessed: 200 ms  Annular area: 367 mm2  Annular perimeter: 68.9 mm  Max diameter: 23.7 mm  Min diameter: 19.6 mm  Annular and subannular calcification: No significant annular/sub-annular calcification.  Optimal coplanar  projection: LAO 4 CAU 11  Coronary  Artery Height above Annulus:  Left Main: 15.6 mm  Right Coronary: 13.4 mm (non-dominant)  Sinus of Valsalva Measurements:  Non-coronary: 27 mm  Right-coronary: 27 mm  Left-coronary: 27 mm  Sinus of Valsalva Height:  Non-coronary: 19 mm  Right-coronary: 18.5  Mm  Left-coronary: 19.4 mm  Sinotubular Junction: 23 mm.  Mild calcified plaque  Ascending Thoracic Aorta: 27 mm.  No significant calcifications.  Coronary Arteries: CAC score of 0. Normal coronary origin. Left dominance. The study was performed without use of NTG and is insufficient for plaque evaluation. Refer to recent cardiac cath for coronary evaluation.  Cardiac Morphology:  Right Atrium: Right atrial size is within normal limits.  Right Ventricle: The right ventricular cavity is within normal limits.  Left Atrium: Left atrial size is normal in size with no left atrial appendage filling defect.  Left Ventricle: The ventricular cavity size is within normal limits. There are no stigmata of prior infarction. There is no abnormal filling defect. There is severe asymmetric basal septal hypertrophy (17.5 mm).  Pulmonary arteries: Normal in size without proximal filling defect.  Pulmonary veins: Normal pulmonary venous drainage.  Pericardium: Normal thickness with no significant effusion or calcium present.  Mitral Valve: The mitral valve is normal structure without significant calcification.  Extra-cardiac findings: See attached radiology report for non-cardiac structures.  IMPRESSION: 1. Annular measurements appropriate for 23 mm Edwards Sapien 3. 200 ms reconstruction used due cardiac motion artifact in original dataset.  2. No significant annular or sub-annular calcifications.  3. Sufficient coronary to annulus distance.  4. Optimal Fluoroscopic Angle for Delivery: LAO 4 CAU 11  5. No thrombus in the left atrial appendage.  6. Severe asymmetric basal  septal hypertrophy (17.5 mm).  Eleonore Chiquito, MD   Electronically Signed By: Eleonore Chiquito On: 05/26/2019 10:18  Narrative EXAM: OVER-READ INTERPRETATION  CT CHEST  The following report is an over-read performed by radiologist Dr. Vinnie Langton of [facility], [place] on 05/25/2019. This over-read does not include interpretation of cardiac or coronary anatomy or pathology. The coronary calcium score/coronary CTA interpretation by the cardiologist is attached.  COMPARISON:  Chest CTA 10/30/2015.  FINDINGS: Extracardiac findings will be described separately under dictation for contemporaneously obtained CTA chest, abdomen and pelvis.  IMPRESSION: Please see separate dictation for contemporaneously obtained CTA chest, abdomen and pelvis dated 05/25/2019 for full description of relevant extracardiac findings.  Electronically Signed: By: Vinnie Langton M.D. On: 05/25/2019 12:06           EKG:  EKG is ordered today.  EKG performed today demonstrates atrial fibrillation 91 bpm with no acute ST/T wave changes.   Recent Labs: 01/10/2022: Magnesium 2.2 03/14/2022: NT-Pro BNP 393 04/30/2022: BUN 14; Creatinine, Ser 0.77; Hemoglobin 13.3; Platelets 340; Potassium 4.4; Sodium 141  Recent Lipid Panel    Component Value Date/Time   CHOL 242 (H) 11/01/2017 1030   TRIG 175 (H) 11/01/2017 1030   HDL 47 11/01/2017 1030   CHOLHDL 5.1 (H) 11/01/2017 1030   LDLCALC 160 (H) 11/01/2017 1030   LDLDIRECT 166 (H) 09/15/2020 1326    Risk Assessment/Calculations:   CHA2DS2-VASc Score = 5   This indicates a 7.2% annual risk of stroke. The patient's score is based upon: CHF History: 1 HTN History: 1 Diabetes History: 1 Stroke History: 0 Vascular Disease History: 0 Age Score: 1 Gender Score: 1     Home Medications   Current Meds  Medication Sig   acebutolol (SECTRAL) 200 MG capsule Take 1 capsule (200 mg  total) by mouth daily.   Acetylcysteine (NAC PO) Take 3 tablets by  mouth daily. Unknown strength   amoxicillin (AMOXIL) 500 MG capsule TAKE 4 CAPSULES BY MOUTH ONE HOUR BEFORE DENTAL PROCEDURES (Patient taking differently: Take 2,000 mg by mouth See admin instructions. TAKE 4 CAPSULES BY MOUTH ONE HOUR BEFORE DENTAL PROCEDURES)   amphetamine-dextroamphetamine (ADDERALL) 20 MG tablet Take 10-20 mg by mouth daily as needed (ADD and/or hypersomnolence).    apixaban (ELIQUIS) 5 MG TABS tablet Take 1 tablet (5 mg total) by mouth 2 (two) times daily.   bimatoprost (LUMIGAN) 0.03 % ophthalmic solution Place 1 drop into both eyes at bedtime.   diltiazem (CARDIZEM) 30 MG tablet Take 1 tablet (30 mg total) by mouth every 8 (eight) hours as needed (palpations).   furosemide (LASIX) 20 MG tablet Take 20 mg by mouth daily.   LORazepam (ATIVAN) 1 MG tablet Take 1 mg by mouth at bedtime as needed for anxiety or sleep.    ondansetron (ZOFRAN) 8 MG tablet Take 4-8 mg by mouth every 8 (eight) hours as needed for nausea or vomiting (nausea while traveling).   Vitamin D, Ergocalciferol, (DRISDOL) 1.25 MG (50000 UNIT) CAPS capsule Take 50,000 Units by mouth every 7 (seven) days.     Review of Systems      All other systems reviewed and are otherwise negative except as noted above.  Physical Exam    VS:  BP 122/62   Pulse 86   Ht 5\' 2"  (1.575 m)   Wt 134 lb (60.8 kg)   LMP  (LMP Unknown)   BMI 24.51 kg/m  , BMI Body mass index is 24.51 kg/m.  Wt Readings from Last 3 Encounters:  05/28/22 134 lb (60.8 kg)  04/30/22 136 lb 4.8 oz (61.8 kg)  03/14/22 136 lb (61.7 kg)     GEN: Well nourished, well developed, in no acute distress. HEENT: normal. Neck: Supple, no JVD, carotid bruits, or masses. Cardiac: IRIR, no murmurs, rubs, or gallops. No clubbing, cyanosis, edema.  Radials/PT 2+ and equal bilaterally.  Respiratory:  Respirations regular and unlabored, clear to auscultation bilaterally. GI: Soft, nontender, nondistended. MS: No deformity or atrophy. Skin: Warm and  dry, no rash. Neuro:  Strength and sensation are intact. Psych: Normal affect.  Assessment & Plan    PAF/hypercoagulable state-ablation upcoming with Dr. Curt Bears. Has been in recurrent atrial fibrillation x 1 week symptomatic with shortness of breath, fatigue. Taking Diltiazem 30mg  PRN consistently without restoration of NSR. Interested in cardioversion. Will start Diltiazem 120mg  QD until she returns to NSR. Continue Eliquis 5mg  BID (did have prior LAA clipping). Denies missed doses. CHA2DS2-VASc Score = 5 [CHF History: 1, HTN History: 1, Diabetes History: 1, Stroke History: 0, Vascular Disease History: 0, Age Score: 1, Gender Score: 1].  Therefore, the patient's annual risk of stroke is 7.2 %.    Plan for cardioversion.   Shared Decision Making/Informed Consent The risks (stroke, cardiac arrhythmias rarely resulting in the need for a temporary or permanent pacemaker, skin irritation or burns and complications associated with conscious sedation including aspiration, arrhythmia, respiratory failure and death), benefits (restoration of normal sinus rhythm) and alternatives of a direct current cardioversion were explained in detail to Ms. Nelida Gores and she agrees to proceed.    Hypertension- BP well controlled. Continue current antihypertensive regimen.    Nonobstructive coronary disease-cardiac catheterization 05/2019 with minimal nonobstructive coronary artery disease. Heart healthy diet and regular cardiovascular exercise encouraged.    Aortic stenosis s/p AVR 06/2019-functioning appropriately  by echocardiogram 11/10/2021.  Continue optimal blood pressure control        Disposition: Follow up  2-3 weeks after cardioversion  with Jenne Campus, MD or APP.  Signed, Loel Dubonnet, NP 05/28/2022, 8:14 AM [place]

## 2022-05-29 ENCOUNTER — Telehealth: Payer: Self-pay | Admitting: *Deleted

## 2022-05-29 NOTE — Telephone Encounter (Signed)
Spent over 20 min on phone w/ pt discussing things.   Followed up with pt about her AFib and to see if I could help with anything after DCCV recommended yesterday. Pt understands that she will need TEE/DCCV, but first available at [facility] is 4/8. Pt does not want to wait that long, and getting ready to go out of town. We also discussed starting a temporary antiarrythmic prior to ablation scheduled for the end of next month but pt would like to hold off for now. Pt is going to send mychart message to [facility] (which will come to me) if she wants to readdress things w/ her afib prior to ablation end of April. Pt really appreciates my call and discussing things.

## 2022-05-29 NOTE — Telephone Encounter (Signed)
Called and spoke with pt who is upset that her cardioversion was cancelled due to anticoagulation. Pt states that people get cardioverted all the time without specific time of anticoagulation. I advised that is a TEE cardioversion which cannot be done at [employer] where pt works. Pt did not realize that [facility] did not do TEE's. Advised that you are at higher risk for stroke and pt states she knows and that she has had her appendage staples so it is not flapping. Advised that I would let Norina Buzzard, RN aware as she states she feels bad and wants to know what can be done before the ablation.

## 2022-06-01 ENCOUNTER — Telehealth: Payer: Self-pay | Admitting: Gastroenterology

## 2022-06-01 NOTE — Telephone Encounter (Signed)
Hi Dr. Lyndel Safe,  We received records for patient to be evaluated for diverticulitis, has GI history in [place]. Is now currently residing in [place], specifically asking for you to continue her care. Had colonoscopy in April 2023, report is available for you to review in EPIC. Please advise on scheduling.  Thank you

## 2022-06-03 NOTE — Telephone Encounter (Signed)
Pl make appt in APP clinic or mine- 1st available RG

## 2022-06-06 ENCOUNTER — Encounter: Payer: Self-pay | Admitting: Gastroenterology

## 2022-06-15 ENCOUNTER — Other Ambulatory Visit: Payer: Self-pay

## 2022-06-15 ENCOUNTER — Ambulatory Visit: Payer: Managed Care, Other (non HMO)

## 2022-06-15 DIAGNOSIS — I480 Paroxysmal atrial fibrillation: Secondary | ICD-10-CM

## 2022-06-16 LAB — CBC
Hematocrit: 40.4 % (ref 34.0–46.6)
Hemoglobin: 13.1 g/dL (ref 11.1–15.9)
MCH: 28.5 pg (ref 26.6–33.0)
MCHC: 32.4 g/dL (ref 31.5–35.7)
MCV: 88 fL (ref 79–97)
Platelets: 319 10*3/uL (ref 150–450)
RBC: 4.59 x10E6/uL (ref 3.77–5.28)
RDW: 14.7 % (ref 11.7–15.4)
WBC: 5.7 10*3/uL (ref 3.4–10.8)

## 2022-06-16 LAB — BASIC METABOLIC PANEL
BUN/Creatinine Ratio: 28 (ref 12–28)
BUN: 22 mg/dL (ref 8–27)
CO2: 24 mmol/L (ref 20–29)
Calcium: 9.7 mg/dL (ref 8.7–10.3)
Chloride: 100 mmol/L (ref 96–106)
Creatinine, Ser: 0.8 mg/dL (ref 0.57–1.00)
Glucose: 126 mg/dL — ABNORMAL HIGH (ref 70–99)
Potassium: 4.6 mmol/L (ref 3.5–5.2)
Sodium: 140 mmol/L (ref 134–144)
eGFR: 80 mL/min/{1.73_m2} (ref >59)

## 2022-06-19 ENCOUNTER — Telehealth: Payer: Self-pay | Admitting: Cardiology

## 2022-06-19 ENCOUNTER — Ambulatory Visit (HOSPITAL_COMMUNITY): Payer: Managed Care, Other (non HMO) | Admitting: Physician Assistant

## 2022-06-19 NOTE — Telephone Encounter (Signed)
Patient returned RN's call. 

## 2022-06-19 NOTE — Telephone Encounter (Signed)
The patient has been notified of the result and verbalized understanding.  All questions (if any) were answered. Asencion Gowda, LPN 4/0/9811 91:47 AM

## 2022-06-26 ENCOUNTER — Telehealth (HOSPITAL_COMMUNITY): Payer: Self-pay | Admitting: Emergency Medicine

## 2022-06-26 NOTE — Telephone Encounter (Signed)
Attempted to call patient regarding upcoming cardiac CT appointment. °Left message on voicemail with name and callback number °Negin Hegg RN Navigator Cardiac Imaging °[facility] °[phone] Office °[phone] Cell ° °

## 2022-06-28 ENCOUNTER — Ambulatory Visit (HOSPITAL_COMMUNITY)
Admission: RE | Admit: 2022-06-28 | Discharge: 2022-06-28 | Disposition: A | Discharge status: Home / Self Care | Payer: Managed Care, Other (non HMO) | Source: Ambulatory Visit | Attending: Cardiology | Admitting: Cardiology

## 2022-06-28 DIAGNOSIS — I480 Paroxysmal atrial fibrillation: Secondary | ICD-10-CM | POA: Diagnosis present

## 2022-06-28 MED ORDER — IOHEXOL 350 MG/ML SOLN
95.0000 mL | Freq: Once | INTRAVENOUS | Status: AC | PRN
  Administered 2022-06-28: 95 mL via INTRAVENOUS

## 2022-07-02 NOTE — Telephone Encounter (Signed)
Pt advised to hold Farxiga for her upcoming procedure this Thursday. Pt reports she has not started this and aware if she decides to start she will need to wait until after ablation. Patient verbalized understanding and agreeable to plan.

## 2022-07-03 ENCOUNTER — Telehealth: Payer: Self-pay | Admitting: Family

## 2022-07-03 NOTE — Telephone Encounter (Signed)
PT CAME IN ASKING TO MAKE A NEW PATIENT APPT. I TOLD PT WE ARE CURRENTLY NOT TAKING NEW PATIENT APPT UNTIL END OF SUMMER DUE TO ONLY HAVING 2 PROVIDERS IN THE OFFICE. PT DID STATE HER INSURANCE BROKER SAID WE WOULD SEE HER ANYWAYS. I TOLD HER THERE HAS TO BE APPROVAL FROM [facility] OR DR. COX. PT THRU HER HANDS AND SAID FORGET IT AND WALKED OUT.

## 2022-07-04 NOTE — Pre-Procedure Instructions (Signed)
Instructed patient on the following items: Arrival time 5:15 Nothing to eat or drink after midnight No meds AM of procedure Responsible person to drive you home and stay with you for 24 hrs  Have you missed any doses of anti-coagulant Eliquis- takes twice a day, hasn't missed any doses, don't take in the morning.

## 2022-07-04 NOTE — Anesthesia Preprocedure Evaluation (Signed)
Anesthesia Evaluation  Patient identified by MRN, date of birth, ID band Patient awake    Reviewed: Allergy & Precautions, NPO status , Patient's Chart, lab work & pertinent test results  History of Anesthesia Complications Negative for: history of anesthetic complications  Airway Mallampati: II  TM Distance: >3 FB Neck ROM: Full    Dental no notable dental hx. (+) Dental Advisory Given, Teeth Intact   Pulmonary shortness of breath, asthma , sleep apnea , pneumonia   Pulmonary exam normal breath sounds clear to auscultation       Cardiovascular hypertension, Pt. on medications and Pt. on home beta blockers +CHF  + dysrhythmias Atrial Fibrillation + Valvular Problems/Murmurs MR  Rhythm:Irregular Rate:Normal  Echo 11/2021  1. Left ventricular ejection fraction, by estimation, is 50 to 55%. The left ventricle has low normal function. The left ventricle has no regional wall motion abnormalities. Left ventricular diastolic parameters were normal.   2. Right ventricular systolic function is normal. The right ventricular size is normal.   3. The mitral valve is normal in structure. Mild mitral valve regurgitation. No evidence of mitral stenosis.   4. There is a 21 mm Edwards Inspiris valve present in the aortic position. Procedure Date: 07/02/2019. Echo findings are consistent with normal structure and function of the aortic valve prosthesis.   5. The inferior vena cava is normal in size with greater than 50% respiratory variability, suggesting right atrial pressure of 3 mmHg.    S/p AVR 07/02/19    Neuro/Psych  PSYCHIATRIC DISORDERS Anxiety Depression     Neuromuscular disease    GI/Hepatic hiatal hernia,GERD  Controlled,,(+) Hepatitis -, Cytomegalovirus  Endo/Other  diabetesHypothyroidism    Renal/GU negative Renal ROS     Musculoskeletal  (+) Arthritis ,    Abdominal   Peds  Hematology  (+) Blood dyscrasia, anemia  On  eliquis    Anesthesia Other Findings Covid neg 4/20   Reproductive/Obstetrics                              Anesthesia Physical Anesthesia Plan  ASA: 3  Anesthesia Plan: General   Post-op Pain Management: Tylenol PO (pre-op)* and Minimal or no pain anticipated   Induction: Intravenous  PONV Risk Score and Plan: 3 and Treatment may vary due to age or medical condition, Ondansetron and Dexamethasone  Airway Management Planned: Oral ETT  Additional Equipment: None  Intra-op Plan:   Post-operative Plan: Extubation in OR  Informed Consent: I have reviewed the patients History and Physical, chart, labs and discussed the procedure including the risks, benefits and alternatives for the proposed anesthesia with the patient or authorized representative who has indicated his/her understanding and acceptance.     Dental advisory given  Plan Discussed with: CRNA  Anesthesia Plan Comments:          Anesthesia Quick Evaluation

## 2022-07-04 NOTE — Telephone Encounter (Signed)
Per Dr. Sedalia Muta on to schedule this patient. I have the patient scheduled for 07/24/2022. Per [facility], to notify [employer] (insurance agent ) when the appointment has been scheduled. Annette Stable has been notified.

## 2022-07-05 ENCOUNTER — Ambulatory Visit (HOSPITAL_BASED_OUTPATIENT_CLINIC_OR_DEPARTMENT_OTHER): Payer: Medicare Other

## 2022-07-05 ENCOUNTER — Encounter (HOSPITAL_COMMUNITY): Payer: Self-pay | Admitting: Cardiology

## 2022-07-05 ENCOUNTER — Ambulatory Visit (HOSPITAL_COMMUNITY)
Admission: RE | Admit: 2022-07-05 | Discharge: 2022-07-05 | Disposition: A | Discharge status: Home / Self Care | Payer: Medicare Other | Attending: Cardiology | Admitting: Cardiology

## 2022-07-05 ENCOUNTER — Ambulatory Visit (HOSPITAL_COMMUNITY): Payer: Medicare Other

## 2022-07-05 ENCOUNTER — Encounter (HOSPITAL_COMMUNITY)
Admission: RE | Disposition: A | Discharge status: Home / Self Care | Payer: Self-pay | Source: Home / Self Care | Attending: Cardiology

## 2022-07-05 DIAGNOSIS — I110 Hypertensive heart disease with heart failure: Secondary | ICD-10-CM | POA: Insufficient documentation

## 2022-07-05 DIAGNOSIS — G4730 Sleep apnea, unspecified: Secondary | ICD-10-CM

## 2022-07-05 DIAGNOSIS — I509 Heart failure, unspecified: Secondary | ICD-10-CM

## 2022-07-05 DIAGNOSIS — Z8249 Family history of ischemic heart disease and other diseases of the circulatory system: Secondary | ICD-10-CM | POA: Insufficient documentation

## 2022-07-05 DIAGNOSIS — Z952 Presence of prosthetic heart valve: Secondary | ICD-10-CM | POA: Insufficient documentation

## 2022-07-05 DIAGNOSIS — I2510 Atherosclerotic heart disease of native coronary artery without angina pectoris: Secondary | ICD-10-CM | POA: Diagnosis not present

## 2022-07-05 DIAGNOSIS — I4819 Other persistent atrial fibrillation: Secondary | ICD-10-CM | POA: Diagnosis not present

## 2022-07-05 DIAGNOSIS — Z8619 Personal history of other infectious and parasitic diseases: Secondary | ICD-10-CM | POA: Diagnosis not present

## 2022-07-05 DIAGNOSIS — I4891 Unspecified atrial fibrillation: Secondary | ICD-10-CM

## 2022-07-05 HISTORY — PX: ATRIAL FIBRILLATION ABLATION: EP1191

## 2022-07-05 LAB — POCT ACTIVATED CLOTTING TIME
Activated Clotting Time: 363 seconds
Activated Clotting Time: 325 seconds

## 2022-07-05 LAB — GLUCOSE, CAPILLARY
Glucose-Capillary: 132 mg/dL — ABNORMAL HIGH (ref 70–99)
Glucose-Capillary: 143 mg/dL — ABNORMAL HIGH (ref 70–99)
Glucose-Capillary: 154 mg/dL — ABNORMAL HIGH (ref 70–99)

## 2022-07-05 SURGERY — ATRIAL FIBRILLATION ABLATION
Anesthesia: General

## 2022-07-05 MED ORDER — ROCURONIUM BROMIDE 10 MG/ML (PF) SYRINGE
PREFILLED_SYRINGE | INTRAVENOUS | Status: DC | PRN
  Administered 2022-07-05: 50 mg via INTRAVENOUS

## 2022-07-05 MED ORDER — HEPARIN (PORCINE) IN NACL 1000-0.9 UT/500ML-% IV SOLN
INTRAVENOUS | Status: DC | PRN
  Administered 2022-07-05 (×4): 500 mL

## 2022-07-05 MED ORDER — PHENYLEPHRINE HCL-NACL 20-0.9 MG/250ML-% IV SOLN
INTRAVENOUS | Status: AC
  Filled 2022-07-05: qty 250

## 2022-07-05 MED ORDER — HEPARIN SODIUM (PORCINE) 1000 UNIT/ML IJ SOLN
INTRAMUSCULAR | Status: DC | PRN
  Administered 2022-07-05: 1000 [IU] via INTRAVENOUS

## 2022-07-05 MED ORDER — PROPOFOL 10 MG/ML IV BOLUS
INTRAVENOUS | Status: DC | PRN
  Administered 2022-07-05: 100 mg via INTRAVENOUS
  Administered 2022-07-05: 40 mg via INTRAVENOUS

## 2022-07-05 MED ORDER — ACETAMINOPHEN 500 MG PO TABS
1000.0000 mg | ORAL_TABLET | Freq: Once | ORAL | Status: AC
  Administered 2022-07-05: 1000 mg via ORAL
  Filled 2022-07-05: qty 2

## 2022-07-05 MED ORDER — ONDANSETRON HCL 4 MG/2ML IJ SOLN: 4.0000 mg | Freq: Four times a day (QID) | INTRAMUSCULAR | Status: DC | PRN

## 2022-07-05 MED ORDER — DEXAMETHASONE SODIUM PHOSPHATE 10 MG/ML IJ SOLN
INTRAMUSCULAR | Status: DC | PRN
  Administered 2022-07-05: 5 mg via INTRAVENOUS

## 2022-07-05 MED ORDER — PHENYLEPHRINE HCL-NACL 20-0.9 MG/250ML-% IV SOLN
INTRAVENOUS | Status: DC | PRN
  Administered 2022-07-05: 25 ug/min via INTRAVENOUS

## 2022-07-05 MED ORDER — SODIUM CHLORIDE 0.9% FLUSH: 3.0000 mL | INTRAVENOUS | Status: DC | PRN

## 2022-07-05 MED ORDER — ACETAMINOPHEN 325 MG PO TABS: 650.0000 mg | ORAL_TABLET | ORAL | Status: DC | PRN

## 2022-07-05 MED ORDER — SODIUM CHLORIDE 0.9 % IV SOLN: 250.0000 mL | INTRAVENOUS | Status: DC | PRN

## 2022-07-05 MED ORDER — NOREPINEPHRINE 4 MG/250ML-% IV SOLN
INTRAVENOUS | Status: AC
  Filled 2022-07-05: qty 250

## 2022-07-05 MED ORDER — HEPARIN SODIUM (PORCINE) 1000 UNIT/ML IJ SOLN
INTRAMUSCULAR | Status: DC | PRN
  Administered 2022-07-05: 14000 [IU] via INTRAVENOUS
  Administered 2022-07-05: 2000 [IU] via INTRAVENOUS

## 2022-07-05 MED ORDER — SODIUM CHLORIDE 0.9 % IV SOLN: INTRAVENOUS | Status: DC

## 2022-07-05 MED ORDER — FENTANYL CITRATE (PF) 250 MCG/5ML IJ SOLN
INTRAMUSCULAR | Status: DC | PRN
  Administered 2022-07-05: 25 ug via INTRAVENOUS

## 2022-07-05 MED ORDER — ONDANSETRON HCL 4 MG/2ML IJ SOLN
INTRAMUSCULAR | Status: DC | PRN
  Administered 2022-07-05: 4 mg via INTRAVENOUS

## 2022-07-05 MED ORDER — HEPARIN SODIUM (PORCINE) 1000 UNIT/ML IJ SOLN
INTRAMUSCULAR | Status: AC
  Filled 2022-07-05: qty 10

## 2022-07-05 MED ORDER — SUGAMMADEX SODIUM 200 MG/2ML IV SOLN
INTRAVENOUS | Status: DC | PRN
  Administered 2022-07-05: 200 mg via INTRAVENOUS

## 2022-07-05 MED ORDER — PROTAMINE SULFATE 10 MG/ML IV SOLN
INTRAVENOUS | Status: DC | PRN
  Administered 2022-07-05: 40 mg via INTRAVENOUS

## 2022-07-05 MED ORDER — LIDOCAINE 2% (20 MG/ML) 5 ML SYRINGE
INTRAMUSCULAR | Status: DC | PRN
  Administered 2022-07-05: 60 mg via INTRAVENOUS

## 2022-07-05 SURGICAL SUPPLY — 20 items
BAG SNAP BAND KOVER 36X36 (MISCELLANEOUS) IMPLANT
CATH 8FR REPROCESSED SOUNDSTAR (CATHETERS) ×1 IMPLANT
CATH 8FR SOUNDSTAR REPROCESSED (CATHETERS) IMPLANT
CATH ABLAT QDOT MICRO BI TC DF (CATHETERS) IMPLANT
CATH OCTARAY 2.0 F 3-3-3-3-3 (CATHETERS) IMPLANT
CATH PIGTAIL STEERABLE D1 8.7 (WIRE) IMPLANT
CATH S-M CIRCA TEMP PROBE (CATHETERS) IMPLANT
CATH WEB BI DIR CSDF CRV REPRO (CATHETERS) IMPLANT
CLOSURE PERCLOSE PROSTYLE (VASCULAR PRODUCTS) IMPLANT
COVER SWIFTLINK CONNECTOR (BAG) ×1 IMPLANT
PACK EP LATEX FREE (CUSTOM PROCEDURE TRAY) ×1
PACK EP LF (CUSTOM PROCEDURE TRAY) ×1 IMPLANT
PAD DEFIB RADIO PHYSIO CONN (PAD) ×1 IMPLANT
PATCH CARTO3 (PAD) IMPLANT
SHEATH CARTO VIZIGO MED CURVE (SHEATH) IMPLANT
SHEATH PINNACLE 7F 10CM (SHEATH) IMPLANT
SHEATH PINNACLE 8F 10CM (SHEATH) IMPLANT
SHEATH PINNACLE 9F 10CM (SHEATH) IMPLANT
SHEATH PROBE COVER 6X72 (BAG) IMPLANT
TUBING SMART ABLATE COOLFLOW (TUBING) IMPLANT

## 2022-07-05 NOTE — Discharge Instructions (Signed)

## 2022-07-05 NOTE — Anesthesia Procedure Notes (Signed)
Procedure Name: Intubation Date/Time: 07/05/2022 7:41 AM  Performed by: Margarita Rana, CRNAPre-anesthesia Checklist: Patient identified, Patient being monitored, Timeout performed, Emergency Drugs available and Suction available Patient Re-evaluated:Patient Re-evaluated prior to induction Oxygen Delivery Method: Circle System Utilized Preoxygenation: Pre-oxygenation with 100% oxygen Induction Type: IV induction Ventilation: Mask ventilation without difficulty Laryngoscope Size: Mac and 3 Grade View: Grade I Tube type: Oral Tube size: 7.0 mm Number of attempts: 1 Airway Equipment and Method: Stylet Placement Confirmation: ETT inserted through vocal cords under direct vision, positive ETCO2 and breath sounds checked- equal and bilateral Secured at: 21 cm Tube secured with: Tape Dental Injury: Teeth and Oropharynx as per pre-operative assessment

## 2022-07-05 NOTE — Transfer of Care (Signed)
Immediate Anesthesia Transfer of Care Note  Patient: Lindsay Guerrero  Procedure(s) Performed: ATRIAL FIBRILLATION ABLATION  Patient Location: PACU  Anesthesia Type:General  Level of Consciousness: awake and drowsy  Airway & Oxygen Therapy: Patient Spontanous Breathing and Patient connected to nasal cannula oxygen  Post-op Assessment: Report given to RN and Post -op Vital signs reviewed and stable  Post vital signs: Reviewed and stable  Last Vitals:  Vitals Value Taken Time  BP 109/67 07/05/22 0945  Temp    Pulse 79 07/05/22 0949  Resp 16 07/05/22 0949  SpO2 99 % 07/05/22 0949  Vitals shown include unvalidated device data.  Last Pain:  Vitals:   07/05/22 0553  TempSrc: Oral  PainSc:          Complications: There were no known notable events for this encounter.

## 2022-07-05 NOTE — Progress Notes (Signed)
Patient and wife was given discharge instructions. Both verbalized understanding. 

## 2022-07-05 NOTE — H&P (Signed)
Electrophysiology Office Note   Date:  07/05/2022   ID:  Lindsay Guerrero Mar 29, 1955, MRN [id number]  PCP:  Hortencia Conradi, NP  Cardiologist:  Bing Matter Primary Electrophysiologist:  Daylee Delahoz Jorja Loa, MD    Chief Complaint: AF   History of Present Illness: Lindsay Guerrero is a 67 y.o. female who is being seen today for the evaluation of AF at the request of No ref. provider found. Presenting today for electrophysiology evaluation.  Significant for aortic stenosis post AVR April 2021, nonobstructive coronary artery disease, hypertension, paroxysmal atrial fibrillation.  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  She had an episode of atrial fibrillation requiring hospitalization and cardioversion.  She feels poorly in atrial fibrillation with weakness fatigue and shortness of breath.  She has plans for ablation 05/22/2022.  Today, denies symptoms of palpitations, chest pain, shortness of breath, orthopnea, PND, lower extremity edema, claudication, dizziness, presyncope, syncope, bleeding, or neurologic sequela. The patient is tolerating medications without difficulties. Plan AF ablation today.     Past Medical History:  Diagnosis Date   Adult attention deficit disorder 05/15/2013   Anxiety state 11/19/2012   ICD10   Aortic stenosis 12/13/2017   Moderate by echocardiogram from 2019 mean gradient 25   Aortic stenosis, mild 07/13/2016   Moderate by echocardiogram from 2019 mean gradient 25   Aortic stenosis, severe 07/02/2019   Aortic valve sclerosis 05/24/2015   Formatting of this note might be different from the original. Without stenosis. Last Echo 2012   Asthma    mild   Atrial fibrillation (HCC) 06/28/2018   Atrial fibrillation with RVR (HCC) 06/28/2018   CHF (congestive heart failure) (HCC)    Chronic bilateral low back pain 01/27/2015   Depression    Diabetes mellitus without complication (HCC)    borderline   Dyspnea    on exertion   Dyspnea on  exertion 11/01/2017   Elective surgery for purposes other than treating health conditions 09/27/2010   ICD10   Essential hypertension 06/30/2018   Family history of colon cancer 03/01/2005   Family history of premature coronary artery disease 03/01/2005   GAD (generalized anxiety disorder) 03/01/2005   Formatting of this note might be different from the original. with panic attacks   Hepatitis    due to cytomegalovirus-resolved   History of breast cancer 06/30/2018   History of hiatal hernia    mild,found on a CT   History of malignant neoplasm of breast 03/01/2005   Formatting of this note might be different from the original. node neg; see chartnotes for treatment   HTN (hypertension), benign 07/13/2016   Hyperlipidemia 06/30/2018   Hypokalemia 06/30/2018   Inflamed seborrheic keratosis 08/08/2012   Irritable bowel syndrome 03/01/2005   Malignant neoplasm of upper-inner quadrant of female breast (HCC) 11/01/2017   Primary  diagnosis   Mild intermittent asthma without complication 03/01/2005   Formatting of this note might be different from the original. mild   Mitral regurgitation 12/13/2017   Mild to moderate based on echo from 2019   Mixed hyperlipidemia 07/20/2019   NICM (nonischemic cardiomyopathy) (HCC) 11/01/2015   Osteopenia 01/27/2015   Paroxysmal atrial fibrillation (HCC) 07/14/2019   Pneumonia 2018   Post menopausal problems 01/27/2015   Precordial pain 11/01/2015   Psoriatic arthritis (HCC) 04/25/2015   Recurrent genital HSV (herpes simplex virus) infection 03/01/2005   Formatting of this note might be different from the original. HSV genital   Restless legs syndrome 03/01/2005  Rosacea 03/01/2005   Routine history and physical examination of adult 09/22/2012   Formatting of this note might be different from the original. PROBLEM LIST:  1. Left node negative breast cancer 2002, stage I, grade 1, status post  lumpectomy and node dissection, external beam radiation,   chemotherapy/Cytoxan, mild left arm lymphedema with cellulitis 2004.  2. Anxiety disorder with panic attacks, family history of same, globus  pharyngeus, palpitations, atypical chest pain, str   S/P aortic valve replacement with bioprosthetic valve 21 mm Edwards INSPIRIS RESILIA  07/02/2019   Size 21 mm   S/P AVR (aortic valve replacement) 07/20/2019   Seborrheic keratoses 09/10/2016   Sleep apnea    borderline   Vaginal dryness 01/27/2015   Past Surgical History:  Procedure Laterality Date   AORTIC VALVE REPLACEMENT N/A 07/02/2019   Procedure: AORTIC VALVE REPLACEMENT (AVR) USING INSPIRIS VALVE SIZE ;  Surgeon: Alleen Borne, MD;  Location: [facility] OR;  Service: Open Heart Surgery;  Laterality: N/A;   BREAST LUMPECTOMY Left 2003   sentinal node bx.   CARDIOVERSION N/A 07/10/2019   Procedure: CARDIOVERSION;  Surgeon: Little Ishikawa, MD;  Location: [facility] ENDOSCOPY;  Service: Cardiovascular;  Laterality: N/A;   CESAREAN SECTION     CLIPPING OF ATRIAL APPENDAGE N/A 07/02/2019   Procedure: CLIPPING OF ATRIAL APPENDAGE USING PRO2 CLIP SIZE ;  Surgeon: Alleen Borne, MD;  Location: [facility] OR;  Service: Open Heart Surgery;  Laterality: N/A;   COSMETIC SURGERY     IR THORACENTESIS ASP PLEURAL SPACE W/IMG GUIDE  07/07/2019   RIGHT/LEFT HEART CATH AND CORONARY ANGIOGRAPHY N/A 05/20/2019   Procedure: RIGHT/LEFT HEART CATH AND CORONARY ANGIOGRAPHY;  Surgeon: Tonny Bollman, MD;  Location: [facility] INVASIVE CV LAB;  Service: Cardiovascular;  Laterality: N/A;   TEE WITHOUT CARDIOVERSION N/A 07/02/2019   Procedure: TRANSESOPHAGEAL ECHOCARDIOGRAM (TEE);  Surgeon: Alleen Borne, MD;  Location: [facility] OR;  Service: Open Heart Surgery;  Laterality: N/A;   TONSILLECTOMY       Current Facility-Administered Medications  Medication Dose Route Frequency Provider Last Rate Last Admin   0.9 %  sodium chloride infusion   Intravenous Continuous Regan Lemming, MD 50 mL/hr at 07/05/22 0555 New Bag at 07/05/22 0555     Allergies:   Lisinopril and Metoprolol   Social History:  The patient  reports that she has never smoked. She has never used smokeless tobacco. She reports current alcohol use. She reports that she does not use drugs.   Family History:  The patient's family history includes Heart Problems in her father and mother; Heart attack in her sister; Hypertension in her mother.   ROS:  Please see the history of present illness.   Otherwise, review of systems is positive for none.   All other systems are reviewed and negative.   PHYSICAL EXAM: VS:  BP 127/83   Pulse 94   Temp 97.8 F (36.6 C) (Oral)   Resp 18   Ht  (1.575 m)   Wt 60.3 kg   LMP  (LMP Unknown)   SpO2 98%   BMI 24.33 kg/m  , BMI Body mass index is 24.33 kg/m. GEN: Well nourished, well developed, in no acute distress  HEENT: normal  Neck: no JVD, carotid bruits, or masses Cardiac: RRR; no murmurs, rubs, or gallops,no edema  Respiratory:  clear to auscultation bilaterally, normal work of breathing GI: soft, nontender, nondistended, + BS MS: no deformity or atrophy  Skin: warm and dry Neuro:  Strength and  sensation are intact Psych: euthymic mood, full affect  Recent Labs: 01/10/2022: Magnesium 2.2 03/14/2022: NT-Pro BNP 393 06/15/2022: BUN 22; Creatinine, Ser 0.80; Hemoglobin 13.1; Platelets 319; Potassium 4.6; Sodium 140    Lipid Panel     Component Value Date/Time   CHOL 242 (H) 11/01/2017 1030   TRIG 175 (H) 11/01/2017 1030   HDL 47 11/01/2017 1030   CHOLHDL 5.1 (H) 11/01/2017 1030   LDLCALC 160 (H) 11/01/2017 1030   LDLDIRECT 166 (H) 09/15/2020 1326     Wt Readings from Last 3 Encounters:  07/05/22 60.3 kg  05/28/22 60.8 kg  04/30/22 61.8 kg      Other studies Reviewed: Additional studies/ records that were reviewed today include: TTE 11/10/2021 Review of the above records today demonstrates:   1. Left ventricular ejection fraction, by estimation, is 50 to 55%. The  left ventricle has low normal  function. The left ventricle has no regional  wall motion abnormalities. Left ventricular diastolic parameters were  normal.   2. Right ventricular systolic function is normal. The right ventricular  size is normal.   3. The mitral valve is normal in structure. Mild mitral valve  regurgitation. No evidence of mitral stenosis.   4. There is a 21 mm Edwards Inspiris valve present in the aortic  position. Procedure Date: 07/02/2019. Echo findings are consistent with  normal structure and function of the aortic valve prosthesis.   5. The inferior vena cava is normal in size with greater than 50%  respiratory variability, suggesting right atrial pressure of 3 mmHg.    ASSESSMENT AND PLAN:  1.  Persistent atrial fibrillation: MAEVA DANT has presented today for surgery, with the diagnosis of AF.  The various methods of treatment have been discussed with the patient and family. After consideration of risks, benefits and other options for treatment, the patient has consented to  Procedure(s): Catheter ablation as a surgical intervention .  Risks include but not limited to complete heart block, stroke, esophageal damage, nerve damage, bleeding, vascular damage, tamponade, perforation, MI, and death. The patient's history has been reviewed, patient examined, no change in status, stable for surgery.  I have reviewed the patient's chart and labs.  Questions were answered to the patient's satisfaction.    Derald Lorge Elberta Fortis, MD 07/05/2022 7:05 AM

## 2022-07-06 NOTE — Anesthesia Postprocedure Evaluation (Signed)
Anesthesia Post Note  Patient: Lindsay Guerrero  Procedure(s) Performed: ATRIAL FIBRILLATION ABLATION     Patient location during evaluation: Cath Lab Anesthesia Type: General Level of consciousness: sedated and patient cooperative Pain management: pain level controlled Vital Signs Assessment: post-procedure vital signs reviewed and stable Respiratory status: spontaneous breathing Cardiovascular status: stable Anesthetic complications: no   There were no known notable events for this encounter.  Last Vitals:  Vitals:   07/05/22 1130 07/05/22 1200  BP: 114/75 107/70  Pulse: 87 87  Resp: 18 18  Temp:    SpO2: 96% 96%    Last Pain:  Vitals:   07/06/22 0929  TempSrc:   PainSc: 0-No pain                 Lewie Loron

## 2022-07-16 ENCOUNTER — Telehealth: Payer: Self-pay | Admitting: Cardiology

## 2022-07-16 ENCOUNTER — Ambulatory Visit: Payer: Self-pay | Attending: Cardiology

## 2022-07-16 VITALS — BP 100/79 | HR 118 | Resp 18 | Ht 62.0 in | Wt 132.8 lb

## 2022-07-16 DIAGNOSIS — I480 Paroxysmal atrial fibrillation: Secondary | ICD-10-CM

## 2022-07-16 NOTE — Progress Notes (Signed)
   Nurse Visit   Date of Encounter: 07/16/2022 ID: MICHAELEEN KNITTER, DOB [birth year], MRN [id number]  PCP:  Hortencia Conradi, NP   [facility] Providers Cardiologist:  Gypsy Balsam, MD Electrophysiologist:  Regan Lemming, MD {    Visit Details   VS:  LMP  (LMP Unknown)  , BMI There is no height or weight on file to calculate BMI.  Wt Readings from Last 3 Encounters:  07/05/22 133 lb (60.3 kg)  05/28/22 134 lb (60.8 kg)  04/30/22 136 lb 4.8 oz (61.8 kg)     Reason for visit: EKG Performed today: Vitals, EKG, Provider consulted:Krasowski, and Education Changes (medications, testing, etc.) : Dr. Bing Matter to review EKG and notify pt of changes Length of Visit: 15 minutes    Medications Adjustments/Labs and Tests Ordered: No orders of the defined types were placed in this encounter.  No orders of the defined types were placed in this encounter.    Signed, Eleonore Chiquito, RN  07/16/2022 2:15 PM

## 2022-07-16 NOTE — Telephone Encounter (Signed)
Pt will come by office for EKG today.

## 2022-07-16 NOTE — Telephone Encounter (Signed)
Pt stated she had Ablation a week and a half ago and on Saturday she went into what felt like Afib to her. Pt would like a callback regarding this matter. Please advise

## 2022-07-16 NOTE — Telephone Encounter (Signed)
Returned pts call. LVM to call nurse desk phone. Per Dr. Bing Matter ask if she was at work and able to get an EKG.

## 2022-07-17 ENCOUNTER — Telehealth: Payer: Self-pay | Admitting: *Deleted

## 2022-07-17 MED ORDER — DILTIAZEM HCL 30 MG PO TABS: 30.0000 mg | ORAL_TABLET | Freq: Three times a day (TID) | ORAL | 1 refills | Status: DC | PRN

## 2022-07-17 NOTE — Telephone Encounter (Signed)
Pt reports being back in afib. Currently still in afib, HR 122. She is DOE and can tell that heart is irregular and faster when trying to do things. She has been busy recently, just got back from traveling to [facility] to see family.  While there she did have a drink.  While flying she did experience some stress. Aware that etoh/travel or a number of other things could have exacerbated afib.  Aware that there can be up to 3 months post ablation breakthrough afib. She has not taken any Diltiazem PRN.  Pt wasn't sure that she had any on hand at home so Rx sent in. Advised to call office if no improvement and/or continues to stay in afib. Patient verbalized understanding and agreeable to plan.

## 2022-07-17 NOTE — Telephone Encounter (Signed)
Regan Lemming, MD  Baird Lyons, RN In AF needs phone call tomorrow about AF post ablation.       Previous Messages    ----- Message ----- From: Neena Rhymes, RN Sent: 07/16/2022   3:52 PM EDT To: Regan Lemming, MD  Came by for EKG as she felt she was back in A-fib.

## 2022-07-19 ENCOUNTER — Encounter: Payer: Self-pay | Admitting: Cardiology

## 2022-07-20 ENCOUNTER — Encounter: Payer: Self-pay | Admitting: Gastroenterology

## 2022-07-20 ENCOUNTER — Ambulatory Visit: Payer: PPO | Admitting: Gastroenterology

## 2022-07-20 VITALS — BP 110/80 | HR 88 | Ht 61.5 in | Wt 134.2 lb

## 2022-07-20 DIAGNOSIS — R159 Full incontinence of feces: Secondary | ICD-10-CM | POA: Insufficient documentation

## 2022-07-20 DIAGNOSIS — K5732 Diverticulitis of large intestine without perforation or abscess without bleeding: Secondary | ICD-10-CM | POA: Insufficient documentation

## 2022-07-20 NOTE — Progress Notes (Signed)
07/20/2022 Lindsay Guerrero [id number] June 12, 1955   HISTORY OF PRESENT ILLNESS: This is a 67 year old female who is new to our office.  She has been referred here by her PCP, Clelia Croft, NP, for evaluation of of diverticulitis.  She had a colonoscopy in [place] in April 2023 and was found to have only sigmoid diverticulosis on that exam with a repeat recommended in 10 years (results in CareEverywhere).  On May 12, 2022 she went to the emergency department at [facility] with complaints of left lower quadrant abdominal pain and altered bowel habits.  CT scan of the abdomen and pelvis with contrast showed acute sigmoid diverticulitis.  They treated her with a 10-day course of Levaquin and Flagyl.  Symptoms have improved, bowel habits still getting back to normal.  She does have concerns of fecal incontinence as well as urinary incontinence and passing flatus involuntarily.   Past Medical History:  Diagnosis Date   Adult attention deficit disorder 05/15/2013   Anxiety state 11/19/2012   ICD10   Aortic stenosis 12/13/2017   Moderate by echocardiogram from 2019 mean gradient 25   Aortic stenosis, mild 07/13/2016   Moderate by echocardiogram from 2019 mean gradient 25   Aortic stenosis, severe 07/02/2019   Aortic valve sclerosis 05/24/2015   Formatting of this note might be different from the original. Without stenosis. Last Echo 2012   Asthma    mild   Atrial fibrillation (HCC) 06/28/2018   Atrial fibrillation with RVR (HCC) 06/28/2018   CHF (congestive heart failure) (HCC)    Chronic bilateral low back pain 01/27/2015   Depression    Diabetes mellitus without complication (HCC)    borderline   Dyspnea    on exertion   Dyspnea on exertion 11/01/2017   Elective surgery for purposes other than treating health conditions 09/27/2010   ICD10   Essential hypertension 06/30/2018   Family history of colon cancer 03/01/2005   Family history of premature coronary artery disease 03/01/2005    GAD (generalized anxiety disorder) 03/01/2005   Formatting of this note might be different from the original. with panic attacks   Hepatitis    due to cytomegalovirus-resolved   History of breast cancer 06/30/2018   History of hiatal hernia    mild,found on a CT   History of malignant neoplasm of breast 03/01/2005   Formatting of this note might be different from the original. node neg; see chartnotes for treatment   HTN (hypertension), benign 07/13/2016   Hyperlipidemia 06/30/2018   Hypokalemia 06/30/2018   Inflamed seborrheic keratosis 08/08/2012   Irritable bowel syndrome 03/01/2005   Malignant neoplasm of upper-inner quadrant of female breast (HCC) 11/01/2017   Primary  diagnosis   Mild intermittent asthma without complication 03/01/2005   Formatting of this note might be different from the original. mild   Mitral regurgitation 12/13/2017   Mild to moderate based on echo from 2019   Mixed hyperlipidemia 07/20/2019   NICM (nonischemic cardiomyopathy) (HCC) 11/01/2015   Osteopenia 01/27/2015   Paroxysmal atrial fibrillation (HCC) 07/14/2019   Pneumonia 2018   Post menopausal problems 01/27/2015   Precordial pain 11/01/2015   Psoriatic arthritis (HCC) 04/25/2015   Recurrent genital HSV (herpes simplex virus) infection 03/01/2005   Formatting of this note might be different from the original. HSV genital   Restless legs syndrome 03/01/2005   Rosacea 03/01/2005   Routine history and physical examination of adult 09/22/2012   Formatting of this note might be different from the original. PROBLEM  LIST:  1. Left node negative breast cancer 2002, stage I, grade 1, status post  lumpectomy and node dissection, external beam radiation,  chemotherapy/Cytoxan, mild left arm lymphedema with cellulitis 2004.  2. Anxiety disorder with panic attacks, family history of same, globus  pharyngeus, palpitations, atypical chest pain, str   S/P aortic valve replacement with bioprosthetic valve 21 mm Edwards INSPIRIS  RESILIA  07/02/2019   Size 21 mm   S/P AVR (aortic valve replacement) 07/20/2019   Seborrheic keratoses 09/10/2016   Sleep apnea    borderline   Vaginal dryness 01/27/2015   Past Surgical History:  Procedure Laterality Date   AORTIC VALVE REPLACEMENT N/A 07/02/2019   Procedure: AORTIC VALVE REPLACEMENT (AVR) USING INSPIRIS VALVE SIZE ;  Surgeon: Alleen Borne, MD;  Location: [facility] OR;  Service: Open Heart Surgery;  Laterality: N/A;   ATRIAL FIBRILLATION ABLATION N/A 07/05/2022   Procedure: ATRIAL FIBRILLATION ABLATION;  Surgeon: Regan Lemming, MD;  Location: [facility] INVASIVE CV LAB;  Service: Cardiovascular;  Laterality: N/A;   BREAST LUMPECTOMY Left 2003   sentinal node bx.   CARDIOVERSION N/A 07/10/2019   Procedure: CARDIOVERSION;  Surgeon: Little Ishikawa, MD;  Location[facility] ENDOSCOPY;  Service: Cardiovascular;  Laterality: N/A;   CESAREAN SECTION     CLIPPING OF ATRIAL APPENDAGE N/A 07/02/2019   Procedure: CLIPPING OF ATRIAL APPENDAGE USING PRO2 CLIP SIZE ;  Surgeon: Alleen Borne, MD;  Location: [facility] OR;  Service: Open Heart Surgery;  Laterality: N/A;   COSMETIC SURGERY     IR THORACENTESIS ASP PLEURAL SPACE W/IMG GUIDE  07/07/2019   RIGHT/LEFT HEART CATH AND CORONARY ANGIOGRAPHY N/A 05/20/2019   Procedure: RIGHT/LEFT HEART CATH AND CORONARY ANGIOGRAPHY;  Surgeon: Tonny Bollman, MD;  Location: [facility] INVASIVE CV LAB;  Service: Cardiovascular;  Laterality: N/A;   TEE WITHOUT CARDIOVERSION N/A 07/02/2019   Procedure: TRANSESOPHAGEAL ECHOCARDIOGRAM (TEE);  Surgeon: Alleen Borne, MD;  Location: [facility] OR;  Service: Open Heart Surgery;  Laterality: N/A;   TONSILLECTOMY      reports that she has never smoked. She has never used smokeless tobacco. She reports current alcohol use. She reports current drug use. Drug: Marijuana. family history includes AAA (abdominal aortic aneurysm) in her paternal grandfather; Blindness in her daughter; Colon cancer in her maternal grandmother;  Developmental delay in her daughter; Diabetes in her daughter; Heart Problems in her father and mother; Heart attack in her sister; Heart disease in her maternal grandmother; Hypertension in her mother. Allergies  Allergen Reactions   Lisinopril Cough   Metoprolol Rash and Other (See Comments)      Outpatient Encounter Medications as of 07/20/2022  Medication Sig   acebutolol (SECTRAL) 200 MG capsule Take 1 capsule (200 mg total) by mouth daily.   acetaminophen (TYLENOL) 500 MG tablet Take 500-1,000 mg by mouth every 6 (six) hours as needed for moderate pain.   Acetylcysteine (NAC PO) Take 1 tablet by mouth 2 (two) times daily.   apixaban (ELIQUIS) 5 MG TABS tablet Take 1 tablet (5 mg total) by mouth 2 (two) times daily.   diltiazem (CARDIZEM CD) 120 MG 24 hr capsule Take 1 capsule (120 mg total) by mouth daily.   diltiazem (CARDIZEM) 30 MG tablet Take 1 tablet (30 mg total) by mouth every 8 (eight) hours as needed (palpations).   diphenhydrAMINE (BENADRYL) 25 MG tablet Take 25 mg by mouth daily as needed for itching.   furosemide (LASIX) 20 MG tablet Take 20 mg by mouth daily.   LORazepam (  ATIVAN) 1 MG tablet Take 1 mg by mouth every 8 (eight) hours as needed for anxiety or sleep.   melatonin 3 MG TABS tablet Take 3 mg by mouth at bedtime as needed (sleep).   Methylsulfonylmethane (MSM PO) Take 1 tablet by mouth every other day.   ondansetron (ZOFRAN) 4 MG tablet Take 4 mg by mouth every 8 (eight) hours as needed for nausea or vomiting.   sodium chloride (OCEAN) 0.65 % SOLN nasal spray Place 1 spray into both nostrils as needed for congestion.   triamcinolone cream (KENALOG) 0.1 % Apply 1 Application topically 2 (two) times daily as needed (psoriasis).   Vitamin D, Ergocalciferol, (DRISDOL) 1.25 MG (50000 UNIT) CAPS capsule Take 50,000 Units by mouth every 7 (seven) days.   amoxicillin (AMOXIL) 500 MG capsule TAKE 4 CAPSULES BY MOUTH ONE HOUR BEFORE DENTAL PROCEDURES (Patient not taking:  Reported on 07/20/2022)   amphetamine-dextroamphetamine (ADDERALL) 20 MG tablet Take 10-20 mg by mouth daily as needed (ADD and/or hypersomnolence).  (Patient not taking: Reported on 07/20/2022)   No facility-administered encounter medications on file as of 07/20/2022.     REVIEW OF SYSTEMS  : All other systems reviewed and negative except where noted in the History of Present Illness.   PHYSICAL EXAM: BP 110/80 (BP Location: Left Arm, Patient Position: Sitting, Cuff Size: Normal)   Pulse 88 Comment: irregular  Ht 5' 1.5" (1.562 m) Comment: height measured without shoes  Wt 134 lb 4 oz (60.9 kg)   LMP  (LMP Unknown)   BMI 24.96 kg/m  General: Well developed white female in no acute distress Head: Normocephalic and atraumatic Eyes:  Sclerae anicteric, conjunctiva pink. Ears: Normal auditory acuity Lungs: Clear throughout to auscultation; no W/R/R. Heart: Regular rate and rhythm; no M/R/G. Abdomen: Soft, non-distended.  BS present.  Non-tender. Musculoskeletal: Symmetrical with no gross deformities  Skin: No lesions on visible extremities Extremities: No edema  Neurological: Alert oriented x 4, grossly non-focal Psychological:  Alert and cooperative. Normal mood and affect  ASSESSMENT AND PLAN: *Fecal incontinence: Having issues with holding stool as well as urine and with passing flatus involuntarily.  Will begin Benefiber 2 teaspoons mixed in 8 ounces of liquid daily and increase to twice daily after a couple weeks if tolerated and needed.  Will refer to pelvic floor physical therapy. *Diverticulitis: Had an episode of sigmoid diverticulitis seen on CT scan in March.  Was treated with Levaquin and Flagyl for 10 days.  Colonoscopy April 2023 had only sigmoid diverticulosis noted on that exam with repeat recommended in 10 years.  Will place colonoscopy recall for April 2033 with Dr. Lavon Paganini.    **She will call back with any recurrent symptoms.  If she needs retreated for diverticulitis  in the future would recommend maybe treating with Augmentin instead.   CC:  Hortencia Conradi, NP

## 2022-07-20 NOTE — Patient Instructions (Signed)
We have referred you to pelvic floor therapy at [facility]. They will contact you with an appointment.  Start on over the counter Benefiber 2 tablespoons in 8 ounces of fluids daily. You can increase to twice daily after a few weeks if tolerated.    Follow up US needed.   You will be due for a recall colonoscopy in 06/2031. We will send you a reminder in the mail when it gets closer to that time.  The [facility] providers would like to encourage you to use [facility] to communicate with providers for non-urgent requests or questions.  Due to long hold times on the telephone, sending your provider a message by [facility] may be a faster and more efficient way to get a response.  Please allow 48 business hours for a response.  Please remember that this is for non-urgent requests.

## 2022-07-24 ENCOUNTER — Encounter: Payer: Self-pay | Admitting: Family Medicine

## 2022-07-24 ENCOUNTER — Ambulatory Visit (INDEPENDENT_AMBULATORY_CARE_PROVIDER_SITE_OTHER): Payer: PPO | Admitting: Family Medicine

## 2022-07-24 VITALS — BP 118/80 | HR 130 | Temp 98.2°F | Resp 14 | Ht 61.5 in | Wt 136.0 lb

## 2022-07-24 DIAGNOSIS — Z953 Presence of xenogenic heart valve: Secondary | ICD-10-CM

## 2022-07-24 DIAGNOSIS — Z853 Personal history of malignant neoplasm of breast: Secondary | ICD-10-CM | POA: Diagnosis not present

## 2022-07-24 DIAGNOSIS — I480 Paroxysmal atrial fibrillation: Secondary | ICD-10-CM

## 2022-07-24 DIAGNOSIS — I428 Other cardiomyopathies: Secondary | ICD-10-CM

## 2022-07-24 DIAGNOSIS — M791 Myalgia, unspecified site: Secondary | ICD-10-CM | POA: Diagnosis not present

## 2022-07-24 DIAGNOSIS — R400 Somnolence: Secondary | ICD-10-CM

## 2022-07-24 DIAGNOSIS — E782 Mixed hyperlipidemia: Secondary | ICD-10-CM

## 2022-07-24 DIAGNOSIS — E1159 Type 2 diabetes mellitus with other circulatory complications: Secondary | ICD-10-CM

## 2022-07-24 DIAGNOSIS — I152 Hypertension secondary to endocrine disorders: Secondary | ICD-10-CM | POA: Diagnosis not present

## 2022-07-24 DIAGNOSIS — I10 Essential (primary) hypertension: Secondary | ICD-10-CM

## 2022-07-24 DIAGNOSIS — I340 Nonrheumatic mitral (valve) insufficiency: Secondary | ICD-10-CM | POA: Diagnosis not present

## 2022-07-24 MED ORDER — RAMELTEON 8 MG PO TABS
8.0000 mg | ORAL_TABLET | Freq: Every day | ORAL | 5 refills | Status: DC
Start: 2022-07-24 — End: 2022-08-16

## 2022-07-24 NOTE — Progress Notes (Signed)
New Patient Office Visit  Subjective    Patient ID: Lindsay Guerrero, female    DOB: July 07, 1955  Age: 67 y.o. MRN: [id number]  CC:  Chief Complaint  Patient presents with   New Patient (Initial Visit)    HPI GARNETTE MOLLISON presents to establish care with PCP.  Patient is a retired Tour manager.  Afib: She is taking Eliquis 5 mg BID, Diltiazem 120 mg daily and 30 mg as needed. She sees Dr Kirtland Bouchard due to afib. She had an ablation on 07/05/2022. Has had a fib for years.   ADHD: She has been on Adderall 10-20 mg daily, but she is not taking it due to afib.  Hypertension: Takes acebutolol 200 mg daily, furosemide 20 mg daily.  Patient has a history of aortic valve regurgitation which required a aortic valve replacement with a bioprosthetic valve (21 mm Edwards Inspira's Resilia.)  Patient requires amoxicillin prophylaxis for any dental procedures.  GAD: Currently on lorazepam 1 mg 1 p.o. 3 times daily as needed.  Patient does not take this very much.  Vitamin D deficiency: Currently on vitamin D 50 K weekly.  Patient is complaining of difficulty with insomnia.  She takes melatonin which does not really help much.  Patient suffers from restless leg syndrome and has had a borderline sleep study in the past however it was quite a long time ago.  Diabetes: Looking back she has had an A1c up to 7.1 in April 2021.  Patient does not check her sugars.  Patient does try to eat healthy. Outpatient Encounter Medications as of 07/24/2022  Medication Sig   acebutolol (SECTRAL) 200 MG capsule Take 1 capsule (200 mg total) by mouth daily.   acetaminophen (TYLENOL) 500 MG tablet Take 500-1,000 mg by mouth every 6 (six) hours as needed for moderate pain.   Acetylcysteine (NAC PO) Take 1 tablet by mouth 2 (two) times daily.   amoxicillin (AMOXIL) 500 MG capsule TAKE 4 CAPSULES BY MOUTH ONE HOUR BEFORE DENTAL PROCEDURES   apixaban (ELIQUIS) 5 MG TABS tablet Take 1 tablet (5 mg total) by mouth 2 (two) times  daily.   diltiazem (CARDIZEM CD) 120 MG 24 hr capsule Take 1 capsule (120 mg total) by mouth daily.   diltiazem (CARDIZEM) 30 MG tablet Take 1 tablet (30 mg total) by mouth every 8 (eight) hours as needed (palpations).   diphenhydrAMINE (BENADRYL) 25 MG tablet Take 25 mg by mouth daily as needed for itching.   furosemide (LASIX) 20 MG tablet Take 20 mg by mouth daily.   LORazepam (ATIVAN) 1 MG tablet Take 1 mg by mouth every 8 (eight) hours as needed for anxiety or sleep.   melatonin 3 MG TABS tablet Take 3 mg by mouth at bedtime as needed (sleep).   Methylsulfonylmethane (MSM PO) Take 1 tablet by mouth every other day.   ondansetron (ZOFRAN) 4 MG tablet Take 4 mg by mouth every 8 (eight) hours as needed for nausea or vomiting.   ramelteon (ROZEREM) 8 MG tablet Take 1 tablet (8 mg total) by mouth at bedtime.   sodium chloride (OCEAN) 0.65 % SOLN nasal spray Place 1 spray into both nostrils as needed for congestion.   triamcinolone cream (KENALOG) 0.1 % Apply 1 Application topically 2 (two) times daily as needed (psoriasis).   Vitamin D, Ergocalciferol, (DRISDOL) 1.25 MG (50000 UNIT) CAPS capsule Take 50,000 Units by mouth every 7 (seven) days.   [DISCONTINUED] amphetamine-dextroamphetamine (ADDERALL) 20 MG tablet Take 10-20 mg by mouth  daily as needed (ADD and/or hypersomnolence).   No facility-administered encounter medications on file as of 07/24/2022.    Past Medical History:  Diagnosis Date   Adult attention deficit disorder 05/15/2013   Anxiety state 11/19/2012   ICD10   Aortic stenosis 12/13/2017   Moderate by echocardiogram from 2019 mean gradient 25   Aortic stenosis, mild 07/13/2016   Moderate by echocardiogram from 2019 mean gradient 25   Aortic stenosis, severe 07/02/2019   Aortic valve sclerosis 05/24/2015   Formatting of this note might be different from the original. Without stenosis. Last Echo 2012   Asthma    mild   Atrial fibrillation (HCC) 06/28/2018   Atrial  fibrillation with RVR (HCC) 06/28/2018   CHF (congestive heart failure) (HCC)    Chronic bilateral low back pain 01/27/2015   Depression    Diabetes mellitus without complication (HCC)    borderline   Dyspnea    on exertion   Dyspnea on exertion 11/01/2017   Elective surgery for purposes other than treating health conditions 09/27/2010   ICD10   Essential hypertension 06/30/2018   Family history of colon cancer 03/01/2005   Family history of premature coronary artery disease 03/01/2005   GAD (generalized anxiety disorder) 03/01/2005   Formatting of this note might be different from the original. with panic attacks   Hepatitis    due to cytomegalovirus-resolved   History of breast cancer 06/30/2018   History of hiatal hernia    mild,found on a CT   History of malignant neoplasm of breast 03/01/2005   Formatting of this note might be different from the original. node neg; see chartnotes for treatment   HTN (hypertension), benign 07/13/2016   Hyperlipidemia 06/30/2018   Hypokalemia 06/30/2018   Inflamed seborrheic keratosis 08/08/2012   Irritable bowel syndrome 03/01/2005   Malignant neoplasm of upper-inner quadrant of female breast (HCC) 2003   Primary  diagnosis   Mild intermittent asthma without complication 03/01/2005   Formatting of this note might be different from the original. mild   Mitral regurgitation 12/13/2017   Mild to moderate based on echo from 2019   Mixed hyperlipidemia 07/20/2019   NICM (nonischemic cardiomyopathy) (HCC) 11/01/2015   Osteopenia 01/27/2015   Paroxysmal atrial fibrillation (HCC) 07/14/2019   Pneumonia 2018   Post menopausal problems 01/27/2015   Precordial pain 11/01/2015   Psoriatic arthritis (HCC) 04/25/2015   Recurrent genital HSV (herpes simplex virus) infection 03/01/2005   Formatting of this note might be different from the original. HSV genital   Restless legs syndrome 03/01/2005   Rosacea 03/01/2005   Routine history and physical  examination of adult 09/22/2012   Formatting of this note might be different from the original. PROBLEM LIST:  1. Left node negative breast cancer 2002, stage I, grade 1, status post  lumpectomy and node dissection, external beam radiation,  chemotherapy/Cytoxan, mild left arm lymphedema with cellulitis 2004.  2. Anxiety disorder with panic attacks, family history of same, globus  pharyngeus, palpitations, atypical chest pain, str   S/P aortic valve replacement with bioprosthetic valve 21 mm Edwards INSPIRIS RESILIA  07/02/2019   Size 21 mm   S/P AVR (aortic valve replacement) 07/20/2019   Seborrheic keratoses 09/10/2016   Sleep apnea    borderline   Vaginal dryness 01/27/2015    Past Surgical History:  Procedure Laterality Date   AORTIC VALVE REPLACEMENT N/A 07/02/2019   Procedure: AORTIC VALVE REPLACEMENT (AVR) USING INSPIRIS VALVE SIZE ;  Surgeon: Alleen Borne, MD;  Location:  [facility] OR;  Service: Open Heart Surgery;  Laterality: N/A;   ATRIAL FIBRILLATION ABLATION N/A 07/05/2022   Procedure: ATRIAL FIBRILLATION ABLATION;  Surgeon: Regan Lemming, MD;  Location: [facility] INVASIVE CV LAB;  Service: Cardiovascular;  Laterality: N/A;   BREAST LUMPECTOMY Left 2003   sentinal node bx.   CARDIOVERSION N/A 07/10/2019   Procedure: CARDIOVERSION;  Surgeon: Little Ishikawa, MD;  Location: [facility] ENDOSCOPY;  Service: Cardiovascular;  Laterality: N/A;   CESAREAN SECTION     CLIPPING OF ATRIAL APPENDAGE N/A 07/02/2019   Procedure: CLIPPING OF ATRIAL APPENDAGE USING PRO2 CLIP SIZE ;  Surgeon: Alleen Borne, MD;  Location: [facility] OR;  Service: Open Heart Surgery;  Laterality: N/A;   COSMETIC SURGERY     IR THORACENTESIS ASP PLEURAL SPACE W/IMG GUIDE  07/07/2019   RIGHT/LEFT HEART CATH AND CORONARY ANGIOGRAPHY N/A 05/20/2019   Procedure: RIGHT/LEFT HEART CATH AND CORONARY ANGIOGRAPHY;  Surgeon: Tonny Bollman, MD;  Location: [facility] INVASIVE CV LAB;  Service: Cardiovascular;  Laterality: N/A;   TEE  WITHOUT CARDIOVERSION N/A 07/02/2019   Procedure: TRANSESOPHAGEAL ECHOCARDIOGRAM (TEE);  Surgeon: Alleen Borne, MD;  Location: [facility] OR;  Service: Open Heart Surgery;  Laterality: N/A;   TONSILLECTOMY      Family History  Problem Relation Age of Onset   Heart Problems Mother    Hypertension Mother    Heart disease Mother    Heart Problems Father    Heart disease Father    Heart disease Sister    Heart attack Sister    Blindness Daughter    Diabetes Daughter    Developmental delay Daughter    Colon cancer Maternal Grandmother    Heart disease Maternal Grandmother    AAA (abdominal aortic aneurysm) Paternal Grandfather     Social History   Socioeconomic History   Marital status: Married    Spouse name: Not on file   Number of children: 5   Years of education: Not on file   Highest education level: Associate degree: academic program  Occupational History   Occupation: retired  Tobacco Use   Smoking status: Never   Smokeless tobacco: Never  Building services engineer Use: Never used  Substance and Sexual Activity   Alcohol use: Yes    Comment: occasional social drinking (once per month)    Drug use: Yes    Types: Marijuana    Comment: smoked college years, chew THC gummies   Sexual activity: Yes    Partners: Male    Birth control/protection: None  Other Topics Concern   Not on file  Social History Narrative   Not on file   Social Determinants of Health   Financial Resource Strain: Low Risk  (07/19/2022)   Overall Financial Resource Strain (CARDIA)    Difficulty of Paying Living Expenses: Not very hard  Food Insecurity: No Food Insecurity (07/19/2022)   Hunger Vital Sign    Worried About Running Out of Food in the Last Year: Never true    Ran Out of Food in the Last Year: Never true  Transportation Needs: No Transportation Needs (07/19/2022)   PRAPARE - Administrator, Civil Service (Medical): No    Lack of Transportation (Non-Medical): No  Physical Activity:  Sufficiently Active (07/19/2022)   Exercise Vital Sign    Days of Exercise per Week: 5 days    Minutes of Exercise per Session: 30 min  Stress: Stress Concern Present (07/19/2022)   [employer] of Occupational Health - Occupational Stress  Questionnaire    Feeling of Stress : To some extent  Social Connections: Socially Integrated (07/19/2022)   Social Connection and Isolation Panel [NHANES]    Frequency of Communication with Friends and Family: More than three times a week    Frequency of Social Gatherings with Friends and Family: Twice a week    Attends Religious Services: More than 4 times per year    Active Member of [employer] or Organizations: Yes    Attends Engineer, structural: More than 4 times per year    Marital Status: Married  Catering manager Violence: Not on file    Review of Systems  Constitutional:  Negative for chills and fever.  HENT:  Negative for congestion, ear pain, sinus pain and sore throat.   Eyes:  Negative for blurred vision.  Respiratory:  Negative for cough and shortness of breath.   Cardiovascular:  Negative for chest pain.  Gastrointestinal:  Negative for abdominal pain, constipation, diarrhea, heartburn and vomiting.  Genitourinary:  Negative for dysuria.  Musculoskeletal:  Negative for back pain and myalgias.  Neurological:  Negative for dizziness and weakness.  Psychiatric/Behavioral:  The patient is nervous/anxious.         Objective    BP 118/80   Pulse (!) 130   Temp 98.2 F (36.8 C)   Resp 14   Ht 5' 1.5" (1.562 m)   Wt 136 lb (61.7 kg)   LMP  (LMP Unknown)   SpO2 98%   BMI 25.28 kg/m   Physical Exam      Assessment & Plan:   Problem List Items Addressed This Visit       Cardiovascular and Mediastinum   NICM (nonischemic cardiomyopathy) (HCC)    Management per specialist.        Mitral regurgitation    Management per specialist.        Hypertension associated with diabetes (HCC)    Hypertension is  well-controlled.  Diabetic control: Unknown. Recommend check feet daily. Recommend annual eye exams. Medicines: None.   Continue to work on eating a healthy diet and exercise.  Return for lab work.      Relevant Orders   CBC with Differential/Platelet   Comprehensive metabolic panel   Hemoglobin A1c   Microalbumin / creatinine urine ratio   RESOLVED: Paroxysmal atrial fibrillation (HCC)     Other   History of malignant neoplasm of breast   S/P aortic valve replacement with bioprosthetic valve 21 mm Edwards INSPIRIS RESILIA    Mixed hyperlipidemia    Return for fasting lab work. Assess at that point make recommendations.      Relevant Orders   Lipid panel   TSH   Daytime somnolence - Primary    Order home sleep study.      Relevant Orders   Home sleep test   Myalgia    Check chemistry panel, magnesium, phosphorus and TSH.      Relevant Orders   Phosphorus   Magnesium    No follow-ups on file.   Blane Ohara, MD

## 2022-07-29 DIAGNOSIS — M791 Myalgia, unspecified site: Secondary | ICD-10-CM | POA: Insufficient documentation

## 2022-07-29 DIAGNOSIS — R400 Somnolence: Secondary | ICD-10-CM | POA: Insufficient documentation

## 2022-07-29 NOTE — Assessment & Plan Note (Signed)
Check chemistry panel, magnesium, phosphorus and TSH.

## 2022-07-29 NOTE — Assessment & Plan Note (Addendum)
Management per specialist. Appears to be in normal sinus rhythm on exam.  Continue Eliquis, diltiazem.  Has diltiazem immediate release 30 mg daily as needed. Also is on Acebutolol 200 mg daily.

## 2022-07-29 NOTE — Assessment & Plan Note (Signed)
Management per specialist. 

## 2022-07-29 NOTE — Assessment & Plan Note (Addendum)
Return for fasting lab work. Assess at that point make recommendations.

## 2022-07-29 NOTE — Assessment & Plan Note (Signed)
Order home sleep study  

## 2022-07-29 NOTE — Assessment & Plan Note (Addendum)
Hypertension is well-controlled.  Diabetic control: Unknown. Recommend check feet daily. Recommend annual eye exams. Medicines: None.   Continue to work on eating a healthy diet and exercise.  Return for lab work.

## 2022-07-31 ENCOUNTER — Other Ambulatory Visit: Payer: Self-pay | Admitting: Family Medicine

## 2022-07-31 ENCOUNTER — Ambulatory Visit (INDEPENDENT_AMBULATORY_CARE_PROVIDER_SITE_OTHER): Payer: PPO

## 2022-07-31 VITALS — BP 112/78 | HR 85 | Resp 16 | Ht 62.0 in | Wt 134.0 lb

## 2022-07-31 DIAGNOSIS — Z0000 Encounter for general adult medical examination without abnormal findings: Secondary | ICD-10-CM | POA: Diagnosis not present

## 2022-07-31 DIAGNOSIS — I152 Hypertension secondary to endocrine disorders: Secondary | ICD-10-CM | POA: Diagnosis not present

## 2022-07-31 DIAGNOSIS — M791 Myalgia, unspecified site: Secondary | ICD-10-CM | POA: Diagnosis not present

## 2022-07-31 DIAGNOSIS — Z1231 Encounter for screening mammogram for malignant neoplasm of breast: Secondary | ICD-10-CM | POA: Diagnosis not present

## 2022-07-31 DIAGNOSIS — E1159 Type 2 diabetes mellitus with other circulatory complications: Secondary | ICD-10-CM | POA: Diagnosis not present

## 2022-07-31 DIAGNOSIS — E782 Mixed hyperlipidemia: Secondary | ICD-10-CM

## 2022-07-31 NOTE — Progress Notes (Signed)
Subjective:   Lindsay Guerrero is a 67 y.o. female who presents for Medicare Annual (Subsequent) preventive examination.  This wellness visit is conducted by a nurse.  The patient's medications were reviewed and reconciled since the patient's last visit.  History details were provided by the patient.  The history appears to be reliable.    Medical History: Patient history and Family history was reviewed  Medications, Allergies, and preventative health maintenance was reviewed and updated.  Cardiac Risk Factors include: advanced age (>33men, >93 women);diabetes mellitus;dyslipidemia     Objective:    Today's Vitals   07/30/22 0946  BP: 112/78  Pulse: 85  Resp: 16  SpO2: 95%  Weight: 134 lb (60.8 kg)  Height: 5\' 2"  (1.575 m)  PainSc: 0-No pain   Body mass index is 24.51 kg/m.     07/05/2022    5:50 AM 07/06/2019   12:00 PM 06/30/2019   11:51 AM 05/20/2019    6:56 AM 06/28/2018    9:33 PM  Advanced Directives  Does Patient Have a Medical Advance Directive? No No No No No  Would patient like information on creating a medical advance directive? Yes (MAU/Ambulatory/Procedural Areas - Information given) No - Patient declined No - Patient declined No - Patient declined No - Patient declined    Current Medications (verified) Outpatient Encounter Medications as of 07/31/2022  Medication Sig   acebutolol (SECTRAL) 200 MG capsule Take 1 capsule (200 mg total) by mouth daily.   acetaminophen (TYLENOL) 500 MG tablet Take 500-1,000 mg by mouth every 6 (six) hours as needed for moderate pain.   Acetylcysteine (NAC PO) Take 1 tablet by mouth 2 (two) times daily.   amoxicillin (AMOXIL) 500 MG capsule TAKE 4 CAPSULES BY MOUTH ONE HOUR BEFORE DENTAL PROCEDURES   apixaban (ELIQUIS) 5 MG TABS tablet Take 1 tablet (5 mg total) by mouth 2 (two) times daily.   diltiazem (CARDIZEM CD) 120 MG 24 hr capsule Take 1 capsule (120 mg total) by mouth daily.   diltiazem (CARDIZEM) 30 MG tablet Take 1 tablet  (30 mg total) by mouth every 8 (eight) hours as needed (palpations).   diphenhydrAMINE (BENADRYL) 25 MG tablet Take 25 mg by mouth daily as needed for itching.   furosemide (LASIX) 20 MG tablet Take 20 mg by mouth daily.   LORazepam (ATIVAN) 1 MG tablet Take 1 mg by mouth every 8 (eight) hours as needed for anxiety or sleep.   melatonin 3 MG TABS tablet Take 3 mg by mouth at bedtime as needed (sleep).   Methylsulfonylmethane (MSM PO) Take 1 tablet by mouth every other day.   ondansetron (ZOFRAN) 4 MG tablet Take 4 mg by mouth every 8 (eight) hours as needed for nausea or vomiting.   ramelteon (ROZEREM) 8 MG tablet Take 1 tablet (8 mg total) by mouth at bedtime.   sodium chloride (OCEAN) 0.65 % SOLN nasal spray Place 1 spray into both nostrils as needed for congestion.   triamcinolone cream (KENALOG) 0.1 % Apply 1 Application topically 2 (two) times daily as needed (psoriasis).   Vitamin D, Ergocalciferol, (DRISDOL) 1.25 MG (50000 UNIT) CAPS capsule Take 50,000 Units by mouth every 7 (seven) days.   No facility-administered encounter medications on file as of 07/31/2022.    Allergies (verified) Lisinopril and Metoprolol   History: Past Medical History:  Diagnosis Date   Adult attention deficit disorder 05/15/2013   Anxiety state 11/19/2012   ICD10   Aortic stenosis 12/13/2017   Moderate by echocardiogram  from 2019 mean gradient 25   Aortic stenosis, mild 07/13/2016   Moderate by echocardiogram from 2019 mean gradient 25   Aortic stenosis, severe 07/02/2019   Aortic valve sclerosis 05/24/2015   Formatting of this note might be different from the original. Without stenosis. Last Echo 2012   Asthma    mild   Atrial fibrillation (HCC) 06/28/2018   Atrial fibrillation with RVR (HCC) 06/28/2018   CHF (congestive heart failure) (HCC)    Chronic bilateral low back pain 01/27/2015   Depression    Diabetes mellitus without complication (HCC)    borderline   Dyspnea    on exertion    Dyspnea on exertion 11/01/2017   Elective surgery for purposes other than treating health conditions 09/27/2010   ICD10   Essential hypertension 06/30/2018   Family history of colon cancer 03/01/2005   Family history of premature coronary artery disease 03/01/2005   GAD (generalized anxiety disorder) 03/01/2005   Formatting of this note might be different from the original. with panic attacks   Hepatitis    due to cytomegalovirus-resolved   History of breast cancer 06/30/2018   History of hiatal hernia    mild,found on a CT   History of malignant neoplasm of breast 03/01/2005   Formatting of this note might be different from the original. node neg; see chartnotes for treatment   HTN (hypertension), benign 07/13/2016   Hyperlipidemia 06/30/2018   Hypokalemia 06/30/2018   Inflamed seborrheic keratosis 08/08/2012   Irritable bowel syndrome 03/01/2005   Malignant neoplasm of upper-inner quadrant of female breast (HCC) 2003   Primary  diagnosis   Mild intermittent asthma without complication 03/01/2005   Formatting of this note might be different from the original. mild   Mitral regurgitation 12/13/2017   Mild to moderate based on echo from 2019   Mixed hyperlipidemia 07/20/2019   NICM (nonischemic cardiomyopathy) (HCC) 11/01/2015   Osteopenia 01/27/2015   Paroxysmal atrial fibrillation (HCC) 07/14/2019   Pneumonia 2018   Post menopausal problems 01/27/2015   Precordial pain 11/01/2015   Psoriatic arthritis (HCC) 04/25/2015   Recurrent genital HSV (herpes simplex virus) infection 03/01/2005   Formatting of this note might be different from the original. HSV genital   Restless legs syndrome 03/01/2005   Rosacea 03/01/2005   Routine history and physical examination of adult 09/22/2012   Formatting of this note might be different from the original. PROBLEM LIST:  1. Left node negative breast cancer 2002, stage I, grade 1, status post  lumpectomy and node dissection, external beam  radiation,  chemotherapy/Cytoxan, mild left arm lymphedema with cellulitis 2004.  2. Anxiety disorder with panic attacks, family history of same, globus  pharyngeus, palpitations, atypical chest pain, str   S/P aortic valve replacement with bioprosthetic valve 21 mm Edwards INSPIRIS RESILIA  07/02/2019   Size 21 mm   S/P AVR (aortic valve replacement) 07/20/2019   Seborrheic keratoses 09/10/2016   Sleep apnea    borderline   Vaginal dryness 01/27/2015   Past Surgical History:  Procedure Laterality Date   AORTIC VALVE REPLACEMENT N/A 07/02/2019   Procedure: AORTIC VALVE REPLACEMENT (AVR) USING INSPIRIS VALVE SIZE ;  Surgeon: Alleen Borne, MD;  Location: [facility] OR;  Service: Open Heart Surgery;  Laterality: N/A;   ATRIAL FIBRILLATION ABLATION N/A 07/05/2022   Procedure: ATRIAL FIBRILLATION ABLATION;  Surgeon: Regan Lemming, MD;  Location: [facility] INVASIVE CV LAB;  Service: Cardiovascular;  Laterality: N/A;   BREAST LUMPECTOMY Left 2003   sentinal node bx.  CARDIOVERSION N/A 07/10/2019   Procedure: CARDIOVERSION;  Surgeon: Little Ishikawa, MD;  Location: [facility] ENDOSCOPY;  Service: Cardiovascular;  Laterality: N/A;   CESAREAN SECTION     CLIPPING OF ATRIAL APPENDAGE N/A 07/02/2019   Procedure: CLIPPING OF ATRIAL APPENDAGE USING PRO2 CLIP SIZE ;  Surgeon: Alleen Borne, MD;  Location: [facility] OR;  Service: Open Heart Surgery;  Laterality: N/A;   COSMETIC SURGERY     IR THORACENTESIS ASP PLEURAL SPACE W/IMG GUIDE  07/07/2019   RIGHT/LEFT HEART CATH AND CORONARY ANGIOGRAPHY N/A 05/20/2019   Procedure: RIGHT/LEFT HEART CATH AND CORONARY ANGIOGRAPHY;  Surgeon: Tonny Bollman, MD;  Location: [facility] INVASIVE CV LAB;  Service: Cardiovascular;  Laterality: N/A;   TEE WITHOUT CARDIOVERSION N/A 07/02/2019   Procedure: TRANSESOPHAGEAL ECHOCARDIOGRAM (TEE);  Surgeon: Alleen Borne, MD;  Location: [facility] OR;  Service: Open Heart Surgery;  Laterality: N/A;   TONSILLECTOMY     Family History  Problem  Relation Age of Onset   Heart Problems Mother    Hypertension Mother    Heart disease Mother    Heart Problems Father    Heart disease Father    Heart disease Sister    Heart attack Sister    Blindness Daughter    Diabetes Daughter    Developmental delay Daughter    Colon cancer Maternal Grandmother    Heart disease Maternal Grandmother    AAA (abdominal aortic aneurysm) Paternal Grandfather    Social History   Socioeconomic History   Marital status: Married    Spouse name: Not on file   Number of children: 5   Years of education: Not on file   Highest education level: Associate degree: academic program  Occupational History   Occupation: retired  Tobacco Use   Smoking status: Never   Smokeless tobacco: Never  Building services engineer Use: Never used  Substance and Sexual Activity   Alcohol use: Yes    Comment: occasional social drinking (once per month)    Drug use: Yes    Types: Marijuana    Comment: smoked college years, chew THC gummies   Sexual activity: Yes    Partners: Male    Birth control/protection: None  Other Topics Concern   Not on file  Social History Narrative   Not on file   Social Determinants of Health   Financial Resource Strain: Low Risk  (07/30/2022)   Overall Financial Resource Strain (CARDIA)    Difficulty of Paying Living Expenses: Not very hard  Food Insecurity: No Food Insecurity (07/30/2022)   Hunger Vital Sign    Worried About Running Out of Food in the Last Year: Never true    Ran Out of Food in the Last Year: Never true  Transportation Needs: No Transportation Needs (07/30/2022)   PRAPARE - Administrator, Civil Service (Medical): No    Lack of Transportation (Non-Medical): No  Physical Activity: Sufficiently Active (07/30/2022)   Exercise Vital Sign    Days of Exercise per Week: 5 days    Minutes of Exercise per Session: 30 min  Stress: Stress Concern Present (07/30/2022)   [employer] of Occupational Health -  Occupational Stress Questionnaire    Feeling of Stress : To some extent  Social Connections: Socially Integrated (07/30/2022)   Social Connection and Isolation Panel [NHANES]    Frequency of Communication with Friends and Family: More than three times a week    Frequency of Social Gatherings with Friends and Family: More than three times  a week    Attends Religious Services: More than 4 times per year    Active Member of Clubs or Organizations: Yes    Attends Engineer, structural: More than 4 times per year    Marital Status: Married    Tobacco Counseling Counseling given: Not Answered   Clinical Intake:  Pre-visit preparation completed: Yes Pain : No/denies pain Pain Score: 0-No pain   BMI - recorded: 24.51 Nutritional Status: BMI of 19-24  Normal Nutritional Risks: None Diabetes: Yes CBG done?: No Did pt. bring in CBG monitor from home?: No How often do you need to have someone help you when you read instructions, pamphlets, or other written materials from your doctor or pharmacy?: 1 - Never Interpreter Needed?: No    Activities of Daily Living    07/30/2022    9:46 AM  In your present state of health, do you have any difficulty performing the following activities:  Hearing? 0  Vision? 0  Difficulty concentrating or making decisions? 0  Walking or climbing stairs? 0  Dressing or bathing? 0  Doing errands, shopping? 0  Preparing Food and eating ? N  Using the Toilet? N  In the past six months, have you accidently leaked urine? Y  Do you have problems with loss of bowel control? Y  Managing your Medications? N  Managing your Finances? N  Housekeeping or managing your Housekeeping? N    Patient Care Team: Blane Ohara, MD as PCP - General (Family Medicine) Georgeanna Lea, MD as PCP - Cardiology (Cardiology) Regan Lemming, MD as PCP - Electrophysiology (Cardiology)     Assessment:   This is a routine wellness examination for  Zamantha.  Hearing/Vision screen No results found.  Dietary issues and exercise activities discussed: Current Exercise Habits: Home exercise routine, Type of exercise: walking, Time (Minutes): 30, Frequency (Times/Week): 5, Weekly Exercise (Minutes/Week): 150, Intensity: Mild, Exercise limited by: None identified  Depression Screen    07/24/2022   10:57 AM  PHQ 2/9 Scores  PHQ - 2 Score 1  PHQ- 9 Score 7    Fall Risk    07/30/2022    9:46 AM  Fall Risk   Falls in the past year? 0  Number falls in past yr: 0  Injury with Fall? 0    FALL RISK PREVENTION PERTAINING TO THE HOME:  Any stairs in or around the home? Yes  If so, are there any without handrails? No  Home free of loose throw rugs in walkways, pet beds, electrical cords, etc? Yes  Adequate lighting in your home to reduce risk of falls? Yes   ASSISTIVE DEVICES UTILIZED TO PREVENT FALLS:  Life alert? No  Use of a cane, walker or w/c? No  Gait steady and fast without use of assistive device  Cognitive Function:        Immunizations Immunization History  Administered Date(s) Administered   H1N1 04/14/2008   Influenza Split 01/03/2015   Influenza, Seasonal, Injecte, Preservative Fre 04/26/2009   Influenza,inj,Quad PF,6+ Mos 02/03/2013   Influenza-Unspecified 01/03/2006, 04/26/2009    TDAP status: Due, Education has been provided regarding the importance of this vaccine. Advised may receive this vaccine at local pharmacy or Health Dept. Aware to provide a copy of the vaccination record if obtained from local pharmacy or Health Dept. Verbalized acceptance and understanding.  Flu Vaccine status: Declined, Education has been provided regarding the importance of this vaccine but patient still declined. Advised may receive this vaccine at local  pharmacy or Health Dept. Aware to provide a copy of the vaccination record if obtained from local pharmacy or Health Dept. Verbalized acceptance and understanding.  Pneumococcal  vaccine status: Declined,  Education has been provided regarding the importance of this vaccine but patient still declined. Advised may receive this vaccine at local pharmacy or Health Dept. Aware to provide a copy of the vaccination record if obtained from local pharmacy or Health Dept. Verbalized acceptance and understanding.   Covid-19 vaccine status: Declined, Education has been provided regarding the importance of this vaccine but patient still declined. Advised may receive this vaccine at local pharmacy or Health Dept.or vaccine clinic. Aware to provide a copy of the vaccination record if obtained from local pharmacy or Health Dept. Verbalized acceptance and understanding.  Qualifies for Shingles Vaccine? Yes   Zostavax completed No   Shingrix Completed?: No.    Education has been provided regarding the importance of this vaccine. Patient has been advised to call insurance company to determine out of pocket expense if they have not yet received this vaccine. Advised may also receive vaccine at local pharmacy or Health Dept. Verbalized acceptance and understanding.  Screening Tests Health Maintenance  Topic Date Due   Medicare Annual Wellness (AWV)  Never done   COVID-19 Vaccine (1) Never done   FOOT EXAM  Never done   OPHTHALMOLOGY EXAM  Never done   Diabetic kidney evaluation - Urine ACR  Never done   Hepatitis C Screening  Never done   DTaP/Tdap/Td (1 - Tdap) Never done   Zoster Vaccines- Shingrix (1 of 2) Never done   COLONOSCOPY (Pts 45-65yrs Insurance coverage will need to be confirmed)  Never done   MAMMOGRAM  Never done   Pneumonia Vaccine 89+ Years old (1 of 1 - PCV) Never done   DEXA SCAN  Never done   HEMOGLOBIN A1C  12/30/2019   INFLUENZA VACCINE  10/11/2022   Diabetic kidney evaluation - eGFR measurement  06/15/2023   HPV VACCINES  Aged Out    Health Maintenance  Health Maintenance Due  Topic Date Due   Medicare Annual Wellness (AWV)  Never done   COVID-19 Vaccine  (1) Never done   FOOT EXAM  Never done   OPHTHALMOLOGY EXAM  Never done   Diabetic kidney evaluation - Urine ACR  Never done   Hepatitis C Screening  Never done   DTaP/Tdap/Td (1 - Tdap) Never done   Zoster Vaccines- Shingrix (1 of 2) Never done   COLONOSCOPY (Pts 45-58yrs Insurance coverage will need to be confirmed)  Never done   MAMMOGRAM  Never done   Pneumonia Vaccine 80+ Years old (1 of 1 - PCV) Never done   DEXA SCAN  Never done   HEMOGLOBIN A1C  12/30/2019    Colorectal cancer screening: Type of screening: Colonoscopy. Completed 06/27/2021. Repeat every 10 years  Mammogram status: Ordered   Lung Cancer Screening: (Low Dose CT Chest recommended if Age 79-80 years, 30 pack-year currently smoking OR have quit w/in 15years.) does not qualify.   Lung Cancer Screening Referral: N/A  Additional Screening:  Vision Screening: Recommended annual ophthalmology exams for early detection of glaucoma and other disorders of the eye. Is the patient up to date with their annual eye exam?  Yes  Who is the provider or what is the name of the office in which the patient attends annual eye exams? Dr Dory Larsen  Dental Screening: Recommended annual dental exams for proper oral hygiene  Community Resource Referral /  Chronic Care Management: CRR required this visit?  No   CCM required this visit?  No      Plan:    1- Vaccines declined 2- DEXA declined 3- Screening mammogram ordered 4- Diabetic eye exam records requested 5- Colonoscopy results abstracted  I have personally reviewed and noted the following in the patient's chart:   Medical and social history Use of alcohol, tobacco or illicit drugs  Current medications and supplements including opioid prescriptions.  Functional ability and status Nutritional status Physical activity Advanced directives List of other physicians Hospitalizations, surgeries, and ER visits in previous 12 months Vitals Screenings to include  cognitive, depression, and falls Referrals and appointments  In addition, I have reviewed and discussed with patient certain preventive protocols, quality metrics, and best practice recommendations. A written personalized care plan for preventive services as well as general preventive health recommendations were provided to patient.     Jacklynn Bue, LPN   1/61/0960

## 2022-07-31 NOTE — Patient Instructions (Signed)
Lindsay Guerrero , Thank you for taking time to come for your Medicare Wellness Visit. I appreciate your ongoing commitment to your health goals. Please review the following plan we discussed and let me know if I can assist you in the future.   Screening recommendations/referrals: Colonoscopy: Due 06/2031 Mammogram: Ordered Bone Density: Due, please let us know if you would like to order Recommended yearly ophthalmology/optometry visit for glaucoma screening and checkup Recommended yearly dental visit for hygiene and checkup  Vaccinations: Influenza vaccine: Due yearly in the fall Pneumococcal vaccine: Due Tdap vaccine: Due - you can get this at the pharmacy Shingles vaccine: Due - you can get this at the pharmacy     Preventive Care 65 Years and Older, Female   Preventive care refers to lifestyle choices and visits with your health care provider that can promote health and wellness.  What does preventive care include? A yearly physical exam. This is also called an annual well check. Dental exams once or twice a year. Routine eye exams. Ask your health care provider how often you should have your eyes checked. Personal lifestyle choices, including: Daily care of your teeth and gums. Regular physical activity. Eating a healthy diet. Avoiding tobacco and drug use. Limiting alcohol use. Practicing safe sex. Taking low-dose aspirin every day. Taking vitamin and mineral supplements as recommended by your health care provider.  What happens during an annual well check? The services and screenings done by your health care provider during your annual well check will depend on your age, overall health, lifestyle risk factors, and family history of disease.  Counseling Your health care provider may ask you questions about your: Alcohol use. Tobacco use. Drug use. Emotional well-being. Home and relationship well-being. Sexual activity. Eating habits. History of falls. Memory and  ability to understand (cognition). Work and work Astronomer. Reproductive health.  Screening You may have the following tests or measurements: Height, weight, and BMI. Blood pressure. Lipid and cholesterol levels. These may be checked every 5 years, or more frequently if you are over 70 years old. Skin check. Lung cancer screening. You may have this screening every year starting at age 1 if you have a 30-pack-year history of smoking and currently smoke or have quit within the past 15 years. Fecal occult blood test (FOBT) of the stool. You may have this test every year starting at age 37. Flexible sigmoidoscopy or colonoscopy. You may have a sigmoidoscopy every 5 years or a colonoscopy every 10 years starting at age 57. Hepatitis C blood test. Hepatitis B blood test. Sexually transmitted disease (STD) testing. Diabetes screening. This is done by checking your blood sugar (glucose) after you have not eaten for a while (fasting). You may have this done every 1-3 years. Bone density scan. This is done to screen for osteoporosis. You may have this done starting at age 37. Mammogram. This may be done every 1-2 years. Talk to your health care provider about how often you should have regular mammograms. Talk with your health care provider about your test results, treatment options, and if necessary, the need for more tests.  Vaccines Your health care provider may recommend certain vaccines, such as: Influenza vaccine. This is recommended every year. Tetanus, diphtheria, and acellular pertussis (Tdap, Td) vaccine. You may need a Td booster every 10 years. Zoster vaccine. You may need this after age 29. Pneumococcal 13-valent conjugate (PCV13) vaccine. One dose is recommended after age 40. Pneumococcal polysaccharide (PPSV23) vaccine. One dose is recommended after age 28.  Talk to your health care provider about which screenings and vaccines you need and how often you need them.  This information  is not intended to replace advice given to you by your health care provider. Make sure you discuss any questions you have with your health care provider. Document Released: 03/25/2015 Document Revised: 11/16/2015 Document Reviewed: 12/28/2014 Elsevier Interactive Patient Education  2017 [employer].   Fall Prevention in the Home  Falls can cause injuries. They can happen to people of all ages. There are many things you can do to make your home safe and to help prevent falls.  What can I do on the outside of my home? Regularly fix the edges of walkways and driveways and fix any cracks. Remove anything that might make you trip as you walk through a door, such as a raised step or threshold. Trim any bushes or trees on the path to your home. Use bright outdoor lighting. Clear any walking paths of anything that might make someone trip, such as rocks or tools. Regularly check to see if handrails are loose or broken. Make sure that both sides of any steps have handrails. Any raised decks and porches should have guardrails on the edges. Have any leaves, snow, or ice cleared regularly. Use sand or salt on walking paths during winter. Clean up any spills in your garage right away. This includes oil or grease spills.  What can I do in the bathroom? Use night lights. Install grab bars by the toilet and in the tub and shower. Do not use towel bars as grab bars. Use non-skid mats or decals in the tub or shower. If you need to sit down in the shower, use a plastic, non-slip stool. Keep the floor dry. Clean up any water that spills on the floor as soon as it happens. Remove soap buildup in the tub or shower regularly. Attach bath mats securely with double-sided non-slip rug tape. Do not have throw rugs and other things on the floor that can make you trip.  What can I do in the bedroom? Use night lights. Make sure that you have a light by your bed that is easy to reach. Do not use any sheets or  blankets that are too big for your bed. They should not hang down onto the floor. Have a firm chair that has side arms. You can use this for support while you get dressed. Do not have throw rugs and other things on the floor that can make you trip.  What can I do in the kitchen? Clean up any spills right away. Avoid walking on wet floors. Keep items that you use a lot in easy-to-reach places. If you need to reach something above you, use a strong step stool that has a grab bar. Keep electrical cords out of the way. Do not use floor polish or wax that makes floors slippery. If you must use wax, use non-skid floor wax. Do not have throw rugs and other things on the floor that can make you trip.  What can I do with my stairs? Do not leave any items on the stairs. Make sure that there are handrails on both sides of the stairs and use them. Fix handrails that are broken or loose. Make sure that handrails are as long as the stairways. Check any carpeting to make sure that it is firmly attached to the stairs. Fix any carpet that is loose or worn. Avoid having throw rugs at the top or bottom  of the stairs. If you do have throw rugs, attach them to the floor with carpet tape. Make sure that you have a light switch at the top of the stairs and the bottom of the stairs. If you do not have them, ask someone to add them for you.  What else can I do to help prevent falls? Wear shoes that: Do not have high heels. Have rubber bottoms. Are comfortable and fit you well. Are closed at the toe. Do not wear sandals. If you use a stepladder: Make sure that it is fully opened. Do not climb a closed stepladder. Make sure that both sides of the stepladder are locked into place. Ask someone to hold it for you, if possible. Clearly mark and make sure that you can see: Any grab bars or handrails. First and last steps. Where the edge of each step is. Use tools that help you move around (mobility aids) if they  are needed. These include: Canes. Walkers. Scooters. Crutches. Turn on the lights when you go into a dark area. Replace any light bulbs as soon as they burn out. Set up your furniture so you have a clear path. Avoid moving your furniture around. If any of your floors are uneven, fix them. If there are any pets around you, be aware of where they are. Review your medicines with your doctor. Some medicines can make you feel dizzy. This can increase your chance of falling. Ask your doctor what other things that you can do to help prevent falls.  This information is not intended to replace advice given to you by your health care provider. Make sure you discuss any questions you have with your health care provider. Document Released: 12/23/2008 Document Revised: 08/04/2015 Document Reviewed: 04/02/2014 Elsevier Interactive Patient Education  2017 [employer].

## 2022-08-01 LAB — CBC WITH DIFFERENTIAL/PLATELET
Basophils Absolute: 0 10*3/uL (ref 0.0–0.2)
Basos: 1 %
EOS (ABSOLUTE): 0.1 10*3/uL (ref 0.0–0.4)
Eos: 1 %
Hematocrit: 38.1 % (ref 34.0–46.6)
Hemoglobin: 12.6 g/dL (ref 11.1–15.9)
Immature Grans (Abs): 0 10*3/uL (ref 0.0–0.1)
Immature Granulocytes: 0 %
Lymphocytes Absolute: 1.5 10*3/uL (ref 0.7–3.1)
Lymphs: 30 %
MCH: 29.2 pg (ref 26.6–33.0)
MCHC: 33.1 g/dL (ref 31.5–35.7)
MCV: 88 fL (ref 79–97)
Monocytes Absolute: 0.4 10*3/uL (ref 0.1–0.9)
Monocytes: 8 %
Neutrophils Absolute: 2.9 10*3/uL (ref 1.4–7.0)
Neutrophils: 60 %
Platelets: 297 10*3/uL (ref 150–450)
RBC: 4.31 x10E6/uL (ref 3.77–5.28)
RDW: 13.6 % (ref 11.7–15.4)
WBC: 4.9 10*3/uL (ref 3.4–10.8)

## 2022-08-01 LAB — LIPID PANEL
Chol/HDL Ratio: 3.9 ratio (ref 0.0–4.4)
Cholesterol, Total: 193 mg/dL (ref 100–199)
HDL: 50 mg/dL (ref >39)
LDL Chol Calc (NIH): 125 mg/dL — ABNORMAL HIGH (ref 0–99)
Triglycerides: 100 mg/dL (ref 0–149)
VLDL Cholesterol Cal: 18 mg/dL (ref 5–40)

## 2022-08-01 LAB — MICROALBUMIN / CREATININE URINE RATIO
Creatinine, Urine: 155.9 mg/dL
Microalb/Creat Ratio: 6 mg/g creat (ref 0–29)
Microalbumin, Urine: 8.6 ug/mL

## 2022-08-01 LAB — COMPREHENSIVE METABOLIC PANEL
ALT: 38 IU/L — ABNORMAL HIGH (ref 0–32)
AST: 23 IU/L (ref 0–40)
Albumin/Globulin Ratio: 1.9 (ref 1.2–2.2)
Albumin: 4.3 g/dL (ref 3.9–4.9)
Alkaline Phosphatase: 97 IU/L (ref 44–121)
BUN/Creatinine Ratio: 18 (ref 12–28)
BUN: 18 mg/dL (ref 8–27)
Bilirubin Total: 0.2 mg/dL (ref 0.0–1.2)
CO2: 28 mmol/L (ref 20–29)
Calcium: 9.4 mg/dL (ref 8.7–10.3)
Chloride: 98 mmol/L (ref 96–106)
Creatinine, Ser: 1.02 mg/dL — ABNORMAL HIGH (ref 0.57–1.00)
Globulin, Total: 2.3 g/dL (ref 1.5–4.5)
Glucose: 143 mg/dL — ABNORMAL HIGH (ref 70–99)
Potassium: 5.1 mmol/L (ref 3.5–5.2)
Sodium: 139 mmol/L (ref 134–144)
Total Protein: 6.6 g/dL (ref 6.0–8.5)
eGFR: 60 mL/min/{1.73_m2} (ref >59)

## 2022-08-01 LAB — HEMOGLOBIN A1C
Est. average glucose Bld gHb Est-mCnc: 151 mg/dL
Hgb A1c MFr Bld: 6.9 % — ABNORMAL HIGH (ref 4.8–5.6)

## 2022-08-01 LAB — PHOSPHORUS: Phosphorus: 4.2 mg/dL (ref 3.0–4.3)

## 2022-08-01 LAB — CARDIOVASCULAR RISK ASSESSMENT

## 2022-08-01 LAB — TSH: TSH: 2.75 u[IU]/mL (ref 0.450–4.500)

## 2022-08-01 LAB — MAGNESIUM: Magnesium: 2.4 mg/dL — ABNORMAL HIGH (ref 1.6–2.3)

## 2022-08-02 ENCOUNTER — Telehealth: Payer: Self-pay

## 2022-08-02 ENCOUNTER — Ambulatory Visit (HOSPITAL_COMMUNITY)
Admission: RE | Admit: 2022-08-02 | Discharge: 2022-08-02 | Disposition: A | Discharge status: Home / Self Care | Payer: PPO | Source: Ambulatory Visit | Attending: Internal Medicine | Admitting: Internal Medicine

## 2022-08-02 ENCOUNTER — Encounter (HOSPITAL_COMMUNITY): Payer: Self-pay | Admitting: Internal Medicine

## 2022-08-02 VITALS — BP 120/84 | HR 105 | Wt 132.8 lb

## 2022-08-02 DIAGNOSIS — I2510 Atherosclerotic heart disease of native coronary artery without angina pectoris: Secondary | ICD-10-CM | POA: Diagnosis not present

## 2022-08-02 DIAGNOSIS — I4819 Other persistent atrial fibrillation: Secondary | ICD-10-CM

## 2022-08-02 DIAGNOSIS — I340 Nonrheumatic mitral (valve) insufficiency: Secondary | ICD-10-CM | POA: Insufficient documentation

## 2022-08-02 DIAGNOSIS — Z79899 Other long term (current) drug therapy: Secondary | ICD-10-CM | POA: Insufficient documentation

## 2022-08-02 DIAGNOSIS — D6869 Other thrombophilia: Secondary | ICD-10-CM | POA: Insufficient documentation

## 2022-08-02 DIAGNOSIS — I10 Essential (primary) hypertension: Secondary | ICD-10-CM | POA: Diagnosis not present

## 2022-08-02 DIAGNOSIS — Z7901 Long term (current) use of anticoagulants: Secondary | ICD-10-CM | POA: Diagnosis not present

## 2022-08-02 DIAGNOSIS — E119 Type 2 diabetes mellitus without complications: Secondary | ICD-10-CM | POA: Diagnosis not present

## 2022-08-02 DIAGNOSIS — I480 Paroxysmal atrial fibrillation: Secondary | ICD-10-CM | POA: Diagnosis not present

## 2022-08-02 MED ORDER — APIXABAN 5 MG PO TABS: 5.0000 mg | ORAL_TABLET | Freq: Two times a day (BID) | ORAL | 4 refills | Status: DC

## 2022-08-02 MED ORDER — ACEBUTOLOL HCL 200 MG PO CAPS: 200.0000 mg | ORAL_CAPSULE | Freq: Every day | ORAL | 3 refills | Status: DC

## 2022-08-02 MED ORDER — DILTIAZEM HCL ER COATED BEADS 120 MG PO CP24: 120.0000 mg | ORAL_CAPSULE | Freq: Every day | ORAL | 6 refills | Status: DC

## 2022-08-02 NOTE — Progress Notes (Signed)
Primary Care Physician: Blane Ohara, MD Primary Cardiologist: Dr. Bing Matter Primary Electrophysiologist: Dr. Elberta Fortis Referring Physician:    [name] is a 67 y.o. female with a history of aortic stenosis s/p AVR with LAA clipping at time of AVR, aortic atherosclerosis, minimal nonobstructive CAD s/p cath 05/20/19, T2DM, HTN, and persistent atrial fibrillation who presents for consultation in the [facility]. History of DCCV on 07/10/19. Most recently s/p Afib ablation by Dr. Elberta Fortis on 07/05/22. Patient is on Eliquis 5 mg BID for a CHADS2VASC score of 5.  On evaluation today, she is currently in Afib. S/p Afib ablation by Dr. Elberta Fortis on 07/05/22. Review of records show she contacted office on 5/6 feeling back in Afib. Dyspnea on exertion and palpitations are primary symptoms. She has taken diltiazem prn. Leg sites healed without issue. No trouble swallowing.  She is compliant with anticoagulation and has not missed any doses. She has no bleeding concerns.  Sleep study ordered by PCP on 07/24/22.   Today, she denies symptoms of palpitations, chest pain, orthopnea, PND, lower extremity edema, dizziness, presyncope, syncope, snoring, daytime somnolence, bleeding, or neurologic sequela. The patient is tolerating medications without difficulties and is otherwise without complaint today.   Atrial Fibrillation Risk Factors:  she does have symptoms or diagnosis of sleep apnea.  she has a BMI of Body mass index is 24.29 kg/m.[name] Filed Weights   08/02/22 1129  Weight: 60.2 kg    Family History  Problem Relation Age of Onset   Heart Problems Mother    Hypertension Mother    Heart disease Mother    Heart Problems Father    Heart disease Father    Heart disease Sister    Heart attack Sister    Blindness Daughter    Diabetes Daughter    Developmental delay Daughter    Colon cancer Maternal Grandmother    Heart disease Maternal Grandmother    AAA (abdominal  aortic aneurysm) Paternal Grandfather      Atrial Fibrillation Management history:  Previous antiarrhythmic drugs: None Previous cardioversions: 07/10/19 Previous ablations: 07/05/22 Anticoagulation history: Eliquis 5 mg BID   Past Medical History:  Diagnosis Date   Adult attention deficit disorder 05/15/2013   Anxiety state 11/19/2012   ICD10   Aortic stenosis 12/13/2017   Moderate by echocardiogram from 2019 mean gradient 25   Aortic stenosis, mild 07/13/2016   Moderate by echocardiogram from 2019 mean gradient 25   Aortic stenosis, severe 07/02/2019   Aortic valve sclerosis 05/24/2015   Formatting of this note might be different from the original. Without stenosis. Last Echo 2012   Asthma    mild   Atrial fibrillation (HCC) 06/28/2018   Atrial fibrillation with RVR (HCC) 06/28/2018   CHF (congestive heart failure) (HCC)    Chronic bilateral low back pain 01/27/2015   Depression    Diabetes mellitus without complication (HCC)    borderline   Dyspnea    on exertion   Dyspnea on exertion 11/01/2017   Elective surgery for purposes other than treating health conditions 09/27/2010   ICD10   Essential hypertension 06/30/2018   Family history of colon cancer 03/01/2005   Family history of premature coronary artery disease 03/01/2005   GAD (generalized anxiety disorder) 03/01/2005   Formatting of this note might be different from the original. with panic attacks   Hepatitis    due to cytomegalovirus-resolved   History of breast cancer 06/30/2018   History of hiatal hernia  mild,found on a CT   History of malignant neoplasm of breast 03/01/2005   Formatting of this note might be different from the original. node neg; see chartnotes for treatment   HTN (hypertension), benign 07/13/2016   Hyperlipidemia 06/30/2018   Hypokalemia 06/30/2018   Inflamed seborrheic keratosis 08/08/2012   Irritable bowel syndrome 03/01/2005   Malignant neoplasm of upper-inner quadrant of  female breast (HCC) 2003   Primary  diagnosis   Mild intermittent asthma without complication 03/01/2005   Formatting of this note might be different from the original. mild   Mitral regurgitation 12/13/2017   Mild to moderate based on echo from 2019   Mixed hyperlipidemia 07/20/2019   NICM (nonischemic cardiomyopathy) (HCC) 11/01/2015   Osteopenia 01/27/2015   Paroxysmal atrial fibrillation (HCC) 07/14/2019   Pneumonia 2018   Post menopausal problems 01/27/2015   Precordial pain 11/01/2015   Psoriatic arthritis (HCC) 04/25/2015   Recurrent genital HSV (herpes simplex virus) infection 03/01/2005   Formatting of this note might be different from the original. HSV genital   Restless legs syndrome 03/01/2005   Rosacea 03/01/2005   Routine history and physical examination of adult 09/22/2012   Formatting of this note might be different from the original. PROBLEM LIST:  1. Left node negative breast cancer 2002, stage I, grade 1, status post  lumpectomy and node dissection, external beam radiation,  chemotherapy/Cytoxan, mild left arm lymphedema with cellulitis 2004.  2. Anxiety disorder with panic attacks, family history of same, globus  pharyngeus, palpitations, atypical chest pain, str   S/P aortic valve replacement with bioprosthetic valve 21 mm Edwards INSPIRIS RESILIA  07/02/2019   Size 21 mm   S/P AVR (aortic valve replacement) 07/20/2019   Seborrheic keratoses 09/10/2016   Sleep apnea    borderline   Vaginal dryness 01/27/2015   Past Surgical History:  Procedure Laterality Date   AORTIC VALVE REPLACEMENT N/A 07/02/2019   Procedure: AORTIC VALVE REPLACEMENT (AVR) USING INSPIRIS VALVE SIZE ;  Surgeon: Alleen Borne, MD;  Location: [facility] OR;  Service: Open Heart Surgery;  Laterality: N/A;   ATRIAL FIBRILLATION ABLATION N/A 07/05/2022   Procedure: ATRIAL FIBRILLATION ABLATION;  Surgeon: Regan Lemming, MD;  Location: [facility] INVASIVE CV LAB;  Service: Cardiovascular;  Laterality:  N/A;   BREAST LUMPECTOMY Left 2003   sentinal node bx.   CARDIOVERSION N/A 07/10/2019   Procedure: CARDIOVERSION;  Surgeon: Little Ishikawa, MD;  Location: [facility] ENDOSCOPY;  Service: Cardiovascular;  Laterality: N/A;   CESAREAN SECTION     CLIPPING OF ATRIAL APPENDAGE N/A 07/02/2019   Procedure: CLIPPING OF ATRIAL APPENDAGE USING PRO2 CLIP SIZE ;  Surgeon: Alleen Borne, MD;  Location: [facility] OR;  Service: Open Heart Surgery;  Laterality: N/A;   COSMETIC SURGERY     IR THORACENTESIS ASP PLEURAL SPACE W/IMG GUIDE  07/07/2019   RIGHT/LEFT HEART CATH AND CORONARY ANGIOGRAPHY N/A 05/20/2019   Procedure: RIGHT/LEFT HEART CATH AND CORONARY ANGIOGRAPHY;  Surgeon: Tonny Bollman, MD;  Location: [facility] INVASIVE CV LAB;  Service: Cardiovascular;  Laterality: N/A;   TEE WITHOUT CARDIOVERSION N/A 07/02/2019   Procedure: TRANSESOPHAGEAL ECHOCARDIOGRAM (TEE);  Surgeon: Alleen Borne, MD;  Location: [facility] OR;  Service: Open Heart Surgery;  Laterality: N/A;   TONSILLECTOMY      Current Outpatient Medications  Medication Sig Dispense Refill   acebutolol (SECTRAL) 200 MG capsule Take 1 capsule (200 mg total) by mouth daily. 90 capsule 3   acetaminophen (TYLENOL) 500 MG tablet Take 500-1,000 mg by mouth every  6 (six) hours as needed for moderate pain.     Acetylcysteine (NAC PO) Take 1 tablet by mouth 2 (two) times daily.     amoxicillin (AMOXIL) 500 MG capsule TAKE 4 CAPSULES BY MOUTH ONE HOUR BEFORE DENTAL PROCEDURES 4 capsule 5   apixaban (ELIQUIS) 5 MG TABS tablet Take 1 tablet (5 mg total) by mouth 2 (two) times daily. 60 tablet 4   diltiazem (CARDIZEM CD) 120 MG 24 hr capsule Take 1 capsule (120 mg total) by mouth daily. 30 capsule 6   diltiazem (CARDIZEM) 30 MG tablet Take 1 tablet (30 mg total) by mouth every 8 (eight) hours as needed (palpations). 60 tablet 1   diphenhydrAMINE (BENADRYL) 25 MG tablet Take 25 mg by mouth daily as needed for itching.     furosemide (LASIX) 20 MG tablet Take 20 mg by  mouth daily.     LORazepam (ATIVAN) 1 MG tablet Take 1 mg by mouth every 8 (eight) hours as needed for anxiety or sleep.     melatonin 3 MG TABS tablet Take 3 mg by mouth at bedtime as needed (sleep).     Methylsulfonylmethane (MSM PO) Take 1 tablet by mouth every other day.     ondansetron (ZOFRAN) 4 MG tablet Take 4 mg by mouth every 8 (eight) hours as needed for nausea or vomiting.     ramelteon (ROZEREM) 8 MG tablet Take 1 tablet (8 mg total) by mouth at bedtime. 30 tablet 5   sodium chloride (OCEAN) 0.65 % SOLN nasal spray Place 1 spray into both nostrils as needed for congestion.     triamcinolone cream (KENALOG) 0.1 % Apply 1 Application topically 2 (two) times daily as needed (psoriasis).     Vitamin D, Ergocalciferol, (DRISDOL) 1.25 MG (50000 UNIT) CAPS capsule Take 50,000 Units by mouth every 7 (seven) days.     No current facility-administered medications for this encounter.    Allergies  Allergen Reactions   Lisinopril Cough   Metoprolol Rash and Other (See Comments)    Social History   Socioeconomic History   Marital status: Married    Spouse name: Not on file   Number of children: 5   Years of education: Not on file   Highest education level: Associate degree: academic program  Occupational History   Occupation: retired  Tobacco Use   Smoking status: Never   Smokeless tobacco: Never  Vaping Use   Vaping Use: Never used  Substance and Sexual Activity   Alcohol use: Yes    Comment: occasional social drinking (once per month)    Drug use: Yes    Types: Marijuana    Comment: smoked college years, chew THC gummies   Sexual activity: Yes    Partners: Male    Birth control/protection: None  Other Topics Concern   Not on file  Social History Narrative   Not on file   Social Determinants of Health   Financial Resource Strain: Low Risk  (07/30/2022)   Overall Financial Resource Strain (CARDIA)    Difficulty of Paying Living Expenses: Not very hard  Food  Insecurity: No Food Insecurity (07/30/2022)   Hunger Vital Sign    Worried About Running Out of Food in the Last Year: Never true    Ran Out of Food in the Last Year: Never true  Transportation Needs: No Transportation Needs (07/30/2022)   PRAPARE - Administrator, Civil Service (Medical): No    Lack of Transportation (Non-Medical): No  Physical Activity:  Sufficiently Active (07/30/2022)   Exercise Vital Sign    Days of Exercise per Week: 5 days    Minutes of Exercise per Session: 30 min  Stress: Stress Concern Present (07/30/2022)   [employer] of Occupational Health - Occupational Stress Questionnaire    Feeling of Stress : To some extent  Social Connections: Socially Integrated (07/30/2022)   Social Connection and Isolation Panel [NHANES]    Frequency of Communication with Friends and Family: More than three times a week    Frequency of Social Gatherings with Friends and Family: More than three times a week    Attends Religious Services: More than 4 times per year    Active Member of [employer] or Organizations: Yes    Attends Engineer, structural: More than 4 times per year    Marital Status: Married  Catering manager Violence: Not At Risk (07/31/2022)   Humiliation, Afraid, Rape, and Kick questionnaire    Fear of Current or Ex-Partner: No    Emotionally Abused: No    Physically Abused: No    Sexually Abused: No     ROS- All systems are reviewed and negative except as per the HPI above.  Physical Exam: Vitals:   08/02/22 1129  BP: 120/84  Pulse: (!) 105  Weight: 60.2 kg    GEN- The patient is a well appearing female, alert and oriented x 3 today.   Head- normocephalic, atraumatic Eyes-  Sclera clear, conjunctiva pink Ears- hearing intact Oropharynx- clear Neck- supple  Lungs- Clear to ausculation bilaterally, normal work of breathing Heart- Tachycardic irregular rate and rhythm, no murmurs, rubs or gallops  GI- soft, NT, ND, + BS Extremities- no  clubbing, cyanosis, or edema MS- no significant deformity or atrophy Skin- no rash or lesion Psych- euthymic mood, full affect Neuro- strength and sensation are intact  Wt Readings from Last 3 Encounters:  08/02/22 60.2 kg  07/30/22 60.8 kg  07/24/22 61.7 kg    EKG today demonstrates  Vent. rate 90 BPM PR interval * ms QRS duration 80 ms QT/QTcB 364/445 ms P-R-T axes * 92 91 Atrial fibrillation Rightward axis Anteroseptal infarct , age undetermined Abnormal ECG When compared with ECG of 05-Jul-2022 10:06, PREVIOUS ECG IS PRESENT  Echo 11/10/21 demonstrated:  1. Left ventricular ejection fraction, by estimation, is 50 to 55%. The  left ventricle has low normal function. The left ventricle has no regional  wall motion abnormalities. Left ventricular diastolic parameters were  normal.   2. Right ventricular systolic function is normal. The right ventricular  size is normal.   3. The mitral valve is normal in structure. Mild mitral valve  regurgitation. No evidence of mitral stenosis.   4. There is a 21 mm Edwards Inspiris valve present in the aortic  position. Procedure Date: 07/02/2019. Echo findings are consistent with  normal structure and function of the aortic valve prosthesis.   5. The inferior vena cava is normal in size with greater than 50%  respiratory variability, suggesting right atrial pressure of 3 mmHg.   Epic records are reviewed at length today.  CHA2DS2-VASc Score = 6  The patient's score is based upon: CHF History: 1 HTN History: 1 Diabetes History: 1 Stroke History: 0 Vascular Disease History: 1 Age Score: 1 Gender Score: 1       ASSESSMENT AND PLAN: Persistent Atrial Fibrillation (ICD10:  I48.19) The patient's CHA2DS2-VASc score is 6, indicating a 9.7% annual risk of stroke.   S/p Afib ablation by Dr. Elberta Fortis on  07/05/22  She is in Afib currently.  After discussion, we will proceed with scheduling DCCV. She has not missed any doses of  Eliquis. Labs recently drawn on 07/31/22.  2. Secondary Hypercoagulable State (ICD10:  D68.69) The patient is at significant risk for stroke/thromboembolism based upon her CHA2DS2-VASc Score of 6.  Continue Apixaban (Eliquis).  Continue without interruption.  3. Daytime somnolence with concern for obstructive sleep apnea Sleep study ordered by PCP.    F/u with Dr. Bing Matter as scheduled after DCCV.   Lake Bells, PA-C Afib Clinic [facility] [address] [place], [place] [address] [phone] 08/02/2022 11:56 AM

## 2022-08-02 NOTE — Telephone Encounter (Signed)
Care Management plan forms completed and faxed.

## 2022-08-02 NOTE — Patient Instructions (Signed)
Cardioversion scheduled for: Wednesday, May 29th   - Arrive at the [facility] and go to admitting at 930am   - Do not eat or drink anything after midnight the night prior to your procedure.   - Take all your morning medication (except diabetic medications) with a sip of water prior to arrival.  - You will not be able to drive home after your procedure.    - Do NOT miss any doses of your blood thinner - if you should miss a dose please notify our office immediately.   - If you feel as if you go back into normal rhythm prior to scheduled cardioversion, please notify our office immediately.   If your procedure is canceled in the cardioversion suite you will be charged a cancellation fee.

## 2022-08-07 ENCOUNTER — Encounter: Payer: Self-pay | Admitting: Cardiology

## 2022-08-07 ENCOUNTER — Telehealth: Payer: Self-pay | Admitting: Cardiology

## 2022-08-07 NOTE — Telephone Encounter (Signed)
Pt would like a callback regarding cancelling Cardioversion that's scheduled for tomorrow. Please advise

## 2022-08-07 NOTE — Telephone Encounter (Signed)
See MyChart encounter. Cardioversion has been cancelled.

## 2022-08-08 ENCOUNTER — Ambulatory Visit (HOSPITAL_COMMUNITY): Admission: RE | Admit: 2022-08-08 | Payer: PPO | Source: Home / Self Care | Admitting: Cardiovascular Disease

## 2022-08-08 ENCOUNTER — Telehealth: Payer: Self-pay | Admitting: Family Medicine

## 2022-08-08 ENCOUNTER — Encounter (HOSPITAL_COMMUNITY): Admission: RE | Payer: Self-pay | Source: Home / Self Care

## 2022-08-08 SURGERY — CARDIOVERSION
Anesthesia: General

## 2022-08-08 NOTE — Telephone Encounter (Signed)
   Lindsay Guerrero has been scheduled for the following appointment:  WHAT: SCREENING MAMMOGRAM WHERE: [place] [facility] DATE: 08/14/22 TIME: 12:30 PM CHECK-IN  Patient has been made aware.

## 2022-08-16 ENCOUNTER — Other Ambulatory Visit: Payer: Self-pay | Admitting: Family Medicine

## 2022-08-27 ENCOUNTER — Ambulatory Visit: Payer: Managed Care, Other (non HMO) | Admitting: Cardiology

## 2022-08-28 ENCOUNTER — Encounter: Payer: Self-pay | Admitting: Cardiology

## 2022-08-28 ENCOUNTER — Ambulatory Visit: Payer: PPO | Attending: Cardiology | Admitting: Cardiology

## 2022-08-28 VITALS — BP 124/78 | HR 72 | Ht 61.0 in | Wt 131.0 lb

## 2022-08-28 DIAGNOSIS — I4819 Other persistent atrial fibrillation: Secondary | ICD-10-CM | POA: Diagnosis not present

## 2022-08-28 DIAGNOSIS — Z953 Presence of xenogenic heart valve: Secondary | ICD-10-CM

## 2022-08-28 DIAGNOSIS — I480 Paroxysmal atrial fibrillation: Secondary | ICD-10-CM | POA: Diagnosis not present

## 2022-08-28 DIAGNOSIS — D6869 Other thrombophilia: Secondary | ICD-10-CM

## 2022-08-28 NOTE — Progress Notes (Unsigned)
Cardiology Office Note:    Date:  08/28/2022   ID:  Lindsay Guerrero, DOB 05-Jan-1956, MRN [id number]  PCP:  Blane Ohara, MD  Cardiologist:  Gypsy Balsam, MD    Referring MD: Hortencia Conradi, NP   Chief Complaint  Patient presents with   Follow-up    History of Present Illness:    Lindsay Guerrero is a 67 y.o. female past medical history significant for aortic valve stenosis, status post aortic valve replacement done in April 2021, cardiac catheterization at that time showed nonobstructive coronary artery disease, additional problem include essential hypertension, paroxysmal atrial fibrillation, status post atrial fibrillation ablation done at the end of April since that time she got 1 episode of atrial fibrillation she converted spontaneously to sinus rhythm.  She comes today to months for follow-up overall she is doing very well.  She denies have any chest pain tightness squeezing pressure burning chest no palpitation dizziness swelling of lower extremities  Past Medical History:  Diagnosis Date   Adult attention deficit disorder 05/15/2013   Anxiety state 11/19/2012   ICD10   Aortic stenosis 12/13/2017   Moderate by echocardiogram from 2019 mean gradient 25   Aortic stenosis, mild 07/13/2016   Moderate by echocardiogram from 2019 mean gradient 25   Aortic stenosis, severe 07/02/2019   Aortic valve sclerosis 05/24/2015   Formatting of this note might be different from the original. Without stenosis. Last Echo 2012   Asthma    mild   Atrial fibrillation (HCC) 06/28/2018   Atrial fibrillation with RVR (HCC) 06/28/2018   CHF (congestive heart failure) (HCC)    Chronic bilateral low back pain 01/27/2015   Depression    Diabetes mellitus without complication (HCC)    borderline   Dyspnea    on exertion   Dyspnea on exertion 11/01/2017   Elective surgery for purposes other than treating health conditions 09/27/2010   ICD10   Essential hypertension 06/30/2018   Family  history of colon cancer 03/01/2005   Family history of premature coronary artery disease 03/01/2005   GAD (generalized anxiety disorder) 03/01/2005   Formatting of this note might be different from the original. with panic attacks   Hepatitis    due to cytomegalovirus-resolved   History of breast cancer 06/30/2018   History of hiatal hernia    mild,found on a CT   History of malignant neoplasm of breast 03/01/2005   Formatting of this note might be different from the original. node neg; see chartnotes for treatment   HTN (hypertension), benign 07/13/2016   Hyperlipidemia 06/30/2018   Hypokalemia 06/30/2018   Inflamed seborrheic keratosis 08/08/2012   Irritable bowel syndrome 03/01/2005   Malignant neoplasm of upper-inner quadrant of female breast (HCC) 2003   Primary  diagnosis   Mild intermittent asthma without complication 03/01/2005   Formatting of this note might be different from the original. mild   Mitral regurgitation 12/13/2017   Mild to moderate based on echo from 2019   Mixed hyperlipidemia 07/20/2019   NICM (nonischemic cardiomyopathy) (HCC) 11/01/2015   Osteopenia 01/27/2015   Paroxysmal atrial fibrillation (HCC) 07/14/2019   Pneumonia 2018   Post menopausal problems 01/27/2015   Precordial pain 11/01/2015   Psoriatic arthritis (HCC) 04/25/2015   Recurrent genital HSV (herpes simplex virus) infection 03/01/2005   Formatting of this note might be different from the original. HSV genital   Restless legs syndrome 03/01/2005   Rosacea 03/01/2005   Routine history and physical examination of adult 09/22/2012   Formatting  of this note might be different from the original. PROBLEM LIST:  1. Left node negative breast cancer 2002, stage I, grade 1, status post  lumpectomy and node dissection, external beam radiation,  chemotherapy/Cytoxan, mild left arm lymphedema with cellulitis 2004.  2. Anxiety disorder with panic attacks, family history of same, globus  pharyngeus,  palpitations, atypical chest pain, str   S/P aortic valve replacement with bioprosthetic valve 21 mm Edwards INSPIRIS RESILIA  07/02/2019   Size 21 mm   S/P AVR (aortic valve replacement) 07/20/2019   Seborrheic keratoses 09/10/2016   Sleep apnea    borderline   Vaginal dryness 01/27/2015    Past Surgical History:  Procedure Laterality Date   AORTIC VALVE REPLACEMENT N/A 07/02/2019   Procedure: AORTIC VALVE REPLACEMENT (AVR) USING INSPIRIS VALVE SIZE ;  Surgeon: Alleen Borne, MD;  Location: [facility] OR;  Service: Open Heart Surgery;  Laterality: N/A;   ATRIAL FIBRILLATION ABLATION N/A 07/05/2022   Procedure: ATRIAL FIBRILLATION ABLATION;  Surgeon: Regan Lemming, MD;  Location: [facility] INVASIVE CV LAB;  Service: Cardiovascular;  Laterality: N/A;   BREAST LUMPECTOMY Left 2003   sentinal node bx.   CARDIOVERSION N/A 07/10/2019   Procedure: CARDIOVERSION;  Surgeon: Little Ishikawa, MD;  Location: [facility] ENDOSCOPY;  Service: Cardiovascular;  Laterality: N/A;   CESAREAN SECTION     CLIPPING OF ATRIAL APPENDAGE N/A 07/02/2019   Procedure: CLIPPING OF ATRIAL APPENDAGE USING PRO2 CLIP SIZE ;  Surgeon: Alleen Borne, MD;  Location: [facility] OR;  Service: Open Heart Surgery;  Laterality: N/A;   COSMETIC SURGERY     IR THORACENTESIS ASP PLEURAL SPACE W/IMG GUIDE  07/07/2019   RIGHT/LEFT HEART CATH AND CORONARY ANGIOGRAPHY N/A 05/20/2019   Procedure: RIGHT/LEFT HEART CATH AND CORONARY ANGIOGRAPHY;  Surgeon: Tonny Bollman, MD;  Location: [facility] INVASIVE CV LAB;  Service: Cardiovascular;  Laterality: N/A;   TEE WITHOUT CARDIOVERSION N/A 07/02/2019   Procedure: TRANSESOPHAGEAL ECHOCARDIOGRAM (TEE);  Surgeon: Alleen Borne, MD;  Location[facility] OR;  Service: Open Heart Surgery;  Laterality: N/A;   TONSILLECTOMY      Current Medications: Current Meds  Medication Sig   acebutolol (SECTRAL) 200 MG capsule Take 1 capsule (200 mg total) by mouth daily.   acetaminophen (TYLENOL) 500 MG tablet Take  500-1,000 mg by mouth every 6 (six) hours as needed for moderate pain.   Acetylcysteine (NAC PO) Take 1 tablet by mouth 2 (two) times daily.   Acetylcysteine (NAC) 600 MG CAPS Take 600-1,200 mg by mouth daily.   amoxicillin (AMOXIL) 500 MG capsule TAKE 4 CAPSULES BY MOUTH ONE HOUR BEFORE DENTAL PROCEDURES (Patient taking differently: Take 2,000 mg by mouth as needed (see below). TAKE 4 CAPSULES BY MOUTH ONE HOUR BEFORE DENTAL PROCEDURES)   apixaban (ELIQUIS) 5 MG TABS tablet Take 1 tablet (5 mg total) by mouth 2 (two) times daily.   calcium carbonate (TUMS - DOSED IN MG ELEMENTAL CALCIUM) 500 MG chewable tablet Chew 1-2 tablets by mouth daily as needed for indigestion or heartburn.   diltiazem (CARDIZEM CD) 120 MG 24 hr capsule Take 1 capsule (120 mg total) by mouth daily.   diltiazem (CARDIZEM) 30 MG tablet Take 1 tablet (30 mg total) by mouth every 8 (eight) hours as needed (palpations).   diphenhydrAMINE (BENADRYL) 25 MG tablet Take 25 mg by mouth daily as needed for itching.   furosemide (LASIX) 20 MG tablet Take 20 mg by mouth daily.   LORazepam (ATIVAN) 1 MG tablet Take 1 mg by mouth  every 8 (eight) hours as needed for anxiety or sleep.   melatonin 3 MG TABS tablet Take 3 mg by mouth at bedtime as needed (sleep).   Methylsulfonylmethane (MSM PO) Take 2 tablets by mouth 2 (two) times daily.   omeprazole (PRILOSEC OTC) 20 MG tablet Take 20 mg by mouth daily as needed (acid reflux).   ondansetron (ZOFRAN) 4 MG tablet Take 4 mg by mouth every 8 (eight) hours as needed for nausea or vomiting.   ramelteon (ROZEREM) 8 MG tablet TAKE 1 TABLET BY MOUTH AT BEDTIME.   sodium chloride (OCEAN) 0.65 % SOLN nasal spray Place 1 spray into both nostrils 2 (two) times daily.   triamcinolone cream (KENALOG) 0.1 % Apply 1 Application topically 2 (two) times daily as needed (psoriasis).   Vitamin D, Ergocalciferol, (DRISDOL) 1.25 MG (50000 UNIT) CAPS capsule Take 50,000 Units by mouth every Monday.      Allergies:   Lisinopril and Metoprolol   Social History   Socioeconomic History   Marital status: Married    Spouse name: Not on file   Number of children: 5   Years of education: Not on file   Highest education level: Associate degree: academic program  Occupational History   Occupation: retired  Tobacco Use   Smoking status: Never   Smokeless tobacco: Never  Vaping Use   Vaping Use: Never used  Substance and Sexual Activity   Alcohol use: Yes    Comment: occasional social drinking (once per month)    Drug use: Yes    Types: Marijuana    Comment: smoked college years, chew THC gummies   Sexual activity: Yes    Partners: Male    Birth control/protection: None  Other Topics Concern   Not on file  Social History Narrative   Not on file   Social Determinants of Health   Financial Resource Strain: Low Risk  (07/30/2022)   Overall Financial Resource Strain (CARDIA)    Difficulty of Paying Living Expenses: Not very hard  Food Insecurity: No Food Insecurity (07/30/2022)   Hunger Vital Sign    Worried About Running Out of Food in the Last Year: Never true    Ran Out of Food in the Last Year: Never true  Transportation Needs: No Transportation Needs (07/30/2022)   PRAPARE - Administrator, Civil Service (Medical): No    Lack of Transportation (Non-Medical): No  Physical Activity: Sufficiently Active (07/30/2022)   Exercise Vital Sign    Days of Exercise per Week: 5 days    Minutes of Exercise per Session: 30 min  Stress: Stress Concern Present (07/30/2022)   [employer] of Occupational Health - Occupational Stress Questionnaire    Feeling of Stress : To some extent  Social Connections: Socially Integrated (07/30/2022)   Social Connection and Isolation Panel [NHANES]    Frequency of Communication with Friends and Family: More than three times a week    Frequency of Social Gatherings with Friends and Family: More than three times a week    Attends Religious  Services: More than 4 times per year    Active Member of [employer] or Organizations: Yes    Attends Engineer, structural: More than 4 times per year    Marital Status: Married     Family History: The patient's family history includes AAA (abdominal aortic aneurysm) in her paternal grandfather; Blindness in her daughter; Colon cancer in her maternal grandmother; Developmental delay in her daughter; Diabetes in her daughter; Heart Problems  in her father and mother; Heart attack in her sister; Heart disease in her father, maternal grandmother, mother, and sister; Hypertension in her mother. ROS:   Please see the history of present illness.    All 14 point review of systems negative except as described per history of present illness  EKGs/Labs/Other Studies Reviewed:      Recent Labs: 03/14/2022: NT-Pro BNP 393 07/31/2022: ALT 38; BUN 18; Creatinine, Ser 1.02; Hemoglobin 12.6; Magnesium 2.4; Platelets 297; Potassium 5.1; Sodium 139; TSH 2.750  Recent Lipid Panel    Component Value Date/Time   CHOL 193 07/31/2022 0936   TRIG 100 07/31/2022 0936   HDL 50 07/31/2022 0936   CHOLHDL 3.9 07/31/2022 0936   LDLCALC 125 (H) 07/31/2022 0936   LDLDIRECT 166 (H) 09/15/2020 1326    Physical Exam:    VS:  BP 124/78 (BP Location: Left Arm, Patient Position: Sitting)   Pulse 72   Ht 5\' 1"  (1.549 m)   Wt 131 lb (59.4 kg)   LMP  (LMP Unknown)   SpO2 96%   BMI 24.75 kg/m     Wt Readings from Last 3 Encounters:  08/28/22 131 lb (59.4 kg)  08/02/22 132 lb 12.8 oz (60.2 kg)  07/30/22 134 lb (60.8 kg)     GEN:  Well nourished, well developed in no acute distress HEENT: Normal NECK: No JVD; No carotid bruits LYMPHATICS: No lymphadenopathy CARDIAC: RRR, soft systolic murmur grade 1/6 to 2/6 best heard right upper portion of the sternum, no rubs, no gallops RESPIRATORY:  Clear to auscultation without rales, wheezing or rhonchi  ABDOMEN: Soft, non-tender, non-distended MUSCULOSKELETAL:  No  edema; No deformity  SKIN: Warm and dry LOWER EXTREMITIES: no swelling NEUROLOGIC:  Alert and oriented x 3 PSYCHIATRIC:  Normal affect   ASSESSMENT:    1. S/P aortic valve replacement with bioprosthetic valve 21 mm Edwards INSPIRIS RESILIA    2. Paroxysmal atrial fibrillation (HCC)   3. Hypercoagulable state due to persistent atrial fibrillation (HCC)    PLAN:    In order of problems listed above:  Status post aortic valve replacement doing well last echocardiogram reviewed normal function. Paroxysmal atrial fibrillation: She is on anticoagulation which I will continue And change show hypertension blood pressure well-controlled continue present management. Dyslipidemia I did review K PN LDL 125 HDL 50.  She does not want to take any cholesterol medication.  Will continue discussion.   Medication Adjustments/Labs and Tests Ordered: Current medicines are reviewed at length with the patient today.  Concerns regarding medicines are outlined above.  No orders of the defined types were placed in this encounter.  Medication changes: No orders of the defined types were placed in this encounter.   Signed, Georgeanna Lea, MD, [facility] 08/28/2022 12:01 PM    [facility]

## 2022-08-28 NOTE — Patient Instructions (Signed)

## 2022-08-30 DIAGNOSIS — K080 Exfoliation of teeth due to systemic causes: Secondary | ICD-10-CM | POA: Diagnosis not present

## 2022-09-03 DIAGNOSIS — Z1231 Encounter for screening mammogram for malignant neoplasm of breast: Secondary | ICD-10-CM | POA: Diagnosis not present

## 2022-09-03 LAB — HM MAMMOGRAPHY

## 2022-09-06 ENCOUNTER — Encounter: Payer: Self-pay | Admitting: Family Medicine

## 2022-09-06 DIAGNOSIS — G4733 Obstructive sleep apnea (adult) (pediatric): Secondary | ICD-10-CM | POA: Diagnosis not present

## 2022-09-06 DIAGNOSIS — R0602 Shortness of breath: Secondary | ICD-10-CM | POA: Diagnosis not present

## 2022-09-06 LAB — PULMONARY FUNCTION TEST

## 2022-09-09 DIAGNOSIS — G4733 Obstructive sleep apnea (adult) (pediatric): Secondary | ICD-10-CM | POA: Diagnosis not present

## 2022-09-09 DIAGNOSIS — R0602 Shortness of breath: Secondary | ICD-10-CM | POA: Diagnosis not present

## 2022-09-10 NOTE — Therapy (Deleted)
OUTPATIENT PHYSICAL THERAPY FEMALE PELVIC EVALUATION   Patient Name: Lindsay Guerrero MRN: [id number] DOB:01/21/1956, 67 y.o., female Today's Date: 09/10/2022  END OF SESSION:   Past Medical History:  Diagnosis Date   Adult attention deficit disorder 05/15/2013   Anxiety state 11/19/2012   ICD10   Aortic stenosis 12/13/2017   Moderate by echocardiogram from 2019 mean gradient 25   Aortic stenosis, mild 07/13/2016   Moderate by echocardiogram from 2019 mean gradient 25   Aortic stenosis, severe 07/02/2019   Aortic valve sclerosis 05/24/2015   Formatting of this note might be different from the original. Without stenosis. Last Echo 2012   Asthma    mild   Atrial fibrillation (HCC) 06/28/2018   Atrial fibrillation with RVR (HCC) 06/28/2018   CHF (congestive heart failure) (HCC)    Chronic bilateral low back pain 01/27/2015   Depression    Diabetes mellitus without complication (HCC)    borderline   Dyspnea    on exertion   Dyspnea on exertion 11/01/2017   Elective surgery for purposes other than treating health conditions 09/27/2010   ICD10   Essential hypertension 06/30/2018   Family history of colon cancer 03/01/2005   Family history of premature coronary artery disease 03/01/2005   GAD (generalized anxiety disorder) 03/01/2005   Formatting of this note might be different from the original. with panic attacks   Hepatitis    due to cytomegalovirus-resolved   History of breast cancer 06/30/2018   History of hiatal hernia    mild,found on a CT   History of malignant neoplasm of breast 03/01/2005   Formatting of this note might be different from the original. node neg; see chartnotes for treatment   HTN (hypertension), benign 07/13/2016   Hyperlipidemia 06/30/2018   Hypokalemia 06/30/2018   Inflamed seborrheic keratosis 08/08/2012   Irritable bowel syndrome 03/01/2005   Malignant neoplasm of upper-inner quadrant of female breast (HCC) 2003   Primary  diagnosis   Mild  intermittent asthma without complication 03/01/2005   Formatting of this note might be different from the original. mild   Mitral regurgitation 12/13/2017   Mild to moderate based on echo from 2019   Mixed hyperlipidemia 07/20/2019   NICM (nonischemic cardiomyopathy) (HCC) 11/01/2015   Osteopenia 01/27/2015   Paroxysmal atrial fibrillation (HCC) 07/14/2019   Pneumonia 2018   Post menopausal problems 01/27/2015   Precordial pain 11/01/2015   Psoriatic arthritis (HCC) 04/25/2015   Recurrent genital HSV (herpes simplex virus) infection 03/01/2005   Formatting of this note might be different from the original. HSV genital   Restless legs syndrome 03/01/2005   Rosacea 03/01/2005   Routine history and physical examination of adult 09/22/2012   Formatting of this note might be different from the original. PROBLEM LIST:  1. Left node negative breast cancer 2002, stage I, grade 1, status post  lumpectomy and node dissection, external beam radiation,  chemotherapy/Cytoxan, mild left arm lymphedema with cellulitis 2004.  2. Anxiety disorder with panic attacks, family history of same, globus  pharyngeus, palpitations, atypical chest pain, str   S/P aortic valve replacement with bioprosthetic valve 21 mm Edwards INSPIRIS RESILIA  07/02/2019   Size 21 mm   S/P AVR (aortic valve replacement) 07/20/2019   Seborrheic keratoses 09/10/2016   Sleep apnea    borderline   Vaginal dryness 01/27/2015   Past Surgical History:  Procedure Laterality Date   AORTIC VALVE REPLACEMENT N/A 07/02/2019   Procedure: AORTIC VALVE REPLACEMENT (AVR) USING INSPIRIS VALVE SIZE ;  Surgeon:  Alleen Borne, MD;  Location: [facility] OR;  Service: Open Heart Surgery;  Laterality: N/A;   ATRIAL FIBRILLATION ABLATION N/A 07/05/2022   Procedure: ATRIAL FIBRILLATION ABLATION;  Surgeon: Regan Lemming, MD;  Location: [facility] INVASIVE CV LAB;  Service: Cardiovascular;  Laterality: N/A;   BREAST LUMPECTOMY Left 2003   sentinal node bx.    CARDIOVERSION N/A 07/10/2019   Procedure: CARDIOVERSION;  Surgeon: Little Ishikawa, MD;  Location: [facility] ENDOSCOPY;  Service: Cardiovascular;  Laterality: N/A;   CESAREAN SECTION     CLIPPING OF ATRIAL APPENDAGE N/A 07/02/2019   Procedure: CLIPPING OF ATRIAL APPENDAGE USING PRO2 CLIP SIZE ;  Surgeon: Alleen Borne, MD;  Location: [facility] OR;  Service: Open Heart Surgery;  Laterality: N/A;   COSMETIC SURGERY     IR THORACENTESIS ASP PLEURAL SPACE W/IMG GUIDE  07/07/2019   RIGHT/LEFT HEART CATH AND CORONARY ANGIOGRAPHY N/A 05/20/2019   Procedure: RIGHT/LEFT HEART CATH AND CORONARY ANGIOGRAPHY;  Surgeon: Tonny Bollman, MD;  Location: [facility] INVASIVE CV LAB;  Service: Cardiovascular;  Laterality: N/A;   TEE WITHOUT CARDIOVERSION N/A 07/02/2019   Procedure: TRANSESOPHAGEAL ECHOCARDIOGRAM (TEE);  Surgeon: Alleen Borne, MD;  Location: [facility] OR;  Service: Open Heart Surgery;  Laterality: N/A;   TONSILLECTOMY     Patient Active Problem List   Diagnosis Date Noted   Hypercoagulable state due to persistent atrial fibrillation (HCC) 08/02/2022   Daytime somnolence 07/29/2022   Myalgia 07/29/2022   Incontinence of feces 07/20/2022   Diverticulitis of colon 07/20/2022   Essential hemorrhagic thrombocythemia (HCC) 10/04/2020   Herpes zoster without complication 10/04/2020   Hypothyroidism 10/04/2020   Idiopathic peripheral autonomic neuropathy 10/04/2020   Other vitamin B12 deficiency anemias 10/04/2020   Vitamin D deficiency 10/04/2020   CHF (congestive heart failure) (HCC)    Type 2 diabetes mellitus without complications (HCC)    History of hiatal hernia    Sleep apnea    Mixed hyperlipidemia 07/20/2019   S/P aortic valve replacement with bioprosthetic valve 21 mm Edwards INSPIRIS RESILIA  07/02/2019   History of breast cancer 06/30/2018   Atrial fibrillation (HCC) 06/28/2018   Mitral regurgitation 12/13/2017   Hypertension associated with diabetes (HCC) 07/13/2016   NICM (nonischemic  cardiomyopathy) (HCC) 11/01/2015   Psoriatic arthritis (HCC) 04/25/2015   Chronic bilateral low back pain 01/27/2015   Osteopenia 01/27/2015   Adult attention deficit disorder 05/15/2013   History of malignant neoplasm of breast 03/01/2005   Family history of colon cancer 03/01/2005   Family history of premature coronary artery disease 03/01/2005   GAD (generalized anxiety disorder) 03/01/2005   Mild intermittent asthma without complication 03/01/2005   Recurrent genital HSV (herpes simplex virus) infection 03/01/2005   Restless legs syndrome 03/01/2005   Rosacea 03/01/2005    PCP: Blane Ohara, MD   REFERRING PROVIDER:   Leta Baptist, PA-C    REFERRING DIAG: R15.9 (ICD-10-CM) - Incontinence of feces, unspecified fecal incontinence type   THERAPY DIAG:  No diagnosis found.  Rationale for Evaluation and Treatment: Rehabilitation  ONSET DATE: ***  SUBJECTIVE:  SUBJECTIVE STATEMENT: *** Fluid intake: {Yes/No:304960894}   PAIN:  Are you having pain? {yes/no:20286} NPRS scale: ***/10 Pain location: {pelvic pain location:27098}  Pain type: {type:313116} Pain description: {PAIN DESCRIPTION:21022940}   Aggravating factors: *** Relieving factors: ***  PRECAUTIONS: {Therapy precautions:24002}  WEIGHT BEARING RESTRICTIONS: {Yes ***/No:24003}  FALLS:  Has patient fallen in last 6 months? {fallsyesno:27318}  LIVING ENVIRONMENT: Lives with: {[facility] lives with:25569::"lives with their family"} Lives in: {Lives in:25570} Stairs: {opstairs:27293} Has following equipment at home: {Assistive devices:23999}  OCCUPATION: ***  PLOF: {PLOF:24004}  PATIENT GOALS: ***  PERTINENT HISTORY:  *** Sexual abuse: {Yes/No:304960894}  BOWEL MOVEMENT: Pain with bowel movement:  {yes/no:20286} Type of bowel movement:{PT BM type:27100} Fully empty rectum: {Yes/No:304960894} Leakage: {Yes/No:304960894} Pads: {Yes/No:304960894} Fiber supplement: {Yes/No:304960894}  URINATION: Pain with urination: {yes/no:20286} Fully empty bladder: {Yes/No:304960894} Stream: {PT urination:27102} Urgency: {Yes/No:304960894} Frequency: *** Leakage: {PT leakage:27103} Pads: {Yes/No:304960894}  INTERCOURSE: Pain with intercourse: {pain with intercourse PA:27099} Ability to have vaginal penetration:  {Yes/No:304960894} Climax: *** Marinoff Scale: ***/3  PREGNANCY: Vaginal deliveries *** Tearing {Yes***/No:304960894} C-section deliveries *** Currently pregnant {Yes***/No:304960894}  PROLAPSE: {PT prolapse:27101}   OBJECTIVE:   DIAGNOSTIC FINDINGS:  ***  PATIENT SURVEYS:  {rehab surveys:24030}  PFIQ-7 ***  COGNITION: Overall cognitive status: {cognition:24006}     SENSATION: Light touch: {intact/deficits:24005} Proprioception: {intact/deficits:24005}  MUSCLE LENGTH: Hamstrings: Right *** deg; Left *** deg Thomas test: Right *** deg; Left *** deg  LUMBAR SPECIAL TESTS:  {lumbar special test:25242}  FUNCTIONAL TESTS:  {Functional tests:24029}  GAIT: Distance walked: *** Assistive device utilized: {Assistive devices:23999} Level of assistance: {Levels of assistance:24026} Comments: ***  POSTURE: {posture:25561}  PELVIC ALIGNMENT:  LUMBARAROM/PROM:  A/PROM A/PROM  eval  Flexion   Extension   Right lateral flexion   Left lateral flexion   Right rotation   Left rotation    (Blank rows = not tested)  LOWER EXTREMITY ROM:  {AROM/PROM:27142} ROM Right eval Left eval  Hip flexion    Hip extension    Hip abduction    Hip adduction    Hip internal rotation    Hip external rotation    Knee flexion    Knee extension    Ankle dorsiflexion    Ankle plantarflexion    Ankle inversion    Ankle eversion     (Blank rows = not  tested)  LOWER EXTREMITY MMT:  MMT Right eval Left eval  Hip flexion    Hip extension    Hip abduction    Hip adduction    Hip internal rotation    Hip external rotation    Knee flexion    Knee extension    Ankle dorsiflexion    Ankle plantarflexion    Ankle inversion    Ankle eversion     PALPATION:   General  ***                External Perineal Exam ***                             Internal Pelvic Floor ***  Patient confirms identification and approves PT to assess internal pelvic floor and treatment {yes/no:20286}  PELVIC MMT:   MMT eval  Vaginal   Internal Anal Sphincter   External Anal Sphincter   Puborectalis   Diastasis Recti   (Blank rows = not tested)        TONE: ***  PROLAPSE: ***  TODAY'S TREATMENT:  DATE: ***  EVAL ***   PATIENT EDUCATION:  Education details: *** Person educated: {Person educated:25204} Education method: {Education Method:25205} Education comprehension: {Education Comprehension:25206}  HOME EXERCISE PROGRAM: ***  ASSESSMENT:  CLINICAL IMPRESSION: Patient is a *** y.o. *** who was seen today for physical therapy evaluation and treatment for ***.   OBJECTIVE IMPAIRMENTS: {opptimpairments:25111}.   ACTIVITY LIMITATIONS: {activitylimitations:27494}  PARTICIPATION LIMITATIONS: {participationrestrictions:25113}  PERSONAL FACTORS: {Personal factors:25162} are also affecting patient's functional outcome.   REHAB POTENTIAL: {rehabpotential:25112}  CLINICAL DECISION MAKING: {clinical decision making:25114}  EVALUATION COMPLEXITY: {Evaluation complexity:25115}   GOALS: Goals reviewed with patient? {yes/no:20286}  SHORT TERM GOALS: Target date: ***  *** Baseline: Goal status: INITIAL  2.  *** Baseline:  Goal status: INITIAL  3.  *** Baseline:  Goal status: INITIAL  4.   *** Baseline:  Goal status: INITIAL  5.  *** Baseline:  Goal status: INITIAL  6.  *** Baseline:  Goal status: INITIAL  LONG TERM GOALS: Target date: ***  *** Baseline:  Goal status: INITIAL  2.  *** Baseline:  Goal status: INITIAL  3.  *** Baseline:  Goal status: INITIAL  4.  *** Baseline:  Goal status: INITIAL  5.  *** Baseline:  Goal status: INITIAL  6.  *** Baseline:  Goal status: INITIAL  PLAN:  PT FREQUENCY: {rehab frequency:25116}  PT DURATION: {rehab duration:25117}  PLANNED INTERVENTIONS: {rehab planned interventions:25118::"Therapeutic exercises","Therapeutic activity","Neuromuscular re-education","Balance training","Gait training","Patient/Family education","Self Care","Joint mobilization"}  PLAN FOR NEXT SESSION: ***   Brayton Caves Demetruis Depaul, PT 09/10/2022, 12:38 PM

## 2022-09-11 ENCOUNTER — Ambulatory Visit: Payer: PPO | Admitting: Physical Therapy

## 2022-10-02 ENCOUNTER — Other Ambulatory Visit: Payer: Self-pay | Admitting: Family Medicine

## 2022-10-03 MED ORDER — LORAZEPAM 1 MG PO TABS: 1.0000 mg | ORAL_TABLET | Freq: Three times a day (TID) | ORAL | 0 refills | Status: DC | PRN

## 2022-10-05 ENCOUNTER — Encounter: Payer: Self-pay | Admitting: Family Medicine

## 2022-10-05 ENCOUNTER — Other Ambulatory Visit: Payer: Self-pay | Admitting: Family Medicine

## 2022-10-08 ENCOUNTER — Ambulatory Visit: Payer: PPO | Attending: Cardiology | Admitting: Cardiology

## 2022-10-08 ENCOUNTER — Encounter: Payer: Self-pay | Admitting: Cardiology

## 2022-10-08 VITALS — BP 124/84 | HR 67 | Ht 61.0 in | Wt 134.0 lb

## 2022-10-08 DIAGNOSIS — D6869 Other thrombophilia: Secondary | ICD-10-CM | POA: Diagnosis not present

## 2022-10-08 DIAGNOSIS — I10 Essential (primary) hypertension: Secondary | ICD-10-CM | POA: Diagnosis not present

## 2022-10-08 DIAGNOSIS — I4819 Other persistent atrial fibrillation: Secondary | ICD-10-CM | POA: Diagnosis not present

## 2022-10-08 MED ORDER — LORAZEPAM 1 MG PO TABS: 1.0000 mg | ORAL_TABLET | Freq: Three times a day (TID) | ORAL | 0 refills | Status: DC | PRN
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
## 2022-10-08 NOTE — Patient Instructions (Signed)

## 2022-11-26 ENCOUNTER — Encounter: Payer: Self-pay | Admitting: Family Medicine

## 2022-11-26 ENCOUNTER — Ambulatory Visit (INDEPENDENT_AMBULATORY_CARE_PROVIDER_SITE_OTHER): Payer: PPO | Admitting: Family Medicine

## 2022-11-26 ENCOUNTER — Other Ambulatory Visit: Payer: Self-pay | Admitting: Family Medicine

## 2022-11-26 VITALS — BP 134/74 | HR 70 | Temp 97.2°F | Ht 61.0 in | Wt 135.0 lb

## 2022-11-26 DIAGNOSIS — Z1159 Encounter for screening for other viral diseases: Secondary | ICD-10-CM

## 2022-11-26 DIAGNOSIS — I152 Hypertension secondary to endocrine disorders: Secondary | ICD-10-CM | POA: Diagnosis not present

## 2022-11-26 DIAGNOSIS — E1159 Type 2 diabetes mellitus with other circulatory complications: Secondary | ICD-10-CM

## 2022-11-26 DIAGNOSIS — I428 Other cardiomyopathies: Secondary | ICD-10-CM

## 2022-11-26 DIAGNOSIS — E1169 Type 2 diabetes mellitus with other specified complication: Secondary | ICD-10-CM

## 2022-11-26 DIAGNOSIS — E782 Mixed hyperlipidemia: Secondary | ICD-10-CM | POA: Diagnosis not present

## 2022-11-26 DIAGNOSIS — I480 Paroxysmal atrial fibrillation: Secondary | ICD-10-CM | POA: Diagnosis not present

## 2022-11-26 DIAGNOSIS — G4733 Obstructive sleep apnea (adult) (pediatric): Secondary | ICD-10-CM | POA: Diagnosis not present

## 2022-11-26 MED ORDER — AMPHETAMINE-DEXTROAMPHETAMINE 20 MG PO TABS: 20.0000 mg | ORAL_TABLET | Freq: Every day | ORAL | 0 refills | Status: DC

## 2022-11-26 MED ORDER — VITAMIN D (ERGOCALCIFEROL) 1.25 MG (50000 UNIT) PO CAPS: 50000.0000 [IU] | ORAL_CAPSULE | ORAL | 2 refills | Status: AC

## 2022-11-26 MED ORDER — LORAZEPAM 1 MG PO TABS: 1.0000 mg | ORAL_TABLET | Freq: Three times a day (TID) | ORAL | 0 refills | Status: DC | PRN

## 2022-11-26 NOTE — Progress Notes (Unsigned)
Subjective:  Patient ID: Lindsay Guerrero, female    DOB: Nov 04, 1955  Age: 67 y.o. MRN: [id number]  Chief Complaint  Patient presents with   Medical Management of Chronic Issues    HPI Afib: She is taking acebutolol 200 mg daily, Diltiazem 30 mg four times a day as needed. She sees Dr Kirtland Bouchard due to afib. She had an ablation on 07/05/2022. Atrial appendage has been surgically closed, so patient discontinued her eliquis.     ADHD: She has been on Adderall 10-20 mg daily.   Hypertension: Takes acebutolol 200 mg daily, furosemide 20 mg daily.   Patient has a history of aortic valve regurgitation which required a aortic valve replacement with a bioprosthetic valve (21 mm Edwards Inspira's Resilia.)  Patient requires amoxicillin prophylaxis for any dental procedures.   GAD: Currently on lorazepam 1 mg 1 p.o. 3 times daily as needed.  Usually takes once daily.    Vitamin D deficiency: Currently on vitamin D 50 K weekly. Gets a large amount of calcium in diet.    Diabetes: Last A1C 6.9. Patient does not check her sugars.  Patient does try to eat healthy. Walks daily 1/2 - 2 miles.   Declined flu vaccine. Would like MD note stating she is not required to take it due to medical issues.     11/26/2022   10:40 AM 07/24/2022   10:57 AM  Depression screen PHQ 2/9  Decreased Interest 1 0  Down, Depressed, Hopeless 0 1  PHQ - 2 Score 1 1  Altered sleeping 2 3  Tired, decreased energy 1 3  Change in appetite 0 0  Feeling bad or failure about yourself  0 0  Trouble concentrating 0 0  Moving slowly or fidgety/restless 0 0  Suicidal thoughts 0 0  PHQ-9 Score 4 7  Difficult doing work/chores Not difficult at all Somewhat difficult        11/26/2022   10:40 AM  Fall Risk   Falls in the past year? 0  Number falls in past yr: 0  Injury with Fall? 0  Risk for fall due to : No Fall Risks  Follow up Falls evaluation completed    Patient Care Team: Blane Ohara, MD as PCP - General (Family  Medicine) Georgeanna Lea, MD as PCP - Cardiology (Cardiology) Regan Lemming, MD as PCP - Electrophysiology (Cardiology)   Review of Systems  Constitutional:  Negative for chills, fatigue and fever.  HENT:  Negative for congestion, ear pain, rhinorrhea and sore throat.   Respiratory:  Negative for cough and shortness of breath.   Cardiovascular:  Negative for chest pain.  Gastrointestinal:  Negative for abdominal pain, constipation, diarrhea, nausea and vomiting.  Genitourinary:  Negative for dysuria and urgency.  Musculoskeletal:  Negative for back pain and myalgias.  Neurological:  Negative for dizziness, weakness, light-headedness and headaches.  Psychiatric/Behavioral:  Negative for dysphoric mood. The patient is not nervous/anxious.     Current Outpatient Medications on File Prior to Visit  Medication Sig Dispense Refill   acebutolol (SECTRAL) 200 MG capsule Take 1 capsule (200 mg total) by mouth daily. 90 capsule 3   acetaminophen (TYLENOL) 500 MG tablet Take 500-1,000 mg by mouth every 6 (six) hours as needed for moderate pain.     Acetylcysteine (NAC PO) Take 1 tablet by mouth 2 (two) times daily.     Acetylcysteine (NAC) 600 MG CAPS Take 600-1,200 mg by mouth daily.     diltiazem (CARDIZEM) 30 MG tablet  Take 1 tablet (30 mg total) by mouth every 8 (eight) hours as needed (palpations). 60 tablet 1   diphenhydrAMINE (BENADRYL) 25 MG tablet Take 25 mg by mouth daily as needed for itching.     furosemide (LASIX) 20 MG tablet Take 20 mg by mouth daily.     Melatonin 10 MG TABS Take 3 mg by mouth at bedtime as needed (sleep).     Methylsulfonylmethane (MSM PO) Take 2 tablets by mouth 2 (two) times daily.     omeprazole (PRILOSEC OTC) 20 MG tablet Take 20 mg by mouth daily as needed (acid reflux).     ondansetron (ZOFRAN) 4 MG tablet Take 4 mg by mouth every 8 (eight) hours as needed for nausea or vomiting.     sodium chloride (OCEAN) 0.65 % SOLN nasal spray Place 1 spray  into both nostrils 2 (two) times daily.     triamcinolone cream (KENALOG) 0.1 % Apply 1 Application topically 2 (two) times daily as needed (psoriasis).     No current facility-administered medications on file prior to visit.   Past Medical History:  Diagnosis Date   Adult attention deficit disorder 05/15/2013   Anxiety state 11/19/2012   ICD10   Aortic stenosis 12/13/2017   Moderate by echocardiogram from 2019 mean gradient 25   Aortic stenosis, mild 07/13/2016   Moderate by echocardiogram from 2019 mean gradient 25   Aortic stenosis, severe 07/02/2019   Aortic valve sclerosis 05/24/2015   Formatting of this note might be different from the original. Without stenosis. Last Echo 2012   Asthma    mild   Atrial fibrillation (HCC) 06/28/2018   Atrial fibrillation with RVR (HCC) 06/28/2018   CHF (congestive heart failure) (HCC)    Chronic bilateral low back pain 01/27/2015   Depression    Diabetes mellitus without complication (HCC)    borderline   Dyspnea    on exertion   Dyspnea on exertion 11/01/2017   Elective surgery for purposes other than treating health conditions 09/27/2010   ICD10   Essential hypertension 06/30/2018   Family history of colon cancer 03/01/2005   Family history of premature coronary artery disease 03/01/2005   GAD (generalized anxiety disorder) 03/01/2005   Formatting of this note might be different from the original. with panic attacks   Hepatitis    due to cytomegalovirus-resolved   History of breast cancer 06/30/2018   History of hiatal hernia    mild,found on a CT   History of malignant neoplasm of breast 03/01/2005   Formatting of this note might be different from the original. node neg; see chartnotes for treatment   HTN (hypertension), benign 07/13/2016   Hyperlipidemia 06/30/2018   Hypokalemia 06/30/2018   Inflamed seborrheic keratosis 08/08/2012   Irritable bowel syndrome 03/01/2005   Malignant neoplasm of upper-inner quadrant of female  breast (HCC) 2003   Primary  diagnosis   Mild intermittent asthma without complication 03/01/2005   Formatting of this note might be different from the original. mild   Mitral regurgitation 12/13/2017   Mild to moderate based on echo from 2019   Mixed hyperlipidemia 07/20/2019   NICM (nonischemic cardiomyopathy) (HCC) 11/01/2015   Osteopenia 01/27/2015   Paroxysmal atrial fibrillation (HCC) 07/14/2019   Pneumonia 2018   Post menopausal problems 01/27/2015   Precordial pain 11/01/2015   Psoriatic arthritis (HCC) 04/25/2015   Recurrent genital HSV (herpes simplex virus) infection 03/01/2005   Formatting of this note might be different from the original. HSV genital   Restless  legs syndrome 03/01/2005   Rosacea 03/01/2005   Routine history and physical examination of adult 09/22/2012   Formatting of this note might be different from the original. PROBLEM LIST:  1. Left node negative breast cancer 2002, stage I, grade 1, status post  lumpectomy and node dissection, external beam radiation,  chemotherapy/Cytoxan, mild left arm lymphedema with cellulitis 2004.  2. Anxiety disorder with panic attacks, family history of same, globus  pharyngeus, palpitations, atypical chest pain, str   S/P aortic valve replacement with bioprosthetic valve 21 mm Edwards INSPIRIS RESILIA  07/02/2019   Size 21 mm   S/P AVR (aortic valve replacement) 07/20/2019   Seborrheic keratoses 09/10/2016   Sleep apnea    borderline   Vaginal dryness 01/27/2015   Past Surgical History:  Procedure Laterality Date   AORTIC VALVE REPLACEMENT N/A 07/02/2019   Procedure: AORTIC VALVE REPLACEMENT (AVR) USING INSPIRIS VALVE SIZE ;  Surgeon: Alleen Borne, MD;  Location: [facility] OR;  Service: Open Heart Surgery;  Laterality: N/A;   ATRIAL FIBRILLATION ABLATION N/A 07/05/2022   Procedure: ATRIAL FIBRILLATION ABLATION;  Surgeon: Regan Lemming, MD;  Location: [facility] INVASIVE CV LAB;  Service: Cardiovascular;  Laterality: N/A;    BREAST LUMPECTOMY Left 2003   sentinal node bx.   CARDIOVERSION N/A 07/10/2019   Procedure: CARDIOVERSION;  Surgeon: Little Ishikawa, MD;  Location: [facility] ENDOSCOPY;  Service: Cardiovascular;  Laterality: N/A;   CESAREAN SECTION     CLIPPING OF ATRIAL APPENDAGE N/A 07/02/2019   Procedure: CLIPPING OF ATRIAL APPENDAGE USING PRO2 CLIP SIZE ;  Surgeon: Alleen Borne, MD;  Location: [facility] OR;  Service: Open Heart Surgery;  Laterality: N/A;   COSMETIC SURGERY     IR THORACENTESIS ASP PLEURAL SPACE W/IMG GUIDE  07/07/2019   RIGHT/LEFT HEART CATH AND CORONARY ANGIOGRAPHY N/A 05/20/2019   Procedure: RIGHT/LEFT HEART CATH AND CORONARY ANGIOGRAPHY;  Surgeon: Tonny Bollman, MD;  Location: [facility] INVASIVE CV LAB;  Service: Cardiovascular;  Laterality: N/A;   TEE WITHOUT CARDIOVERSION N/A 07/02/2019   Procedure: TRANSESOPHAGEAL ECHOCARDIOGRAM (TEE);  Surgeon: Alleen Borne, MD;  Location: [facility] OR;  Service: Open Heart Surgery;  Laterality: N/A;   TONSILLECTOMY      Family History  Problem Relation Age of Onset   Heart Problems Mother    Hypertension Mother    Heart disease Mother    Heart Problems Father    Heart disease Father    Heart disease Sister    Heart attack Sister    Blindness Daughter    Diabetes Daughter    Developmental delay Daughter    Colon cancer Maternal Grandmother    Heart disease Maternal Grandmother    AAA (abdominal aortic aneurysm) Paternal Grandfather    Social History   Socioeconomic History   Marital status: Married    Spouse name: Not on file   Number of children: 5   Years of education: Not on file   Highest education level: Associate degree: academic program  Occupational History   Occupation: retired  Tobacco Use   Smoking status: Never   Smokeless tobacco: Never  Vaping Use   Vaping status: Never Used  Substance and Sexual Activity   Alcohol use: Yes    Comment: occasional social drinking (once per month)    Drug use: Yes    Types: Marijuana     Comment: smoked college years, chew THC gummies   Sexual activity: Yes    Partners: Male    Birth control/protection: None  Other Topics  Concern   Not on file  Social History Narrative   Not on file   Social Determinants of Health   Financial Resource Strain: Low Risk  (07/30/2022)   Overall Financial Resource Strain (CARDIA)    Difficulty of Paying Living Expenses: Not very hard  Food Insecurity: No Food Insecurity (07/30/2022)   Hunger Vital Sign    Worried About Running Out of Food in the Last Year: Never true    Ran Out of Food in the Last Year: Never true  Transportation Needs: No Transportation Needs (07/30/2022)   PRAPARE - Administrator, Civil Service (Medical): No    Lack of Transportation (Non-Medical): No  Physical Activity: Sufficiently Active (07/30/2022)   Exercise Vital Sign    Days of Exercise per Week: 5 days    Minutes of Exercise per Session: 30 min  Stress: Stress Concern Present (07/30/2022)   [employer] of Occupational Health - Occupational Stress Questionnaire    Feeling of Stress : To some extent  Social Connections: Socially Integrated (07/30/2022)   Social Connection and Isolation Panel [NHANES]    Frequency of Communication with Friends and Family: More than three times a week    Frequency of Social Gatherings with Friends and Family: More than three times a week    Attends Religious Services: More than 4 times per year    Active Member of [employer] or Organizations: Yes    Attends Engineer, structural: More than 4 times per year    Marital Status: Married    Objective:  BP 134/74   Pulse 70   Temp (!) 97.2 F (36.2 C)   Ht 5\' 1"  (1.549 m)   Wt 135 lb (61.2 kg)   LMP  (LMP Unknown)   SpO2 100%   BMI 25.51 kg/m      11/26/2022   10:32 AM 10/08/2022   10:31 AM 08/28/2022   11:15 AM  BP/Weight  Systolic BP 134 124 124  Diastolic BP 74 84 78  Wt. (Lbs) 135 134 131  BMI 25.51 kg/m2 25.32 kg/m2 24.75 kg/m2    Physical  Exam Vitals reviewed.  Constitutional:      Appearance: Normal appearance. She is normal weight.  Neck:     Vascular: No carotid bruit.  Cardiovascular:     Rate and Rhythm: Normal rate and regular rhythm.     Heart sounds: Normal heart sounds.  Pulmonary:     Effort: Pulmonary effort is normal. No respiratory distress.     Breath sounds: Normal breath sounds.  Abdominal:     General: Abdomen is flat. Bowel sounds are normal.     Palpations: Abdomen is soft.     Tenderness: There is no abdominal tenderness.  Neurological:     Mental Status: She is alert and oriented to person, place, and time.  Psychiatric:        Mood and Affect: Mood normal.        Behavior: Behavior normal.     Diabetic Foot Exam - Simple   Simple Foot Form  11/26/2022 11:38 AM  Visual Inspection No deformities, no ulcerations, no other skin breakdown bilaterally: Yes Sensation Testing Intact to touch and monofilament testing bilaterally: Yes Pulse Check Posterior Tibialis and Dorsalis pulse intact bilaterally: Yes Comments      Lab Results  Component Value Date   WBC 6.1 11/26/2022   HGB 13.3 11/26/2022   HCT 42.3 11/26/2022   PLT 280 11/26/2022   GLUCOSE 144 (H) 11/26/2022  CHOL 246 (H) 11/26/2022   TRIG 116 11/26/2022   HDL 50 11/26/2022   LDLDIRECT 166 (H) 09/15/2020   LDLCALC 175 (H) 11/26/2022   ALT 13 11/26/2022   AST 14 11/26/2022   NA 137 11/26/2022   K 4.7 11/26/2022   CL 94 (L) 11/26/2022   CREATININE 0.83 11/26/2022   BUN 17 11/26/2022   CO2 25 11/26/2022   TSH 2.030 11/26/2022   INR 1.6 (H) 07/02/2019   HGBA1C 6.6 (H) 11/26/2022      Assessment & Plan:    Hypertension associated with diabetes (HCC) Assessment & Plan: Hypertension is well-controlled.  Recommend check feet daily. Recommend annual eye exams. Medicines: None.   Continue to work on eating a healthy diet and exercise.  Labs drawn today  Orders: -     CBC with Differential/Platelet -      Comprehensive metabolic panel  Paroxysmal atrial fibrillation (HCC) Assessment & Plan: Management per specialist. Appears to be in normal sinus rhythm on exam.  Continue Eliquis, diltiazem.  Has diltiazem immediate release 30 mg daily as needed. Also is on Acebutolol 200 mg daily.   NICM (nonischemic cardiomyopathy) (HCC) Assessment & Plan: Management per specialist.     Mixed diabetic hyperlipidemia associated with type 2 diabetes mellitus (HCC) Assessment & Plan: Control: diabetes good. Cholesterol poorly controlled.  Recommend check feet daily. Recommend annual eye exams. Medicines: Recommend crestor 10 mg before bed. If patient refuses, recommend zetia 10 mg daily.  Continue to work on eating a healthy diet and exercise.  Labs drawn today.     Orders: -     Hemoglobin A1c -     Lipid panel -     TSH -     Microalbumin / creatinine urine ratio  OSA (obstructive sleep apnea) Assessment & Plan: Stable.  Orders: -     Ambulatory referral to Cardiology  Encounter for hepatitis C screening test for low risk patient -     HCV Ab w Reflex to Quant PCR  Other orders -     Amphetamine-Dextroamphetamine; Take 1 tablet (20 mg total) by mouth daily.  Dispense: 30 tablet; Refill: 0 -     LORazepam; Take 1 tablet (1 mg total) by mouth every 8 (eight) hours as needed for anxiety or sleep.  Dispense: 90 tablet; Refill: 0 -     Vitamin D (Ergocalciferol); Take 1 capsule (50,000 Units total) by mouth every Monday.  Dispense: 5 capsule; Refill: 2     Meds ordered this encounter  Medications   amphetamine-dextroamphetamine (ADDERALL) 20 MG tablet    Sig: Take 1 tablet (20 mg total) by mouth daily.    Dispense:  30 tablet    Refill:  0   LORazepam (ATIVAN) 1 MG tablet    Sig: Take 1 tablet (1 mg total) by mouth every 8 (eight) hours as needed for anxiety or sleep.    Dispense:  90 tablet    Refill:  0   Vitamin D, Ergocalciferol, (DRISDOL) 1.25 MG (50000 UNIT) CAPS capsule     Sig: Take 1 capsule (50,000 Units total) by mouth every Monday.    Dispense:  5 capsule    Refill:  2    Orders Placed This Encounter  Procedures   CBC with Differential/Platelet   Comprehensive metabolic panel   Hemoglobin A1c   Lipid panel   TSH   Microalbumin / creatinine urine ratio   HCV Ab w Reflex to Quant PCR   Ambulatory referral to Cardiology  Follow-up: Return in about 3 months (around 02/25/2023) for chronic follow up.   I,Katherina A Bramblett,acting as a scribe for Blane Ohara, MD.,have documented all relevant documentation on the behalf of Blane Ohara, MD,as directed by  Blane Ohara, MD while in the presence of Blane Ohara, MD.    Clayborn Bigness I Leal-Borjas,acting as a scribe for Blane Ohara, MD.,have documented all relevant documentation on the behalf of Blane Ohara, MD,as directed by  Blane Ohara, MD while in the presence of Blane Ohara, MD.   An After Visit Summary was printed and given to the patient.  Blane Ohara, MD [facility] [phone]

## 2022-11-26 NOTE — Patient Instructions (Addendum)
Discuss with pharmacist about getting Tdap vaccine and shingrix vaccine series.

## 2022-11-27 LAB — HCV AB W REFLEX TO QUANT PCR: HCV Ab: NONREACTIVE

## 2022-11-27 LAB — CBC WITH DIFFERENTIAL/PLATELET
Basophils Absolute: 0 10*3/uL (ref 0.0–0.2)
Basos: 1 %
EOS (ABSOLUTE): 0.1 10*3/uL (ref 0.0–0.4)
Eos: 1 %
Hematocrit: 42.3 % (ref 34.0–46.6)
Hemoglobin: 13.3 g/dL (ref 11.1–15.9)
Immature Grans (Abs): 0 10*3/uL (ref 0.0–0.1)
Immature Granulocytes: 0 %
Lymphocytes Absolute: 1.3 10*3/uL (ref 0.7–3.1)
Lymphs: 21 %
MCH: 28.9 pg (ref 26.6–33.0)
MCHC: 31.4 g/dL — ABNORMAL LOW (ref 31.5–35.7)
MCV: 92 fL (ref 79–97)
Monocytes Absolute: 0.5 10*3/uL (ref 0.1–0.9)
Monocytes: 8 %
Neutrophils Absolute: 4.2 10*3/uL (ref 1.4–7.0)
Neutrophils: 69 %
Platelets: 280 10*3/uL (ref 150–450)
RBC: 4.61 x10E6/uL (ref 3.77–5.28)
RDW: 13.9 % (ref 11.7–15.4)
WBC: 6.1 10*3/uL (ref 3.4–10.8)

## 2022-11-27 LAB — HEMOGLOBIN A1C
Est. average glucose Bld gHb Est-mCnc: 143 mg/dL
Hgb A1c MFr Bld: 6.6 % — ABNORMAL HIGH (ref 4.8–5.6)

## 2022-11-27 LAB — COMPREHENSIVE METABOLIC PANEL
ALT: 13 IU/L (ref 0–32)
AST: 14 IU/L (ref 0–40)
Albumin: 4.6 g/dL (ref 3.9–4.9)
Alkaline Phosphatase: 74 IU/L (ref 44–121)
BUN/Creatinine Ratio: 20 (ref 12–28)
BUN: 17 mg/dL (ref 8–27)
Bilirubin Total: 0.3 mg/dL (ref 0.0–1.2)
CO2: 25 mmol/L (ref 20–29)
Calcium: 9.9 mg/dL (ref 8.7–10.3)
Chloride: 94 mmol/L — ABNORMAL LOW (ref 96–106)
Creatinine, Ser: 0.83 mg/dL (ref 0.57–1.00)
Globulin, Total: 2.6 g/dL (ref 1.5–4.5)
Glucose: 144 mg/dL — ABNORMAL HIGH (ref 70–99)
Potassium: 4.7 mmol/L (ref 3.5–5.2)
Sodium: 137 mmol/L (ref 134–144)
Total Protein: 7.2 g/dL (ref 6.0–8.5)
eGFR: 77 mL/min/{1.73_m2} (ref >59)

## 2022-11-27 LAB — LIPID PANEL
Chol/HDL Ratio: 4.9 ratio — ABNORMAL HIGH (ref 0.0–4.4)
Cholesterol, Total: 246 mg/dL — ABNORMAL HIGH (ref 100–199)
HDL: 50 mg/dL (ref >39)
LDL Chol Calc (NIH): 175 mg/dL — ABNORMAL HIGH (ref 0–99)
Triglycerides: 116 mg/dL (ref 0–149)
VLDL Cholesterol Cal: 21 mg/dL (ref 5–40)

## 2022-11-27 LAB — TSH: TSH: 2.03 u[IU]/mL (ref 0.450–4.500)

## 2022-11-29 ENCOUNTER — Encounter: Payer: Self-pay | Admitting: Family Medicine

## 2022-12-01 DIAGNOSIS — E1169 Type 2 diabetes mellitus with other specified complication: Secondary | ICD-10-CM | POA: Insufficient documentation

## 2022-12-01 DIAGNOSIS — E119 Type 2 diabetes mellitus without complications: Secondary | ICD-10-CM | POA: Insufficient documentation

## 2022-12-01 NOTE — Assessment & Plan Note (Signed)
Hypertension is well-controlled.  Recommend check feet daily. Recommend annual eye exams. Medicines: None.   Continue to work on eating a healthy diet and exercise.  Labs drawn today

## 2022-12-01 NOTE — Assessment & Plan Note (Signed)
Control:  Recommend check sugars fasting daily. Recommend check feet daily. Recommend annual eye exams. Medicines: none Continue to work on eating a healthy diet and exercise.  Labs drawn today.

## 2022-12-01 NOTE — Assessment & Plan Note (Signed)
Management per specialist. Appears to be in normal sinus rhythm on exam.  Continue Eliquis, diltiazem.  Has diltiazem immediate release 30 mg daily as needed. Also is on Acebutolol 200 mg daily.

## 2022-12-01 NOTE — Assessment & Plan Note (Signed)
Management per specialist. 

## 2022-12-01 NOTE — Assessment & Plan Note (Signed)
Stable

## 2022-12-27 ENCOUNTER — Telehealth: Payer: Self-pay | Admitting: *Deleted

## 2022-12-27 NOTE — Telephone Encounter (Signed)
Late Entry:  Spoke to pt on 9/4 about sleep apnea/study. She just had a sleep study this past summer. Pt is going to discuss CPAP w/ her PCP at her next appt.

## 2023-01-03 ENCOUNTER — Ambulatory Visit: Payer: PPO | Admitting: Physician Assistant

## 2023-01-03 ENCOUNTER — Encounter: Payer: Self-pay | Admitting: Physician Assistant

## 2023-01-03 VITALS — BP 110/72 | HR 69 | Temp 96.8°F | Ht 61.0 in | Wt 132.6 lb

## 2023-01-03 DIAGNOSIS — J309 Allergic rhinitis, unspecified: Secondary | ICD-10-CM | POA: Insufficient documentation

## 2023-01-03 DIAGNOSIS — J029 Acute pharyngitis, unspecified: Secondary | ICD-10-CM | POA: Diagnosis not present

## 2023-01-03 DIAGNOSIS — J3089 Other allergic rhinitis: Secondary | ICD-10-CM | POA: Diagnosis not present

## 2023-01-03 LAB — POC COVID19 BINAXNOW: SARS Coronavirus 2 Ag: NEGATIVE

## 2023-01-03 MED ORDER — FLUTICASONE PROPIONATE 50 MCG/ACT NA SUSP: 2.0000 | Freq: Every day | NASAL | 6 refills | Status: AC

## 2023-01-03 NOTE — Progress Notes (Signed)
Acute Office Visit  Subjective:    Patient ID: Lindsay Guerrero, female    DOB: February 26, 1956, 67 y.o.   MRN: [id number]  Chief Complaint  Patient presents with   Sore Throat   Nasal Congestion    HPI: Patient is in today for complaints of persistent sore throat for the past 3 weeks.  She states she has also had slight cough and minimal congestion.  Has taken some tylenol but no other meds .  Has occasional reflux but nothing daily and uses prilosec prn Denies fever   Current Outpatient Medications:    acebutolol (SECTRAL) 200 MG capsule, Take 1 capsule (200 mg total) by mouth daily., Disp: 90 capsule, Rfl: 3   acetaminophen (TYLENOL) 500 MG tablet, Take 500-1,000 mg by mouth every 6 (six) hours as needed for moderate pain., Disp: , Rfl:    Acetylcysteine (NAC PO), Take 1 tablet by mouth 2 (two) times daily., Disp: , Rfl:    Acetylcysteine (NAC) 600 MG CAPS, Take 600-1,200 mg by mouth daily., Disp: , Rfl:    amphetamine-dextroamphetamine (ADDERALL) 20 MG tablet, Take 1 tablet (20 mg total) by mouth daily., Disp: 30 tablet, Rfl: 0   diltiazem (CARDIZEM) 30 MG tablet, Take 1 tablet (30 mg total) by mouth every 8 (eight) hours as needed (palpations)., Disp: 60 tablet, Rfl: 1   diphenhydrAMINE (BENADRYL) 25 MG tablet, Take 25 mg by mouth daily as needed for itching., Disp: , Rfl:    fluticasone (FLONASE) 50 MCG/ACT nasal spray, Place 2 sprays into both nostrils daily., Disp: 16 g, Rfl: 6   furosemide (LASIX) 20 MG tablet, Take 20 mg by mouth daily., Disp: , Rfl:    LORazepam (ATIVAN) 1 MG tablet, Take 1 tablet (1 mg total) by mouth every 8 (eight) hours as needed for anxiety or sleep., Disp: 90 tablet, Rfl: 0   Melatonin 10 MG TABS, Take 3 mg by mouth at bedtime as needed (sleep)., Disp: , Rfl:    Methylsulfonylmethane (MSM PO), Take 2 tablets by mouth 2 (two) times daily., Disp: , Rfl:    omeprazole (PRILOSEC OTC) 20 MG tablet, Take 20 mg by mouth daily as needed (acid reflux)., Disp: , Rfl:     ondansetron (ZOFRAN) 4 MG tablet, Take 4 mg by mouth every 8 (eight) hours as needed for nausea or vomiting., Disp: , Rfl:    sodium chloride (OCEAN) 0.65 % SOLN nasal spray, Place 1 spray into both nostrils 2 (two) times daily., Disp: , Rfl:    triamcinolone cream (KENALOG) 0.1 %, Apply 1 Application topically 2 (two) times daily as needed (psoriasis)., Disp: , Rfl:    Vitamin D, Ergocalciferol, (DRISDOL) 1.25 MG (50000 UNIT) CAPS capsule, Take 1 capsule (50,000 Units total) by mouth every Monday., Disp: 5 capsule, Rfl: 2  Allergies  Allergen Reactions   Lisinopril Cough   Metoprolol Rash and Other (See Comments)    ROS CONSTITUTIONAL: Negative for chills, fatigue, fever, unintentional weight gain and unintentional weight loss.  E/N/T: see HPI  CARDIOVASCULAR: Negative for chest pain, RESPIRATORY: see HPI GASTROINTESTINAL: see HPI       Objective:    PHYSICAL EXAM:   BP 110/72 (BP Location: Left Arm, Patient Position: Sitting, Cuff Size: Normal)   Pulse 69   Temp (!) 96.8 F (36 C) (Temporal)   Ht 5\' 1"  (1.549 m)   Wt 132 lb 9.6 oz (60.1 kg)   LMP  (LMP Unknown)   SpO2 99%   BMI 25.05 kg/m  GEN: Well nourished, well developed, in no acute distress  HEENT: normal external ears and nose - normal external auditory canals and TMS - hearing grossly normal - normal nasal mucosa and septum - Lips, Teeth and Gums - normal  Oropharynx -minimal erythema with no pnd or exudate  Cardiac: RRR; no murmurs, Respiratory:  normal respiratory rate and pattern with no distress - normal breath sounds with no rales, rhonchi, wheezes or rubs  Office Visit on 01/03/2023  Component Date Value Ref Range Status   SARS Coronavirus 2 Ag 01/03/2023 Negative  Negative Final        Assessment & Plan:    Pharyngitis, unspecified etiology -     POC COVID-19 BinaxNow Recommend salt water gargles Can take zyrtec qd Allergic rhinitis due to other allergic trigger, unspecified  seasonality -     Fluticasone Propionate; Place 2 sprays into both nostrils daily.  Dispense: 16 g; Refill: 6     Follow-up: Return if symptoms worsen or fail to improve.  An After Visit Summary was printed and given to the patient.  Jettie Pagan [facility] [phone]

## 2023-01-17 ENCOUNTER — Encounter: Payer: Self-pay | Admitting: Family Medicine

## 2023-02-18 ENCOUNTER — Other Ambulatory Visit: Payer: Self-pay | Admitting: Family Medicine

## 2023-02-18 MED ORDER — LORAZEPAM 1 MG PO TABS: 1.0000 mg | ORAL_TABLET | Freq: Three times a day (TID) | ORAL | 0 refills | Status: DC | PRN

## 2023-03-10 NOTE — Progress Notes (Unsigned)
Subjective:  Patient ID: ADALENE SUMPTION, female    DOB: 05/29/1955  Age: 67 y.o. MRN: [id number]  Chief Complaint  Patient presents with   Medical Management of Chronic Issues    HPI   Afib: She is taking acebutolol 200 mg daily, Diltiazem 30 mg four times a day as needed. She sees Dr Kirtland Bouchard due to afib. She had an ablation on 07/05/2022. Atrial appendage has been surgically closed, so patient discontinued her eliquis.     ADHD: She has been on Adderall 10-20 mg daily.   Hypertension: Takes acebutolol 200 mg daily, furosemide 20 mg daily.   Patient has a history of aortic valve regurgitation which required a aortic valve replacement with a bioprosthetic valve (21 mm Edwards Inspira's Resilia.)  Patient requires amoxicillin prophylaxis for any dental procedures.   GAD: Currently on lorazepam 1 mg 1 p.o. 3 times daily as needed.  Usually takes once daily.    Vitamin D deficiency: Currently on vitamin D 50 K weekly. Gets a large amount of calcium in diet.    Diabetes: Last A1C 6.6. Patient does not check her sugars.  Patient does try to eat healthy. Walks daily 1/2 - 2 miles.      11/26/2022   10:40 AM 07/24/2022   10:57 AM  Depression screen PHQ 2/9  Decreased Interest 1 0  Down, Depressed, Hopeless 0 1  PHQ - 2 Score 1 1  Altered sleeping 2 3  Tired, decreased energy 1 3  Change in appetite 0 0  Feeling bad or failure about yourself  0 0  Trouble concentrating 0 0  Moving slowly or fidgety/restless 0 0  Suicidal thoughts 0 0  PHQ-9 Score 4 7  Difficult doing work/chores Not difficult at all Somewhat difficult        11/26/2022   10:40 AM  Fall Risk   Falls in the past year? 0  Number falls in past yr: 0  Injury with Fall? 0  Risk for fall due to : No Fall Risks  Follow up Falls evaluation completed    Patient Care Team: Blane Ohara, MD as PCP - General (Family Medicine) Georgeanna Lea, MD as PCP - Cardiology (Cardiology) Regan Lemming, MD as PCP -  Electrophysiology (Cardiology)   Review of Systems  Current Outpatient Medications on File Prior to Visit  Medication Sig Dispense Refill   acebutolol (SECTRAL) 200 MG capsule Take 1 capsule (200 mg total) by mouth daily. 90 capsule 3   acetaminophen (TYLENOL) 500 MG tablet Take 500-1,000 mg by mouth every 6 (six) hours as needed for moderate pain.     Acetylcysteine (NAC PO) Take 1 tablet by mouth 2 (two) times daily.     Acetylcysteine (NAC) 600 MG CAPS Take 600-1,200 mg by mouth daily.     amphetamine-dextroamphetamine (ADDERALL) 20 MG tablet Take 1 tablet (20 mg total) by mouth daily. 30 tablet 0   diltiazem (CARDIZEM) 30 MG tablet Take 1 tablet (30 mg total) by mouth every 8 (eight) hours as needed (palpations). 60 tablet 1   diphenhydrAMINE (BENADRYL) 25 MG tablet Take 25 mg by mouth daily as needed for itching.     fluticasone (FLONASE) 50 MCG/ACT nasal spray Place 2 sprays into both nostrils daily. 16 g 6   furosemide (LASIX) 20 MG tablet Take 20 mg by mouth daily.     LORazepam (ATIVAN) 1 MG tablet Take 1 tablet (1 mg total) by mouth every 8 (eight) hours as needed for anxiety  or sleep. 90 tablet 0   Melatonin 10 MG TABS Take 3 mg by mouth at bedtime as needed (sleep).     Methylsulfonylmethane (MSM PO) Take 2 tablets by mouth 2 (two) times daily.     omeprazole (PRILOSEC OTC) 20 MG tablet Take 20 mg by mouth daily as needed (acid reflux).     ondansetron (ZOFRAN) 4 MG tablet Take 4 mg by mouth every 8 (eight) hours as needed for nausea or vomiting.     sodium chloride (OCEAN) 0.65 % SOLN nasal spray Place 1 spray into both nostrils 2 (two) times daily.     triamcinolone cream (KENALOG) 0.1 % Apply 1 Application topically 2 (two) times daily as needed (psoriasis).     Vitamin D, Ergocalciferol, (DRISDOL) 1.25 MG (50000 UNIT) CAPS capsule Take 1 capsule (50,000 Units total) by mouth every Monday. 5 capsule 2   No current facility-administered medications on file prior to visit.    Past Medical History:  Diagnosis Date   Adult attention deficit disorder 05/15/2013   Anxiety state 11/19/2012   ICD10   Aortic stenosis 12/13/2017   Moderate by echocardiogram from 2019 mean gradient 25   Aortic stenosis, mild 07/13/2016   Moderate by echocardiogram from 2019 mean gradient 25   Aortic stenosis, severe 07/02/2019   Aortic valve sclerosis 05/24/2015   Formatting of this note might be different from the original. Without stenosis. Last Echo 2012   Asthma    mild   Atrial fibrillation (HCC) 06/28/2018   Atrial fibrillation with RVR (HCC) 06/28/2018   CHF (congestive heart failure) (HCC)    Chronic bilateral low back pain 01/27/2015   Depression    Diabetes mellitus without complication (HCC)    borderline   Dyspnea    on exertion   Dyspnea on exertion 11/01/2017   Elective surgery for purposes other than treating health conditions 09/27/2010   ICD10   Essential hypertension 06/30/2018   Family history of colon cancer 03/01/2005   Family history of premature coronary artery disease 03/01/2005   GAD (generalized anxiety disorder) 03/01/2005   Formatting of this note might be different from the original. with panic attacks   Hepatitis    due to cytomegalovirus-resolved   History of breast cancer 06/30/2018   History of hiatal hernia    mild,found on a CT   History of malignant neoplasm of breast 03/01/2005   Formatting of this note might be different from the original. node neg; see chartnotes for treatment   HTN (hypertension), benign 07/13/2016   Hyperlipidemia 06/30/2018   Hypokalemia 06/30/2018   Inflamed seborrheic keratosis 08/08/2012   Irritable bowel syndrome 03/01/2005   Malignant neoplasm of upper-inner quadrant of female breast (HCC) 2003   Primary  diagnosis   Mild intermittent asthma without complication 03/01/2005   Formatting of this note might be different from the original. mild   Mitral regurgitation 12/13/2017   Mild to moderate based  on echo from 2019   Mixed hyperlipidemia 07/20/2019   NICM (nonischemic cardiomyopathy) (HCC) 11/01/2015   Osteopenia 01/27/2015   Paroxysmal atrial fibrillation (HCC) 07/14/2019   Pneumonia 2018   Post menopausal problems 01/27/2015   Precordial pain 11/01/2015   Psoriatic arthritis (HCC) 04/25/2015   Recurrent genital HSV (herpes simplex virus) infection 03/01/2005   Formatting of this note might be different from the original. HSV genital   Restless legs syndrome 03/01/2005   Rosacea 03/01/2005   Routine history and physical examination of adult 09/22/2012   Formatting of this  note might be different from the original. PROBLEM LIST:  1. Left node negative breast cancer 2002, stage I, grade 1, status post  lumpectomy and node dissection, external beam radiation,  chemotherapy/Cytoxan, mild left arm lymphedema with cellulitis 2004.  2. Anxiety disorder with panic attacks, family history of same, globus  pharyngeus, palpitations, atypical chest pain, str   S/P aortic valve replacement with bioprosthetic valve 21 mm Edwards INSPIRIS RESILIA  07/02/2019   Size 21 mm   S/P AVR (aortic valve replacement) 07/20/2019   Seborrheic keratoses 09/10/2016   Sleep apnea    borderline   Vaginal dryness 01/27/2015   Past Surgical History:  Procedure Laterality Date   AORTIC VALVE REPLACEMENT N/A 07/02/2019   Procedure: AORTIC VALVE REPLACEMENT (AVR) USING INSPIRIS VALVE SIZE ;  Surgeon: Alleen Borne, MD;  Location: [facility] OR;  Service: Open Heart Surgery;  Laterality: N/A;   ATRIAL FIBRILLATION ABLATION N/A 07/05/2022   Procedure: ATRIAL FIBRILLATION ABLATION;  Surgeon: Regan Lemming, MD;  Location: [facility] INVASIVE CV LAB;  Service: Cardiovascular;  Laterality: N/A;   BREAST LUMPECTOMY Left 2003   sentinal node bx.   CARDIOVERSION N/A 07/10/2019   Procedure: CARDIOVERSION;  Surgeon: Little Ishikawa, MD;  Location: [facility] ENDOSCOPY;  Service: Cardiovascular;  Laterality: N/A;   CESAREAN  SECTION     CLIPPING OF ATRIAL APPENDAGE N/A 07/02/2019   Procedure: CLIPPING OF ATRIAL APPENDAGE USING PRO2 CLIP SIZE ;  Surgeon: Alleen Borne, MD;  Location: [facility] OR;  Service: Open Heart Surgery;  Laterality: N/A;   COSMETIC SURGERY     IR THORACENTESIS ASP PLEURAL SPACE W/IMG GUIDE  07/07/2019   RIGHT/LEFT HEART CATH AND CORONARY ANGIOGRAPHY N/A 05/20/2019   Procedure: RIGHT/LEFT HEART CATH AND CORONARY ANGIOGRAPHY;  Surgeon: Tonny Bollman, MD;  Location: [facility] INVAS[facility] CV LAB;  Service: Cardiovascular;  Laterality: N/A;   TEE WITHOUT CARDIOVERSION N/A 07/02/2019   Procedure: TRANSESOPHAGEAL ECHOCARDIOGRAM (TEE);  Surgeon: Alleen Borne, MD;  Location: [facility] OR;  Service: Open Heart Surgery;  Laterality: N/A;   TONSILLECTOMY      Family History  Problem Relation Age of Onset   Heart Problems Mother    Hypertension Mother    Heart disease Mother    Heart Problems Father    Heart disease Father    Heart disease Sister    Heart attack Sister    Blindness Daughter    Diabetes Daughter    Developmental delay Daughter    Colon cancer Maternal Grandmother    Heart disease Maternal Grandmother    AAA (abdominal aortic aneurysm) Paternal Grandfather    Social History   Socioeconomic History   Marital status: Married    Spouse name: Not on file   Number of children: 5   Years of education: Not on file   Highest education level: Associate degree: academic program  Occupational History   Occupation: retired  Tobacco Use   Smoking status: Never   Smokeless tobacco: Never  Vaping Use   Vaping status: Never Used  Substance and Sexual Activity   Alcohol use: Yes    Comment: occasional social drinking (once per month)    Drug use: Yes    Types: Marijuana    Comment: smoked college years, chew THC gummies   Sexual activity: Yes    Partners: Male    Birth control/protection: None  Other Topics Concern   Not on file  Social History Narrative   Not on file   Social Drivers of  Health  Financial Resource Strain: Low Risk  (07/30/2022)   Overall Financial Resource Strain (CARDIA)    Difficulty of Paying Living Expenses: Not very hard  Food Insecurity: No Food Insecurity (07/30/2022)   Hunger Vital Sign    Worried About Running Out of Food in the Last Year: Never true    Ran Out of Food in the Last Year: Never true  Transportation Needs: No Transportation Needs (07/30/2022)   PRAPARE - Administrator, Civil Service (Medical): No    Lack of Transportation (Non-Medical): No  Physical Activity: Sufficiently Active (07/30/2022)   Exercise Vital Sign    Days of Exercise per Week: 5 days    Minutes of Exercise per Session: 30 min  Stress: Stress Concern Present (07/30/2022)   [employer] of Occupational Health - Occupational Stress Questionnaire    Feeling of Stress : To some extent  Social Connections: Socially Integrated (07/30/2022)   Social Connection and Isolation Panel [NHANES]    Frequency of Communication with Friends and Family: More than three times a week    Frequency of Social Gatherings with Friends and Family: More than three times a week    Attends Religious Services: More than 4 times per year    Active Member of Clubs or Organizations: Yes    Attends Banker Meetings: More than 4 times per year    Marital Status: Married    Objective:  LMP  (LMP Unknown)      01/03/2023    9:51 AM 11/26/2022   10:32 AM 10/08/2022   10:31 AM  BP/Weight  Systolic BP 110 134 124  Diastolic BP 72 74 84  Wt. (Lbs) 132.6 135 134  BMI 25.05 kg/m2 25.51 kg/m2 25.32 kg/m2    Physical Exam  Diabetic Foot Exam - Simple   No data filed      Lab Results  Component Value Date   WBC 6.1 11/26/2022   HGB 13.3 11/26/2022   HCT 42.3 11/26/2022   PLT 280 11/26/2022   GLUCOSE 144 (H) 11/26/2022   CHOL 246 (H) 11/26/2022   TRIG 116 11/26/2022   HDL 50 11/26/2022   LDLDIRECT 166 (H) 09/15/2020   LDLCALC 175 (H) 11/26/2022   ALT 13  11/26/2022   AST 14 11/26/2022   NA 137 11/26/2022   K 4.7 11/26/2022   CL 94 (L) 11/26/2022   CREATININE 0.83 11/26/2022   BUN 17 11/26/2022   CO2 25 11/26/2022   TSH 2.030 11/26/2022   INR 1.6 (H) 07/02/2019   HGBA1C 6.6 (H) 11/26/2022      Assessment & Plan:    There are no diagnoses linked to this encounter.   No orders of the defined types were placed in this encounter.   No orders of the defined types were placed in this encounter.    Follow-up: No follow-ups on file.   I,Raekwan Spelman I Leal-Borjas,acting as a scribe for Blane Ohara, MD.,have documented all relevant documentation on the behalf of Blane Ohara, MD,as directed by  Blane Ohara, MD while in the presence of Blane Ohara, MD.   An After Visit Summary was printed and given to the patient.  Blane Ohara, MD [facility] [phone]

## 2023-03-11 ENCOUNTER — Encounter: Payer: PPO | Admitting: Family Medicine

## 2023-03-11 DIAGNOSIS — I152 Hypertension secondary to endocrine disorders: Secondary | ICD-10-CM

## 2023-03-11 DIAGNOSIS — I480 Paroxysmal atrial fibrillation: Secondary | ICD-10-CM

## 2023-03-11 DIAGNOSIS — E782 Mixed hyperlipidemia: Secondary | ICD-10-CM

## 2023-03-11 DIAGNOSIS — G4733 Obstructive sleep apnea (adult) (pediatric): Secondary | ICD-10-CM

## 2023-03-21 DIAGNOSIS — K080 Exfoliation of teeth due to systemic causes: Secondary | ICD-10-CM | POA: Diagnosis not present

## 2023-03-28 ENCOUNTER — Ambulatory Visit: Payer: Medicare Other | Attending: Cardiology | Admitting: Cardiology

## 2023-03-28 ENCOUNTER — Encounter: Payer: Self-pay | Admitting: Cardiology

## 2023-03-28 VITALS — BP 152/84 | HR 73 | Ht 61.0 in | Wt 136.0 lb

## 2023-03-28 DIAGNOSIS — G4733 Obstructive sleep apnea (adult) (pediatric): Secondary | ICD-10-CM

## 2023-03-28 DIAGNOSIS — I10 Essential (primary) hypertension: Secondary | ICD-10-CM

## 2023-03-28 NOTE — Patient Instructions (Addendum)
Medication Instructions:  Your physician recommends that you continue on your current medications as directed. Please refer to the Current Medication list given to you today.  *If you need a refill on your cardiac medications before your next appointment, please call your pharmacy*  Lab Work: None ordered today. If you have labs (blood work) drawn today and your tests are completely normal, you will receive your results only by: MyChart Message (if you have MyChart) OR A paper copy in the mail If you have any lab test that is abnormal or we need to change your treatment, we will call you to review the results.  Testing/Procedures: Your physician has recommended that you have a sleep study. This test records several body functions during sleep, including: brain activity, eye movement, oxygen and carbon dioxide blood levels, heart rate and rhythm, breathing rate and rhythm, the flow of air through your mouth and nose, snoring, body muscle movements, and chest and belly movement.   Follow-Up: At [facility], you and your health needs are our priority.  As part of our continuing mission to provide you with exceptional heart care, we have created designated Provider Care Teams.  These Care Teams include your primary Cardiologist (physician) and Advanced Practice Providers (APPs -  Physician Assistants and Nurse Practitioners) who all work together to provide you with the care you need, when you need it.  We recommend signing up for the patient portal called "MyChart".  Sign up information is provided on this After Visit Summary.  MyChart is used to connect with patients for Virtual Visits (Telemedicine).  Patients are able to view lab/test results, encounter notes, upcoming appointments, etc.  Non-urgent messages can be sent to your provider as well.   To learn more about what you can do with MyChart, go to [email or web address].    Your next appointment:   Pending sleep study   Provider:    Armanda Magic, MD

## 2023-03-28 NOTE — Progress Notes (Signed)
Sleep Medicine CONSULT Note    Date:  03/28/2023   ID:  Lindsay Guerrero, DOB 1956/03/11, MRN [id number]  PCP:  Blane Ohara, MD  Cardiologist: Gypsy Balsam, MD   Chief Complaint  Patient presents with   New Patient (Initial Visit)    Obstructive sleep apnea    History of Present Illness:  Lindsay Guerrero is a 68 y.o. female who is being seen today for the evaluation of obstructive sleep apnea at the request of Gypsy Balsam, MD.  This is a 68 year old female with a history of ADHD, anxiety, aortic stenosis, asthma, atrial fibrillation, CHF, diabetes, hypertension and nonischemic cardiomyopathy.  Due to atrial fibrillation and excessive daytime sleepiness her primary doctor ordered a sleep study.  She had a sleep study years ago and she was borderline.  She was having problems sleeping.  She felt tired when she got up in the am and does nap some during the day.  This demonstrated mild obstructive sleep apnea with an AHI of 10.9/h and O2 saturations as low as 81%.  She is now furred for sleep medicine consultation to review sleep study findings and determine further treatment.  Past Medical History:  Diagnosis Date   Adult attention deficit disorder 05/15/2013   Anxiety state 11/19/2012   ICD10   Aortic stenosis 12/13/2017   Moderate by echocardiogram from 2019 mean gradient 25   Aortic stenosis, mild 07/13/2016   Moderate by echocardiogram from 2019 mean gradient 25   Aortic stenosis, severe 07/02/2019   Aortic valve sclerosis 05/24/2015   Formatting of this note might be different from the original. Without stenosis. Last Echo 2012   Asthma    mild   Atrial fibrillation (HCC) 06/28/2018   Atrial fibrillation with RVR (HCC) 06/28/2018   CHF (congestive heart failure) (HCC)    Chronic bilateral low back pain 01/27/2015   Depression    Diabetes mellitus without complication (HCC)    borderline   Dyspnea    on exertion   Dyspnea on exertion 11/01/2017   Elective  surgery for purposes other than treating health conditions 09/27/2010   ICD10   Essential hypertension 06/30/2018   Family history of colon cancer 03/01/2005   Family history of premature coronary artery disease 03/01/2005   GAD (generalized anxiety disorder) 03/01/2005   Formatting of this note might be different from the original. with panic attacks   Hepatitis    due to cytomegalovirus-resolved   History of breast cancer 06/30/2018   History of hiatal hernia    mild,found on a CT   History of malignant neoplasm of breast 03/01/2005   Formatting of this note might be different from the original. node neg; see chartnotes for treatment   HTN (hypertension), benign 07/13/2016   Hyperlipidemia 06/30/2018   Hypokalemia 06/30/2018   Inflamed seborrheic keratosis 08/08/2012   Irritable bowel syndrome 03/01/2005   Malignant neoplasm of upper-inner quadrant of female breast (HCC) 2003   Primary  diagnosis   Mild intermittent asthma without complication 03/01/2005   Formatting of this note might be different from the original. mild   Mitral regurgitation 12/13/2017   Mild to moderate based on echo from 2019   Mixed hyperlipidemia 07/20/2019   NICM (nonischemic cardiomyopathy) (HCC) 11/01/2015   Osteopenia 01/27/2015   Paroxysmal atrial fibrillation (HCC) 07/14/2019   Pneumonia 2018   Post menopausal problems 01/27/2015   Precordial pain 11/01/2015   Psoriatic arthritis (HCC) 04/25/2015   Recurrent genital HSV (herpes simplex virus) infection 03/01/2005  Formatting of this note might be different from the original. HSV genital   Restless legs syndrome 03/01/2005   Rosacea 03/01/2005   Routine history and physical examination of adult 09/22/2012   Formatting of this note might be different from the original. PROBLEM LIST:  1. Left node negative breast cancer 2002, stage I, grade 1, status post  lumpectomy and node dissection, external beam radiation,  chemotherapy/Cytoxan, mild left arm  lymphedema with cellulitis 2004.  2. Anxiety disorder with panic attacks, family history of same, globus  pharyngeus, palpitations, atypical chest pain, str   S/P aortic valve replacement with bioprosthetic valve 21 mm Edwards INSPIRIS RESILIA  07/02/2019   Size 21 mm   S/P AVR (aortic valve replacement) 07/20/2019   Seborrheic keratoses 09/10/2016   Sleep apnea    borderline   Vaginal dryness 01/27/2015    Past Surgical History:  Procedure Laterality Date   AORTIC VALVE REPLACEMENT N/A 07/02/2019   Procedure: AORTIC VALVE REPLACEMENT (AVR) USING INSPIRIS VALVE SIZE ;  Surgeon: Alleen Borne, MD;  Location: [facility] OR;  Service: Open Heart Surgery;  Laterality: N/A;   ATRIAL FIBRILLATION ABLATION N/A 07/05/2022   Procedure: ATRIAL FIBRILLATION ABLATION;  Surgeon: Regan Lemming, MD;  Location: [facility] INVASIVE CV LAB;  Service: Cardiovascular;  Laterality: N/A;   BREAST LUMPECTOMY Left 2003   sentinal node bx.   CARDIOVERSION N/A 07/10/2019   Procedure: CARDIOVERSION;  Surgeon: Little Ishikawa, MD;  Location: [facility] ENDOSCOPY;  Service: Cardiovascular;  Laterality: N/A;   CESAREAN SECTION     CLIPPING OF ATRIAL APPENDAGE N/A 07/02/2019   Procedure: CLIPPING OF ATRIAL APPENDAGE USING PRO2 CLIP SIZE ;  Surgeon: Alleen Borne, MD;  Location: [facility] OR;  Service: Open Heart Surgery;  Laterality: N/A;   COSMETIC SURGERY     IR THORACENTESIS ASP PLEURAL SPACE W/IMG GUIDE  07/07/2019   RIGHT/LEFT HEART CATH AND CORONARY ANGIOGRAPHY N/A 05/20/2019   Procedure: RIGHT/LEFT HEART CATH AND CORONARY ANGIOGRAPHY;  Surgeon: Tonny Bollman, MD;  Location: [facility] INVASIVE CV LAB;  Service: Cardiovascular;  Laterality: N/A;   TEE WITHOUT CARDIOVERSION N/A 07/02/2019   Procedure: TRANSESOPHAGEAL ECHOCARDIOGRAM (TEE);  Surgeon: Alleen Borne, MD;  Location: [facility] OR;  Service: Open Heart Surgery;  Laterality: N/A;   TONSILLECTOMY      Current Medications: Current Meds  Medication Sig   acebutolol  (SECTRAL) 200 MG capsule Take 1 capsule (200 mg total) by mouth daily.   acetaminophen (TYLENOL) 500 MG tablet Take 500-1,000 mg by mouth every 6 (six) hours as needed for moderate pain.   Acetylcysteine (NAC PO) Take 1 tablet by mouth 2 (two) times daily.   Acetylcysteine (NAC) 600 MG CAPS Take 600-1,200 mg by mouth daily.   amphetamine-dextroamphetamine (ADDERALL) 20 MG tablet Take 1 tablet (20 mg total) by mouth daily.   diltiazem (CARDIZEM) 30 MG tablet Take 1 tablet (30 mg total) by mouth every 8 (eight) hours as needed (palpations).   diphenhydrAMINE (BENADRYL) 25 MG tablet Take 25 mg by mouth daily as needed for itching.   fluticasone (FLONASE) 50 MCG/ACT nasal spray Place 2 sprays into both nostrils daily.   furosemide (LASIX) 20 MG tablet Take 20 mg by mouth daily.   LORazepam (ATIVAN) 1 MG tablet Take 1 tablet (1 mg total) by mouth every 8 (eight) hours as needed for anxiety or sleep.   Melatonin 10 MG TABS Take 3 mg by mouth at bedtime as needed (sleep).   Methylsulfonylmethane (MSM PO) Take 2 tablets by mouth  2 (two) times daily.   omeprazole (PRILOSEC OTC) 20 MG tablet Take 20 mg by mouth daily as needed (acid reflux).   ondansetron (ZOFRAN) 4 MG tablet Take 4 mg by mouth every 8 (eight) hours as needed for nausea or vomiting.   sodium chloride (OCEAN) 0.65 % SOLN nasal spray Place 1 spray into both nostrils 2 (two) times daily.   triamcinolone cream (KENALOG) 0.1 % Apply 1 Application topically 2 (two) times daily as needed (psoriasis).   Vitamin D, Ergocalciferol, (DRISDOL) 1.25 MG (50000 UNIT) CAPS capsule Take 1 capsule (50,000 Units total) by mouth every Monday.    Allergies:   Lisinopril and Metoprolol   Social History   Socioeconomic History   Marital status: Married    Spouse name: Not on file   Number of children: 5   Years of education: Not on file   Highest education level: Associate degree: academic program  Occupational History   Occupation: retired  Tobacco  Use   Smoking status: Never   Smokeless tobacco: Never  Vaping Use   Vaping status: Never Used  Substance and Sexual Activity   Alcohol use: Yes    Comment: occasional social drinking (once per month)    Drug use: Yes    Types: Marijuana    Comment: smoked college years, chew THC gummies   Sexual activity: Yes    Partners: Male    Birth control/protection: None  Other Topics Concern   Not on file  Social History Narrative   Not on file   Social Drivers of Health   Financial Resource Strain: Low Risk  (07/30/2022)   Overall Financial Resource Strain (CARDIA)    Difficulty of Paying Living Expenses: Not very hard  Food Insecurity: No Food Insecurity (07/30/2022)   Hunger Vital Sign    Worried About Running Out of Food in the Last Year: Never true    Ran Out of Food in the Last Year: Never true  Transportation Needs: No Transportation Needs (07/30/2022)   PRAPARE - Administrator, Civil Service (Medical): No    Lack of Transportation (Non-Medical): No  Physical Activity: Sufficiently Active (07/30/2022)   Exercise Vital Sign    Days of Exercise per Week: 5 days    Minutes of Exercise per Session: 30 min  Stress: Stress Concern Present (07/30/2022)   [employer] of Occupational Health - Occupational Stress Questionnaire    Feeling of Stress : To some extent  Social Connections: Socially Integrated (07/30/2022)   Social Connection and Isolation Panel [NHANES]    Frequency of Communication with Friends and Family: More than three times a week    Frequency of Social Gatherings with Friends and Family: More than three times a week    Attends Religious Services: More than 4 times per year    Active Member of [employer] or Organizations: Yes    Attends Engineer, structural: More than 4 times per year    Marital Status: Married     Family History:  The patient's family history includes AAA (abdominal aortic aneurysm) in her paternal grandfather; Blindness in her  daughter; Colon cancer in her maternal grandmother; Developmental delay in her daughter; Diabetes in her daughter; Heart Problems in her father and mother; Heart attack in her sister; Heart disease in her father, maternal grandmother, mother, and sister; Hypertension in her mother.   ROS:   Please see the history of present illness.    ROS All other systems reviewed and are negative.  No data to display             PHYSICAL EXAM:   VS:  BP (!) 152/84   Pulse 73   Ht 5\' 1"  (1.549 m)   Wt 136 lb (61.7 kg)   LMP  (LMP Unknown)   SpO2 97%   BMI 25.70 kg/m    GEN: Well nourished, well developed, in no acute distress  HEENT: normal  Neck: no JVD, carotid bruits, or masses Cardiac: RRR; no murmurs, rubs, or gallops,no edema.  Intact distal pulses bilaterally.  Respiratory:  clear to auscultation bilaterally, normal work of breathing GI: soft, nontender, nondistended, + BS MS: no deformity or atrophy  Skin: warm and dry, no rash Neuro:  Alert and Oriented x 3, Strength and sensation are intact Psych: euthymic mood, full affect  Wt Readings from Last 3 Encounters:  03/28/23 136 lb (61.7 kg)  01/03/23 132 lb 9.6 oz (60.1 kg)  11/26/22 135 lb (61.2 kg)      Studies/Labs Reviewed:   Home sleep study  Recent Labs: 07/31/2022: Magnesium 2.4 11/26/2022: ALT 13; BUN 17; Creatinine, Ser 0.83; Hemoglobin 13.3; Platelets 280; Potassium 4.7; Sodium 137; TSH 2.030    CHA2DS2-VASc Score = 4   This indicates a 4.8% annual risk of stroke. The patient's score is based upon: CHF History: 1 HTN History: 0 Diabetes History: 0 Stroke History: 0 Vascular Disease History: 1 Age Score: 1 Gender Score: 1      ASSESSMENT:    1. OSA (obstructive sleep apnea)   2. Essential hypertension      PLAN:  In order of problems listed above:  OSA  -She had a sleep study done for excessive daytime sleepiness and atrial fibrillation -Home sleep study showed mild obstructive sleep  apnea with an AHI of 10/h -Stop Bang score 4 -she says that she will not be compliant with a CPAp device and wants the Inspire but unfortunately she does not have enough OSA at this time to be approved for the Inspire -we talked about going to the sleep lab to see if we can pick up further apneas that may be high enough to qualify for the Inspire  Hypertension -BP controlled on exam today -Continue drug management with a spewed along 200 mg daily  Time Spent: 20 minutes total time of encounter, including 15 minutes spent in face-to-face patient care on the date of this encounter. This time includes coordination of care and counseling regarding above mentioned problem list. Remainder of non-face-to-face time involved reviewing chart documents/testing relevant to the patient encounter and documentation in the medical record. I have independently reviewed documentation from referring provider  Medication Adjustments/Labs and Tests Ordered: Current medicines are reviewed at length with the patient today.  Concerns regarding medicines are outlined above.  Medication changes, Labs and Tests ordered today are listed in the Patient Instructions below.  There are no Patient Instructions on file for this visit.   Signed, Armanda Magic, MD  03/28/2023 9:00 AM    [facility] [address] [place], [place], [place]  [address] Phone: [phone]; Fax: [phone]

## 2023-03-28 NOTE — Addendum Note (Signed)
Addended by: Neita Goodnight B on: 03/28/2023 09:11 AM   Modules accepted: Orders

## 2023-04-02 ENCOUNTER — Encounter: Payer: Self-pay | Admitting: Family Medicine

## 2023-04-03 ENCOUNTER — Ambulatory Visit: Payer: Medicare Other | Admitting: Family Medicine

## 2023-04-05 DIAGNOSIS — H524 Presbyopia: Secondary | ICD-10-CM | POA: Diagnosis not present

## 2023-04-05 DIAGNOSIS — H26063 Combined forms of infantile and juvenile cataract, bilateral: Secondary | ICD-10-CM | POA: Diagnosis not present

## 2023-04-05 LAB — HM DIABETES EYE EXAM

## 2023-04-11 ENCOUNTER — Other Ambulatory Visit: Payer: Self-pay | Admitting: Family Medicine

## 2023-04-11 MED ORDER — AMPHETAMINE-DEXTROAMPHETAMINE 20 MG PO TABS
20.0000 mg | ORAL_TABLET | Freq: Every day | ORAL | 0 refills | Status: DC
  Filled 2023-04-11: qty 30, 30d supply, fill #0

## 2023-04-11 MED ORDER — ONDANSETRON HCL 4 MG PO TABS
4.0000 mg | ORAL_TABLET | Freq: Three times a day (TID) | ORAL | 0 refills | Status: DC | PRN
  Filled 2023-04-11: qty 20, 7d supply, fill #0

## 2023-04-12 ENCOUNTER — Other Ambulatory Visit (HOSPITAL_BASED_OUTPATIENT_CLINIC_OR_DEPARTMENT_OTHER): Payer: Self-pay

## 2023-04-17 NOTE — Progress Notes (Signed)
 Subjective:  Patient ID: Lindsay Guerrero, female    DOB: Nov 10, 1955  Age: 68 y.o. MRN: [id number]  Chief Complaint  Patient presents with   Medical Management of Chronic Issues    HPI Afib: She is taking acebutolol  200 mg daily, Diltiazem  30 mg four times a day as needed. She sees Dr MARLA due to afib.    ADHD: She has been on Adderall 20 mg daily.   Hypertension: Takes acebutolol  200 mg daily, furosemide  20 mg daily.   Hyperlipidemia: previously on pravastatin  in 2022.   Patient has a history of aortic valve regurgitation which required a aortic valve replacement with a bioprosthetic valve (21 mm Edwards Inspira's Resilia.)  Patient requires amoxicillin  prophylaxis for any dental procedures.   GAD: Currently on lorazepam  1 mg 1 p.o. 3 times daily as needed.  Usually takes once daily. Previously, took wellbutrin , zoloft, celexa. They helped. Intolerant to prozac.    Vitamin D  deficiency: Currently on vitamin D  50 K weekly. Gets a large amount of calcium in diet.    Diabetes: Last A1C 6.6. Patient does not check her sugars.  Patient does try to eat healthy. Walks daily 1/2 - 2 miles.      04/18/2023    7:58 AM 11/26/2022   10:40 AM 07/24/2022   10:57 AM  Depression screen PHQ 2/9  Decreased Interest 0 1 0  Down, Depressed, Hopeless 1 0 1  PHQ - 2 Score 1 1 1   Altered sleeping 2 2 3   Tired, decreased energy 0 1 3  Change in appetite 0 0 0  Feeling bad or failure about yourself  0 0 0  Trouble concentrating 0 0 0  Moving slowly or fidgety/restless 0 0 0  Suicidal thoughts 0 0 0  PHQ-9 Score 3 4 7   Difficult doing work/chores Not difficult at all Not difficult at all Somewhat difficult        04/18/2023    7:58 AM  Fall Risk   Falls in the past year? 1  Number falls in past yr: 0  Injury with Fall? 0  Risk for fall due to : No Fall Risks    Patient Care Team: Sherre Clapper, MD as PCP - General (Family Medicine) Bernie Lamar PARAS, MD as PCP - Cardiology  (Cardiology) Inocencio Soyla Lunger, MD as PCP - Electrophysiology (Cardiology)   Review of Systems  Constitutional:  Negative for chills, fatigue and fever.  HENT:  Negative for congestion, ear pain and sore throat.   Respiratory:  Negative for cough and shortness of breath.   Cardiovascular:  Negative for chest pain.  Gastrointestinal:  Negative for abdominal pain, constipation, diarrhea, nausea and vomiting.  Genitourinary:  Negative for dysuria and urgency.  Musculoskeletal:  Negative for arthralgias and myalgias.  Skin:  Negative for rash.  Neurological:  Negative for dizziness and headaches.  Psychiatric/Behavioral:  Negative for dysphoric mood. The patient is not nervous/anxious.     Current Outpatient Medications on File Prior to Visit  Medication Sig Dispense Refill   acebutolol  (SECTRAL ) 200 MG capsule Take 1 capsule (200 mg total) by mouth daily. 90 capsule 3   acetaminophen  (TYLENOL ) 500 MG tablet Take 500-1,000 mg by mouth every 6 (six) hours as needed for moderate pain.     Acetylcysteine  (NAC PO) Take 1 tablet by mouth 2 (two) times daily.     Acetylcysteine  (NAC) 600 MG CAPS Take 600-1,200 mg by mouth daily.     amphetamine -dextroamphetamine  (ADDERALL) 20 MG tablet Take 1  tablet (20 mg total) by mouth daily. 30 tablet 0   diltiazem  (CARDIZEM ) 30 MG tablet Take 1 tablet (30 mg total) by mouth every 8 (eight) hours as needed (palpations). 60 tablet 1   diphenhydrAMINE  (BENADRYL ) 25 MG tablet Take 25 mg by mouth daily as needed for itching.     fluticasone  (FLONASE ) 50 MCG/ACT nasal spray Place 2 sprays into both nostrils daily. 16 g 6   furosemide  (LASIX ) 20 MG tablet Take 20 mg by mouth daily.     LORazepam  (ATIVAN ) 1 MG tablet Take 1 tablet (1 mg total) by mouth every 8 (eight) hours as needed for anxiety or sleep. 90 tablet 0   Melatonin 10 MG TABS Take 3 mg by mouth at bedtime as needed (sleep).     Methylsulfonylmethane (MSM PO) Take 2 tablets by mouth 2 (two) times  daily.     omeprazole (PRILOSEC OTC) 20 MG tablet Take 20 mg by mouth daily as needed (acid reflux).     ondansetron  (ZOFRAN ) 4 MG tablet Take 1 tablet (4 mg total) by mouth every 8 (eight) hours as needed for nausea or vomiting. 20 tablet 0   sodium chloride  (OCEAN) 0.65 % SOLN nasal spray Place 1 spray into both nostrils 2 (two) times daily.     triamcinolone cream (KENALOG) 0.1 % Apply 1 Application topically 2 (two) times daily as needed (psoriasis).     Vitamin D , Ergocalciferol , (DRISDOL ) 1.25 MG (50000 UNIT) CAPS capsule Take 1 capsule (50,000 Units total) by mouth every Monday. 5 capsule 2   No current facility-administered medications on file prior to visit.   Past Medical History:  Diagnosis Date   Adult attention deficit disorder 05/15/2013   Anxiety state 11/19/2012   ICD10   Aortic stenosis 12/13/2017   Moderate by echocardiogram from 2019 mean gradient 25   Aortic stenosis, mild 07/13/2016   Moderate by echocardiogram from 2019 mean gradient 25   Aortic stenosis, severe 07/02/2019   Asthma    mild   CHF (congestive heart failure) (HCC)    Chronic bilateral low back pain 01/27/2015   Depression    Diabetes mellitus without complication (HCC)    borderline   Dyspnea    on exertion   Dyspnea on exertion 11/01/2017   Elective surgery for purposes other than treating health conditions 09/27/2010   ICD10   Essential hypertension 06/30/2018   Family history of colon cancer 03/01/2005   Family history of premature coronary artery disease 03/01/2005   GAD (generalized anxiety disorder) 03/01/2005   Formatting of this note might be different from the original. with panic attacks   Hepatitis    due to cytomegalovirus-resolved   History of breast cancer 06/30/2018   History of hiatal hernia    mild,found on a CT   History of malignant neoplasm of breast 03/01/2005   Formatting of this note might be different from the original. node neg; see chartnotes for treatment   HTN  (hypertension), benign 07/13/2016   Hyperlipidemia 06/30/2018   Hypokalemia 06/30/2018   Inflamed seborrheic keratosis 08/08/2012   Irritable bowel syndrome 03/01/2005   Malignant neoplasm of upper-inner quadrant of female breast (HCC) 2003   Primary  diagnosis   Mild intermittent asthma without complication 03/01/2005   Formatting of this note might be different from the original. mild   Mitral regurgitation 12/13/2017   Mild to moderate based on echo from 2019   Mixed hyperlipidemia 07/20/2019   NICM (nonischemic cardiomyopathy) (HCC) 11/01/2015   OSA (obstructive  sleep apnea)    mild obstructive sleep apnea with an AHI of 10.9/h and O2 saturations as low as 81%.   Osteopenia 01/27/2015   Paroxysmal atrial fibrillation (HCC) 07/14/2019   Pneumonia 2018   Post menopausal problems 01/27/2015   Precordial pain 11/01/2015   Psoriatic arthritis (HCC) 04/25/2015   Recurrent genital HSV (herpes simplex virus) infection 03/01/2005   Formatting of this note might be different from the original. HSV genital   Restless legs syndrome 03/01/2005   Rosacea 03/01/2005   Routine history and physical examination of adult 09/22/2012   Formatting of this note might be different from the original. PROBLEM LIST:  1. Left node negative breast cancer 2002, stage I, grade 1, status post  lumpectomy and node dissection, external beam radiation,  chemotherapy/Cytoxan, mild left arm lymphedema with cellulitis 2004.  2. Anxiety disorder with panic attacks, family history of same, globus  pharyngeus, palpitations, atypical chest pain, str   S/P aortic valve replacement with bioprosthetic valve 21 mm Edwards INSPIRIS RESILIA  07/02/2019   Size 21 mm   Seborrheic keratoses 09/10/2016   Vaginal dryness 01/27/2015   Past Surgical History:  Procedure Laterality Date   AORTIC VALVE REPLACEMENT N/A 07/02/2019   Procedure: AORTIC VALVE REPLACEMENT (AVR) USING INSPIRIS VALVE SIZE ;  Surgeon: Lucas Dorise POUR, MD;   Location: [facility] OR;  Service: Open Heart Surgery;  Laterality: N/A;   ATRIAL FIBRILLATION ABLATION N/A 07/05/2022   Procedure: ATRIAL FIBRILLATION ABLATION;  Surgeon: Inocencio Soyla Lunger, MD;  Location: [facility] INVASIVE CV LAB;  Service: Cardiovascular;  Laterality: N/A;   BREAST LUMPECTOMY Left 2003   sentinal node bx.   CARDIOVERSION N/A 07/10/2019   Procedure: CARDIOVERSION;  Surgeon: Kate Lonni CROME, MD;  Location: [facility] ENDOSCOPY;  Service: Cardiovascular;  Laterality: N/A;   CESAREAN SECTION     CLIPPING OF ATRIAL APPENDAGE N/A 07/02/2019   Procedure: CLIPPING OF ATRIAL APPENDAGE USING PRO2 CLIP SIZE ;  Surgeon: Lucas Dorise POUR, MD;  Location: [facility] OR;  Service: Open Heart Surgery;  Laterality: N/A;   COSMETIC SURGERY     IR THORACENTESIS ASP PLEURAL SPACE W/IMG GUIDE  07/07/2019   RIGHT/LEFT HEART CATH AND CORONARY ANGIOGRAPHY N/A 05/20/2019   Procedure: RIGHT/LEFT HEART CATH AND CORONARY ANGIOGRAPHY;  Surgeon: Wonda Sharper, MD;  Location: [facility] INVASIVE CV LAB;  Service: Cardiovascular;  Laterality: N/A;   TEE WITHOUT CARDIOVERSION N/A 07/02/2019   Procedure: TRANSESOPHAGEAL ECHOCARDIOGRAM (TEE);  Surgeon: Lucas Dorise POUR, MD;  Location: [facility] OR;  Service: Open Heart Surgery;  Laterality: N/A;   TONSILLECTOMY      Family History  Problem Relation Age of Onset   Heart Problems Mother    Hypertension Mother    Heart disease Mother    Heart Problems Father    Heart disease Father    Heart disease Sister    Heart attack Sister    Blindness Daughter    Diabetes Daughter    Developmental delay Daughter    Colon cancer Maternal Grandmother    Heart disease Maternal Grandmother    AAA (abdominal aortic aneurysm) Paternal Grandfather    Social History   Socioeconomic History   Marital status: Married    Spouse name: Not on file   Number of children: 5   Years of education: Not on file   Highest education level: Associate degree: academic program  Occupational History   Occupation:  retired  Tobacco Use   Smoking status: Never   Smokeless tobacco: Never  Advertising Account Planner  Vaping status: Never Used  Substance and Sexual Activity   Alcohol use: Yes    Comment: occasional social drinking (once per month)    Drug use: Yes    Types: Marijuana    Comment: smoked college years, chew THC gummies   Sexual activity: Yes    Partners: Male    Birth control/protection: None  Other Topics Concern   Not on file  Social History Narrative   Not on file   Social Drivers of Health   Financial Resource Strain: Low Risk  (04/02/2023)   Overall Financial Resource Strain (CARDIA)    Difficulty of Paying Living Expenses: Not hard at all  Food Insecurity: No Food Insecurity (04/02/2023)   Hunger Vital Sign    Worried About Running Out of Food in the Last Year: Never true    Ran Out of Food in the Last Year: Never true  Transportation Needs: No Transportation Needs (04/02/2023)   PRAPARE - Administrator, Civil Service (Medical): No    Lack of Transportation (Non-Medical): No  Physical Activity: Sufficiently Active (04/02/2023)   Exercise Vital Sign    Days of Exercise per Week: 6 days    Minutes of Exercise per Session: 40 min  Stress: Stress Concern Present (04/02/2023)   [employer] of Occupational Health - Occupational Stress Questionnaire    Feeling of Stress : Rather much  Social Connections: Socially Integrated (04/02/2023)   Social Connection and Isolation Panel [NHANES]    Frequency of Communication with Friends and Family: More than three times a week    Frequency of Social Gatherings with Friends and Family: Three times a week    Attends Religious Services: 1 to 4 times per year    Active Member of Clubs or Organizations: Yes    Attends Banker Meetings: More than 4 times per year    Marital Status: Married    Objective:  BP 110/80   Pulse 68   Temp 97.6 F (36.4 C)   Ht 5' 1 (1.549 m)   Wt 134 lb 6.4 oz (61 kg)   LMP  (LMP Unknown)    SpO2 98%   BMI 25.39 kg/m      04/18/2023    7:56 AM 03/28/2023    8:48 AM 01/03/2023    9:51 AM  BP/Weight  Systolic BP 110 152 110  Diastolic BP 80 84 72  Wt. (Lbs) 134.4 136 132.6  BMI 25.39 kg/m2 25.7 kg/m2 25.05 kg/m2    Physical Exam Vitals reviewed.  Constitutional:      Appearance: Normal appearance. She is normal weight.  Neck:     Vascular: No carotid bruit.  Cardiovascular:     Rate and Rhythm: Normal rate and regular rhythm.     Heart sounds: Normal heart sounds.  Pulmonary:     Effort: Pulmonary effort is normal. No respiratory distress.     Breath sounds: Normal breath sounds.  Abdominal:     General: Abdomen is flat. Bowel sounds are normal.     Palpations: Abdomen is soft.     Tenderness: There is no abdominal tenderness.  Neurological:     Mental Status: She is alert and oriented to person, place, and time.  Psychiatric:        Mood and Affect: Mood normal.        Behavior: Behavior normal.     Diabetic Foot Exam - Simple   No data filed      Lab Results  Component  Value Date   WBC 5.4 04/18/2023   HGB 13.8 04/18/2023   HCT 42.0 04/18/2023   PLT 301 04/18/2023   GLUCOSE 147 (H) 04/18/2023   CHOL 250 (H) 04/18/2023   TRIG 128 04/18/2023   HDL 55 04/18/2023   LDLDIRECT 166 (H) 09/15/2020   LDLCALC 172 (H) 04/18/2023   ALT 15 04/18/2023   AST 13 04/18/2023   NA 140 04/18/2023   K 4.4 04/18/2023   CL 98 04/18/2023   CREATININE 0.76 04/18/2023   BUN 18 04/18/2023   CO2 26 04/18/2023   TSH 2.030 11/26/2022   INR 1.6 (H) 07/02/2019   HGBA1C 6.6 (H) 04/18/2023      Assessment & Plan:    Essential hypertension, benign Assessment & Plan: Hypertension is well-controlled.  Medicines: Takes acebutolol  200 mg daily, furosemide  20 mg daily.  Continue to work on eating a healthy diet and exercise.  Labs drawn today  Orders: -     CBC with Differential/Platelet -     Comprehensive metabolic panel -     Hemoglobin A1c -      Microalbumin / creatinine urine ratio  Mixed diabetic hyperlipidemia associated with type 2 diabetes mellitus (HCC) Assessment & Plan: Control: diabetes good. Cholesterol poorly controlled.  Recommend check feet daily. Recommend annual eye exams. Medicines: Recommend crestor 10 mg before bed. If patient refuses, recommend zetia 10 mg daily.  Continue to work on eating a healthy diet and exercise.  Labs drawn today.      Paroxysmal atrial fibrillation (HCC) Assessment & Plan: Management per specialist. Appears to be in normal sinus rhythm on exam.   Continue Eliquis , diltiazem .  Has diltiazem  immediate release 30 mg daily four times a day as needed. Also is on Acebutolol  200 mg daily.   Mixed hyperlipidemia Assessment & Plan: Await labs/testing for recommendations.  Recommend continue to work on eating healthy diet and exercise.   Orders: -     Lipid panel  OSA (obstructive sleep apnea) Assessment & Plan: Stable.   GAD (generalized anxiety disorder) Assessment & Plan: Recommended counseling.  Counseling or [facility].   Recommend start trintellix  5 mg once daily. If helping, please call me or my chart message me before you leave for your trip and I can increase or send a new prescription to your pharmacy.   Orders: -     Vortioxetine  HBr; Take 1 tablet (5 mg total) by mouth daily.     Meds ordered this encounter  Medications   vortioxetine  HBr (TRINTELLIX ) 5 MG TABS tablet    Sig: Take 1 tablet (5 mg total) by mouth daily.    Orders Placed This Encounter  Procedures   CBC with Differential/Platelet   Comprehensive metabolic panel   Hemoglobin A1c   Lipid panel   Microalbumin / creatinine urine ratio     Follow-up: Return in about 3 months (around 07/16/2023) for chronic follow up.   I,Marla I Leal-Borjas,acting as a scribe for Abigail Free, MD.,have documented all relevant documentation on the behalf of Abigail Free, MD,as directed by  Abigail Free, MD while in the presence of Abigail Free, MD.   An After Visit Summary was printed and given to the patient.  I attest that I have reviewed this visit and agree with the plan scribed by my staff.   Abigail Free, MD [facility] [phone]

## 2023-04-18 ENCOUNTER — Ambulatory Visit (INDEPENDENT_AMBULATORY_CARE_PROVIDER_SITE_OTHER): Payer: Medicare Other | Admitting: Family Medicine

## 2023-04-18 ENCOUNTER — Encounter: Payer: Self-pay | Admitting: Family Medicine

## 2023-04-18 VITALS — BP 110/80 | HR 68 | Temp 97.6°F | Ht 61.0 in | Wt 134.4 lb

## 2023-04-18 DIAGNOSIS — I152 Hypertension secondary to endocrine disorders: Secondary | ICD-10-CM | POA: Diagnosis not present

## 2023-04-18 DIAGNOSIS — I10 Essential (primary) hypertension: Secondary | ICD-10-CM | POA: Diagnosis not present

## 2023-04-18 DIAGNOSIS — E1169 Type 2 diabetes mellitus with other specified complication: Secondary | ICD-10-CM

## 2023-04-18 DIAGNOSIS — E1159 Type 2 diabetes mellitus with other circulatory complications: Secondary | ICD-10-CM | POA: Diagnosis not present

## 2023-04-18 DIAGNOSIS — G4733 Obstructive sleep apnea (adult) (pediatric): Secondary | ICD-10-CM

## 2023-04-18 DIAGNOSIS — I480 Paroxysmal atrial fibrillation: Secondary | ICD-10-CM

## 2023-04-18 DIAGNOSIS — E782 Mixed hyperlipidemia: Secondary | ICD-10-CM | POA: Diagnosis not present

## 2023-04-18 DIAGNOSIS — F411 Generalized anxiety disorder: Secondary | ICD-10-CM

## 2023-04-18 MED ORDER — VORTIOXETINE HBR 5 MG PO TABS: 5.0000 mg | ORAL_TABLET | Freq: Every day | ORAL | Status: DC

## 2023-04-18 NOTE — Patient Instructions (Addendum)
 Recommended counseling. [facility] or [facility].   Recommend start trintellix  5 mg once daily. If helping, please call me or my chart message me before you leave for your trip and I can increase or send a new prescription to your pharmacy

## 2023-04-19 LAB — MICROALBUMIN / CREATININE URINE RATIO
Creatinine, Urine: 118.5 mg/dL
Microalb/Creat Ratio: 7 mg/g{creat} (ref 0–29)
Microalbumin, Urine: 8.3 ug/mL

## 2023-04-19 LAB — COMPREHENSIVE METABOLIC PANEL
ALT: 15 [IU]/L (ref 0–32)
AST: 13 [IU]/L (ref 0–40)
Albumin: 4.4 g/dL (ref 3.9–4.9)
Alkaline Phosphatase: 88 [IU]/L (ref 44–121)
BUN/Creatinine Ratio: 24 (ref 12–28)
BUN: 18 mg/dL (ref 8–27)
Bilirubin Total: 0.3 mg/dL (ref 0.0–1.2)
CO2: 26 mmol/L (ref 20–29)
Calcium: 9.6 mg/dL (ref 8.7–10.3)
Chloride: 98 mmol/L (ref 96–106)
Creatinine, Ser: 0.76 mg/dL (ref 0.57–1.00)
Globulin, Total: 2.9 g/dL (ref 1.5–4.5)
Glucose: 147 mg/dL — ABNORMAL HIGH (ref 70–99)
Potassium: 4.4 mmol/L (ref 3.5–5.2)
Sodium: 140 mmol/L (ref 134–144)
Total Protein: 7.3 g/dL (ref 6.0–8.5)
eGFR: 85 mL/min/{1.73_m2} (ref >59)

## 2023-04-19 LAB — CBC WITH DIFFERENTIAL/PLATELET
Basophils Absolute: 0 10*3/uL (ref 0.0–0.2)
Basos: 1 %
EOS (ABSOLUTE): 0.1 10*3/uL (ref 0.0–0.4)
Eos: 1 %
Hematocrit: 42 % (ref 34.0–46.6)
Hemoglobin: 13.8 g/dL (ref 11.1–15.9)
Immature Grans (Abs): 0 10*3/uL (ref 0.0–0.1)
Immature Granulocytes: 0 %
Lymphocytes Absolute: 1.5 10*3/uL (ref 0.7–3.1)
Lymphs: 29 %
MCH: 29.5 pg (ref 26.6–33.0)
MCHC: 32.9 g/dL (ref 31.5–35.7)
MCV: 90 fL (ref 79–97)
Monocytes Absolute: 0.5 10*3/uL (ref 0.1–0.9)
Monocytes: 10 %
Neutrophils Absolute: 3.2 10*3/uL (ref 1.4–7.0)
Neutrophils: 59 %
Platelets: 301 10*3/uL (ref 150–450)
RBC: 4.68 x10E6/uL (ref 3.77–5.28)
RDW: 12.9 % (ref 11.7–15.4)
WBC: 5.4 10*3/uL (ref 3.4–10.8)

## 2023-04-19 LAB — LIPID PANEL
Chol/HDL Ratio: 4.5 {ratio} — ABNORMAL HIGH (ref 0.0–4.4)
Cholesterol, Total: 250 mg/dL — ABNORMAL HIGH (ref 100–199)
HDL: 55 mg/dL (ref >39)
LDL Chol Calc (NIH): 172 mg/dL — ABNORMAL HIGH (ref 0–99)
Triglycerides: 128 mg/dL (ref 0–149)
VLDL Cholesterol Cal: 23 mg/dL (ref 5–40)

## 2023-04-19 LAB — HEMOGLOBIN A1C
Est. average glucose Bld gHb Est-mCnc: 143 mg/dL
Hgb A1c MFr Bld: 6.6 % — ABNORMAL HIGH (ref 4.8–5.6)

## 2023-04-20 NOTE — Assessment & Plan Note (Addendum)
 Recommended counseling. [facility] or [facility].   Recommend start trintellix  5 mg once daily. If helping, please call me or my chart message me before you leave for your trip and I can increase or send a new prescription to your pharmacy

## 2023-04-20 NOTE — Assessment & Plan Note (Signed)
 Management per specialist. Appears to be in normal sinus rhythm on exam.   Continue Eliquis , diltiazem .  Has diltiazem  immediate release 30 mg daily four times a day as needed. Also is on Acebutolol  200 mg daily.

## 2023-04-20 NOTE — Assessment & Plan Note (Addendum)
 Hypertension is well-controlled.  Medicines: Takes acebutolol  200 mg daily, furosemide  20 mg daily.  Continue to work on eating a healthy diet and exercise.  Labs drawn today

## 2023-04-20 NOTE — Assessment & Plan Note (Signed)
 Control: diabetes good. Cholesterol poorly controlled.  Recommend check feet daily. Recommend annual eye exams. Medicines: Recommend crestor 10 mg before bed. If patient refuses, recommend zetia 10 mg daily.  Continue to work on eating a healthy diet and exercise.  Labs drawn today.

## 2023-04-20 NOTE — Assessment & Plan Note (Signed)
 Stable

## 2023-04-20 NOTE — Assessment & Plan Note (Addendum)
 Await labs/testing for recommendations.  Recommend continue to work on eating healthy diet and exercise. Recommend crestor.

## 2023-04-21 ENCOUNTER — Encounter: Payer: Self-pay | Admitting: Family Medicine

## 2023-04-24 ENCOUNTER — Other Ambulatory Visit: Payer: Self-pay | Admitting: Family Medicine

## 2023-04-24 ENCOUNTER — Encounter: Payer: Self-pay | Admitting: Family Medicine

## 2023-04-24 DIAGNOSIS — F411 Generalized anxiety disorder: Secondary | ICD-10-CM

## 2023-04-25 ENCOUNTER — Other Ambulatory Visit (HOSPITAL_BASED_OUTPATIENT_CLINIC_OR_DEPARTMENT_OTHER): Payer: Self-pay

## 2023-04-25 MED ORDER — FUROSEMIDE 20 MG PO TABS
20.0000 mg | ORAL_TABLET | Freq: Every day | ORAL | 1 refills | Status: DC
  Filled 2023-04-25 – 2023-06-25 (×2): qty 90, 90d supply, fill #0
  Filled 2023-09-18: qty 90, 90d supply, fill #1

## 2023-04-25 MED ORDER — LORAZEPAM 1 MG PO TABS
1.0000 mg | ORAL_TABLET | Freq: Three times a day (TID) | ORAL | 2 refills | Status: DC | PRN
  Filled 2023-04-25: qty 90, 30d supply, fill #0
  Filled 2023-07-24: qty 90, 30d supply, fill #1
  Filled 2023-09-25: qty 90, 30d supply, fill #2

## 2023-04-25 MED ORDER — VORTIOXETINE HBR 5 MG PO TABS: 5.0000 mg | ORAL_TABLET | Freq: Every day | ORAL | 0 refills | Status: DC

## 2023-04-26 ENCOUNTER — Other Ambulatory Visit (HOSPITAL_BASED_OUTPATIENT_CLINIC_OR_DEPARTMENT_OTHER): Payer: Self-pay

## 2023-04-26 ENCOUNTER — Telehealth: Payer: Self-pay

## 2023-04-26 ENCOUNTER — Other Ambulatory Visit: Payer: Self-pay | Admitting: Family Medicine

## 2023-04-26 DIAGNOSIS — F411 Generalized anxiety disorder: Secondary | ICD-10-CM

## 2023-04-26 NOTE — Telephone Encounter (Signed)
Left VM, per DPR. Notified patient that Auth for in lab sleep study is complete and to expect a call from the [facility] to get patient scheduled.

## 2023-04-29 ENCOUNTER — Other Ambulatory Visit (HOSPITAL_BASED_OUTPATIENT_CLINIC_OR_DEPARTMENT_OTHER): Payer: Self-pay

## 2023-04-30 ENCOUNTER — Other Ambulatory Visit (HOSPITAL_BASED_OUTPATIENT_CLINIC_OR_DEPARTMENT_OTHER): Payer: Self-pay

## 2023-05-01 ENCOUNTER — Ambulatory Visit: Payer: Medicare Other | Admitting: Cardiology

## 2023-05-02 ENCOUNTER — Other Ambulatory Visit (HOSPITAL_BASED_OUTPATIENT_CLINIC_OR_DEPARTMENT_OTHER): Payer: Self-pay

## 2023-05-02 ENCOUNTER — Other Ambulatory Visit: Payer: Self-pay | Admitting: Family Medicine

## 2023-05-02 DIAGNOSIS — F411 Generalized anxiety disorder: Secondary | ICD-10-CM

## 2023-05-02 MED ORDER — VORTIOXETINE HBR 5 MG PO TABS
5.0000 mg | ORAL_TABLET | Freq: Every day | ORAL | 0 refills | Status: DC
  Filled 2023-05-02: qty 90, 90d supply, fill #0

## 2023-05-03 ENCOUNTER — Other Ambulatory Visit (HOSPITAL_BASED_OUTPATIENT_CLINIC_OR_DEPARTMENT_OTHER): Payer: Self-pay

## 2023-06-17 ENCOUNTER — Ambulatory Visit: Admitting: Cardiology

## 2023-06-24 ENCOUNTER — Telehealth: Payer: Self-pay | Admitting: Family Medicine

## 2023-06-24 NOTE — Telephone Encounter (Signed)
 06/24/23 patient declined schedule AWV - does not feel it is necessary to do anymore

## 2023-06-25 ENCOUNTER — Encounter: Payer: Self-pay | Admitting: Cardiology

## 2023-06-26 ENCOUNTER — Other Ambulatory Visit (HOSPITAL_BASED_OUTPATIENT_CLINIC_OR_DEPARTMENT_OTHER): Payer: Self-pay

## 2023-07-02 ENCOUNTER — Ambulatory Visit (HOSPITAL_BASED_OUTPATIENT_CLINIC_OR_DEPARTMENT_OTHER): Payer: Medicare Other | Attending: Cardiology | Admitting: Cardiology

## 2023-07-02 DIAGNOSIS — R0683 Snoring: Secondary | ICD-10-CM | POA: Insufficient documentation

## 2023-07-02 DIAGNOSIS — G4736 Sleep related hypoventilation in conditions classified elsewhere: Secondary | ICD-10-CM | POA: Diagnosis not present

## 2023-07-02 DIAGNOSIS — G4733 Obstructive sleep apnea (adult) (pediatric): Secondary | ICD-10-CM | POA: Diagnosis not present

## 2023-07-04 ENCOUNTER — Other Ambulatory Visit: Payer: Self-pay | Admitting: Family Medicine

## 2023-07-05 MED ORDER — ONDANSETRON HCL 4 MG PO TABS
4.0000 mg | ORAL_TABLET | Freq: Three times a day (TID) | ORAL | 0 refills | Status: DC | PRN
  Filled 2023-07-05: qty 30, 10d supply, fill #0

## 2023-07-05 MED ORDER — AMPHETAMINE-DEXTROAMPHETAMINE 20 MG PO TABS
20.0000 mg | ORAL_TABLET | Freq: Every day | ORAL | 0 refills | Status: DC
Start: 2023-07-05 — End: 2023-09-18
  Filled 2023-07-05: qty 30, 30d supply, fill #0

## 2023-07-08 ENCOUNTER — Other Ambulatory Visit (HOSPITAL_BASED_OUTPATIENT_CLINIC_OR_DEPARTMENT_OTHER): Payer: Self-pay

## 2023-07-12 ENCOUNTER — Ambulatory Visit: Attending: Cardiology | Admitting: Cardiology

## 2023-07-12 ENCOUNTER — Other Ambulatory Visit (HOSPITAL_BASED_OUTPATIENT_CLINIC_OR_DEPARTMENT_OTHER): Payer: Self-pay

## 2023-07-12 VITALS — BP 134/76 | HR 67 | Ht 61.0 in | Wt 136.8 lb

## 2023-07-12 DIAGNOSIS — Z953 Presence of xenogenic heart valve: Secondary | ICD-10-CM

## 2023-07-12 DIAGNOSIS — E119 Type 2 diabetes mellitus without complications: Secondary | ICD-10-CM

## 2023-07-12 DIAGNOSIS — I480 Paroxysmal atrial fibrillation: Secondary | ICD-10-CM | POA: Diagnosis not present

## 2023-07-12 DIAGNOSIS — I428 Other cardiomyopathies: Secondary | ICD-10-CM | POA: Diagnosis not present

## 2023-07-12 DIAGNOSIS — E782 Mixed hyperlipidemia: Secondary | ICD-10-CM

## 2023-07-12 DIAGNOSIS — R0609 Other forms of dyspnea: Secondary | ICD-10-CM

## 2023-07-12 MED ORDER — DAPAGLIFLOZIN PROPANEDIOL 10 MG PO TABS
10.0000 mg | ORAL_TABLET | Freq: Every day | ORAL | 3 refills | Status: DC
Start: 2023-07-12 — End: 2024-01-08
  Filled 2023-07-12: qty 90, 90d supply, fill #0

## 2023-07-12 MED ORDER — DAPAGLIFLOZIN PROPANEDIOL 10 MG PO TABS: 10.0000 mg | ORAL_TABLET | Freq: Every day | ORAL | Status: DC

## 2023-07-12 NOTE — Progress Notes (Signed)
 Cardiology Office Note:    Date:  07/12/2023   ID:  LLEWELLYN ASWAD, DOB 1956/03/06, MRN [id number]  PCP:  Mercy Stall, MD  Cardiologist:  Ralene Burger, MD    Referring MD: Mercy Stall, MD   Chief Complaint  Patient presents with   Shawnie Delton results    History of Present Illness:    Lindsay Guerrero is a 68 y.o. female past medical history significant for arctic stenosis status post arctic valve replacement done in April 2021, cardiac catheterization at that time showed nonobstructive coronary artery disease, additional problem include essential hypertension paroxysmal atrial fibrillation status post atrial fibrillation ablation done in April 2023 since that time 2 episode of atrial fibrillation but stable otherwise.  Dyslipidemia refused to take statin. Comes today to my office for follow-up overall doing well denies have any chest pain tightness squeezing pressure burning in the chest.  Still very active she is upset with herself because she was in [place]  helping her friend who had hip surgery and she was not exercising enough she gained few pounds.  Past Medical History:  Diagnosis Date   Adult attention deficit disorder 05/15/2013   Anxiety state 11/19/2012   ICD10   Aortic stenosis 12/13/2017   Moderate by echocardiogram from 2019 mean gradient 25   Aortic stenosis, mild 07/13/2016   Moderate by echocardiogram from 2019 mean gradient 25   Aortic stenosis, severe 07/02/2019   Asthma    mild   CHF (congestive heart failure) (HCC)    Chronic bilateral low back pain 01/27/2015   Depression    Diabetes mellitus without complication (HCC)    borderline   Dyspnea    on exertion   Dyspnea on exertion 11/01/2017   Elective surgery for purposes other than treating health conditions 09/27/2010   ICD10   Essential hypertension 06/30/2018   Family history of colon cancer 03/01/2005   Family history of premature coronary artery disease 03/01/2005   GAD (generalized anxiety  disorder) 03/01/2005   Formatting of this note might be different from the original. with panic attacks   Hepatitis    due to cytomegalovirus-resolved   History of breast cancer 06/30/2018   History of hiatal hernia    mild,found on a CT   History of malignant neoplasm of breast 03/01/2005   Formatting of this note might be different from the original. node neg; see chartnotes for treatment   HTN (hypertension), benign 07/13/2016   Hyperlipidemia 06/30/2018   Hypokalemia 06/30/2018   Inflamed seborrheic keratosis 08/08/2012   Irritable bowel syndrome 03/01/2005   Malignant neoplasm of upper-inner quadrant of female breast (HCC) 2003   Primary  diagnosis   Mild intermittent asthma without complication 03/01/2005   Formatting of this note might be different from the original. mild   Mitral regurgitation 12/13/2017   Mild to moderate based on echo from 2019   Mixed hyperlipidemia 07/20/2019   NICM (nonischemic cardiomyopathy) (HCC) 11/01/2015   OSA (obstructive sleep apnea)    mild obstructive sleep apnea with an AHI of 10.9/h and O2 saturations as low as 81%.   Osteopenia 01/27/2015   Paroxysmal atrial fibrillation (HCC) 07/14/2019   Pneumonia 2018   Post menopausal problems 01/27/2015   Precordial pain 11/01/2015   Psoriatic arthritis (HCC) 04/25/2015   Recurrent genital HSV (herpes simplex virus) infection 03/01/2005   Formatting of this note might be different from the original. HSV genital   Restless legs syndrome 03/01/2005   Rosacea 03/01/2005   Routine history and physical examination  of adult 09/22/2012   Formatting of this note might be different from the original. PROBLEM LIST:  1. Left node negative breast cancer 2002, stage I, grade 1, status post  lumpectomy and node dissection, external beam radiation,  chemotherapy/Cytoxan, mild left arm lymphedema with cellulitis 2004.  2. Anxiety disorder with panic attacks, family history of same, globus  pharyngeus, palpitations,  atypical chest pain, str   S/P aortic valve replacement with bioprosthetic valve 21 mm Edwards INSPIRIS RESILIA  07/02/2019   Size 21 mm   Seborrheic keratoses 09/10/2016   Vaginal dryness 01/27/2015    Past Surgical History:  Procedure Laterality Date   AORTIC VALVE REPLACEMENT N/A 07/02/2019   Procedure: AORTIC VALVE REPLACEMENT (AVR) USING INSPIRIS VALVE SIZE ;  Surgeon: Bartley Lightning, MD;  Location: [facility] OR;  Service: Open Heart Surgery;  Laterality: N/A;   ATRIAL FIBRILLATION ABLATION N/A 07/05/2022   Procedure: ATRIAL FIBRILLATION ABLATION;  Surgeon: Lei Pump, MD;  Location: [facility] INVASIVE CV LAB;  Service: Cardiovascular;  Laterality: N/A;   BREAST LUMPECTOMY Left 2003   sentinal node bx.   CARDIOVERSION N/A 07/10/2019   Procedure: CARDIOVERSION;  Surgeon: Wendie Hamburg, MD;  Location: [facility] ENDOSCOPY;  Service: Cardiovascular;  Laterality: N/A;   CESAREAN SECTION     CLIPPING OF ATRIAL APPENDAGE N/A 07/02/2019   Procedure: CLIPPING OF ATRIAL APPENDAGE USING PRO2 CLIP SIZE ;  Surgeon: Bartley Lightning, MD;  Location: [facility] OR;  Service: Open Heart Surgery;  Laterality: N/A;   COSMETIC SURGERY     IR THORACENTESIS ASP PLEURAL SPACE W/IMG GUIDE  07/07/2019   RIGHT/LEFT HEART CATH AND CORONARY ANGIOGRAPHY N/A 05/20/2019   Procedure: RIGHT/LEFT HEART CATH AND CORONARY ANGIOGRAPHY;  Surgeon: Arnoldo Lapping, MD;  Location[facility] INVASIVE CV LAB;  Service: Cardiovascular;  Laterality: N/A;   TEE WITHOUT CARDIOVERSION N/A 07/02/2019   Procedure: TRANSESOPHAGEAL ECHOCARDIOGRAM (TEE);  Surgeon: Bartley Lightning, MD;  Location: [facility] OR;  Service: Open Heart Surgery;  Laterality: N/A;   TONSILLECTOMY      Current Medications: Current Meds  Medication Sig   acebutolol  (SECTRAL ) 200 MG capsule Take 1 capsule (200 mg total) by mouth daily.   acetaminophen  (TYLENOL ) 500 MG tablet Take 500-1,000 mg by mouth every 6 (six) hours as needed for moderate pain.   Acetylcysteine  (NAC) 600 MG  CAPS Take 600-1,200 mg by mouth daily.   amphetamine -dextroamphetamine  (ADDERALL) 20 MG tablet Take 1 tablet (20 mg total) by mouth daily.   diltiazem  (CARDIZEM ) 30 MG tablet Take 1 tablet (30 mg total) by mouth every 8 (eight) hours as needed (palpations).   diphenhydrAMINE  (BENADRYL ) 25 MG tablet Take 25 mg by mouth daily as needed for itching.   fluticasone  (FLONASE ) 50 MCG/ACT nasal spray Place 2 sprays into both nostrils daily.   furosemide  (LASIX ) 20 MG tablet Take 1 tablet (20 mg total) by mouth daily.   LORazepam  (ATIVAN ) 1 MG tablet Take 1 tablet (1 mg total) by mouth every 8 (eight) hours as needed for anxiety or sleep.   Melatonin 10 MG TABS Take 3 mg by mouth at bedtime as needed (sleep).   Methylsulfonylmethane (MSM PO) Take 2 tablets by mouth 2 (two) times daily.   omeprazole (PRILOSEC OTC) 20 MG tablet Take 20 mg by mouth daily as needed (acid reflux).   ondansetron  (ZOFRAN ) 4 MG tablet Take 1 tablet (4 mg total) by mouth every 8 (eight) hours as needed for nausea or vomiting.   sodium chloride  (OCEAN) 0.65 % SOLN nasal  spray Place 1 spray into both nostrils 2 (two) times daily.   triamcinolone cream (KENALOG) 0.1 % Apply 1 Application topically 2 (two) times daily as needed (psoriasis).   Vitamin D , Ergocalciferol , (DRISDOL ) 1.25 MG (50000 UNIT) CAPS capsule Take 1 capsule (50,000 Units total) by mouth every Monday.   vortioxetine  HBr (TRINTELLIX ) 5 MG TABS tablet Take 1 tablet (5 mg total) by mouth daily.   [DISCONTINUED] Acetylcysteine  (NAC PO) Take 1 tablet by mouth 2 (two) times daily.     Allergies:   Lisinopril, Prozac [fluoxetine], and Metoprolol    Social History   Socioeconomic History   Marital status: Married    Spouse name: Not on file   Number of children: 5   Years of education: Not on file   Highest education level: Associate degree: academic program  Occupational History   Occupation: retired  Tobacco Use   Smoking status: Never   Smokeless tobacco:  Never  Vaping Use   Vaping status: Never Used  Substance and Sexual Activity   Alcohol use: Yes    Comment: occasional social drinking (once per month)    Drug use: Yes    Types: Marijuana    Comment: smoked college years, chew THC gummies   Sexual activity: Yes    Partners: Male    Birth control/protection: None  Other Topics Concern   Not on file  Social History Narrative   Not on file   Social Drivers of Health   Financial Resource Strain: Low Risk  (04/02/2023)   Overall Financial Resource Strain (CARDIA)    Difficulty of Paying Living Expenses: Not hard at all  Food Insecurity: No Food Insecurity (04/02/2023)   Hunger Vital Sign    Worried About Running Out of Food in the Last Year: Never true    Ran Out of Food in the Last Year: Never true  Transportation Needs: No Transportation Needs (04/02/2023)   PRAPARE - Administrator, Civil Service (Medical): No    Lack of Transportation (Non-Medical): No  Physical Activity: Sufficiently Active (04/02/2023)   Exercise Vital Sign    Days of Exercise per Week: 6 days    Minutes of Exercise per Session: 40 min  Stress: Stress Concern Present (04/02/2023)   [employer] of Occupational Health - Occupational Stress Questionnaire    Feeling of Stress : Rather much  Social Connections: Socially Integrated (04/02/2023)   Social Connection and Isolation Panel [NHANES]    Frequency of Communication with Friends and Family: More than three times a week    Frequency of Social Gatherings with Friends and Family: Three times a week    Attends Religious Services: 1 to 4 times per year    Active Member of Clubs or Organizations: Yes    Attends Engineer, structural: More than 4 times per year    Marital Status: Married     Family History: The patient's family history includes AAA (abdominal aortic aneurysm) in her paternal grandfather; Blindness in her daughter; Colon cancer in her maternal grandmother; Developmental  delay in her daughter; Diabetes in her daughter; Heart Problems in her father and mother; Heart attack in her sister; Heart disease in her father, maternal grandmother, mother, and sister; Hypertension in her mother. ROS:   Please see the history of present illness.    All 14 point review of systems negative except as described per history of present illness  EKGs/Labs/Other Studies Reviewed:         Recent Labs: 07/31/2022: Magnesium   2.4 11/26/2022: TSH 2.030 04/18/2023: ALT 15; BUN 18; Creatinine, Ser 0.76; Hemoglobin 13.8; Platelets 301; Potassium 4.4; Sodium 140  Recent Lipid Panel    Component Value Date/Time   CHOL 250 (H) 04/18/2023 0904   TRIG 128 04/18/2023 0904   HDL 55 04/18/2023 0904   CHOLHDL 4.5 (H) 04/18/2023 0904   LDLCALC 172 (H) 04/18/2023 0904   LDLDIRECT 166 (H) 09/15/2020 1326    Physical Exam:    VS:  BP 134/76 (BP Location: Right Arm, Patient Position: Sitting)   Pulse 67   Ht 5\' 1"  (1.549 m)   Wt 136 lb 12.8 oz (62.1 kg)   LMP  (LMP Unknown)   SpO2 98%   BMI 25.85 kg/m     Wt Readings from Last 3 Encounters:  07/12/23 136 lb 12.8 oz (62.1 kg)  07/02/23 135 lb (61.2 kg)  04/18/23 134 lb 6.4 oz (61 kg)     GEN:  Well nourished, well developed in no acute distress HEENT: Normal NECK: No JVD; No carotid bruits LYMPHATICS: No lymphadenopathy CARDIAC: RRR, systolic ejection murmur grade 2/6 best heard at base, no rubs, no gallops RESPIRATORY:  Clear to auscultation without rales, wheezing or rhonchi  ABDOMEN: Soft, non-tender, non-distended MUSCULOSKELETAL:  No edema; No deformity  SKIN: Warm and dry LOWER EXTREMITIES: no swelling NEUROLOGIC:  Alert and oriented x 3 PSYCHIATRIC:  Normal affect   ASSESSMENT:    1. S/P aortic valve replacement with bioprosthetic valve 21 mm Edwards INSPIRIS RESILIA    2. Paroxysmal atrial fibrillation (HCC)   3. NICM (nonischemic cardiomyopathy) (HCC)   4. Type 2 diabetes mellitus without complication, without  long-term current use of insulin  (HCC)   5. Mixed hyperlipidemia    PLAN:    In order of problems listed above:  Status post aortic valve replacement.  Plan to recheck echocardiogram I will schedule her to have the test overall clinically doing well. Paroxysmal atrial fibrillation stable asymptomatic only 1 episode looks suspicious, still does not want to be anticoagulated. Nonischemic cardiomyopathy will repeat echocardiac recheck left ventricle ejection fraction. Type 2 diabetes hemoglobin A1c 6.6.  She asked me to give her Farxiga  which I do not mind. Mixed dyslipidemia LDL 172 which is incredibly high again Long discussion about potentially taking some medication she still refuses   Medication Adjustments/Labs and Tests Ordered: Current medicines are reviewed at length with the patient today.  Concerns regarding medicines are outlined above.  No orders of the defined types were placed in this encounter.  Medication changes: No orders of the defined types were placed in this encounter.   Signed, Manfred Seed, MD, [facility] 07/12/2023 8:28 AM    [facility]

## 2023-07-12 NOTE — Patient Instructions (Signed)
 Medication Instructions:   START: Farxiga  10mg  1 tablet daily   Lab Work: Your physician recommends that you return for lab work in: 1 week You need to have labs done when you are fasting.  You can come Monday through Friday 8:30 am to 12:00 pm and 1:15 to 4:30. You do not need to make an appointment as the order has already been placed. The labs you are going to have done are BMET.    Testing/Procedures: Your physician has requested that you have an echocardiogram. Echocardiography is a painless test that uses sound waves to create images of your heart. It provides your doctor with information about the size and shape of your heart and how well your heart's chambers and valves are working. This procedure takes approximately one hour. There are no restrictions for this procedure. Please do NOT wear cologne, perfume, aftershave, or lotions (deodorant is allowed). Please arrive 15 minutes prior to your appointment time.  Please note: We ask at that you not bring children with you during ultrasound (echo/ vascular) testing. Due to room size and safety concerns, children are not allowed in the ultrasound rooms during exams. Our front office staff cannot provide observation of children in our lobby area while testing is being conducted. An adult accompanying a patient to their appointment will only be allowed in the ultrasound room at the discretion of the ultrasound technician under special circumstances. We apologize for any inconvenience.    Follow-Up: At [facility], you and your health needs are our priority.  As part of our continuing mission to provide you with exceptional heart care, we have created designated Provider Care Teams.  These Care Teams include your primary Cardiologist (physician) and Advanced Practice Providers (APPs -  Physician Assistants and Nurse Practitioners) who all work together to provide you with the care you need, when you need it.  We recommend signing up for the  patient portal called "MyChart".  Sign up information is provided on this After Visit Summary.  MyChart is used to connect with patients for Virtual Visits (Telemedicine).  Patients are able to view lab/test results, encounter notes, upcoming appointments, etc.  Non-urgent messages can be sent to your provider as well.   To learn more about what you can do with MyChart, go to [email or web address].    Your next appointment:   6 month(s)  The format for your next appointment:   In Person  Provider:   Ralene Burger, MD    Other Instructions NA

## 2023-07-15 ENCOUNTER — Other Ambulatory Visit (HOSPITAL_BASED_OUTPATIENT_CLINIC_OR_DEPARTMENT_OTHER): Payer: Self-pay

## 2023-07-16 NOTE — Procedures (Signed)
   [facility] [address] [place], [place] [address] Tel: [phone]   Fax: [phone]  Polysomnography Interpretation  Patient Name:  Lindsay Guerrero, Lindsay Guerrero Date:  07/02/2023 Referring Physician:  Jacqueline Matsu, Md  Indications for Polysomnography The patient is a 68 year-old Female who is 5\' 1"  and weighs 135.0 lbs. Her BMI equals 25.8.  A full night polysomnogram was performed to evaluate for Obstructive Sleep Apnea.  Medication  Ativan    Polysomnogram Data A full night polysomnogram recorded the standard physiologic parameters including EEG, EOG, EMG, EKG, nasal and oral airflow.  Respiratory parameters of chest and abdominal movements were recorded with Respiratory Inductance Plethysmography belts.  Oxygen saturation was recorded by pulse oximetry.   Sleep Architecture The total recording time of the polysomnogram was 372.0 minutes.  The total sleep time was 335.5 minutes.  The patient spent 2.2% of total sleep time in Stage N1, 73.5% in Stage N2, 8.0% in Stages N3, and 16.2% in REM.  Sleep latency was 13.0 minutes.  REM latency was 94.0 minutes.  Sleep Efficiency was 90.2%.  Wake after Sleep Onset time was 23.5 minutes.  Respiratory Events The polysomnogram revealed a presence of 0 obstructive, 0 central, and 0 mixed apneas resulting in an Apnea index of 0 events per hour.  There were 97 hypopneas (>=3% desaturation and/or arousal) resulting in an Apnea\Hypopnea Index (AHI >=3% desaturation and/or arousal) of 17.3 events per hour.  There were 74 hypopneas (>=4% desaturation) resulting in an Apnea\Hypopnea Index (AHI >=4% desaturation) of 13.2 events per hour.  There were 0 Respiratory Effort Related Arousals resulting in a RERA index of 0 events per hour. The Respiratory Disturbance Index is 17.3 events per hour.  The snore index was 0 events per hour.  Mean oxygen saturation was 91.8%.  The lowest oxygen saturation during sleep was 76.0%.   Time spent <=88% oxygen saturation was 22.1 minutes (6.0%).  Limb Activity There were 358 total limb movements recorded, of this total, 355 were classified as PLMs.  PLM index was 63.5 per hour and PLM associated with Arousals index was 1.1 per hour.  Cardiac Summary The average pulse rate was 71.9 bpm.  The minimum pulse rate was 58.0 bpm while the maximum pulse rate was 93.0 bpm.  Cardiac rhythm was normal..  Diagnosis:  Mild Obstructive Sleep Apnea Nocturnal Hypoxemia  Recommendations: Recommend a trial of ResMed auto CPAP from 4 to 15cm H2O with heated humidity and mask of choice. The patient should be encouraged to avoid sleeping in the supine position. The patient should be counseled in good sleep hygiene. Encourage patient to avoid driving when sleepy. Followup in Sleep Medicine clinic in 6 weeks.   This study was personally reviewed and electronically signed by: Jacqueline Matsu, Md Accredited Board Certified in Sleep Medicine Date/Time: 07/16/2023 8:30PM

## 2023-07-17 DIAGNOSIS — R0609 Other forms of dyspnea: Secondary | ICD-10-CM | POA: Diagnosis not present

## 2023-07-17 NOTE — Progress Notes (Addendum)
 Subjective:  Patient ID: Lindsay Guerrero, female    DOB: 06-19-1955  Age: 68 y.o. MRN: [id number]  Chief Complaint  Patient presents with   Medical Management of Chronic Issues    HPI: Afib: She is taking acebutolol  200 mg daily. She sees Dr Linnell Richardson due to afib.    ADHD: She has been on Adderall 20 mg daily.   Hypertension: Takes acebutolol  200 mg daily, furosemide  20 mg daily.   Hyperlipidemia: previously on pravastatin  in 2022.    Patient has a history of aortic valve regurgitation which required a aortic valve replacement with a bioprosthetic valve (21 mm Edwards Inspira's Resilia.)  Patient requires amoxicillin  prophylaxis for any dental procedures.   GAD: Currently on lorazepam  1 mg 1 p.o. 3 times daily as needed.  Usually takes once daily. Previously, took wellbutrin , zoloft, celexa. They helped. Intolerant to prozac. Trintellix  is too expensive and she is requesting an alternative medicine. It has helped her depression. She has started sewing for a hobby.   Vitamin D  deficiency: Currently on vitamin D  50 K weekly. Gets a large amount of calcium in diet.    Diabetes: Last A1C 6.6. Patient does not check her sugars.  Patient does try to eat healthy. Walks daily 1/2 - 2 miles. Farxiga  is to expensive. See's Dr. Ivette Marks for eye exam.     07/18/2023    9:59 AM 04/18/2023    7:58 AM 11/26/2022   10:40 AM 07/24/2022   10:57 AM  Depression screen PHQ 2/9  Decreased Interest 0 0 1 0  Down, Depressed, Hopeless 1 1 0 1  PHQ - 2 Score 1 1 1 1   Altered sleeping 1 2 2 3   Tired, decreased energy 0 0 1 3  Change in appetite 0 0 0 0  Feeling bad or failure about yourself  0 0 0 0  Trouble concentrating 1 0 0 0  Moving slowly or fidgety/restless 0 0 0 0  Suicidal thoughts 0 0 0 0  PHQ-9 Score 3 3 4 7   Difficult doing work/chores Somewhat difficult Not difficult at all Not difficult at all Somewhat difficult        04/18/2023    7:58 AM  Fall Risk   Falls in the past year? 1  Number falls  in past yr: 0  Injury with Fall? 0  Risk for fall due to : No Fall Risks      07/18/2023   10:00 AM 04/18/2023    7:58 AM 11/26/2022   10:42 AM 07/24/2022   10:58 AM  GAD 7 : Generalized Anxiety Score  Nervous, Anxious, on Edge 1 2 2 3   Control/stop worrying 1 1 0 0  Worry too much - different things 1 0 1 0  Trouble relaxing 0 2 2 2   Restless 0 0 0 0  Easily annoyed or irritable 1 1 1 1   Afraid - awful might happen 0 0 0 0  Total GAD 7 Score 4 6 6 6   Anxiety Difficulty Somewhat difficult  Not difficult at all     Patient Care Team: Mercy Stall, MD as PCP - General (Family Medicine) Manfred Seed, MD as PCP - Cardiology (Cardiology) Lei Pump, MD as PCP - Electrophysiology (Cardiology) Alfornia Anis., OD (Optometry)   Review of Systems  Constitutional:  Negative for chills, fatigue and fever.  HENT:  Negative for congestion, ear pain, rhinorrhea and sore throat.   Respiratory:  Negative for cough and shortness of breath.  Cardiovascular:  Negative for chest pain.  Gastrointestinal:  Negative for abdominal pain, constipation, diarrhea, nausea and vomiting.  Genitourinary:  Negative for dysuria and urgency.  Musculoskeletal:  Positive for back pain. Negative for myalgias.  Neurological:  Negative for dizziness, weakness, light-headedness and headaches.  Psychiatric/Behavioral:  Negative for dysphoric mood. The patient is not nervous/anxious.     Current Outpatient Medications on File Prior to Visit  Medication Sig Dispense Refill   acebutolol  (SECTRAL ) 200 MG capsule Take 1 capsule (200 mg total) by mouth daily. 90 capsule 3   acetaminophen  (TYLENOL ) 500 MG tablet Take 500-1,000 mg by mouth every 6 (six) hours as needed for moderate pain.     Acetylcysteine  (NAC) 600 MG CAPS Take 600-1,200 mg by mouth daily.     amphetamine -dextroamphetamine  (ADDERALL) 20 MG tablet Take 1 tablet (20 mg total) by mouth daily. 30 tablet 0   dapagliflozin  propanediol  (FARXIGA ) 10 MG TABS tablet Take 1 tablet (10 mg total) by mouth daily before breakfast. 90 tablet 3   diltiazem  (CARDIZEM ) 30 MG tablet Take 1 tablet (30 mg total) by mouth every 8 (eight) hours as needed (palpations). 60 tablet 1   diphenhydrAMINE  (BENADRYL ) 25 MG tablet Take 25 mg by mouth daily as needed for itching.     fluticasone  (FLONASE ) 50 MCG/ACT nasal spray Place 2 sprays into both nostrils daily. 16 g 6   furosemide  (LASIX ) 20 MG tablet Take 1 tablet (20 mg total) by mouth daily. 90 tablet 1   LORazepam  (ATIVAN ) 1 MG tablet Take 1 tablet (1 mg total) by mouth every 8 (eight) hours as needed for anxiety or sleep. 90 tablet 2   Melatonin 10 MG TABS Take 3 mg by mouth at bedtime as needed (sleep).     omeprazole (PRILOSEC OTC) 20 MG tablet Take 20 mg by mouth daily as needed (acid reflux).     ondansetron  (ZOFRAN ) 4 MG tablet Take 1 tablet (4 mg total) by mouth every 8 (eight) hours as needed for nausea or vomiting. 30 tablet 0   sodium chloride  (OCEAN) 0.65 % SOLN nasal spray Place 1 spray into both nostrils 2 (two) times daily.     triamcinolone cream (KENALOG) 0.1 % Apply 1 Application topically 2 (two) times daily as needed (psoriasis).     Vitamin D , Ergocalciferol , (DRISDOL ) 1.25 MG (50000 UNIT) CAPS capsule Take 1 capsule (50,000 Units total) by mouth every Monday. 5 capsule 2   No current facility-administered medications on file prior to visit.   Past Medical History:  Diagnosis Date   Adult attention deficit disorder 05/15/2013   Anxiety state 11/19/2012   ICD10   Aortic stenosis 12/13/2017   Moderate by echocardiogram from 2019 mean gradient 25   Aortic stenosis, mild 07/13/2016   Moderate by echocardiogram from 2019 mean gradient 25   Aortic stenosis, severe 07/02/2019   Asthma    mild   CHF (congestive heart failure) (HCC)    Chronic bilateral low back pain 01/27/2015   Depression    Diabetes mellitus without complication (HCC)    borderline   Dyspnea    on  exertion   Dyspnea on exertion 11/01/2017   Elective surgery for purposes other than treating health conditions 09/27/2010   ICD10   Essential hypertension 06/30/2018   Family history of colon cancer 03/01/2005   Family history of premature coronary artery disease 03/01/2005   GAD (generalized anxiety disorder) 03/01/2005   Formatting of this note might be different from the original. with panic  attacks   Hepatitis    due to cytomegalovirus-resolved   History of breast cancer 06/30/2018   History of hiatal hernia    mild,found on a CT   History of malignant neoplasm of breast 03/01/2005   Formatting of this note might be different from the original. node neg; see chartnotes for treatment   HTN (hypertension), benign 07/13/2016   Hyperlipidemia 06/30/2018   Hypokalemia 06/30/2018   Inflamed seborrheic keratosis 08/08/2012   Irritable bowel syndrome 03/01/2005   Malignant neoplasm of upper-inner quadrant of female breast (HCC) 2003   Primary  diagnosis   Mild intermittent asthma without complication 03/01/2005   Formatting of this note might be different from the original. mild   Mitral regurgitation 12/13/2017   Mild to moderate based on echo from 2019   Mixed hyperlipidemia 07/20/2019   NICM (nonischemic cardiomyopathy) (HCC) 11/01/2015   OSA (obstructive sleep apnea)    mild obstructive sleep apnea with an AHI of 10.9/h and O2 saturations as low as 81%.   Osteopenia 01/27/2015   Paroxysmal atrial fibrillation (HCC) 07/14/2019   Pneumonia 2018   Post menopausal problems 01/27/2015   Precordial pain 11/01/2015   Psoriatic arthritis (HCC) 04/25/2015   Recurrent genital HSV (herpes simplex virus) infection 03/01/2005   Formatting of this note might be different from the original. HSV genital   Restless legs syndrome 03/01/2005   Rosacea 03/01/2005   Routine history and physical examination of adult 09/22/2012   Formatting of this note might be different from the original.  PROBLEM LIST:  1. Left node negative breast cancer 2002, stage I, grade 1, status post  lumpectomy and node dissection, external beam radiation,  chemotherapy/Cytoxan, mild left arm lymphedema with cellulitis 2004.  2. Anxiety disorder with panic attacks, family history of same, globus  pharyngeus, palpitations, atypical chest pain, str   S/P aortic valve replacement with bioprosthetic valve 21 mm Edwards INSPIRIS RESILIA  07/02/2019   Size 21 mm   Seborrheic keratoses 09/10/2016   Vaginal dryness 01/27/2015   Past Surgical History:  Procedure Laterality Date   AORTIC VALVE REPLACEMENT N/A 07/02/2019   Procedure: AORTIC VALVE REPLACEMENT (AVR) USING INSPIRIS VALVE SIZE ;  Surgeon: Bartley Lightning, MD;  Location: [facility] OR;  Service: Open Heart Surgery;  Laterality: N/A;   ATRIAL FIBRILLATION ABLATION N/A 07/05/2022   Procedure: ATRIAL FIBRILLATION ABLATION;  Surgeon: Lei Pump, MD;  Location: [facility] INVASIVE CV LAB;  Service: Cardiovascular;  Laterality: N/A;   BREAST LUMPECTOMY Left 2003   sentinal node bx.   CARDIOVERSION N/A 07/10/2019   Procedure: CARDIOVERSION;  Surgeon: Wendie Hamburg, MD;  Location: [facility] ENDOSCOPY;  Service: Cardiovascular;  Laterality: N/A;   CESAREAN SECTION     CLIPPING OF ATRIAL APPENDAGE N/A 07/02/2019   Procedure: CLIPPING OF ATRIAL APPENDAGE USING PRO2 CLIP SIZE ;  Surgeon: Bartley Lightning, MD;  Location[facility];  Service: Open Heart Surgery;  Laterality: N/A;   COSMETIC SURGERY     IR THORACENTESIS ASP PLEURAL SPACE W/IMG GUIDE  07/07/2019   RIGHT/LEFT HEART CATH AND CORONARY ANGIOGRAPHY N/A 05/20/2019   Procedure: RIGHT/LEFT HEART CATH AND CORONARY ANGIOGRAPHY;  Surgeon: Arnoldo Lapping, MD;  Location: [facility] INVASIVE CV LAB;  Service: Cardiovascular;  Laterality: N/A;   TEE WITHOUT CARDIOVERSION N/A 07/02/2019   Procedure: TRANSESOPHAGEAL ECHOCARDIOGRAM (TEE);  Surgeon: Bartley Lightning, MD;  Location: [facility] OR;  Service: Open Heart Surgery;  Laterality:  N/A;   TONSILLECTOMY      Family History  Problem Relation  Age of Onset   Heart Problems Mother    Hypertension Mother    Heart disease Mother    Heart Problems Father    Heart disease Father    Heart disease Sister    Heart attack Sister    Blindness Daughter    Diabetes Daughter    Developmental delay Daughter    Colon cancer Maternal Grandmother    Heart disease Maternal Grandmother    AAA (abdominal aortic aneurysm) Paternal Grandfather    Social History   Socioeconomic History   Marital status: Married    Spouse name: Not on file   Number of children: 5   Years of education: Not on file   Highest education level: Associate degree: academic program  Occupational History   Occupation: retired  Tobacco Use   Smoking status: Never   Smokeless tobacco: Never  Vaping Use   Vaping status: Never Used  Substance and Sexual Activity   Alcohol use: Yes    Comment: occasional social drinking (once per month)    Drug use: Yes    Types: Marijuana    Comment: smoked college years, chew THC gummies   Sexual activity: Yes    Partners: Male    Birth control/protection: None  Other Topics Concern   Not on file  Social History Narrative   Not on file   Social Drivers of Health   Financial Resource Strain: Low Risk  (04/02/2023)   Overall Financial Resource Strain (CARDIA)    Difficulty of Paying Living Expenses: Not hard at all  Food Insecurity: No Food Insecurity (04/02/2023)   Hunger Vital Sign    Worried About Running Out of Food in the Last Year: Never true    Ran Out of Food in the Last Year: Never true  Transportation Needs: No Transportation Needs (04/02/2023)   PRAPARE - Administrator, Civil Service (Medical): No    Lack of Transportation (Non-Medical): No  Physical Activity: Sufficiently Active (04/02/2023)   Exercise Vital Sign    Days of Exercise per Week: 6 days    Minutes of Exercise per Session: 40 min  Stress: Stress Concern Present (04/02/2023)    [employer] of Occupational Health - Occupational Stress Questionnaire    Feeling of Stress : Rather much  Social Connections: Socially Integrated (04/02/2023)   Social Connection and Isolation Panel [NHANES]    Frequency of Communication with Friends and Family: More than three times a week    Frequency of Social Gatherings with Friends and Family: Three times a week    Attends Religious Services: 1 to 4 times per year    Active Member of Clubs or Organizations: Yes    Attends Engineer, structural: More than 4 times per year    Marital Status: Married    Objective:  BP 104/62   Pulse 84   Temp 98.2 F (36.8 C)   Ht 5\' 1"  (1.549 m)   Wt 134 lb (60.8 kg)   LMP  (LMP Unknown)   SpO2 97%   BMI 25.32 kg/m      07/18/2023    9:53 AM 07/12/2023    8:08 AM 07/02/2023    7:57 PM  BP/Weight  Systolic BP 104 134   Diastolic BP 62 76   Wt. (Lbs) 134 136.8 135  BMI 25.32 kg/m2 25.85 kg/m2 25.51 kg/m2    Physical Exam Vitals reviewed.  Constitutional:      Appearance: Normal appearance. She is normal weight.  Neck:  Vascular: No carotid bruit.  Cardiovascular:     Rate and Rhythm: Normal rate and regular rhythm.     Heart sounds: Murmur heard.  Pulmonary:     Effort: Pulmonary effort is normal. No respiratory distress.     Breath sounds: Normal breath sounds.  Abdominal:     General: Abdomen is flat. Bowel sounds are normal.     Palpations: Abdomen is soft.     Tenderness: There is no abdominal tenderness.  Neurological:     Mental Status: She is alert and oriented to person, place, and time.  Psychiatric:        Mood and Affect: Mood normal.        Behavior: Behavior normal.     Diabetic Foot Exam - Simple   Simple Foot Form  07/17/2023 10:42 PM  Visual Inspection No deformities, no ulcerations, no other skin breakdown bilaterally: Yes Sensation Testing Intact to touch and monofilament testing bilaterally: Yes Pulse Check Posterior Tibialis and  Dorsalis pulse intact bilaterally: Yes Comments      Lab Results  Component Value Date   WBC 5.3 07/18/2023   HGB 13.1 07/18/2023   HCT 40.7 07/18/2023   PLT 294 07/18/2023   GLUCOSE 127 (H) 07/18/2023   CHOL 242 (H) 07/18/2023   TRIG 91 07/18/2023   HDL 54 07/18/2023   LDLDIRECT 166 (H) 09/15/2020   LDLCALC 172 (H) 07/18/2023   ALT 12 07/18/2023   AST 15 07/18/2023   NA 140 07/18/2023   K 4.1 07/18/2023   CL 99 07/18/2023   CREATININE 0.96 07/18/2023   BUN 20 07/18/2023   CO2 25 07/18/2023   TSH 2.030 11/26/2022   INR 1.6 (H) 07/02/2019   HGBA1C 6.8 (H) 07/18/2023      Assessment & Plan:  Essential hypertension, benign Assessment & Plan: Hypertension is well-controlled.  Medicines: Takes acebutolol  200 mg daily, furosemide  20 mg daily.  Continue to work on eating a healthy diet and exercise.  Labs drawn today  Orders: -     Comprehensive metabolic panel with GFR  GAD (generalized anxiety disorder) Assessment & Plan: The current medical regimen is effective;  continue present plan and medications.  Currently on lorazepam  1 mg 1 p.o. 3 times daily as needed. Usually takes once daily.  Start on Effexor  75 mg 1 tablet daily. Stop trintillex.    Orders: -     Venlafaxine  HCl ER; Take 1 capsule (75 mg total) by mouth daily with breakfast.  Dispense: 90 capsule; Refill: 0  Mixed diabetic hyperlipidemia associated with type 2 diabetes mellitus (HCC) Assessment & Plan: Diabetes and hyperlipidemia: well controlled. Recommend check feet daily. Recommend annual eye exams. Medicines: none currently. Continue to work on eating a healthy diet and exercise.  Labs drawn today.     Orders: -     CBC with Differential/Platelet -     Lipid panel -     Hemoglobin A1c  OSA (obstructive sleep apnea) Assessment & Plan: Stable.   Paroxysmal atrial fibrillation (HCC) Assessment & Plan: Management per specialist. Appears to be in normal sinus rhythm on exam.    Continue on Acebutolol  200 mg daily. Had ablation and watchman. No longer on eliquis .  Has not required IR diltiazem  for nearly a year.    Mild vitamin D  deficiency Assessment & Plan: Continue vitamin d  weekly.    S/P aortic valve replacement with bioprosthetic valve Assessment & Plan: Requires amoxicillin  prophylaxis for ental procedures.    DM type 2 with diabetic  mixed hyperlipidemia (HCC) Assessment & Plan: Diabetes and hyperlipidemia: well controlled. Recommend check feet daily. Recommend annual eye exams. Medicines: none currently. Continue to work on eating a healthy diet and exercise.  Labs drawn today.         Meds ordered this encounter  Medications   venlafaxine  XR (EFFEXOR  XR) 75 MG 24 hr capsule    Sig: Take 1 capsule (75 mg total) by mouth daily with breakfast.    Dispense:  90 capsule    Refill:  0    Orders Placed This Encounter  Procedures   CBC with Differential/Platelet   Comprehensive metabolic panel with GFR   Lipid panel   Hemoglobin A1c     Follow-up: Return in about 3 months (around 10/18/2023) for chronic follow up.   I,Katherina A Bramblett,acting as a scribe for Mercy Stall, MD.,have documented all relevant documentation on the behalf of Mercy Stall, MD,as directed by  Mercy Stall, MD while in the presence of Mercy Stall, MD.   An After Visit Summary was printed and given to the patient.  Mercy Stall, MD [facility] [phone]

## 2023-07-18 ENCOUNTER — Telehealth: Payer: Self-pay

## 2023-07-18 ENCOUNTER — Ambulatory Visit: Payer: Medicare Other | Admitting: Family Medicine

## 2023-07-18 ENCOUNTER — Encounter: Payer: Self-pay | Admitting: Family Medicine

## 2023-07-18 ENCOUNTER — Telehealth: Payer: Self-pay | Admitting: *Deleted

## 2023-07-18 ENCOUNTER — Other Ambulatory Visit (HOSPITAL_BASED_OUTPATIENT_CLINIC_OR_DEPARTMENT_OTHER): Payer: Self-pay

## 2023-07-18 VITALS — BP 104/62 | HR 84 | Temp 98.2°F | Ht 61.0 in | Wt 134.0 lb

## 2023-07-18 DIAGNOSIS — G4733 Obstructive sleep apnea (adult) (pediatric): Secondary | ICD-10-CM

## 2023-07-18 DIAGNOSIS — F411 Generalized anxiety disorder: Secondary | ICD-10-CM

## 2023-07-18 DIAGNOSIS — I10 Essential (primary) hypertension: Secondary | ICD-10-CM | POA: Diagnosis not present

## 2023-07-18 DIAGNOSIS — E782 Mixed hyperlipidemia: Secondary | ICD-10-CM | POA: Diagnosis not present

## 2023-07-18 DIAGNOSIS — Z953 Presence of xenogenic heart valve: Secondary | ICD-10-CM

## 2023-07-18 DIAGNOSIS — I4819 Other persistent atrial fibrillation: Secondary | ICD-10-CM

## 2023-07-18 DIAGNOSIS — I480 Paroxysmal atrial fibrillation: Secondary | ICD-10-CM

## 2023-07-18 DIAGNOSIS — E119 Type 2 diabetes mellitus without complications: Secondary | ICD-10-CM

## 2023-07-18 DIAGNOSIS — E1169 Type 2 diabetes mellitus with other specified complication: Secondary | ICD-10-CM | POA: Diagnosis not present

## 2023-07-18 DIAGNOSIS — E559 Vitamin D deficiency, unspecified: Secondary | ICD-10-CM

## 2023-07-18 LAB — BASIC METABOLIC PANEL WITH GFR
BUN/Creatinine Ratio: 21 (ref 12–28)
BUN: 16 mg/dL (ref 8–27)
CO2: 24 mmol/L (ref 20–29)
Calcium: 9.5 mg/dL (ref 8.7–10.3)
Chloride: 100 mmol/L (ref 96–106)
Creatinine, Ser: 0.75 mg/dL (ref 0.57–1.00)
Glucose: 121 mg/dL — ABNORMAL HIGH (ref 70–99)
Potassium: 4.5 mmol/L (ref 3.5–5.2)
Sodium: 139 mmol/L (ref 134–144)
eGFR: 86 mL/min/{1.73_m2} (ref >59)

## 2023-07-18 LAB — HEMOGLOBIN A1C
Est. average glucose Bld gHb Est-mCnc: 148 mg/dL
Hgb A1c MFr Bld: 6.8 % — ABNORMAL HIGH (ref 4.8–5.6)

## 2023-07-18 MED ORDER — VENLAFAXINE HCL ER 75 MG PO CP24
75.0000 mg | ORAL_CAPSULE | Freq: Every day | ORAL | 0 refills | Status: DC
  Filled 2023-07-18: qty 90, 90d supply, fill #0

## 2023-07-18 NOTE — Assessment & Plan Note (Signed)
 Stable

## 2023-07-18 NOTE — Assessment & Plan Note (Addendum)
 The current medical regimen is effective;  continue present plan and medications.  Currently on lorazepam  1 mg 1 p.o. 3 times daily as needed. Usually takes once daily.  Start on Effexor  75 mg 1 tablet daily. Stop trintillex.

## 2023-07-18 NOTE — Telephone Encounter (Signed)
 FYI please let patient know that she does not qualify for [facility]

## 2023-07-18 NOTE — Telephone Encounter (Signed)
 Left VM with callback number for patient to receive sleep study results and recommendations.

## 2023-07-18 NOTE — Assessment & Plan Note (Addendum)
 Diabetes and hyperlipidemia: well controlled. Recommend check feet daily. Recommend annual eye exams. Medicines: none currently. Continue to work on eating a healthy diet and exercise.  Labs drawn today.

## 2023-07-18 NOTE — Assessment & Plan Note (Signed)
 Fair controlled.  No taking medication. Continue to work on eating a healthy diet and exercise.  Labs drawn today.

## 2023-07-18 NOTE — Telephone Encounter (Signed)
-----   Message from Gaylyn Keas sent at 07/16/2023  8:31 PM EDT ----- Please let patient know that they have sleep apnea and recommend treating with CPAP.  Please order an auto CPAP from 4-15cm H2O with heated humidity and mask of choice.  Order overnight pulse ox on CPAP.  Followup with me in 6 weeks.

## 2023-07-18 NOTE — Assessment & Plan Note (Signed)
 Hypertension is well-controlled.  Medicines: Takes acebutolol  200 mg daily, furosemide  20 mg daily.  Continue to work on eating a healthy diet and exercise.  Labs drawn today

## 2023-07-19 ENCOUNTER — Encounter: Payer: Self-pay | Admitting: Family Medicine

## 2023-07-19 LAB — COMPREHENSIVE METABOLIC PANEL WITH GFR
ALT: 12 IU/L (ref 0–32)
AST: 15 IU/L (ref 0–40)
Albumin: 4.7 g/dL (ref 3.9–4.9)
Alkaline Phosphatase: 84 IU/L (ref 44–121)
BUN/Creatinine Ratio: 21 (ref 12–28)
BUN: 20 mg/dL (ref 8–27)
Bilirubin Total: <0.2 mg/dL (ref 0.0–1.2)
CO2: 25 mmol/L (ref 20–29)
Calcium: 9.4 mg/dL (ref 8.7–10.3)
Chloride: 99 mmol/L (ref 96–106)
Creatinine, Ser: 0.96 mg/dL (ref 0.57–1.00)
Globulin, Total: 2.5 g/dL (ref 1.5–4.5)
Glucose: 127 mg/dL — ABNORMAL HIGH (ref 70–99)
Potassium: 4.1 mmol/L (ref 3.5–5.2)
Sodium: 140 mmol/L (ref 134–144)
Total Protein: 7.2 g/dL (ref 6.0–8.5)
eGFR: 64 mL/min/{1.73_m2} (ref >59)

## 2023-07-19 LAB — CBC WITH DIFFERENTIAL/PLATELET
Basophils Absolute: 0 10*3/uL (ref 0.0–0.2)
Basos: 1 %
EOS (ABSOLUTE): 0.1 10*3/uL (ref 0.0–0.4)
Eos: 1 %
Hematocrit: 40.7 % (ref 34.0–46.6)
Hemoglobin: 13.1 g/dL (ref 11.1–15.9)
Immature Grans (Abs): 0 10*3/uL (ref 0.0–0.1)
Immature Granulocytes: 0 %
Lymphocytes Absolute: 1.6 10*3/uL (ref 0.7–3.1)
Lymphs: 31 %
MCH: 28.9 pg (ref 26.6–33.0)
MCHC: 32.2 g/dL (ref 31.5–35.7)
MCV: 90 fL (ref 79–97)
Monocytes Absolute: 0.5 10*3/uL (ref 0.1–0.9)
Monocytes: 9 %
Neutrophils Absolute: 3.1 10*3/uL (ref 1.4–7.0)
Neutrophils: 58 %
Platelets: 294 10*3/uL (ref 150–450)
RBC: 4.54 x10E6/uL (ref 3.77–5.28)
RDW: 13.1 % (ref 11.7–15.4)
WBC: 5.3 10*3/uL (ref 3.4–10.8)

## 2023-07-19 LAB — LIPID PANEL
Chol/HDL Ratio: 4.5 ratio — ABNORMAL HIGH (ref 0.0–4.4)
Cholesterol, Total: 242 mg/dL — ABNORMAL HIGH (ref 100–199)
HDL: 54 mg/dL (ref >39)
LDL Chol Calc (NIH): 172 mg/dL — ABNORMAL HIGH (ref 0–99)
Triglycerides: 91 mg/dL (ref 0–149)
VLDL Cholesterol Cal: 16 mg/dL (ref 5–40)

## 2023-07-22 NOTE — Telephone Encounter (Signed)
 The patient has been notified of the result and verbalized understanding.  All questions (if any) were answered. Gaylene Kays, CMA 07/22/2023 1:52 PM    Patient understands she is not an inspire candidate.  Upon patient request DME selection is [facility] Patient understands he will be contacted by [facility] to set up his cpap. Patient understands to call if [facility] does not contact him with new setup in a timely manner. Patient understands they will be called once confirmation has been received from [facility] that they have received their new machine to schedule 10 week follow up appointment.   [facility] notified of new cpap order  Please add to airview Patient was grateful for the call and thanked me

## 2023-07-24 ENCOUNTER — Encounter: Payer: Self-pay | Admitting: Family Medicine

## 2023-07-24 ENCOUNTER — Other Ambulatory Visit (HOSPITAL_BASED_OUTPATIENT_CLINIC_OR_DEPARTMENT_OTHER): Payer: Self-pay

## 2023-07-28 NOTE — Assessment & Plan Note (Signed)
Continue vitamin d weekly

## 2023-07-28 NOTE — Assessment & Plan Note (Signed)
 Management per specialist. Appears to be in normal sinus rhythm on exam.   Continue on Acebutolol  200 mg daily. Had ablation and watchman. No longer on eliquis .  Has not required IR diltiazem  for nearly a year.

## 2023-07-28 NOTE — Assessment & Plan Note (Signed)
 Requires amoxicillin  prophylaxis for ental procedures.

## 2023-07-29 DIAGNOSIS — G4733 Obstructive sleep apnea (adult) (pediatric): Secondary | ICD-10-CM | POA: Diagnosis not present

## 2023-07-30 ENCOUNTER — Ambulatory Visit: Attending: Cardiology

## 2023-07-30 DIAGNOSIS — R0609 Other forms of dyspnea: Secondary | ICD-10-CM

## 2023-07-31 LAB — ECHOCARDIOGRAM COMPLETE
AR max vel: 1.34 cm2
AV Area VTI: 1.3 cm2
AV Area mean vel: 1.32 cm2
AV Mean grad: 8.5 mmHg
AV Peak grad: 15.9 mmHg
Ao pk vel: 2 m/s
Area-P 1/2: 4.01 cm2
S' Lateral: 2.8 cm

## 2023-08-01 ENCOUNTER — Ambulatory Visit: Payer: Self-pay | Admitting: Cardiology

## 2023-08-23 NOTE — Telephone Encounter (Signed)
 Patient states she is not using her cpap and is trying to decide whether to turn it back in or not. As of right now she still has her cpap.  [place], Alonso Jan, CMA; [place], [place]; Able, Jada; Rose, Leslie; Shoffner, Ivette Marks Hello Patient does not have a cpap unit. Please give our office a call to discuss                               auto CPAP from 4-15cm H2O with heated humidity and mask of choice.   Order overnight pulse ox on CPAP

## 2023-08-29 DIAGNOSIS — G4733 Obstructive sleep apnea (adult) (pediatric): Secondary | ICD-10-CM | POA: Diagnosis not present

## 2023-08-31 DIAGNOSIS — K529 Noninfective gastroenteritis and colitis, unspecified: Secondary | ICD-10-CM | POA: Diagnosis not present

## 2023-08-31 DIAGNOSIS — R1032 Left lower quadrant pain: Secondary | ICD-10-CM | POA: Diagnosis not present

## 2023-08-31 DIAGNOSIS — R197 Diarrhea, unspecified: Secondary | ICD-10-CM | POA: Diagnosis not present

## 2023-08-31 DIAGNOSIS — I4891 Unspecified atrial fibrillation: Secondary | ICD-10-CM | POA: Diagnosis not present

## 2023-08-31 DIAGNOSIS — I10 Essential (primary) hypertension: Secondary | ICD-10-CM | POA: Diagnosis not present

## 2023-08-31 LAB — LAB REPORT - SCANNED: EGFR: 69

## 2023-09-03 ENCOUNTER — Ambulatory Visit: Admitting: Family Medicine

## 2023-09-03 ENCOUNTER — Other Ambulatory Visit (HOSPITAL_BASED_OUTPATIENT_CLINIC_OR_DEPARTMENT_OTHER): Payer: Self-pay

## 2023-09-03 VITALS — BP 116/84 | HR 73 | Temp 98.0°F | Ht 61.0 in | Wt 130.0 lb

## 2023-09-03 DIAGNOSIS — K5790 Diverticulosis of intestine, part unspecified, without perforation or abscess without bleeding: Secondary | ICD-10-CM

## 2023-09-03 DIAGNOSIS — R159 Full incontinence of feces: Secondary | ICD-10-CM | POA: Diagnosis not present

## 2023-09-03 DIAGNOSIS — K529 Noninfective gastroenteritis and colitis, unspecified: Secondary | ICD-10-CM

## 2023-09-03 DIAGNOSIS — K580 Irritable bowel syndrome with diarrhea: Secondary | ICD-10-CM

## 2023-09-03 DIAGNOSIS — F411 Generalized anxiety disorder: Secondary | ICD-10-CM

## 2023-09-03 MED ORDER — DICYCLOMINE HCL 10 MG PO CAPS
10.0000 mg | ORAL_CAPSULE | Freq: Three times a day (TID) | ORAL | 0 refills | Status: AC
  Filled 2023-09-03 – 2023-09-23 (×2): qty 120, 30d supply, fill #0

## 2023-09-03 MED ORDER — METRONIDAZOLE 500 MG PO TABS
500.0000 mg | ORAL_TABLET | Freq: Two times a day (BID) | ORAL | 0 refills | Status: DC
  Filled 2023-09-03: qty 14, 7d supply, fill #0

## 2023-09-03 MED ORDER — CIPROFLOXACIN HCL 500 MG PO TABS
500.0000 mg | ORAL_TABLET | Freq: Two times a day (BID) | ORAL | 0 refills | Status: AC
  Filled 2023-09-03: qty 6, 3d supply, fill #0

## 2023-09-03 NOTE — Progress Notes (Unsigned)
 Subjective:  Patient ID: Lindsay Guerrero, female    DOB: [birth year]  Age: 68 y.o. MRN: [id number]  No chief complaint on file.   HPI:  Patient seen at [facility] for diarrhea.     07/18/2023    9:59 AM 04/18/2023    7:58 AM 11/26/2022   10:40 AM 07/24/2022   10:57 AM  Depression screen PHQ 2/9  Decreased Interest 0 0 1 0  Down, Depressed, Hopeless 1 1 0 1  PHQ - 2 Score 1 1 1 1   Altered sleeping 1 2 2 3   Tired, decreased energy 0 0 1 3  Change in appetite 0 0 0 0  Feeling bad or failure about yourself  0 0 0 0  Trouble concentrating 1 0 0 0  Moving slowly or fidgety/restless 0 0 0 0  Suicidal thoughts 0 0 0 0  PHQ-9 Score 3 3 4 7   Difficult doing work/chores Somewhat difficult Not difficult at all Not difficult at all Somewhat difficult        04/18/2023    7:58 AM  Fall Risk   Falls in the past year? 1  Number falls in past yr: 0  Injury with Fall? 0  Risk for fall due to : No Fall Risks    Patient Care Team: Sherre Clapper, MD as PCP - General (Family Medicine) Bernie Lamar PARAS, MD as PCP - Cardiology (Cardiology) Inocencio Soyla Lunger, MD as PCP - Electrophysiology (Cardiology) Maryl Lynwood Raddle., OD (Optometry)   Review of Systems  Constitutional:  Negative for chills, fatigue and fever.  Respiratory:  Negative for cough and shortness of breath.   Cardiovascular:  Negative for chest pain.  Gastrointestinal:  Negative for abdominal pain, constipation, diarrhea, nausea and vomiting.    Current Outpatient Medications on File Prior to Visit  Medication Sig Dispense Refill   acebutolol  (SECTRAL ) 200 MG capsule Take 1 capsule (200 mg total) by mouth daily. 90 capsule 3   acetaminophen  (TYLENOL ) 500 MG tablet Take 500-1,000 mg by mouth every 6 (six) hours as needed for moderate pain.     Acetylcysteine  (NAC) 600 MG CAPS Take 600-1,200 mg by mouth daily.     amphetamine -dextroamphetamine  (ADDERALL) 20 MG tablet Take 1 tablet (20 mg total) by mouth daily. 30 tablet 0    dapagliflozin  propanediol (FARXIGA ) 10 MG TABS tablet Take 1 tablet (10 mg total) by mouth daily before breakfast. 90 tablet 3   diltiazem  (CARDIZEM ) 30 MG tablet Take 1 tablet (30 mg total) by mouth every 8 (eight) hours as needed (palpations). 60 tablet 1   diphenhydrAMINE  (BENADRYL ) 25 MG tablet Take 25 mg by mouth daily as needed for itching.     fluticasone  (FLONASE ) 50 MCG/ACT nasal spray Place 2 sprays into both nostrils daily. 16 g 6   furosemide  (LASIX ) 20 MG tablet Take 1 tablet (20 mg total) by mouth daily. 90 tablet 1   LORazepam  (ATIVAN ) 1 MG tablet Take 1 tablet (1 mg total) by mouth every 8 (eight) hours as needed for anxiety or sleep. 90 tablet 2   Melatonin 10 MG TABS Take 3 mg by mouth at bedtime as needed (sleep).     omeprazole (PRILOSEC OTC) 20 MG tablet Take 20 mg by mouth daily as needed (acid reflux).     ondansetron  (ZOFRAN ) 4 MG tablet Take 1 tablet (4 mg total) by mouth every 8 (eight) hours as needed for nausea or vomiting. 30 tablet 0   sodium chloride  (OCEAN) 0.65 % SOLN nasal  spray Place 1 spray into both nostrils 2 (two) times daily.     triamcinolone cream (KENALOG) 0.1 % Apply 1 Application topically 2 (two) times daily as needed (psoriasis).     venlafaxine  XR (EFFEXOR  XR) 75 MG 24 hr capsule Take 1 capsule (75 mg total) by mouth daily with breakfast. 90 capsule 0   Vitamin D , Ergocalciferol , (DRISDOL ) 1.25 MG (50000 UNIT) CAPS capsule Take 1 capsule (50,000 Units total) by mouth every Monday. 5 capsule 2   No current facility-administered medications on file prior to visit.   Past Medical History:  Diagnosis Date   Adult attention deficit disorder 05/15/2013   Anxiety state 11/19/2012   ICD10   Aortic stenosis 12/13/2017   Moderate by echocardiogram from 2019 mean gradient 25   Aortic stenosis, mild 07/13/2016   Moderate by echocardiogram from 2019 mean gradient 25   Aortic stenosis, severe 07/02/2019   Asthma    mild   CHF (congestive heart failure)  (HCC)    Chronic bilateral low back pain 01/27/2015   Depression    Diabetes mellitus without complication (HCC)    borderline   Dyspnea    on exertion   Dyspnea on exertion 11/01/2017   Elective surgery for purposes other than treating health conditions 09/27/2010   ICD10   Essential hypertension 06/30/2018   Family history of colon cancer 03/01/2005   Family history of premature coronary artery disease 03/01/2005   GAD (generalized anxiety disorder) 03/01/2005   Formatting of this note might be different from the original. with panic attacks   Hepatitis    due to cytomegalovirus-resolved   History of breast cancer 06/30/2018   History of hiatal hernia    mild,found on a CT   History of malignant neoplasm of breast 03/01/2005   Formatting of this note might be different from the original. node neg; see chartnotes for treatment   HTN (hypertension), benign 07/13/2016   Hyperlipidemia 06/30/2018   Hypokalemia 06/30/2018   Inflamed seborrheic keratosis 08/08/2012   Irritable bowel syndrome 03/01/2005   Malignant neoplasm of upper-inner quadrant of female breast (HCC) 2003   Primary  diagnosis   Mild intermittent asthma without complication 03/01/2005   Formatting of this note might be different from the original. mild   Mitral regurgitation 12/13/2017   Mild to moderate based on echo from 2019   Mixed hyperlipidemia 07/20/2019   NICM (nonischemic cardiomyopathy) (HCC) 11/01/2015   OSA (obstructive sleep apnea)    mild obstructive sleep apnea with an AHI of 10.9/h and O2 saturations as low as 81%.   Osteopenia 01/27/2015   Paroxysmal atrial fibrillation (HCC) 07/14/2019   Pneumonia 2018   Post menopausal problems 01/27/2015   Precordial pain 11/01/2015   Psoriatic arthritis (HCC) 04/25/2015   Recurrent genital HSV (herpes simplex virus) infection 03/01/2005   Formatting of this note might be different from the original. HSV genital   Restless legs syndrome 03/01/2005    Rosacea 03/01/2005   Routine history and physical examination of adult 09/22/2012   Formatting of this note might be different from the original. PROBLEM LIST:  1. Left node negative breast cancer 2002, stage I, grade 1, status post  lumpectomy and node dissection, external beam radiation,  chemotherapy/Cytoxan, mild left arm lymphedema with cellulitis 2004.  2. Anxiety disorder with panic attacks, family history of same, globus  pharyngeus, palpitations, atypical chest pain, str   S/P aortic valve replacement with bioprosthetic valve 21 mm Edwards INSPIRIS RESILIA  07/02/2019   Size 21  mm   Seborrheic keratoses 09/10/2016   Vaginal dryness 01/27/2015   Past Surgical History:  Procedure Laterality Date   AORTIC VALVE REPLACEMENT N/A 07/02/2019   Procedure: AORTIC VALVE REPLACEMENT (AVR) USING INSPIRIS VALVE SIZE ;  Surgeon: Lucas Dorise POUR, MD;  Location: [facility] OR;  Service: Open Heart Surgery;  Laterality: N/A;   ATRIAL FIBRILLATION ABLATION N/A 07/05/2022   Procedure: ATRIAL FIBRILLATION ABLATION;  Surgeon: Inocencio Soyla Lunger, MD;  Location: [facility] INVASIVE CV LAB;  Service: Cardiovascular;  Laterality: N/A;   BREAST LUMPECTOMY Left 2003   sentinal node bx.   CARDIOVERSION N/A 07/10/2019   Procedure: CARDIOVERSION;  Surgeon: Kate Lonni CROME, MD;  Location: [facility] ENDOSCOPY;  Service: Cardiovascular;  Laterality: N/A;   CESAREAN SECTION     CLIPPING OF ATRIAL APPENDAGE N/A 07/02/2019   Procedure: CLIPPING OF ATRIAL APPENDAGE USING PRO2 CLIP SIZE ;  Surgeon: Lucas Dorise POUR, MD;  Location: [facility] OR;  Service: Open Heart Surgery;  Laterality: N/A;   COSMETIC SURGERY     IR THORACENTESIS ASP PLEURAL SPACE W/IMG GUIDE  07/07/2019   RIGHT/LEFT HEART CATH AND CORONARY ANGIOGRAPHY N/A 05/20/2019   Procedure: RIGHT/LEFT HEART CATH AND CORONARY ANGIOGRAPHY;  Surgeon: Wonda Sharper, MD;  Location: [facility] INVASIVE CV LAB;  Service: Cardiovascular;  Laterality: N/A;   TEE WITHOUT CARDIOVERSION N/A  07/02/2019   Procedure: TRANSESOPHAGEAL ECHOCARDIOGRAM (TEE);  Surgeon: Lucas Dorise POUR, MD;  Location: [facility] OR;  Service: Open Heart Surgery;  Laterality: N/A;   TONSILLECTOMY      Family History  Problem Relation Age of Onset   Heart Problems Mother    Hypertension Mother    Heart disease Mother    Heart Problems Father    Heart disease Father    Heart disease Sister    Heart attack Sister    Blindness Daughter    Diabetes Daughter    Developmental delay Daughter    Colon cancer Maternal Grandmother    Heart disease Maternal Grandmother    AAA (abdominal aortic aneurysm) Paternal Grandfather    Social History   Socioeconomic History   Marital status: Married    Spouse name: Not on file   Number of children: 5   Years of education: Not on file   Highest education level: Associate degree: academic program  Occupational History   Occupation: retired  Tobacco Use   Smoking status: Never   Smokeless tobacco: Never  Vaping Use   Vaping status: Never Used  Substance and Sexual Activity   Alcohol use: Yes    Comment: occasional social drinking (once per month)    Drug use: Yes    Types: Marijuana    Comment: smoked college years, chew THC gummies   Sexual activity: Yes    Partners: Male    Birth control/protection: None  Other Topics Concern   Not on file  Social History Narrative   Not on file   Social Drivers of Health   Financial Resource Strain: Low Risk  (04/02/2023)   Overall Financial Resource Strain (CARDIA)    Difficulty of Paying Living Expenses: Not hard at all  Food Insecurity: No Food Insecurity (04/02/2023)   Hunger Vital Sign    Worried About Running Out of Food in the Last Year: Never true    Ran Out of Food in the Last Year: Never true  Transportation Needs: No Transportation Needs (04/02/2023)   PRAPARE - Administrator, Civil Service (Medical): No    Lack of Transportation (Non-Medical): No  Physical Activity: Sufficiently Active  (04/02/2023)   Exercise Vital Sign    Days of Exercise per Week: 6 days    Minutes of Exercise per Session: 40 min  Stress: Stress Concern Present (04/02/2023)   [employer] of Occupational Health - Occupational Stress Questionnaire    Feeling of Stress : Rather much  Social Connections: Socially Integrated (04/02/2023)   Social Connection and Isolation Panel    Frequency of Communication with Friends and Family: More than three times a week    Frequency of Social Gatherings with Friends and Family: Three times a week    Attends Religious Services: 1 to 4 times per year    Active Member of Clubs or Organizations: Yes    Attends Banker Meetings: More than 4 times per year    Marital Status: Married    Objective:  LMP  (LMP Unknown)      07/18/2023    9:53 AM 07/12/2023    8:08 AM 07/02/2023    7:57 PM  BP/Weight  Systolic BP 104 134   Diastolic BP 62 76   Wt. (Lbs) 134 136.8 135  BMI 25.32 kg/m2 25.85 kg/m2 25.51 kg/m2    Physical Exam  {Perform Simple Foot Exam  Perform Detailed exam:1} {Insert foot Exam (Optional):30965}   Lab Results  Component Value Date   WBC 5.3 07/18/2023   HGB 13.1 07/18/2023   HCT 40.7 07/18/2023   PLT 294 07/18/2023   GLUCOSE 127 (H) 07/18/2023   CHOL 242 (H) 07/18/2023   TRIG 91 07/18/2023   HDL 54 07/18/2023   LDLDIRECT 166 (H) 09/15/2020   LDLCALC 172 (H) 07/18/2023   ALT 12 07/18/2023   AST 15 07/18/2023   NA 140 07/18/2023   K 4.1 07/18/2023   CL 99 07/18/2023   CREATININE 0.96 07/18/2023   BUN 20 07/18/2023   CO2 25 07/18/2023   TSH 2.030 11/26/2022   INR 1.6 (H) 07/02/2019   HGBA1C 6.8 (H) 07/18/2023      Assessment & Plan:  There are no diagnoses linked to this encounter.   No orders of the defined types were placed in this encounter.   No orders of the defined types were placed in this encounter.    Follow-up: No follow-ups on file.   I,Katherina A Bramblett,acting as a scribe for Abigail Free,  MD.,have documented all relevant documentation on the behalf of Abigail Free, MD,as directed by  Abigail Free, MD while in the presence of Abigail Free, MD.   An After Visit Summary was printed and given to the patient.  Abigail Free, MD [facility] [phone]

## 2023-09-04 ENCOUNTER — Other Ambulatory Visit (HOSPITAL_BASED_OUTPATIENT_CLINIC_OR_DEPARTMENT_OTHER): Payer: Self-pay

## 2023-09-05 ENCOUNTER — Encounter: Payer: Self-pay | Admitting: Family Medicine

## 2023-09-05 ENCOUNTER — Other Ambulatory Visit (HOSPITAL_BASED_OUTPATIENT_CLINIC_OR_DEPARTMENT_OTHER): Payer: Self-pay

## 2023-09-05 DIAGNOSIS — K5790 Diverticulosis of intestine, part unspecified, without perforation or abscess without bleeding: Secondary | ICD-10-CM | POA: Insufficient documentation

## 2023-09-05 MED ORDER — VORTIOXETINE HBR 10 MG PO TABS
10.0000 mg | ORAL_TABLET | Freq: Every day | ORAL | 0 refills | Status: DC
  Filled 2023-09-05: qty 90, 90d supply, fill #0

## 2023-09-05 NOTE — Assessment & Plan Note (Signed)
 Cipro and Flagyl given.  Stop Augmentin. Refer to GI.

## 2023-09-05 NOTE — Assessment & Plan Note (Signed)
 Continue Trintellix  5 mg daily.  Samples given and will send 10 mg daily and patient may cut in half.

## 2023-09-05 NOTE — Assessment & Plan Note (Signed)
 Start on dicyclomine 10 mg before every meal and nightly as needed for abdominal pain and diarrhea.

## 2023-09-05 NOTE — Assessment & Plan Note (Signed)
 Refer to gastroenterology. Stop Augmentin as it may be making the diarrhea worse. Started on Cipro and Flagyl.

## 2023-09-05 NOTE — Assessment & Plan Note (Signed)
 Patient has a sudden urge and then has difficulty making it to the restroom.  She has had full incontinence sporadically asked. Refer to GI

## 2023-09-17 ENCOUNTER — Other Ambulatory Visit (HOSPITAL_BASED_OUTPATIENT_CLINIC_OR_DEPARTMENT_OTHER): Payer: Self-pay

## 2023-09-18 ENCOUNTER — Other Ambulatory Visit: Payer: Self-pay | Admitting: Family Medicine

## 2023-09-19 ENCOUNTER — Other Ambulatory Visit (HOSPITAL_BASED_OUTPATIENT_CLINIC_OR_DEPARTMENT_OTHER): Payer: Self-pay

## 2023-09-19 DIAGNOSIS — K080 Exfoliation of teeth due to systemic causes: Secondary | ICD-10-CM | POA: Diagnosis not present

## 2023-09-19 MED ORDER — ONDANSETRON HCL 4 MG PO TABS
4.0000 mg | ORAL_TABLET | Freq: Three times a day (TID) | ORAL | 0 refills | Status: DC | PRN
  Filled 2023-09-19: qty 30, 10d supply, fill #0

## 2023-09-19 MED ORDER — AMPHETAMINE-DEXTROAMPHETAMINE 20 MG PO TABS
20.0000 mg | ORAL_TABLET | Freq: Every day | ORAL | 0 refills | Status: DC
Start: 2023-09-19 — End: 2023-10-21
  Filled 2023-09-19: qty 30, 30d supply, fill #0

## 2023-09-23 ENCOUNTER — Other Ambulatory Visit (HOSPITAL_BASED_OUTPATIENT_CLINIC_OR_DEPARTMENT_OTHER): Payer: Self-pay

## 2023-09-25 ENCOUNTER — Other Ambulatory Visit (HOSPITAL_BASED_OUTPATIENT_CLINIC_OR_DEPARTMENT_OTHER): Payer: Self-pay

## 2023-09-28 DIAGNOSIS — G4733 Obstructive sleep apnea (adult) (pediatric): Secondary | ICD-10-CM | POA: Diagnosis not present

## 2023-10-11 ENCOUNTER — Other Ambulatory Visit (HOSPITAL_BASED_OUTPATIENT_CLINIC_OR_DEPARTMENT_OTHER): Payer: Self-pay

## 2023-10-11 ENCOUNTER — Encounter: Payer: Self-pay | Admitting: Family Medicine

## 2023-10-11 ENCOUNTER — Other Ambulatory Visit: Payer: Self-pay

## 2023-10-11 ENCOUNTER — Other Ambulatory Visit: Payer: Self-pay | Admitting: Family Medicine

## 2023-10-11 MED ORDER — ACYCLOVIR 400 MG PO TABS
400.0000 mg | ORAL_TABLET | Freq: Every day | ORAL | 2 refills | Status: AC
  Filled 2023-10-11: qty 35, 7d supply, fill #0
  Filled 2024-01-01: qty 23, 5d supply, fill #1
  Filled 2024-01-01: qty 12, 2d supply, fill #1
  Filled 2024-01-01: qty 23, 5d supply, fill #1
  Filled 2024-01-01: qty 12, 2d supply, fill #1

## 2023-10-21 ENCOUNTER — Other Ambulatory Visit: Payer: Self-pay | Admitting: Family Medicine

## 2023-10-22 ENCOUNTER — Other Ambulatory Visit (HOSPITAL_BASED_OUTPATIENT_CLINIC_OR_DEPARTMENT_OTHER): Payer: Self-pay

## 2023-10-22 ENCOUNTER — Ambulatory Visit: Payer: Self-pay | Admitting: Family Medicine

## 2023-10-22 ENCOUNTER — Encounter: Payer: Self-pay | Admitting: Family Medicine

## 2023-10-22 ENCOUNTER — Ambulatory Visit: Admitting: Family Medicine

## 2023-10-22 VITALS — BP 122/74 | HR 69 | Temp 97.8°F | Resp 16 | Ht 61.0 in | Wt 137.0 lb

## 2023-10-22 DIAGNOSIS — I480 Paroxysmal atrial fibrillation: Secondary | ICD-10-CM | POA: Diagnosis not present

## 2023-10-22 DIAGNOSIS — E1169 Type 2 diabetes mellitus with other specified complication: Secondary | ICD-10-CM | POA: Diagnosis not present

## 2023-10-22 DIAGNOSIS — F988 Other specified behavioral and emotional disorders with onset usually occurring in childhood and adolescence: Secondary | ICD-10-CM | POA: Diagnosis not present

## 2023-10-22 DIAGNOSIS — E782 Mixed hyperlipidemia: Secondary | ICD-10-CM

## 2023-10-22 DIAGNOSIS — I10 Essential (primary) hypertension: Secondary | ICD-10-CM | POA: Diagnosis not present

## 2023-10-22 LAB — POCT LIPID PANEL
HDL: 49
LDL: 203
Non-HDL: 230
TC/HDL: 4.1
TC: 280
TRG: 136

## 2023-10-22 LAB — POCT GLYCOSYLATED HEMOGLOBIN (HGB A1C)
HbA1c POC (<> result, manual entry): 6.4 % (ref 4.0–5.6)
HbA1c, POC (controlled diabetic range): 6.4 % (ref 0.0–7.0)
HbA1c, POC (prediabetic range): 6.4 % (ref 5.7–6.4)
Hemoglobin A1C: 6.4 % — AB (ref 4.0–5.6)

## 2023-10-22 MED ORDER — AMPHETAMINE-DEXTROAMPHETAMINE 20 MG PO TABS
20.0000 mg | ORAL_TABLET | Freq: Every day | ORAL | 0 refills | Status: DC
Start: 2023-10-22 — End: 2024-01-01
  Filled 2023-10-22 – 2023-10-23 (×2): qty 30, 30d supply, fill #0

## 2023-10-22 MED ORDER — ACEBUTOLOL HCL 200 MG PO CAPS
200.0000 mg | ORAL_CAPSULE | Freq: Every day | ORAL | 1 refills | Status: DC
  Filled 2023-10-22: qty 90, 90d supply, fill #0
  Filled 2024-01-15: qty 90, 90d supply, fill #1

## 2023-10-22 MED ORDER — FLUCONAZOLE 150 MG PO TABS
150.0000 mg | ORAL_TABLET | Freq: Every day | ORAL | 0 refills | Status: DC
  Filled 2023-10-22: qty 2, 2d supply, fill #0

## 2023-10-22 MED ORDER — ONDANSETRON HCL 4 MG PO TABS
4.0000 mg | ORAL_TABLET | Freq: Three times a day (TID) | ORAL | 0 refills | Status: DC | PRN
  Filled 2023-10-22: qty 30, 10d supply, fill #0

## 2023-10-22 MED ORDER — BIMATOPROST 0.03 % EX SOLN
1.0000 | Freq: Every day | CUTANEOUS | 12 refills | Status: AC
  Filled 2023-10-22: qty 5, 50d supply, fill #0

## 2023-10-22 NOTE — Progress Notes (Signed)
 Subjective:  Patient ID: Lindsay Guerrero, female    DOB: November 25, 1955  Age: 68 y.o. MRN: [id number]  Chief Complaint  Patient presents with   Medical Management of Chronic Issues    42M     Discussed the use of AI scribe software for clinical note transcription with the patient, who gave verbal consent to proceed.  History of Present Illness   Lindsay Guerrero Jan is a 68 year old female who presents for a three-month follow-up visit.  Altered bowel habits - Alternating episodes of diarrhea and constipation - Diarrhea persisted for over a month, causing anxiety about incontinence - Normal bowel movements are followed by days without any stool - Experiences urgency and difficulty reaching the restroom in time - Probiotic use has been inconsistent - Dicyclomine  provided no relief - Course of antibiotics (Augmentin, Cipro , metronidazole ) did not significantly improve symptoms - History of diverticulitis  Cardiac arrhythmia and hypertension - History of atrial fibrillation, managed with acebutolol  - Underwent ablation for atrial fibrillation - No episodes of atrial fibrillation since ablation, except for one in the past six months - Hypertension present  Hyperlipidemia and weight management - Manages high cholesterol through diet and exercise - Weight loss since May  Diabetes mellitus - Last hemoglobin A1c was 6.8 in May - Occasionally checks blood sugar  Peripheral neuropathy symptoms - Numbness in toes - Sensation of sock being bunched up - Occasional tenderness in feet  Constitutional and respiratory symptoms - No recent fevers, chills, or sweats - No earache, sore throat, stuffy nose, chest pain, or breathing problems         10/22/2023    8:41 AM 07/18/2023    9:59 AM 04/18/2023    7:58 AM 11/26/2022   10:40 AM 07/24/2022   10:57 AM  Depression screen PHQ 2/9  Decreased Interest 0 0 0 1 0  Down, Depressed, Hopeless 0 1 1 0 1  PHQ - 2 Score 0 1 1 1 1   Altered  sleeping 2 1 2 2 3   Tired, decreased energy 1 0 0 1 3  Change in appetite 0 0 0 0 0  Feeling bad or failure about yourself  0 0 0 0 0  Trouble concentrating 0 1 0 0 0  Moving slowly or fidgety/restless 0 0 0 0 0  Suicidal thoughts 0 0 0 0 0  PHQ-9 Score 3 3 3 4 7   Difficult doing work/chores  Somewhat difficult Not difficult at all Not difficult at all Somewhat difficult        04/18/2023    7:58 AM  Fall Risk   Falls in the past year? 1  Number falls in past yr: 0  Injury with Fall? 0  Risk for fall due to : No Fall Risks    Patient Care Team: Sherre Clapper, MD as PCP - General (Family Medicine) Bernie Lamar PARAS, MD as PCP - Cardiology (Cardiology) Inocencio Soyla Lunger, MD as PCP - Electrophysiology (Cardiology) Maryl Lynwood Raddle., OD [facility])   Current Outpatient Medications on File Prior to Visit  Medication Sig Dispense Refill   acetaminophen  (TYLENOL ) 500 MG tablet Take 500-1,000 mg by mouth every 6 (six) hours as needed for moderate pain.     Acetylcysteine  (NAC) 600 MG CAPS Take 600-1,200 mg by mouth daily.     acyclovir  (ZOVIRAX ) 400 MG tablet Take 1 tablet (400 mg total) by mouth 5 (five) times daily. 35 tablet 2   amphetamine -dextroamphetamine  (ADDERALL) 20 MG tablet Take 1 tablet (  20 mg total) by mouth daily. 30 tablet 0   dapagliflozin  propanediol (FARXIGA ) 10 MG TABS tablet Take 1 tablet (10 mg total) by mouth daily before breakfast. 90 tablet 3   dicyclomine  (BENTYL ) 10 MG capsule Take 1 capsule (10 mg total) by mouth 4 (four) times daily -  before meals and at bedtime as needed for spasms/bloating 120 capsule 0   diltiazem  (CARDIZEM ) 30 MG tablet Take 1 tablet (30 mg total) by mouth every 8 (eight) hours as needed (palpations). 60 tablet 1   diphenhydrAMINE  (BENADRYL ) 25 MG tablet Take 25 mg by mouth daily as needed for itching.     fluticasone  (FLONASE ) 50 MCG/ACT nasal spray Place 2 sprays into both nostrils daily. 16 g 6   furosemide  (LASIX ) 20 MG tablet  Take 1 tablet (20 mg total) by mouth daily. 90 tablet 1   LORazepam  (ATIVAN ) 1 MG tablet Take 1 tablet (1 mg total) by mouth every 8 (eight) hours as needed for anxiety or sleep. 90 tablet 2   Melatonin 10 MG TABS Take 3 mg by mouth at bedtime as needed (sleep).     metroNIDAZOLE  (FLAGYL ) 500 MG tablet Take 1 tablet (500 mg total) by mouth 2 (two) times daily. 14 tablet 0   omeprazole (PRILOSEC OTC) 20 MG tablet Take 20 mg by mouth daily as needed (acid reflux).     ondansetron  (ZOFRAN ) 4 MG tablet Take 1 tablet (4 mg total) by mouth every 8 (eight) hours as needed for nausea or vomiting. 30 tablet 0   sodium chloride  (OCEAN) 0.65 % SOLN nasal spray Place 1 spray into both nostrils 2 (two) times daily.     triamcinolone cream (KENALOG) 0.1 % Apply 1 Application topically 2 (two) times daily as needed (psoriasis).     Vitamin D , Ergocalciferol , (DRISDOL ) 1.25 MG (50000 UNIT) CAPS capsule Take 1 capsule (50,000 Units total) by mouth every Monday. 5 capsule 2   No current facility-administered medications on file prior to visit.   Past Medical History:  Diagnosis Date   Adult attention deficit disorder 05/15/2013   Anxiety state 11/19/2012   ICD10   Aortic stenosis 12/13/2017   Moderate by echocardiogram from 2019 mean gradient 25   Aortic stenosis, mild 07/13/2016   Moderate by echocardiogram from 2019 mean gradient 25   Aortic stenosis, severe 07/02/2019   Asthma    mild   CHF (congestive heart failure) (HCC)    Chronic bilateral low back pain 01/27/2015   Depression    Diabetes mellitus without complication (HCC)    borderline   Dyspnea    on exertion   Dyspnea on exertion 11/01/2017   Elective surgery for purposes other than treating health conditions 09/27/2010   ICD10   Essential hypertension 06/30/2018   Family history of colon cancer 03/01/2005   Family history of premature coronary artery disease 03/01/2005   GAD (generalized anxiety disorder) 03/01/2005   Formatting of  this note might be different from the original. with panic attacks   Hepatitis    due to cytomegalovirus-resolved   History of breast cancer 06/30/2018   History of hiatal hernia    mild,found on a CT   History of malignant neoplasm of breast 03/01/2005   Formatting of this note might be different from the original. node neg; see chartnotes for treatment   HTN (hypertension), benign 07/13/2016   Hyperlipidemia 06/30/2018   Hypokalemia 06/30/2018   Inflamed seborrheic keratosis 08/08/2012   Irritable bowel syndrome 03/01/2005   Malignant neoplasm  of upper-inner quadrant of female breast (HCC) 2003   Primary  diagnosis   Mild intermittent asthma without complication 03/01/2005   Formatting of this note might be different from the original. mild   Mitral regurgitation 12/13/2017   Mild to moderate based on echo from 2019   Mixed hyperlipidemia 07/20/2019   NICM (nonischemic cardiomyopathy) (HCC) 11/01/2015   OSA (obstructive sleep apnea)    mild obstructive sleep apnea with an AHI of 10.9/h and O2 saturations as low as 81%.   Osteopenia 01/27/2015   Paroxysmal atrial fibrillation (HCC) 07/14/2019   Pneumonia 2018   Post menopausal problems 01/27/2015   Precordial pain 11/01/2015   Psoriatic arthritis (HCC) 04/25/2015   Recurrent genital HSV (herpes simplex virus) infection 03/01/2005   Formatting of this note might be different from the original. HSV genital   Restless legs syndrome 03/01/2005   Rosacea 03/01/2005   Routine history and physical examination of adult 09/22/2012   Formatting of this note might be different from the original. PROBLEM LIST:  1. Left node negative breast cancer 2002, stage I, grade 1, status post  lumpectomy and node dissection, external beam radiation,  chemotherapy/Cytoxan, mild left arm lymphedema with cellulitis 2004.  2. Anxiety disorder with panic attacks, family history of same, globus  pharyngeus, palpitations, atypical chest pain, str   S/P aortic  valve replacement with bioprosthetic valve 21 mm Edwards INSPIRIS RESILIA  07/02/2019   Size 21 mm   Seborrheic keratoses 09/10/2016   Vaginal dryness 01/27/2015   Past Surgical History:  Procedure Laterality Date   AORTIC VALVE REPLACEMENT N/A 07/02/2019   Procedure: AORTIC VALVE REPLACEMENT (AVR) USING INSPIRIS VALVE SIZE ;  Surgeon: Lucas Dorise POUR, MD;  Location: [facility] OR;  Service: Open Heart Surgery;  Laterality: N/A;   ATRIAL FIBRILLATION ABLATION N/A 07/05/2022   Procedure: ATRIAL FIBRILLATION ABLATION;  Surgeon: Inocencio Soyla Lunger, MD;  Location: [facility] INVASIVE CV LAB;  Service: Cardiovascular;  Laterality: N/A;   BREAST LUMPECTOMY Left 2003   sentinal node bx.   CARDIOVERSION N/A 07/10/2019   Procedure: CARDIOVERSION;  Surgeon: Kate Lonni CROME, MD;  Location: [facility] ENDOSCOPY;  Service: Cardiovascular;  Laterality: N/A;   CESAREAN SECTION     CLIPPING OF ATRIAL APPENDAGE N/A 07/02/2019   Procedure: CLIPPING OF ATRIAL APPENDAGE USING PRO2 CLIP SIZE ;  Surgeon: Lucas Dorise POUR, MD;  Location: [facility] OR;  Service: Open Heart Surgery;  Laterality: N/A;   COSMETIC SURGERY     IR THORACENTESIS ASP PLEURAL SPACE W/IMG GUIDE  07/07/2019   RIGHT/LEFT HEART CATH AND CORONARY ANGIOGRAPHY N/A 05/20/2019   Procedure: RIGHT/LEFT HEART CATH AND CORONARY ANGIOGRAPHY;  Surgeon: Wonda Sharper, MD;  Location: [facility] INVASIVE CV LAB;  Service: Cardiovascular;  Laterality: N/A;   TEE WITHOUT CARDIOVERSION N/A 07/02/2019   Procedure: TRANSESOPHAGEAL ECHOCARDIOGRAM (TEE);  Surgeon: Lucas Dorise POUR, MD;  Location: [facility] OR;  Service: Open Heart Surgery;  Laterality: N/A;   TONSILLECTOMY      Family History  Problem Relation Age of Onset   Heart Problems Mother    Hypertension Mother    Heart disease Mother    Heart Problems Father    Heart disease Father    Heart disease Sister    Heart attack Sister    Blindness Daughter    Diabetes Daughter    Developmental delay Daughter    Colon cancer Maternal  Grandmother    Heart disease Maternal Grandmother    AAA (abdominal aortic aneurysm) Paternal Grandfather    Social History  Socioeconomic History   Marital status: Married    Spouse name: Not on file   Number of children: 5   Years of education: Not on file   Highest education level: Associate degree: academic program  Occupational History   Occupation: retired  Tobacco Use   Smoking status: Never   Smokeless tobacco: Never  Vaping Use   Vaping status: Never Used  Substance and Sexual Activity   Alcohol use: Yes    Comment: occasional social drinking (once per month)    Drug use: Yes    Types: Marijuana    Comment: smoked college years, chew THC gummies   Sexual activity: Yes    Partners: Male    Birth control/protection: None  Other Topics Concern   Not on file  Social History Narrative   Not on file   Social Drivers of Health   Financial Resource Strain: Low Risk  (10/21/2023)   Overall Financial Resource Strain (CARDIA)    Difficulty of Paying Living Expenses: Not hard at all  Food Insecurity: No Food Insecurity (10/21/2023)   Hunger Vital Sign    Worried About Running Out of Food in the Last Year: Never true    Ran Out of Food in the Last Year: Never true  Transportation Needs: No Transportation Needs (10/21/2023)   PRAPARE - Administrator, Civil Service (Medical): No    Lack of Transportation (Non-Medical): No  Physical Activity: Sufficiently Active (10/21/2023)   Exercise Vital Sign    Days of Exercise per Week: 5 days    Minutes of Exercise per Session: 40 min  Stress: Patient Declined (10/21/2023)   [employer] of Occupational Health - Occupational Stress Questionnaire    Feeling of Stress: Patient declined  Social Connections: Unknown (10/21/2023)   Social Connection and Isolation Panel    Frequency of Communication with Friends and Family: More than three times a week    Frequency of Social Gatherings with Friends and Family: More than  three times a week    Attends Religious Services: Patient declined    Database administrator or Organizations: Patient declined    Attends Engineer, structural: Not on file    Marital Status: Married    Objective:  BP 122/74   Pulse 69   Temp 97.8 F (36.6 C)   Resp 16   Ht 5' 1 (1.549 m)   Wt 137 lb (62.1 kg)   LMP  (LMP Unknown)   SpO2 97%   BMI 25.89 kg/m      10/22/2023    8:31 AM 09/03/2023    3:46 PM 07/18/2023    9:53 AM  BP/Weight  Systolic BP 122 116 104  Diastolic BP 74 84 62  Wt. (Lbs) 137 130 134  BMI 25.89 kg/m2 24.56 kg/m2 25.32 kg/m2    Physical Exam Vitals reviewed.  Constitutional:      Appearance: Normal appearance. She is normal weight.  Neck:     Vascular: No carotid bruit.  Cardiovascular:     Rate and Rhythm: Normal rate and regular rhythm.     Heart sounds: Murmur heard.  Pulmonary:     Effort: Pulmonary effort is normal. No respiratory distress.     Breath sounds: Normal breath sounds.  Abdominal:     General: Abdomen is flat. Bowel sounds are normal.     Palpations: Abdomen is soft.     Tenderness: There is abdominal tenderness (very mild LLQ).  Neurological:     Mental  Status: She is alert and oriented to person, place, and time.  Psychiatric:        Mood and Affect: Mood normal.        Behavior: Behavior normal.      Diabetic foot exam was performed with the following findings:   No deformities, ulcerations, or other skin breakdown Normal sensation of 10g monofilament Intact posterior tibialis and dorsalis pedis pulses      Lab Results  Component Value Date   WBC 5.3 07/18/2023   HGB 13.1 07/18/2023   HCT 40.7 07/18/2023   PLT 294 07/18/2023   GLUCOSE 127 (H) 07/18/2023   CHOL 242 (H) 07/18/2023   TRIG 91 07/18/2023   HDL 54 07/18/2023   LDLDIRECT 166 (H) 09/15/2020   LDLCALC 172 (H) 07/18/2023   ALT 12 07/18/2023   AST 15 07/18/2023   NA 140 07/18/2023   K 4.1 07/18/2023   CL 99 07/18/2023   CREATININE  0.96 07/18/2023   BUN 20 07/18/2023   CO2 25 07/18/2023   TSH 2.030 11/26/2022   INR 1.6 (H) 07/02/2019   HGBA1C 6.4 (A) 10/22/2023   HGBA1C 6.4 10/22/2023   HGBA1C 6.4 10/22/2023   HGBA1C 6.4 10/22/2023      Assessment & Plan:  Combined hyperlipidemia associated with type 2 diabetes mellitus (HCC) Assessment & Plan: Type 2 diabetes mellitus with recent A1c of 6.8%. Peripheral neuropathy symptoms likely secondary to diabetes and/or chemotherapy. - Encouraged regular blood glucose monitoring. Hyperlipidemia with high cholesterol levels. Declines statin therapy due to side effect concerns. Discussed alternative non-statin therapies, prefers to defer decision to cardiologist. Recent weight loss may impact cholesterol levels. - Discussed cholesterol management with cardiologist.   Adult attention deficit disorder Assessment & Plan: ADD managed with Adderall, effective. - Continued Adderall as prescribed.   Paroxysmal atrial fibrillation [facility]) Assessment & Plan: Atrial fibrillation status post successful ablation. No episodes since the month following the procedure. - Refilled acebutolol  prescription.   Mixed diabetic hyperlipidemia associated with type 2 diabetes mellitus (HCC) -     POCT glycosylated hemoglobin (Hb A1C) -     POCT Lipid Panel  Essential hypertension, benign Assessment & Plan: Hypertension managed with acebutolol . - Continued acebutolol  as prescribed.   Other orders -     Bimatoprost ; Place one drop on applicator and apply evenly along the skin of the upper eyelid at base of eyelashes once daily at bedtime; repeat procedure for second eye (use a clean applicator).  Dispense: 5 mL; Refill: 12 -     Acebutolol  HCl; Take 1 capsule (200 mg total) by mouth daily.  Dispense: 90 capsule; Refill: 1 -     Fluconazole ; Take 1 tablet (150 mg total) by mouth daily.  Dispense: 2 tablet; Refill: 0   Meds ordered this encounter  Medications   bimatoprost  (LATISSE )  0.03 % ophthalmic solution    Sig: Place one drop on applicator and apply evenly along the skin of the upper eyelid at base of eyelashes once daily at bedtime; repeat procedure for second eye (use a clean applicator).    Dispense:  5 mL    Refill:  12   acebutolol  (SECTRAL ) 200 MG capsule    Sig: Take 1 capsule (200 mg total) by mouth daily.    Dispense:  90 capsule    Refill:  1   fluconazole  (DIFLUCAN ) 150 MG tablet    Sig: Take 1 tablet (150 mg total) by mouth daily.    Dispense:  2 tablet  Refill:  0    Orders Placed This Encounter  Procedures   POCT glycosylated hemoglobin (Hb A1C)   POCT Lipid Panel     Follow-up: Return in about 4 months (around 02/21/2024) for chronic follow up.  An After Visit Summary was printed and given to the patient.  Abigail Free, MD [facility] [phone]

## 2023-10-22 NOTE — Patient Instructions (Signed)
 VISIT SUMMARY:  You had a follow-up visit to discuss several ongoing health issues, including bowel habit changes, diabetes, peripheral neuropathy, atrial fibrillation, hypertension, hyperlipidemia, ADD, depression, and sleep apnea.  YOUR PLAN:  BOWEL HABIT DYSFUNCTION: You have been experiencing alternating episodes of diarrhea and constipation, with recent prolonged diarrhea followed by constipation. Previous antibiotics were ineffective, and probiotic use has been inconsistent. -Use probiotics consistently. -Please call and schedule appointment with your GI specialist.  TYPE 2 DIABETES MELLITUS: Your recent A1c was 6.8%. You have symptoms of peripheral neuropathy likely due to diabetes and/or past chemotherapy.  ATRIAL FIBRILLATION: You have a history of atrial fibrillation and underwent a successful ablation. There have been no episodes since the month following the procedure. -Continue taking acebutolol  as prescribed unless otherwise recommended by cardiology.  HYPERLIPIDEMIA: You have high cholesterol levels and have declined statin therapy due to side effect concerns. We discussed alternative non-statin therapies (zetia.) Your recent weight loss may impact your cholesterol levels. -Discuss cholesterol management with your cardiologist.  ATTENTION DEFICIT DISORDER (ADD): Your ADD is being managed effectively with Adderall. -Continue taking Adderall as prescribed.  DEPRESSION: Your depression is being managed effectively with Trintellix . -Continue taking Trintellix  as prescribed.

## 2023-10-23 ENCOUNTER — Other Ambulatory Visit (HOSPITAL_BASED_OUTPATIENT_CLINIC_OR_DEPARTMENT_OTHER): Payer: Self-pay

## 2023-10-24 ENCOUNTER — Encounter: Payer: Self-pay | Admitting: Family Medicine

## 2023-10-24 NOTE — Assessment & Plan Note (Signed)
 Atrial fibrillation status post successful ablation. No episodes since the month following the procedure. - Refilled acebutolol  prescription.

## 2023-10-24 NOTE — Assessment & Plan Note (Signed)
 Type 2 diabetes mellitus with recent A1c of 6.8%. Peripheral neuropathy symptoms likely secondary to diabetes and/or chemotherapy. - Encouraged regular blood glucose monitoring. Hyperlipidemia with high cholesterol levels. Declines statin therapy due to side effect concerns. Discussed alternative non-statin therapies, prefers to defer decision to cardiologist. Recent weight loss may impact cholesterol levels. - Discussed cholesterol management with cardiologist.

## 2023-10-24 NOTE — Assessment & Plan Note (Signed)
 ADD managed with Adderall, effective. - Continued Adderall as prescribed.

## 2023-10-24 NOTE — Assessment & Plan Note (Signed)
 Hypertension managed with acebutolol . - Continued acebutolol  as prescribed.

## 2023-10-29 ENCOUNTER — Other Ambulatory Visit (HOSPITAL_BASED_OUTPATIENT_CLINIC_OR_DEPARTMENT_OTHER): Payer: Self-pay

## 2023-10-29 DIAGNOSIS — G4733 Obstructive sleep apnea (adult) (pediatric): Secondary | ICD-10-CM | POA: Diagnosis not present

## 2023-10-30 ENCOUNTER — Other Ambulatory Visit (HOSPITAL_BASED_OUTPATIENT_CLINIC_OR_DEPARTMENT_OTHER): Payer: Self-pay

## 2023-11-06 NOTE — Telephone Encounter (Signed)
 Fax came from [employer] about the overnight pulse ox test ordered for the patient, stating the test had been cancelled for the patient because she refused testing.

## 2023-11-16 NOTE — Progress Notes (Unsigned)
  Electrophysiology Office Note:   Date:  11/18/2023  ID:  Lindsay Guerrero, DOB [birth year], MRN [id number]  Primary Cardiologist: Lamar Fitch, MD Primary Heart Failure: None Electrophysiologist: Daimen Shovlin Gladis Norton, MD      History of Present Illness:   Lindsay Guerrero is a 68 y.o. female with h/o aortic stenosis post AVR in 2021, nonobstructive coronary artery disease, hypertension, atrial fibrillation seen today for routine electrophysiology followup.   Since last being seen in our clinic the patient reports doing well.  She has noted no obvious episodes of atrial fibrillation.  She had an episode in the summer where her heart rate went to 200 bpm.  She was able to capture the episode on her Apple watch.  She has otherwise felt well without complaint.  she denies chest pain, palpitations, dyspnea, PND, orthopnea, nausea, vomiting, dizziness, syncope, edema, weight gain, or early satiety.   Review of systems complete and found to be negative unless listed in HPI.   EP Information / Studies Reviewed:    EKG is ordered today. Personal review as below.  EKG Interpretation Date/Time:  Monday November 18 2023 10:49:30 EDT Ventricular Rate:  64 PR Interval:  158 QRS Duration:  84 QT Interval:  410 QTC Calculation: 422 R Axis:   86  Text Interpretation: Normal sinus rhythm Septal infarct (cited on or before 30-Jun-2019) When compared with ECG of 08-Oct-2022 10:33, No significant change was found Confirmed by Apolinar Bero ([id number]) on 11/18/2023 10:52:20 AM    Risk Assessment/Calculations:    CHA2DS2-VASc Score =     This indicates a  % annual risk of stroke. The patient's score is based upon: CHF History: 0 HTN History: 0 Diabetes History: 1 Vascular Disease History: 1 Age Score: 1 Gender Score: 1       STOP-Bang Score:  4       Physical Exam:   VS:  BP 124/70   Pulse 64   Ht 5' 1 (1.549 m)   Wt 124 lb 12.8 oz (56.6 kg)   LMP  (LMP Unknown)   SpO2 96%   BMI 23.58  kg/m    Wt Readings from Last 3 Encounters:  11/18/23 124 lb 12.8 oz (56.6 kg)  10/22/23 137 lb (62.1 kg)  09/03/23 130 lb (59 kg)     GEN: Well nourished, well developed in no acute distress NECK: No JVD; No carotid bruits CARDIAC: Regular rate and rhythm, no murmurs, rubs, gallops RESPIRATORY:  Clear to auscultation without rales, wheezing or rhonchi  ABDOMEN: Soft, non-tender, non-distended EXTREMITIES:  No edema; No deformity   ASSESSMENT AND PLAN:    1.  Persistent atrial fibrillation: Post ablation 05/22/2022.  Has had 1 further episode of atrial fibrillation since her ablation.  She is happy with her control.  Nalu Troublefield continue current management.  2.  Secondary hypercoagulable state: On Eliquis   3.  Aortic stenosis: Post AVR.  Plan per primary cardiology  4.  Hypertension: Well-controlled  Follow up with Dr. Norton as needed   Signed, Anson Peddie Gladis Norton, MD

## 2023-11-18 ENCOUNTER — Other Ambulatory Visit (HOSPITAL_BASED_OUTPATIENT_CLINIC_OR_DEPARTMENT_OTHER): Payer: Self-pay

## 2023-11-18 ENCOUNTER — Ambulatory Visit: Attending: Cardiology | Admitting: Cardiology

## 2023-11-18 ENCOUNTER — Encounter: Payer: Self-pay | Admitting: Cardiology

## 2023-11-18 VITALS — BP 124/70 | HR 64 | Ht 61.0 in | Wt 124.8 lb

## 2023-11-18 DIAGNOSIS — I10 Essential (primary) hypertension: Secondary | ICD-10-CM

## 2023-11-18 DIAGNOSIS — D6869 Other thrombophilia: Secondary | ICD-10-CM | POA: Diagnosis not present

## 2023-11-18 DIAGNOSIS — I4819 Other persistent atrial fibrillation: Secondary | ICD-10-CM

## 2023-11-18 MED ORDER — DILTIAZEM HCL 30 MG PO TABS
30.0000 mg | ORAL_TABLET | Freq: Three times a day (TID) | ORAL | 3 refills | Status: AC | PRN
  Filled 2023-11-18: qty 60, 20d supply, fill #0

## 2023-11-23 DIAGNOSIS — Z7901 Long term (current) use of anticoagulants: Secondary | ICD-10-CM | POA: Diagnosis not present

## 2023-11-23 DIAGNOSIS — Z79899 Other long term (current) drug therapy: Secondary | ICD-10-CM | POA: Diagnosis not present

## 2023-11-23 DIAGNOSIS — R509 Fever, unspecified: Secondary | ICD-10-CM | POA: Diagnosis not present

## 2023-11-23 DIAGNOSIS — Z792 Long term (current) use of antibiotics: Secondary | ICD-10-CM | POA: Diagnosis not present

## 2023-11-23 DIAGNOSIS — U071 COVID-19: Secondary | ICD-10-CM | POA: Diagnosis not present

## 2023-12-09 ENCOUNTER — Encounter: Payer: Self-pay | Admitting: Family Medicine

## 2023-12-09 ENCOUNTER — Other Ambulatory Visit: Payer: Self-pay | Admitting: Family Medicine

## 2023-12-09 MED ORDER — LORAZEPAM 1 MG PO TABS
1.0000 mg | ORAL_TABLET | Freq: Three times a day (TID) | ORAL | 2 refills | Status: AC | PRN
  Filled 2023-12-09: qty 90, 30d supply, fill #0
  Filled 2024-01-24: qty 90, 30d supply, fill #1
  Filled 2024-04-06: qty 90, 30d supply, fill #2

## 2023-12-10 ENCOUNTER — Other Ambulatory Visit (HOSPITAL_BASED_OUTPATIENT_CLINIC_OR_DEPARTMENT_OTHER): Payer: Self-pay

## 2023-12-11 ENCOUNTER — Other Ambulatory Visit: Payer: Self-pay | Admitting: Family Medicine

## 2023-12-11 ENCOUNTER — Other Ambulatory Visit (HOSPITAL_BASED_OUTPATIENT_CLINIC_OR_DEPARTMENT_OTHER): Payer: Self-pay

## 2024-01-01 ENCOUNTER — Other Ambulatory Visit: Payer: Self-pay

## 2024-01-01 ENCOUNTER — Other Ambulatory Visit (HOSPITAL_BASED_OUTPATIENT_CLINIC_OR_DEPARTMENT_OTHER): Payer: Self-pay

## 2024-01-01 ENCOUNTER — Other Ambulatory Visit: Payer: Self-pay | Admitting: Family Medicine

## 2024-01-02 ENCOUNTER — Other Ambulatory Visit (HOSPITAL_BASED_OUTPATIENT_CLINIC_OR_DEPARTMENT_OTHER): Payer: Self-pay

## 2024-01-02 MED ORDER — AMPHETAMINE-DEXTROAMPHETAMINE 20 MG PO TABS
20.0000 mg | ORAL_TABLET | Freq: Every day | ORAL | 0 refills | Status: DC
  Filled 2024-01-02: qty 30, 30d supply, fill #0

## 2024-01-02 MED ORDER — ONDANSETRON HCL 4 MG PO TABS
4.0000 mg | ORAL_TABLET | Freq: Three times a day (TID) | ORAL | 0 refills | Status: DC | PRN
  Filled 2024-01-02: qty 30, 10d supply, fill #0

## 2024-01-02 MED ORDER — FUROSEMIDE 20 MG PO TABS
20.0000 mg | ORAL_TABLET | Freq: Every day | ORAL | 1 refills | Status: AC
  Filled 2024-01-02: qty 90, 90d supply, fill #0
  Filled 2024-04-07: qty 90, 90d supply, fill #1

## 2024-01-08 ENCOUNTER — Ambulatory Visit: Attending: Cardiology | Admitting: Cardiology

## 2024-01-08 ENCOUNTER — Encounter: Payer: Self-pay | Admitting: Cardiology

## 2024-01-08 VITALS — BP 130/80 | HR 65 | Ht 61.0 in | Wt 128.0 lb

## 2024-01-08 DIAGNOSIS — I428 Other cardiomyopathies: Secondary | ICD-10-CM

## 2024-01-08 DIAGNOSIS — I10 Essential (primary) hypertension: Secondary | ICD-10-CM | POA: Diagnosis not present

## 2024-01-08 DIAGNOSIS — R0609 Other forms of dyspnea: Secondary | ICD-10-CM

## 2024-01-08 DIAGNOSIS — I480 Paroxysmal atrial fibrillation: Secondary | ICD-10-CM | POA: Diagnosis not present

## 2024-01-08 DIAGNOSIS — Z953 Presence of xenogenic heart valve: Secondary | ICD-10-CM

## 2024-01-08 NOTE — Progress Notes (Signed)
 Cardiology Office Note:    Date:  01/08/2024   ID:  ROGELIO WINBUSH, DOB [birth year], MRN [id number]  PCP:  Sherre Clapper, MD  Cardiologist:  Lamar Fitch, MD    Referring MD: Sherre Clapper, MD   Chief Complaint  Patient presents with   Follow-up  Doing fine  History of Present Illness:    Lindsay Guerrero is a 68 y.o. female past medical history significant for aortic stenosis, status post aortic valve replacement done in 2021 April with 21 mm Inspira valve, nonobstructive coronary artery disease at the time, additional problem clued essential hypertension, paroxysmal atrial fibrillation, status post atrial fibrillation ablation refused anticoagulation, dyslipidemia refused statin and any therapy for cholesterol.  She is a engineer, civil (consulting).  She understand perfectly potential consequences of this.  Comes today to my office overall doing fair.  She states in the spring time she got COVID and then she got a difficult time recovering from it.  Also some GI issue but overall gradually getting better.  Try to be active try to exercise on regular basis by walking.  Denies have any chest pain tightness squeezing pressure burning chest no palpitations dizziness swelling of lower extremities  Past Medical History:  Diagnosis Date   Adult attention deficit disorder 05/15/2013   Anxiety state 11/19/2012   ICD10   Aortic stenosis 12/13/2017   Moderate by echocardiogram from 2019 mean gradient 25   Aortic stenosis, mild 07/13/2016   Moderate by echocardiogram from 2019 mean gradient 25   Aortic stenosis, severe 07/02/2019   Asthma    mild   CHF (congestive heart failure) (HCC)    Chronic bilateral low back pain 01/27/2015   Depression    Diabetes mellitus without complication (HCC)    borderline   Dyspnea    on exertion   Dyspnea on exertion 11/01/2017   Elective surgery for purposes other than treating health conditions 09/27/2010   ICD10   Essential hypertension 06/30/2018   Family history of  colon cancer 03/01/2005   Family history of premature coronary artery disease 03/01/2005   GAD (generalized anxiety disorder) 03/01/2005   Formatting of this note might be different from the original. with panic attacks   Hepatitis    due to cytomegalovirus-resolved   History of breast cancer 06/30/2018   History of hiatal hernia    mild,found on a CT   History of malignant neoplasm of breast 03/01/2005   Formatting of this note might be different from the original. node neg; see chartnotes for treatment   HTN (hypertension), benign 07/13/2016   Hyperlipidemia 06/30/2018   Hypokalemia 06/30/2018   Inflamed seborrheic keratosis 08/08/2012   Irritable bowel syndrome 03/01/2005   Malignant neoplasm of upper-inner quadrant of female breast (HCC) 2003   Primary  diagnosis   Mild intermittent asthma without complication 03/01/2005   Formatting of this note might be different from the original. mild   Mitral regurgitation 12/13/2017   Mild to moderate based on echo from 2019   Mixed hyperlipidemia 07/20/2019   NICM (nonischemic cardiomyopathy) (HCC) 11/01/2015   OSA (obstructive sleep apnea)    mild obstructive sleep apnea with an AHI of 10.9/h and O2 saturations as low as 81%.   Osteopenia 01/27/2015   Paroxysmal atrial fibrillation (HCC) 07/14/2019   Pneumonia 2018   Post menopausal problems 01/27/2015   Precordial pain 11/01/2015   Psoriatic arthritis (HCC) 04/25/2015   Recurrent genital HSV (herpes simplex virus) infection 03/01/2005   Formatting of this note might be different from  the original. HSV genital   Restless legs syndrome 03/01/2005   Rosacea 03/01/2005   Routine history and physical examination of adult 09/22/2012   Formatting of this note might be different from the original. PROBLEM LIST:  1. Left node negative breast cancer 2002, stage I, grade 1, status post  lumpectomy and node dissection, external beam radiation,  chemotherapy/Cytoxan, mild left arm lymphedema with  cellulitis 2004.  2. Anxiety disorder with panic attacks, family history of same, globus  pharyngeus, palpitations, atypical chest pain, str   S/P aortic valve replacement with bioprosthetic valve 21 mm Edwards INSPIRIS RESILIA  07/02/2019   Size 21 mm   Seborrheic keratoses 09/10/2016   Vaginal dryness 01/27/2015    Past Surgical History:  Procedure Laterality Date   AORTIC VALVE REPLACEMENT N/A 07/02/2019   Procedure: AORTIC VALVE REPLACEMENT (AVR) USING INSPIRIS VALVE SIZE ;  Surgeon: Lucas Dorise POUR, MD;  Location: [facility] OR;  Service: Open Heart Surgery;  Laterality: N/A;   ATRIAL FIBRILLATION ABLATION N/A 07/05/2022   Procedure: ATRIAL FIBRILLATION ABLATION;  Surgeon: Inocencio Soyla Lunger, MD;  Location: [facility] INVASIVE CV LAB;  Service: Cardiovascular;  Laterality: N/A;   BREAST LUMPECTOMY Left 2003   sentinal node bx.   CARDIOVERSION N/A 07/10/2019   Procedure: CARDIOVERSION;  Surgeon: Kate Lonni CROME, MD;  Location: [facility] ENDOSCOPY;  Service: Cardiovascular;  Laterality: N/A;   CESAREAN SECTION     CLIPPING OF ATRIAL APPENDAGE N/A 07/02/2019   Procedure: CLIPPING OF ATRIAL APPENDAGE USING PRO2 CLIP SIZE ;  Surgeon: Lucas Dorise POUR, MD;  Location: [facility] OR;  Service: Open Heart Surgery;  Laterality: N/A;   COSMETIC SURGERY     IR THORACENTESIS ASP PLEURAL SPACE W/IMG GUIDE  07/07/2019   RIGHT/LEFT HEART CATH AND CORONARY ANGIOGRAPHY N/A 05/20/2019   Procedure: RIGHT/LEFT HEART CATH AND CORONARY ANGIOGRAPHY;  Surgeon: Wonda Sharper, MD;  Location: [facility] INVASIVE CV LAB;  Service: Cardiovascular;  Laterality: N/A;   TEE WITHOUT CARDIOVERSION N/A 07/02/2019   Procedure: TRANSESOPHAGEAL ECHOCARDIOGRAM (TEE);  Surgeon: Lucas Dorise POUR, MD;  Location: [facility] OR;  Service: Open Heart Surgery;  Laterality: N/A;   TONSILLECTOMY      Current Medications: Current Meds  Medication Sig   acebutolol  (SECTRAL ) 200 MG capsule Take 1 capsule (200 mg total) by mouth daily.   acetaminophen  (TYLENOL ) 500  MG tablet Take 500-1,000 mg by mouth every 6 (six) hours as needed for moderate pain.   Acetylcysteine  (NAC) 600 MG CAPS Take 600-1,200 mg by mouth daily.   acyclovir  (ZOVIRAX ) 400 MG tablet Take 1 tablet (400 mg total) by mouth 5 (five) times daily.   amphetamine -dextroamphetamine  (ADDERALL) 20 MG tablet Take 1 tablet (20 mg total) by mouth daily.   bimatoprost  (LATISSE ) 0.03 % ophthalmic solution Place one drop on applicator and apply evenly along the skin of the upper eyelid at base of eyelashes once daily at bedtime; repeat procedure for second eye (use a clean applicator).   dicyclomine  (BENTYL ) 10 MG capsule Take 1 capsule (10 mg total) by mouth 4 (four) times daily -  before meals and at bedtime as needed for spasms/bloating   diltiazem  (CARDIZEM ) 30 MG tablet Take 1 tablet (30 mg total) by mouth every 8 (eight) hours as needed (palpations).   diphenhydrAMINE  (BENADRYL ) 25 MG tablet Take 25 mg by mouth daily as needed for itching.   fluticasone  (FLONASE ) 50 MCG/ACT nasal spray Place 2 sprays into both nostrils daily.   furosemide  (LASIX ) 20 MG tablet Take 1 tablet (20 mg total)  by mouth daily.   LORazepam  (ATIVAN ) 1 MG tablet Take 1 tablet (1 mg total) by mouth every 8 (eight) hours as needed for anxiety or sleep.   Melatonin 10 MG TABS Take 3 mg by mouth at bedtime as needed (sleep).   omeprazole (PRILOSEC OTC) 20 MG tablet Take 20 mg by mouth daily as needed (acid reflux).   ondansetron  (ZOFRAN ) 4 MG tablet Take 1 tablet (4 mg total) by mouth every 8 (eight) hours as needed for nausea or vomiting.   sodium chloride  (OCEAN) 0.65 % SOLN nasal spray Place 1 spray into both nostrils 2 (two) times daily.   triamcinolone cream (KENALOG) 0.1 % Apply 1 Application topically 2 (two) times daily as needed (psoriasis).   Vitamin D , Ergocalciferol , (DRISDOL ) 1.25 MG (50000 UNIT) CAPS capsule Take 1 capsule (50,000 Units total) by mouth every Monday.     Allergies:   Lisinopril, Prozac [fluoxetine],  and Metoprolol    Social History   Socioeconomic History   Marital status: Married    Spouse name: Not on file   Number of children: 5   Years of education: Not on file   Highest education level: Associate degree: academic program  Occupational History   Occupation: retired  Tobacco Use   Smoking status: Never   Smokeless tobacco: Never  Vaping Use   Vaping status: Never Used  Substance and Sexual Activity   Alcohol use: Yes    Comment: occasional social drinking (once per month)    Drug use: Yes    Types: Marijuana    Comment: smoked college years, chew THC gummies   Sexual activity: Yes    Partners: Male    Birth control/protection: None  Other Topics Concern   Not on file  Social History Narrative   Not on file   Social Drivers of Health   Financial Resource Strain: Low Risk  (10/21/2023)   Overall Financial Resource Strain (CARDIA)    Difficulty of Paying Living Expenses: Not hard at all  Food Insecurity: No Food Insecurity (10/21/2023)   Hunger Vital Sign    Worried About Running Out of Food in the Last Year: Never true    Ran Out of Food in the Last Year: Never true  Transportation Needs: No Transportation Needs (10/21/2023)   PRAPARE - Administrator, Civil Service (Medical): No    Lack of Transportation (Non-Medical): No  Physical Activity: Sufficiently Active (10/21/2023)   Exercise Vital Sign    Days of Exercise per Week: 5 days    Minutes of Exercise per Session: 40 min  Stress: Patient Declined (10/21/2023)   [employer] of Occupational Health - Occupational Stress Questionnaire    Feeling of Stress: Patient declined  Social Connections: Unknown (10/21/2023)   Social Connection and Isolation Panel    Frequency of Communication with Friends and Family: More than three times a week    Frequency of Social Gatherings with Friends and Family: More than three times a week    Attends Religious Services: Patient declined    Database Administrator  or Organizations: Patient declined    Attends Engineer, Structural: Not on file    Marital Status: Married     Family History: The patient's family history includes AAA (abdominal aortic aneurysm) in her paternal grandfather; Blindness in her daughter; Colon cancer in her maternal grandmother; Developmental delay in her daughter; Diabetes in her daughter; Heart Problems in her father and mother; Heart attack in her sister; Heart disease in  her father, maternal grandmother, mother, and sister; Hypertension in her mother. ROS:   Please see the history of present illness.    All 14 point review of systems negative except as described per history of present illness  EKGs/Labs/Other Studies Reviewed:    EKG Interpretation Date/Time:  Wednesday January 08 2024 08:28:44 EDT Ventricular Rate:  65 PR Interval:  172 QRS Duration:  84 QT Interval:  418 QTC Calculation: 434 R Axis:   91  Text Interpretation: Normal sinus rhythm Rightward axis Septal infarct (cited on or before 30-Jun-2019) When compared with ECG of 18-Nov-2023 10:49, No significant change was found Confirmed by Bernie Charleston [phone]) on 01/08/2024 8:33:07 AM    Recent Labs: 07/18/2023: ALT 12; BUN 20; Creatinine, Ser 0.96; Hemoglobin 13.1; Platelets 294; Potassium 4.1; Sodium 140  Recent Lipid Panel    Component Value Date/Time   CHOL 242 (H) 07/18/2023 1041   TRIG 91 07/18/2023 1041   HDL 54 07/18/2023 1041   CHOLHDL 4.5 (H) 07/18/2023 1041   LDLCALC 172 (H) 07/18/2023 1041   LDLDIRECT 166 (H) 09/15/2020 1326    Physical Exam:    VS:  BP 130/80   Pulse 65   Ht 5' 1 (1.549 m)   Wt 128 lb (58.1 kg)   LMP  (LMP Unknown)   SpO2 99%   BMI 24.19 kg/m     Wt Readings from Last 3 Encounters:  01/08/24 128 lb (58.1 kg)  11/18/23 124 lb 12.8 oz (56.6 kg)  10/22/23 137 lb (62.1 kg)     GEN:  Well nourished, well developed in no acute distress HEENT: Normal NECK: No JVD; No carotid bruits LYMPHATICS: No  lymphadenopathy CARDIAC: RRR, no murmurs, no rubs, no gallops RESPIRATORY:  Clear to auscultation without rales, wheezing or rhonchi  ABDOMEN: Soft, non-tender, non-distended MUSCULOSKELETAL:  No edema; No deformity  SKIN: Warm and dry LOWER EXTREMITIES: no swelling NEUROLOGIC:  Alert and oriented x 3 PSYCHIATRIC:  Normal affect   ASSESSMENT:    1. Paroxysmal atrial fibrillation (HCC)   2. Essential hypertension, benign   3. NICM (nonischemic cardiomyopathy) (HCC)   4. S/P aortic valve replacement with bioprosthetic valve 21 mm Edwards INSPIRIS RESILIA     PLAN:    In order of problems listed above:  Paroxysmal atrial fibrillation described to have only 1 episode in the summer otherwise everything looks fine.  Asymptomatic still refusing anticoagulation. Essential hypertension blood pressure well-controlled continue present management. Nonischemic cardiomyopathy last time echocardiogram showed preserved ejection fraction. Dyslipidemia cholesterol absolutely unacceptable again due to discussion about this she does not want to take any cholesterol medication.  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  Concerns regarding medicines are outlined above.  Orders Placed This Encounter  Procedures   EKG 12-Lead   Medication changes: No orders of the defined types were placed in this encounter.   Signed, [place] DOROTHA Bernie, MD, [facility] 01/08/2024 8:49 AM    [facility]

## 2024-01-08 NOTE — Addendum Note (Signed)
 Addended by: ARLOA PLANAS D on: 01/08/2024 08:55 AM   Modules accepted: Orders

## 2024-01-08 NOTE — Patient Instructions (Signed)
 Medication Instructions:  Your physician recommends that you continue on your current medications as directed. Please refer to the Current Medication list given to you today.  *If you need a refill on your cardiac medications before your next appointment, please call your pharmacy*   Lab Work: None Ordered If you have labs (blood work) drawn today and your tests are completely normal, you will receive your results only by: MyChart Message (if you have MyChart) OR A paper copy in the mail If you have any lab test that is abnormal or we need to change your treatment, we will call you to review the results.   Testing/Procedures: 6 month  Your physician has requested that you have an echocardiogram. Echocardiography is a painless test that uses sound waves to create images of your heart. It provides your doctor with information about the size and shape of your heart and how well your heart's chambers and valves are working. This procedure takes approximately one hour. There are no restrictions for this procedure. Please do NOT wear cologne, perfume, aftershave, or lotions (deodorant is allowed). Please arrive 15 minutes prior to your appointment time.  Please note: We ask at that you not bring children with you during ultrasound (echo/ vascular) testing. Due to room size and safety concerns, children are not allowed in the ultrasound rooms during exams. Our front office staff cannot provide observation of children in our lobby area while testing is being conducted. An adult accompanying a patient to their appointment will only be allowed in the ultrasound room at the discretion of the ultrasound technician under special circumstances. We apologize for any inconvenience.    Follow-Up: At [facility], you and your health needs are our priority.  As part of our continuing mission to provide you with exceptional heart care, we have created designated Provider Care Teams.  These Care Teams include  your primary Cardiologist (physician) and Advanced Practice Providers (APPs -  Physician Assistants and Nurse Practitioners) who all work together to provide you with the care you need, when you need it.  We recommend signing up for the patient portal called MyChart.  Sign up information is provided on this After Visit Summary.  MyChart is used to connect with patients for Virtual Visits (Telemedicine).  Patients are able to view lab/test results, encounter notes, upcoming appointments, etc.  Non-urgent messages can be sent to your provider as well.   To learn more about what you can do with MyChart, go to [email or web address].    Your next appointment:   7 month(s)  The format for your next appointment:   In Person  Provider:   Lamar Fitch, MD    Other Instructions NA

## 2024-01-13 ENCOUNTER — Other Ambulatory Visit (HOSPITAL_BASED_OUTPATIENT_CLINIC_OR_DEPARTMENT_OTHER): Payer: Self-pay

## 2024-01-13 ENCOUNTER — Other Ambulatory Visit: Payer: Self-pay | Admitting: Family Medicine

## 2024-01-14 ENCOUNTER — Encounter (HOSPITAL_BASED_OUTPATIENT_CLINIC_OR_DEPARTMENT_OTHER): Payer: Self-pay | Admitting: Pharmacy Technician

## 2024-01-14 ENCOUNTER — Other Ambulatory Visit (HOSPITAL_BASED_OUTPATIENT_CLINIC_OR_DEPARTMENT_OTHER): Payer: Self-pay

## 2024-01-15 ENCOUNTER — Encounter: Payer: Self-pay | Admitting: Family Medicine

## 2024-01-15 ENCOUNTER — Other Ambulatory Visit (HOSPITAL_BASED_OUTPATIENT_CLINIC_OR_DEPARTMENT_OTHER): Payer: Self-pay

## 2024-01-16 ENCOUNTER — Other Ambulatory Visit: Payer: Self-pay | Admitting: Family Medicine

## 2024-01-17 ENCOUNTER — Other Ambulatory Visit: Payer: Self-pay | Admitting: Family Medicine

## 2024-01-17 ENCOUNTER — Other Ambulatory Visit (HOSPITAL_BASED_OUTPATIENT_CLINIC_OR_DEPARTMENT_OTHER): Payer: Self-pay

## 2024-01-17 MED ORDER — VORTIOXETINE HBR 10 MG PO TABS
10.0000 mg | ORAL_TABLET | Freq: Every day | ORAL | 3 refills | Status: AC
  Filled 2024-01-17: qty 30, 30d supply, fill #0
  Filled 2024-03-07: qty 30, 30d supply, fill #1

## 2024-01-27 ENCOUNTER — Other Ambulatory Visit (HOSPITAL_BASED_OUTPATIENT_CLINIC_OR_DEPARTMENT_OTHER): Payer: Self-pay

## 2024-02-09 MED ORDER — VORTIOXETINE HBR 10 MG PO TABS
10.0000 mg | ORAL_TABLET | Freq: Every day | ORAL | 2 refills | Status: AC
  Filled 2024-02-09 – 2024-04-07 (×2): qty 30, 30d supply, fill #0

## 2024-02-10 ENCOUNTER — Other Ambulatory Visit (HOSPITAL_BASED_OUTPATIENT_CLINIC_OR_DEPARTMENT_OTHER): Payer: Self-pay

## 2024-02-20 ENCOUNTER — Other Ambulatory Visit (HOSPITAL_BASED_OUTPATIENT_CLINIC_OR_DEPARTMENT_OTHER): Payer: Self-pay

## 2024-02-21 ENCOUNTER — Encounter: Payer: Self-pay | Admitting: Family Medicine

## 2024-02-21 ENCOUNTER — Ambulatory Visit: Admitting: Family Medicine

## 2024-02-21 VITALS — BP 124/74 | HR 71 | Temp 98.0°F | Ht 61.0 in | Wt 130.0 lb

## 2024-02-21 DIAGNOSIS — Z13820 Encounter for screening for osteoporosis: Secondary | ICD-10-CM

## 2024-02-21 DIAGNOSIS — Z780 Asymptomatic menopausal state: Secondary | ICD-10-CM | POA: Diagnosis not present

## 2024-02-21 DIAGNOSIS — I10 Essential (primary) hypertension: Secondary | ICD-10-CM | POA: Diagnosis not present

## 2024-02-21 DIAGNOSIS — F411 Generalized anxiety disorder: Secondary | ICD-10-CM

## 2024-02-21 DIAGNOSIS — E782 Mixed hyperlipidemia: Secondary | ICD-10-CM | POA: Diagnosis not present

## 2024-02-21 DIAGNOSIS — F988 Other specified behavioral and emotional disorders with onset usually occurring in childhood and adolescence: Secondary | ICD-10-CM | POA: Diagnosis not present

## 2024-02-21 DIAGNOSIS — Z0000 Encounter for general adult medical examination without abnormal findings: Secondary | ICD-10-CM

## 2024-02-21 DIAGNOSIS — E1169 Type 2 diabetes mellitus with other specified complication: Secondary | ICD-10-CM | POA: Diagnosis not present

## 2024-02-21 DIAGNOSIS — I480 Paroxysmal atrial fibrillation: Secondary | ICD-10-CM

## 2024-02-21 LAB — COMPREHENSIVE METABOLIC PANEL WITH GFR
ALT: 15 IU/L (ref 0–32)
AST: 16 IU/L (ref 0–40)
Albumin: 4.7 g/dL (ref 3.9–4.9)
Alkaline Phosphatase: 99 IU/L (ref 49–135)
BUN/Creatinine Ratio: 21 (ref 12–28)
BUN: 18 mg/dL (ref 8–27)
Bilirubin Total: 0.3 mg/dL (ref 0.0–1.2)
CO2: 26 mmol/L (ref 20–29)
Calcium: 9.6 mg/dL (ref 8.7–10.3)
Chloride: 95 mmol/L — ABNORMAL LOW (ref 96–106)
Creatinine, Ser: 0.86 mg/dL (ref 0.57–1.00)
Globulin, Total: 3.1 g/dL (ref 1.5–4.5)
Glucose: 134 mg/dL — ABNORMAL HIGH (ref 70–99)
Potassium: 4.3 mmol/L (ref 3.5–5.2)
Sodium: 139 mmol/L (ref 134–144)
Total Protein: 7.8 g/dL (ref 6.0–8.5)
eGFR: 73 mL/min/1.73 (ref >59)

## 2024-02-21 LAB — CBC WITH DIFFERENTIAL/PLATELET
Basophils Absolute: 0.1 x10E3/uL (ref 0.0–0.2)
Basos: 1 %
EOS (ABSOLUTE): 0.1 x10E3/uL (ref 0.0–0.4)
Eos: 2 %
Hematocrit: 44.5 % (ref 34.0–46.6)
Hemoglobin: 14.2 g/dL (ref 11.1–15.9)
Immature Grans (Abs): 0 x10E3/uL (ref 0.0–0.1)
Immature Granulocytes: 0 %
Lymphocytes Absolute: 1.3 x10E3/uL (ref 0.7–3.1)
Lymphs: 24 %
MCH: 29 pg (ref 26.6–33.0)
MCHC: 31.9 g/dL (ref 31.5–35.7)
MCV: 91 fL (ref 79–97)
Monocytes Absolute: 0.5 x10E3/uL (ref 0.1–0.9)
Monocytes: 8 %
Neutrophils Absolute: 3.6 x10E3/uL (ref 1.4–7.0)
Neutrophils: 65 %
Platelets: 361 x10E3/uL (ref 150–450)
RBC: 4.9 x10E6/uL (ref 3.77–5.28)
RDW: 13.2 % (ref 11.7–15.4)
WBC: 5.6 x10E3/uL (ref 3.4–10.8)

## 2024-02-21 LAB — TSH: TSH: 2.87 u[IU]/mL (ref 0.450–4.500)

## 2024-02-21 LAB — POCT LIPID PANEL
HDL: 63
LDL: 145
Non-HDL: 170
TC: 232
TRG: 122

## 2024-02-21 LAB — POCT GLYCOSYLATED HEMOGLOBIN (HGB A1C): HbA1c POC (<> result, manual entry): 6.4 % (ref 4.0–5.6)

## 2024-02-21 NOTE — Progress Notes (Unsigned)
 Subjective:  Patient ID: Lindsay Guerrero, female    DOB: 1955/09/03  Age: 68 y.o. MRN: [id number]  Chief Complaint  Patient presents with   Medical Management of Chronic Issues    HPI: Discussed the use of AI scribe software for clinical note transcription with the patient, who gave verbal consent to proceed.  History of Present Illness Lindsay Guerrero Jan is a 68 year old female who presents for a follow-up visit.  Anxiety and mood disturbance - Increased anxiety attributed to holiday stress and expectations, particularly related to Christmas preparations and travel decisions - Currently taking Trintellix , which improves mood but causes mild headaches and nausea if taken on an empty stomach - Variable dosing of Trintellix  by biting off portions of the tablet  Dyslipidemia - LDL cholesterol decreased from 203 to 145 - Making dietary improvements and increasing physical activity with walking - Occasional consumption of candy and nighttime eating, described as her worst habit  Type 2 diabetes mellitus - A1c remains stable at 6.4, consistent with previous results - A1c was 6.8 in May, attributed to dietary changes while caring for a friend - Not taking Farxiga  due to recurrent yeast infections  Medication side effects and adherence - Trintellix  causes mild headaches and nausea if taken on an empty stomach - Adderall taken sporadically, often in halves or quarters - No recent changes in medication appearance or effectiveness  Cardiac symptoms and antihypertensive use - On acebutolol  - Occasional skipped heartbeats, worsened by alcohol - Taking Lasix  20 mg once daily  Gastrointestinal symptoms - Uses omeprazole as needed for acid reflux  Bladder discomfort - Bladder pain present  Peripheral neuropathy and raynaud's phenomenon - Occasional numbness in feet, suspected to be related to Raynaud's rather than circulation issues - Cold feet and fingers, not severe  recently  Dermatologic symptoms - Uses creams with zinc and salicylic acid for skin issues on legs, including stucco keratosis and granuloma  General review of systems - No fevers, chills, sweats, earaches, sore throat, stuffy nose, chest pain, or swelling  Vitamin d  supplementation - Takes vitamin D  but feels the dose may be insufficient       02/21/2024    8:41 AM 10/22/2023    8:41 AM 07/18/2023    9:59 AM 04/18/2023    7:58 AM 11/26/2022   10:40 AM  Depression screen PHQ 2/9  Decreased Interest 0 0 0 0 1  Down, Depressed, Hopeless 0 0 1 1 0  PHQ - 2 Score 0 0 1 1 1   Altered sleeping  2 1 2 2   Tired, decreased energy  1 0 0 1  Change in appetite  0 0 0 0  Feeling bad or failure about yourself   0 0 0 0  Trouble concentrating  0 1 0 0  Moving slowly or fidgety/restless  0 0 0 0  Suicidal thoughts  0 0 0 0  PHQ-9 Score  3  3  3  4    Difficult doing work/chores   Somewhat difficult Not difficult at all Not difficult at all     Data saved with a previous flowsheet row definition        04/18/2023    7:58 AM  Fall Risk   Falls in the past year? 1  Number falls in past yr: 0  Injury with Fall? 0   Risk for fall due to : No Fall Risks     Data saved with a previous flowsheet row definition  Patient Care Team: Sherre Clapper, MD as PCP - General (Family Medicine) Bernie Lamar PARAS, MD as PCP - Cardiology (Cardiology) Inocencio Soyla Lunger, MD as PCP - Electrophysiology (Cardiology) Maryl Lynwood Raddle., OD (Optometry)   Review of Systems  Medications Ordered Prior to Encounter[1] Past Medical History:  Diagnosis Date   Adult attention deficit disorder 05/15/2013   Anxiety state 11/19/2012   ICD10   Aortic stenosis 12/13/2017   Moderate by echocardiogram from 2019 mean gradient 25   Aortic stenosis, mild 07/13/2016   Moderate by echocardiogram from 2019 mean gradient 25   Aortic stenosis, severe 07/02/2019   Asthma    mild   CHF (congestive heart failure) (HCC)     Chronic bilateral low back pain 01/27/2015   Depression    Diabetes mellitus without complication (HCC)    borderline   Dyspnea    on exertion   Dyspnea on exertion 11/01/2017   Elective surgery for purposes other than treating health conditions 09/27/2010   ICD10   Essential hypertension 06/30/2018   Family history of colon cancer 03/01/2005   Family history of premature coronary artery disease 03/01/2005   GAD (generalized anxiety disorder) 03/01/2005   Formatting of this note might be different from the original. with panic attacks   Hepatitis    due to cytomegalovirus-resolved   History of breast cancer 06/30/2018   History of hiatal hernia    mild,found on a CT   History of malignant neoplasm of breast 03/01/2005   Formatting of this note might be different from the original. node neg; see chartnotes for treatment   HTN (hypertension), benign 07/13/2016   Hyperlipidemia 06/30/2018   Hypokalemia 06/30/2018   Inflamed seborrheic keratosis 08/08/2012   Irritable bowel syndrome 03/01/2005   Malignant neoplasm of upper-inner quadrant of female breast (HCC) 2003   Primary  diagnosis   Mild intermittent asthma without complication 03/01/2005   Formatting of this note might be different from the original. mild   Mitral regurgitation 12/13/2017   Mild to moderate based on echo from 2019   Mixed hyperlipidemia 07/20/2019   NICM (nonischemic cardiomyopathy) (HCC) 11/01/2015   OSA (obstructive sleep apnea)    mild obstructive sleep apnea with an AHI of 10.9/h and O2 saturations as low as 81%.   Osteopenia 01/27/2015   Paroxysmal atrial fibrillation (HCC) 07/14/2019   Pneumonia 2018   Post menopausal problems 01/27/2015   Precordial pain 11/01/2015   Psoriatic arthritis (HCC) 04/25/2015   Recurrent genital HSV (herpes simplex virus) infection 03/01/2005   Formatting of this note might be different from the original. HSV genital   Restless legs syndrome 03/01/2005   Rosacea  03/01/2005   Routine history and physical examination of adult 09/22/2012   Formatting of this note might be different from the original. PROBLEM LIST:  1. Left node negative breast cancer 2002, stage I, grade 1, status post  lumpectomy and node dissection, external beam radiation,  chemotherapy/Cytoxan, mild left arm lymphedema with cellulitis 2004.  2. Anxiety disorder with panic attacks, family history of same, globus  pharyngeus, palpitations, atypical chest pain, str   S/P aortic valve replacement with bioprosthetic valve 21 mm Edwards INSPIRIS RESILIA  07/02/2019   Size 21 mm   Seborrheic keratoses 09/10/2016   Vaginal dryness 01/27/2015   Past Surgical History:  Procedure Laterality Date   AORTIC VALVE REPLACEMENT N/A 07/02/2019   Procedure: AORTIC VALVE REPLACEMENT (AVR) USING INSPIRIS VALVE SIZE ;  Surgeon: Lucas Dorise POUR, MD;  Location: [facility] OR;  Service: Open Heart Surgery;  Laterality: N/A;   ATRIAL FIBRILLATION ABLATION N/A 07/05/2022   Procedure: ATRIAL FIBRILLATION ABLATION;  Surgeon: Inocencio Soyla Lunger, MD;  Location: [facility] INVASIVE CV LAB;  Service: Cardiovascular;  Laterality: N/A;   BREAST LUMPECTOMY Left 2003   sentinal node bx.   CARDIOVERSION N/A 07/10/2019   Procedure: CARDIOVERSION;  Surgeon: Kate Lonni CROME, MD;  Location: [facility] ENDOSCOPY;  Service: Cardiovascular;  Laterality: N/A;   CESAREAN SECTION     CLIPPING OF ATRIAL APPENDAGE N/A 07/02/2019   Procedure: CLIPPING OF ATRIAL APPENDAGE USING PRO2 CLIP SIZE ;  Surgeon: Lucas Dorise POUR, MD;  Location: [facility] OR;  Service: Open Heart Surgery;  Laterality: N/A;   COSMETIC SURGERY     IR THORACENTESIS ASP PLEURAL SPACE W/IMG GUIDE  07/07/2019   RIGHT/LEFT HEART CATH AND CORONARY ANGIOGRAPHY N/A 05/20/2019   Procedure: RIGHT/LEFT HEART CATH AND CORONARY ANGIOGRAPHY;  Surgeon: Wonda Sharper, MD;  Location: [facility] INVASIVE CV LAB;  Service: Cardiovascular;  Laterality: N/A;   TEE WITHOUT CARDIOVERSION N/A 07/02/2019    Procedure: TRANSESOPHAGEAL ECHOCARDIOGRAM (TEE);  Surgeon: Lucas Dorise POUR, MD;  Location: [facility] OR;  Service: Open Heart Surgery;  Laterality: N/A;   TONSILLECTOMY      Family History  Problem Relation Age of Onset   Heart Problems Mother    Hypertension Mother    Heart disease Mother    Heart Problems Father    Heart disease Father    Heart disease Sister    Heart attack Sister    Blindness Daughter    Diabetes Daughter    Developmental delay Daughter    Colon cancer Maternal Grandmother    Heart disease Maternal Grandmother    AAA (abdominal aortic aneurysm) Paternal Grandfather    Social History   Socioeconomic History   Marital status: Married    Spouse name: Not on file   Number of children: 5   Years of education: Not on file   Highest education level: Associate degree: academic program  Occupational History   Occupation: retired  Tobacco Use   Smoking status: Never   Smokeless tobacco: Never  Vaping Use   Vaping status: Never Used  Substance and Sexual Activity   Alcohol use: Yes    Comment: occasional social drinking (once per month)    Drug use: Yes    Types: Marijuana    Comment: smoked college years, chew THC gummies   Sexual activity: Yes    Partners: Male    Birth control/protection: None  Other Topics Concern   Not on file  Social History Narrative   Not on file   Social Drivers of Health   Tobacco Use: Low Risk (02/21/2024)   Patient History    Smoking Tobacco Use: Never    Smokeless Tobacco Use: Never    Passive Exposure: Not on file  Financial Resource Strain: Low Risk (02/21/2024)   Overall Financial Resource Strain (CARDIA)    Difficulty of Paying Living Expenses: Not hard at all  Food Insecurity: No Food Insecurity (02/21/2024)   Epic    Worried About Radiation Protection Practitioner of Food in the Last Year: Never true    Ran Out of Food in the Last Year: Never true  Transportation Needs: No Transportation Needs (02/21/2024)   Epic    Lack of  Transportation (Medical): No    Lack of Transportation (Non-Medical): No  Physical Activity: Sufficiently Active (02/21/2024)   Exercise Vital Sign    Days of Exercise per Week: 5 days  Minutes of Exercise per Session: 40 min  Stress: Patient Declined (02/21/2024)   [employer] of Occupational Health - Occupational Stress Questionnaire    Feeling of Stress: Patient declined  Social Connections: Moderately Integrated (02/21/2024)   Social Connection and Isolation Panel    Frequency of Communication with Friends and Family: More than three times a week    Frequency of Social Gatherings with Friends and Family: More than three times a week    Attends Religious Services: Patient declined    Active Member of Clubs or Organizations: Not on file    Attends Banker Meetings: More than 4 times per year    Marital Status: Married  Depression (PHQ2-9): Low Risk (02/21/2024)   Depression (PHQ2-9)    PHQ-2 Score: 0  Alcohol Screen: Low Risk (02/21/2024)   Alcohol Screen    Last Alcohol Screening Score (AUDIT): 0  Housing: Low Risk (02/21/2024)   Epic    Unable to Pay for Housing in the Last Year: No    Number of Times Moved in the Last Year: 0    Homeless in the Last Year: No  Utilities: Not At Risk (02/21/2024)   Epic    Threatened with loss of utilities: No  Health Literacy: Adequate Health Literacy (02/21/2024)   B1300 Health Literacy    Frequency of need for help with medical instructions: Never    Objective:  BP 124/74   Pulse 71   Temp 98 F (36.7 C)   Ht 5' 1 (1.549 m)   Wt 130 lb (59 kg)   LMP  (LMP Unknown)   SpO2 99%   BMI 24.56 kg/m      02/21/2024    8:37 AM 01/08/2024    8:18 AM 11/18/2023   10:51 AM  BP/Weight  Systolic BP 124 130 124  Diastolic BP 74 80 70  Wt. (Lbs) 130 128 124.8  BMI 24.56 kg/m2 24.19 kg/m2 23.58 kg/m2    Physical Exam Vitals reviewed.  Constitutional:      Appearance: Normal appearance. She is normal weight.   Neck:     Vascular: No carotid bruit.  Cardiovascular:     Rate and Rhythm: Normal rate and regular rhythm.     Pulses: Normal pulses.     Heart sounds: Normal heart sounds.  Pulmonary:     Effort: Pulmonary effort is normal. No respiratory distress.     Breath sounds: Normal breath sounds.  Abdominal:     General: Abdomen is flat. Bowel sounds are normal.     Palpations: Abdomen is soft.     Tenderness: There is no abdominal tenderness.  Neurological:     Mental Status: She is alert and oriented to person, place, and time.  Psychiatric:        Mood and Affect: Mood normal.        Behavior: Behavior normal.     {Perform Simple Foot Exam  Perform Detailed exam:1} {Insert foot Exam (Optional):30965}   Lab Results  Component Value Date   WBC 5.6 02/21/2024   HGB 14.2 02/21/2024   HCT 44.5 02/21/2024   PLT 361 02/21/2024   GLUCOSE 134 (H) 02/21/2024   CHOL 242 (H) 07/18/2023   TRIG 91 07/18/2023   HDL 54 07/18/2023   LDLDIRECT 166 (H) 09/15/2020   LDLCALC 172 (H) 07/18/2023   ALT 15 02/21/2024   AST 16 02/21/2024   NA 139 02/21/2024   K 4.3 02/21/2024   CL 95 (L) 02/21/2024   CREATININE 0.86  02/21/2024   BUN 18 02/21/2024   CO2 26 02/21/2024   TSH 2.870 02/21/2024   INR 1.6 (H) 07/02/2019   HGBA1C 6.4 02/21/2024    Results for orders placed or performed in visit on 02/21/24  POCT glycosylated hemoglobin (Hb A1C)   Collection Time: 02/21/24  8:51 AM  Result Value Ref Range   Hemoglobin A1C     HbA1c POC (<> result, manual entry) 6.4 4.0 - 5.6 %   HbA1c, POC (prediabetic range)     HbA1c, POC (controlled diabetic range)    POCT Lipid Panel   Collection Time: 02/21/24  8:53 AM  Result Value Ref Range   TC 232    HDL 63    TRG 122    LDL 145    Non-HDL 170    TC/HDL    CBC with Differential/Platelet   Collection Time: 02/21/24  9:17 AM  Result Value Ref Range   WBC 5.6 3.4 - 10.8 x10E3/uL   RBC 4.90 3.77 - 5.28 x10E6/uL   Hemoglobin 14.2 11.1 - 15.9  g/dL   Hematocrit 55.4 65.9 - 46.6 %   MCV 91 79 - 97 fL   MCH 29.0 26.6 - 33.0 pg   MCHC 31.9 31.5 - 35.7 g/dL   RDW 86.7 88.2 - 84.5 %   Platelets 361 150 - 450 x10E3/uL   Neutrophils 65 Not Estab. %   Lymphs 24 Not Estab. %   Monocytes 8 Not Estab. %   Eos 2 Not Estab. %   Basos 1 Not Estab. %   Neutrophils Absolute 3.6 1.4 - 7.0 x10E3/uL   Lymphocytes Absolute 1.3 0.7 - 3.1 x10E3/uL   Monocytes Absolute 0.5 0.1 - 0.9 x10E3/uL   EOS (ABSOLUTE) 0.1 0.0 - 0.4 x10E3/uL   Basophils Absolute 0.1 0.0 - 0.2 x10E3/uL   Immature Granulocytes 0 Not Estab. %   Immature Grans (Abs) 0.0 0.0 - 0.1 x10E3/uL  Comprehensive metabolic panel with GFR   Collection Time: 02/21/24  9:17 AM  Result Value Ref Range   Glucose 134 (H) 70 - 99 mg/dL   BUN 18 8 - 27 mg/dL   Creatinine, Ser 9.13 0.57 - 1.00 mg/dL   eGFR 73 >40 fO/fpw/8.26   BUN/Creatinine Ratio 21 12 - 28   Sodium 139 134 - 144 mmol/L   Potassium 4.3 3.5 - 5.2 mmol/L   Chloride 95 (L) 96 - 106 mmol/L   CO2 26 20 - 29 mmol/L   Calcium 9.6 8.7 - 10.3 mg/dL   Total Protein 7.8 6.0 - 8.5 g/dL   Albumin  4.7 3.9 - 4.9 g/dL   Globulin, Total 3.1 1.5 - 4.5 g/dL   Bilirubin Total 0.3 0.0 - 1.2 mg/dL   Alkaline Phosphatase 99 49 - 135 IU/L   AST 16 0 - 40 IU/L   ALT 15 0 - 32 IU/L  TSH   Collection Time: 02/21/24  9:17 AM  Result Value Ref Range   TSH 2.870 0.450 - 4.500 uIU/mL  .  Assessment & Plan:   Assessment & Plan Combined hyperlipidemia associated with type 2 diabetes mellitus (HCC) LDL cholesterol improved from 203 to 145 without medication. Lifestyle modifications likely contributing to improvement. - Continue current lifestyle modifications.  A1c is 6.4, indicating well-managed diabetes. Occasional numbness in feet, possibly weather-related. - Checked blood count, liver, and kidney function. - Checked thyroid  function. Orders:   POCT glycosylated hemoglobin (Hb A1C)   POCT Lipid Panel   CBC with  Differential/Platelet   Comprehensive  metabolic panel with GFR   TSH  Adult attention deficit disorder Taking Adderall once a day, sometimes skipping days or taking half a dose. No recent refill since October 23rd. - Continue current Adderall regimen.    Paroxysmal atrial fibrillation (HCC) Occasional long skips in heart rhythm, possibly exacerbated by alcohol. No recent rapid heart rhythms or significant palpitations. - Continue current management.    Essential hypertension, benign Hypertension is well-controlled.  Medicines: Takes acebutolol  200 mg daily, furosemide  20 mg daily.  Continue to work on eating a healthy diet and exercise.  Labs drawn today     GAD (generalized anxiety disorder) Increased anxiety, possibly related to seasonal changes. Trintellix  is effective but causes mild headache and nausea if not eaten. - Continue Trintellix  with option to increase dose if anxiety worsens.    Adult wellness visit Routine wellness visit with discussion on lifestyle modifications and seasonal affective disorder. - Postponed Medicare annual wellness visit to November next year. - Documented tetanus vaccine received in March 2025.    Encounter for osteoporosis screening in asymptomatic postmenopausal patient  Orders:   DG BONE DENSITY (DXA); Future   Body mass index is 24.56 kg/m.   No orders of the defined types were placed in this encounter.   Orders Placed This Encounter  Procedures   DG BONE DENSITY (DXA)   CBC with Differential/Platelet   Comprehensive metabolic panel with GFR   TSH   POCT glycosylated hemoglobin (Hb A1C)   POCT Lipid Panel     I,Marla I Leal-Borjas,acting as a scribe for Abigail Free, MD.,have documented all relevant documentation on the behalf of Abigail Free, MD,as directed by  Abigail Free, MD while in the presence of Abigail Free, MD.   Follow-up: Return in about 4 months (around 06/21/2024) for chronic fasting.  An After Visit Summary was  printed and given to the patient.  Abigail Free, MD [facility] [phone]     [1]  Current Outpatient Medications on File Prior to Visit  Medication Sig Dispense Refill   acebutolol  (SECTRAL ) 200 MG capsule Take 1 capsule (200 mg total) by mouth daily. 90 capsule 1   acetaminophen  (TYLENOL ) 500 MG tablet Take 500-1,000 mg by mouth every 6 (six) hours as needed for moderate pain.     Acetylcysteine  (NAC) 600 MG CAPS Take 600-1,200 mg by mouth daily.     acyclovir  (ZOVIRAX ) 400 MG tablet Take 1 tablet (400 mg total) by mouth 5 (five) times daily. 35 tablet 2   amphetamine -dextroamphetamine  (ADDERALL) 20 MG tablet Take 1 tablet (20 mg total) by mouth daily. 30 tablet 0   bimatoprost  (LATISSE ) 0.03 % ophthalmic solution Place one drop on applicator and apply evenly along the skin of the upper eyelid at base of eyelashes once daily at bedtime; repeat procedure for second eye (use a clean applicator). 5 mL 12   dicyclomine  (BENTYL ) 10 MG capsule Take 1 capsule (10 mg total) by mouth 4 (four) times daily -  before meals and at bedtime as needed for spasms/bloating 120 capsule 0   diltiazem  (CARDIZEM ) 30 MG tablet Take 1 tablet (30 mg total) by mouth every 8 (eight) hours as needed (palpations). 60 tablet 3   diphenhydrAMINE  (BENADRYL ) 25 MG tablet Take 25 mg by mouth daily as needed for itching.     fluticasone  (FLONASE ) 50 MCG/ACT nasal spray Place 2 sprays into both nostrils daily. 16 g 6   furosemide  (LASIX ) 20 MG tablet Take 1 tablet (20 mg total) by mouth  daily. 90 tablet 1   LORazepam  (ATIVAN ) 1 MG tablet Take 1 tablet (1 mg total) by mouth every 8 (eight) hours as needed for anxiety or sleep. 90 tablet 2   Melatonin 10 MG TABS Take 3 mg by mouth at bedtime as needed (sleep).     omeprazole (PRILOSEC OTC) 20 MG tablet Take 20 mg by mouth daily as needed (acid reflux).     ondansetron  (ZOFRAN ) 4 MG tablet Take 1 tablet (4 mg total) by mouth every 8 (eight) hours as needed for  nausea or vomiting. 30 tablet 0   sodium chloride  (OCEAN) 0.65 % SOLN nasal spray Place 1 spray into both nostrils 2 (two) times daily.     triamcinolone cream (KENALOG) 0.1 % Apply 1 Application topically 2 (two) times daily as needed (psoriasis).     Vitamin D , Ergocalciferol , (DRISDOL ) 1.25 MG (50000 UNIT) CAPS capsule Take 1 capsule (50,000 Units total) by mouth every Monday. 5 capsule 2   vortioxetine  HBr (TRINTELLIX ) 10 MG TABS tablet Take 1 tablet (10 mg total) by mouth daily. 30 tablet 2   vortioxetine  HBr (TRINTELLIX ) 10 MG TABS tablet Take 1 tablet (10 mg total) by mouth daily. 30 tablet 3   No current facility-administered medications on file prior to visit.

## 2024-02-21 NOTE — Assessment & Plan Note (Addendum)
 Hypertension is well-controlled.  Medicines: Takes acebutolol  200 mg daily, furosemide  20 mg daily.  Continue to work on eating a healthy diet and exercise.  Labs drawn today

## 2024-02-21 NOTE — Assessment & Plan Note (Addendum)
 Taking Adderall once a day, sometimes skipping days or taking half a dose. No recent refill since October 23rd. - Continue current Adderall regimen.

## 2024-02-21 NOTE — Assessment & Plan Note (Addendum)
 Increased anxiety, possibly related to seasonal changes. Trintellix  is effective but causes mild headache and nausea if not eaten. - Continue Trintellix  with option to increase dose if anxiety worsens.

## 2024-02-21 NOTE — Patient Instructions (Signed)
°  VISIT SUMMARY: You had a follow-up visit to discuss several health issues, including anxiety, diabetes, cholesterol, and heart health. We reviewed your current medications and lifestyle habits, and made some recommendations to help manage your conditions.  YOUR PLAN: TYPE 2 DIABETES MELLITUS: Your A1c level is stable at 6.4, indicating well-managed diabetes. -Continue monitoring your blood sugar levels and maintain your current diet and exercise routine. -We checked your blood count, liver, and kidney function, as well as your thyroid  function.  COMBINED HYPERLIPIDEMIA: Your LDL cholesterol has improved from 203 to 145, possibly due to lifestyle changes. -Continue with your current lifestyle modifications, including a healthy diet and regular physical activity. Posey Job!  PAROXYSMAL ATRIAL FIBRILLATION: You have occasional skipped heartbeats, which may be worsened by alcohol. -Continue with your current management plan and try to limit alcohol consumption.  ADULT ATTENTION DEFICIT DISORDER: You are taking Adderall once a day, sometimes skipping days or taking half a dose. -Continue with your current Adderall regimen.  GENERALIZED ANXIETY DISORDER: You have increased anxiety, possibly related to seasonal changes. Trintellix  is effective but causes mild headache and nausea if not taken with food. -Continue taking Trintellix  and consider increasing the dose if your anxiety worsens. -Take Trintellix  with food to minimize side effects.  ADULT WELLNESS VISIT: Routine wellness visit with discussion on lifestyle modifications and seasonal affective disorder. -Your Medicare annual wellness visit is postponed to November next year.                       Contains text generated by [employer]bridge.                                 Contains text generated by [employer]bridge.

## 2024-02-21 NOTE — Assessment & Plan Note (Signed)
 SABRA

## 2024-02-21 NOTE — Assessment & Plan Note (Addendum)
 LDL cholesterol improved from 203 to 145 without medication. Lifestyle modifications likely contributing to improvement. - Continue current lifestyle modifications.  A1c is 6.4, indicating well-managed diabetes. Occasional numbness in feet, possibly weather-related. - Checked blood count, liver, and kidney function. - Checked thyroid  function. Orders:   POCT glycosylated hemoglobin (Hb A1C)   POCT Lipid Panel   CBC with Differential/Platelet   Comprehensive metabolic panel with GFR   TSH

## 2024-02-23 ENCOUNTER — Ambulatory Visit: Payer: Self-pay | Admitting: Family Medicine

## 2024-02-24 ENCOUNTER — Other Ambulatory Visit (HOSPITAL_BASED_OUTPATIENT_CLINIC_OR_DEPARTMENT_OTHER): Payer: Self-pay

## 2024-02-27 ENCOUNTER — Encounter: Payer: Self-pay | Admitting: Family Medicine

## 2024-03-07 ENCOUNTER — Other Ambulatory Visit (HOSPITAL_BASED_OUTPATIENT_CLINIC_OR_DEPARTMENT_OTHER): Payer: Self-pay

## 2024-03-07 ENCOUNTER — Other Ambulatory Visit: Payer: Self-pay | Admitting: Family Medicine

## 2024-03-09 ENCOUNTER — Other Ambulatory Visit (HOSPITAL_BASED_OUTPATIENT_CLINIC_OR_DEPARTMENT_OTHER): Payer: Self-pay

## 2024-03-09 MED ORDER — AMPHETAMINE-DEXTROAMPHETAMINE 20 MG PO TABS
20.0000 mg | ORAL_TABLET | Freq: Every day | ORAL | 0 refills | Status: AC
  Filled 2024-03-09: qty 30, 30d supply, fill #0

## 2024-03-10 ENCOUNTER — Other Ambulatory Visit (HOSPITAL_BASED_OUTPATIENT_CLINIC_OR_DEPARTMENT_OTHER): Payer: Self-pay

## 2024-03-18 ENCOUNTER — Encounter (HOSPITAL_BASED_OUTPATIENT_CLINIC_OR_DEPARTMENT_OTHER): Payer: Self-pay

## 2024-04-06 ENCOUNTER — Other Ambulatory Visit (HOSPITAL_BASED_OUTPATIENT_CLINIC_OR_DEPARTMENT_OTHER): Payer: Self-pay

## 2024-04-06 ENCOUNTER — Other Ambulatory Visit: Payer: Self-pay | Admitting: Family Medicine

## 2024-04-06 MED ORDER — ACEBUTOLOL HCL 200 MG PO CAPS
200.0000 mg | ORAL_CAPSULE | Freq: Every day | ORAL | 1 refills | Status: AC
  Filled 2024-04-06: qty 90, 90d supply, fill #0

## 2024-04-06 MED ORDER — ONDANSETRON HCL 4 MG PO TABS
4.0000 mg | ORAL_TABLET | Freq: Three times a day (TID) | ORAL | 0 refills | Status: AC | PRN
  Filled 2024-04-06: qty 30, 10d supply, fill #0

## 2024-04-07 ENCOUNTER — Other Ambulatory Visit (HOSPITAL_BASED_OUTPATIENT_CLINIC_OR_DEPARTMENT_OTHER): Payer: Self-pay

## 2024-06-22 ENCOUNTER — Ambulatory Visit: Admitting: Family Medicine
